# Patient Record
Sex: Female | Born: 1950 | Race: Black or African American | Hispanic: No | Smoking: Never smoker
Health system: Southern US, Community
[De-identification: ages and names within clinical notes are randomized; demographics above are authoritative.]

## PROBLEM LIST (undated history)

## (undated) DIAGNOSIS — I2510 Atherosclerotic heart disease of native coronary artery without angina pectoris: Secondary | ICD-10-CM

## (undated) DIAGNOSIS — M797 Fibromyalgia: Secondary | ICD-10-CM

## (undated) DIAGNOSIS — F329 Major depressive disorder, single episode, unspecified: Secondary | ICD-10-CM

## (undated) DIAGNOSIS — R42 Dizziness and giddiness: Secondary | ICD-10-CM

## (undated) DIAGNOSIS — N179 Acute kidney failure, unspecified: Secondary | ICD-10-CM

## (undated) DIAGNOSIS — E785 Hyperlipidemia, unspecified: Secondary | ICD-10-CM

## (undated) DIAGNOSIS — R569 Unspecified convulsions: Secondary | ICD-10-CM

## (undated) DIAGNOSIS — Z87442 Personal history of urinary calculi: Secondary | ICD-10-CM

## (undated) DIAGNOSIS — F32A Depression, unspecified: Secondary | ICD-10-CM

## (undated) DIAGNOSIS — J189 Pneumonia, unspecified organism: Secondary | ICD-10-CM

## (undated) DIAGNOSIS — M1990 Unspecified osteoarthritis, unspecified site: Secondary | ICD-10-CM

## (undated) DIAGNOSIS — F319 Bipolar disorder, unspecified: Secondary | ICD-10-CM

## (undated) DIAGNOSIS — I10 Essential (primary) hypertension: Secondary | ICD-10-CM

## (undated) DIAGNOSIS — G4730 Sleep apnea, unspecified: Secondary | ICD-10-CM

## (undated) DIAGNOSIS — Z86018 Personal history of other benign neoplasm: Secondary | ICD-10-CM

## (undated) DIAGNOSIS — K219 Gastro-esophageal reflux disease without esophagitis: Secondary | ICD-10-CM

## (undated) DIAGNOSIS — G43909 Migraine, unspecified, not intractable, without status migrainosus: Secondary | ICD-10-CM

## (undated) DIAGNOSIS — I739 Peripheral vascular disease, unspecified: Secondary | ICD-10-CM

## (undated) DIAGNOSIS — J45909 Unspecified asthma, uncomplicated: Secondary | ICD-10-CM

## (undated) DIAGNOSIS — C801 Malignant (primary) neoplasm, unspecified: Secondary | ICD-10-CM

## (undated) HISTORY — DX: Fibromyalgia: M79.7

## (undated) HISTORY — DX: Essential (primary) hypertension: I10

## (undated) HISTORY — DX: Depression, unspecified: F32.A

## (undated) HISTORY — DX: Hyperlipidemia, unspecified: E78.5

## (undated) HISTORY — PX: TOTAL KNEE ARTHROPLASTY: SHX125

## (undated) HISTORY — DX: Major depressive disorder, single episode, unspecified: F32.9

## (undated) HISTORY — DX: Atherosclerotic heart disease of native coronary artery without angina pectoris: I25.10

## (undated) HISTORY — PX: CORONARY ANGIOPLASTY: SHX604

## (undated) HISTORY — DX: Morbid (severe) obesity due to excess calories: E66.01

## (undated) HISTORY — DX: Unspecified asthma, uncomplicated: J45.909

## (undated) HISTORY — PX: TONSILLECTOMY: SUR1361

## (undated) HISTORY — PX: PARTIAL HYSTERECTOMY: SHX80

## (undated) HISTORY — PX: ANKLE FUSION: SHX881

## (undated) HISTORY — DX: Gastro-esophageal reflux disease without esophagitis: K21.9

## (undated) HISTORY — DX: Unspecified osteoarthritis, unspecified site: M19.90

---

## 1997-08-28 HISTORY — PX: OTHER SURGICAL HISTORY: SHX169

## 2010-05-10 DIAGNOSIS — B372 Candidiasis of skin and nail: Secondary | ICD-10-CM | POA: Insufficient documentation

## 2012-08-28 HISTORY — PX: EXCISION / CURETTAGE BONE CYST PHALANGES OF FOOT: SUR479

## 2013-06-10 LAB — HM COLONOSCOPY

## 2015-08-29 HISTORY — PX: OTHER SURGICAL HISTORY: SHX169

## 2015-09-29 LAB — HM HEPATITIS C SCREENING LAB: HM HEPATITIS C SCREENING: NEGATIVE

## 2016-07-07 DIAGNOSIS — I2510 Atherosclerotic heart disease of native coronary artery without angina pectoris: Secondary | ICD-10-CM

## 2016-07-07 HISTORY — DX: Atherosclerotic heart disease of native coronary artery without angina pectoris: I25.10

## 2016-09-25 ENCOUNTER — Encounter: Payer: Self-pay | Admitting: Family Medicine

## 2016-09-25 ENCOUNTER — Ambulatory Visit (INDEPENDENT_AMBULATORY_CARE_PROVIDER_SITE_OTHER): Payer: Medicare Other | Admitting: Family Medicine

## 2016-09-25 VITALS — BP 122/78 | HR 70 | Temp 98.2°F | Ht 66.0 in | Wt 297.8 lb

## 2016-09-25 DIAGNOSIS — G894 Chronic pain syndrome: Secondary | ICD-10-CM

## 2016-09-25 DIAGNOSIS — N3281 Overactive bladder: Secondary | ICD-10-CM

## 2016-09-25 DIAGNOSIS — D352 Benign neoplasm of pituitary gland: Secondary | ICD-10-CM | POA: Diagnosis not present

## 2016-09-25 DIAGNOSIS — I10 Essential (primary) hypertension: Secondary | ICD-10-CM | POA: Insufficient documentation

## 2016-09-25 DIAGNOSIS — R079 Chest pain, unspecified: Secondary | ICD-10-CM

## 2016-09-25 DIAGNOSIS — H547 Unspecified visual loss: Secondary | ICD-10-CM | POA: Diagnosis not present

## 2016-09-25 DIAGNOSIS — K219 Gastro-esophageal reflux disease without esophagitis: Secondary | ICD-10-CM | POA: Insufficient documentation

## 2016-09-25 DIAGNOSIS — Z86018 Personal history of other benign neoplasm: Secondary | ICD-10-CM | POA: Insufficient documentation

## 2016-09-25 DIAGNOSIS — D353 Benign neoplasm of craniopharyngeal duct: Secondary | ICD-10-CM

## 2016-09-25 DIAGNOSIS — L4050 Arthropathic psoriasis, unspecified: Secondary | ICD-10-CM

## 2016-09-25 DIAGNOSIS — Z8639 Personal history of other endocrine, nutritional and metabolic disease: Secondary | ICD-10-CM

## 2016-09-25 DIAGNOSIS — F341 Dysthymic disorder: Secondary | ICD-10-CM | POA: Insufficient documentation

## 2016-09-25 DIAGNOSIS — J4540 Moderate persistent asthma, uncomplicated: Secondary | ICD-10-CM

## 2016-09-25 DIAGNOSIS — J301 Allergic rhinitis due to pollen: Secondary | ICD-10-CM | POA: Diagnosis not present

## 2016-09-25 DIAGNOSIS — N3941 Urge incontinence: Secondary | ICD-10-CM | POA: Insufficient documentation

## 2016-09-25 MED ORDER — OXYCODONE-ACETAMINOPHEN 5-325 MG PO TABS: 1.0000 | ORAL_TABLET | Freq: Three times a day (TID) | ORAL | 0 refills | Status: DC | PRN

## 2016-09-25 MED ORDER — MORPHINE SULFATE ER 15 MG PO TBCR: 15.0000 mg | EXTENDED_RELEASE_TABLET | Freq: Three times a day (TID) | ORAL | 0 refills | Status: DC

## 2016-09-25 NOTE — Progress Notes (Signed)
Pre visit review using our clinic review tool, if applicable. No additional management support is needed unless otherwise documented below in the visit note. 

## 2016-09-25 NOTE — Assessment & Plan Note (Signed)
Previously followed by Neurology. Needs new referral. Due for MRI per patient. Hx of frequent headaches secondary to above.

## 2016-09-25 NOTE — Progress Notes (Signed)
Al[place] [name] is a 66 y.o. female is here to [facility].   History of Present Illness:   Patient recently moved from [name] to live with her youngest daughter, rather than go to ALF. She has basic records with her today, including a MAR, and DC Summary from a hospital stay in 12/18. She states that she needs all of her medications refilled, along with multiple referrals completed.  She has been doing well since living with her daughter and is optimistic that it was a good move.    1. Chest pain, atypical. Intermittent. Anterior, without radiation. Not exertional. Patient does endorse SOB with exertion and brings in request for Cardiac Rehab referral. States that she has been diagnosed with "mini heart attacks." No cardiologist.    2. Chronic pain syndrome    3. Morbid obesity ([place])    4. 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 Followed by Neurology. Due for repeat MRI per patient.    5. Essential hypertension    6. Arthritis with psoriasis ([name])    7. Vision impairment    8. Dysthymia    9. Chronic seasonal allergic rhinitis due to pollen    10. Moderate persistent asthma without complication    11. Gastroesophageal reflux disease without esophagitis    12. OAB (overactive bladder)       PMHx, SurgHx, SocialHx, Medications, and Allergies were reviewed in the Visit Navigator and updated as appropriate.    Past Medical History:  Diagnosis Date  . Allergy   . Arthritis   . Asthma   . Depression   . GERD (gastroesophageal reflux disease)   . Hyperlipidemia   . Hypertension     No past surgical history on file.  No family history on file.  Social History  Substance Use Topics  . Smoking status: Never Smoker  . Smokeless tobacco: Never Used  . Alcohol use Yes     Comment: Socially. [name].     Current Medications and Allergies:    Current Outpatient Prescriptions:  .  albuterol (PROVENTIL  HFA;VENTOLIN HFA) 108 (90 Base) MCG/ACT inhaler, Inhale 2 puffs into the lungs every 6 (six) hours as needed for wheezing or shortness of breath., Disp: , Rfl:  .  amLODipine (NORVASC) 10 MG tablet, Take 10 mg by mouth daily., Disp: , Rfl:  .  ammonium lactate (LAC-HYDRIN) 12 % lotion, Apply 1 application topically as needed for dry skin., Disp: , Rfl:  .  aspirin EC 81 MG tablet, Take 81 mg by mouth daily., Disp: , Rfl:  .  budesonide-formoterol (SYMBICORT) 160-4.5 MCG/ACT inhaler, Inhale 2 puffs into the lungs 2 (two) times daily., Disp: , Rfl:  .  Cholecalciferol 2000 units CAPS, Take by mouth., Disp: , Rfl:  .  Diclofenac Sodium 1.5 % SOLN, Place onto the skin., Disp: , Rfl:  .  furosemide (LASIX) 20 MG tablet, Take 20 mg by mouth daily., Disp: , Rfl:  .  gabapentin (NEURONTIN) 600 MG tablet, Take 600 mg by mouth 3 (three) times daily., Disp: , Rfl:  .  guaiFENesin (MUCINEX) 600 MG 12 hr tablet, Take 600 mg by mouth 2 (two) times daily., Disp: , Rfl:  .  leflunomide (ARAVA) 20 MG tablet, Take 20 mg by mouth daily., Disp: , Rfl:  .  lisinopril (PRINIVIL,ZESTRIL) 40 MG tablet, Take 40 mg by mouth daily., Disp: , Rfl:  .  loratadine (CLARITIN) 10 MG tablet, Take 10 mg by mouth daily., Disp: ,  Rfl:  .  meloxicam (MOBIC) 15 MG tablet, Take 15 mg by mouth daily., Disp: , Rfl:  .  metoprolol succinate (TOPROL-XL) 50 MG 24 hr tablet, Take 50 mg by mouth daily. Take with or immediately following a meal., Disp: , Rfl:  .  mirabegron ER (MYRBETRIQ) 25 MG TB24 tablet, Take 25 mg by mouth daily., Disp: , Rfl:  .  morphine (MS CONTIN) 15 MG 12 hr tablet, Take 1 tablet (15 mg total) by mouth 3 (three) times daily., Disp: 90 tablet, Rfl: 0 .  oxyCODONE-acetaminophen (ROXICET) 5-325 MG tablet, Take 1 tablet by mouth every 8 (eight) hours as needed for severe pain., Disp: 90 tablet, Rfl: 0 .  predniSONE (DELTASONE) 5 MG tablet, Take 5 mg by mouth daily with breakfast., Disp: , Rfl:  .  sertraline (ZOLOFT) 25 MG  tablet, Take 25 mg by mouth daily., Disp: , Rfl:  .  ticagrelor (BRILINTA) 90 MG TABS tablet, Take by mouth 2 (two) times daily., Disp: , Rfl:  .  triamcinolone ointment (KENALOG) 0.1 %, Apply 1 application topically 2 (two) times daily., Disp: , Rfl:    Allergies  Allergen Reactions  . Ampicillin Nausea Only  . Asa [Aspirin] Diarrhea  . Darvon [Propoxyphene] Nausea And Vomiting  . Salicylates Other (See Comments)    Unknown   . Tetracyclines & Related Other (See Comments)    Nerves feeling       Patient Information Form: Screening and ROS     Do you feel safe in relationships? yes PHQ-2: negative  Review of Systems  General:  Negative for nexplained weight loss, fever Skin: Negative for new or changing mole, sore that won't heal HEENT: Negative for trouble hearing, trouble seeing, ringing in ears, mouth sores, hoarseness, change in voice, dysphagia CV:  Negative for chest pain, dyspnea, edema, palpitations Resp: Negative for cough, dyspnea, hemoptysis GI: Negative for nausea, vomiting, diarrhea, constipation, abdominal pain, melena, hematochezia GU: Negative for dysuria, incontinence, urinary hesitance, hematuria, vaginal or penile discharge, polyuria, sexual difficulty, lumps in testicle or breasts MSK: Negative for muscle cramps, joint swelling Neuro: Negative for weakness, numbness, dizziness, passing out/fainting Psych: Negative for depression, anxiety, memory problems   Vitals:   Vitals:   09/25/16 0751  BP: 122/78  Pulse: 70  Temp: 98.2 F (36.8 C)  TempSrc: Oral  SpO2: 94%  Weight: 297 lb 12.8 oz (135.1 kg)  Height: 5\' 6"  (1.676 m)     Body mass index is 48.07 kg/m.   Physical Exam:    General: Alert, cooperative, appears younger than stated age and no distress.  HEENT:  Normocephalic, without obvious abnormality, atraumatic. Conjunctivae/corneas clear. PERRL, EOM's intact. Normal TM's and external ear canals both ears. Nares normal. Septum  midline. Mucosa normal. No drainage or sinus tenderness. Lips, mucosa, and tongue normal; teeth and gums normal.  Lungs: Clear to auscultation bilaterally.  Heart:: Regular rate and rhythm, S1, S2 normal, no murmur, click, rub or gallop.  Abdomen: Soft, non-tender; bowel sounds normal; no masses,  no organomegaly.  Extremities: Extremities normal, atraumatic, no cyanosis or edema.  Pulses: 2+ and symmetric.  Skin: Skin color, texture, turgor normal. No rashes or lesions.  Neurologic: Alert and oriented X 3, normal strength and tone. Normal symmetric. reflexes. Normal coordination and gait.  Psych: Alert,oriented, in NAD with a full range of affect, normal behavior and no psychotic features       Assessment and Plan:    Taea was seen today for establish care.  Diagnoses  and all orders for this visit:  Chest pain, unspecified type Comments: EKG reassuring. Patient taking appropriate medications. Will refer to Cardiac Rehab and Cardiology once records reviewed.  Orders: -     EKG 12-Lead  Chronic pain syndrome Comments: Okay refill today as patient presents with records.  Orders: -     oxyCODONE-acetaminophen (ROXICET) 5-325 MG tablet; Take 1 tablet by mouth every 8 (eight) hours as needed for severe pain. -     morphine (MS CONTIN) 15 MG 12 hr tablet; Take 1 tablet (15 mg total) by mouth 3 (three) times daily.  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 Orders: -     Ambulatory referral to Neurology  Essential hypertension  Arthritis with psoriasis [facility]) Comments: Referral to Rheumatology. Orders: -     Ambulatory referral to Rheumatology  Vision impairment  Dysthymia  Chronic seasonal allergic rhinitis due to pollen  Moderate persistent asthma without complication  Gastroesophageal reflux disease without esophagitis  OAB (overactive bladder)   . Reviewed expectations re:  course of current medical issues. . Discussed self-management of symptoms. . Outlined signs and symptoms indicating need for more acute intervention. . Patient verbalized understanding and all questions were answered. . See orders for this visit as documented in the electronic medical record. . Patient received an After Visit Summary.   Records requested if needed. I spent 60 minutes with this patient, greater than 50% was face-to-face time counseling regarding the above diagnoses.     Briscoe Deutscher, [place], [place] 09/25/2016   Follow-up: No Follow-up on file.  Meds ordered this encounter  Medications  . oxyCODONE-acetaminophen (ROXICET) 5-325 MG tablet    Sig: Take 1 tablet by mouth every 8 (eight) hours as needed for severe pain.    Dispense:  90 tablet    Refill:  0  . morphine (MS CONTIN) 15 MG 12 hr tablet    Sig: Take 1 tablet (15 mg total) by mouth 3 (three) times daily.    Dispense:  90 tablet    Refill:  0  . guaiFENesin (MUCINEX) 600 MG 12 hr tablet    Sig: Take 600 mg by mouth 2 (two) times daily.  . sertraline (ZOLOFT) 25 MG tablet    Sig: Take 25 mg by mouth daily.  Marland Kitchen loratadine (CLARITIN) 10 MG tablet    Sig: Take 10 mg by mouth daily.  Marland Kitchen triamcinolone ointment (KENALOG) 0.1 %    Sig: Apply 1 application topically 2 (two) times daily.  . meloxicam (MOBIC) 15 MG tablet    Sig: Take 15 mg by mouth daily.  Marland Kitchen leflunomide (ARAVA) 20 MG tablet    Sig: Take 20 mg by mouth daily.  Marland Kitchen gabapentin (NEURONTIN) 600 MG tablet    Sig: Take 600 mg by mouth 3 (three) times daily.  . furosemide (LASIX) 20 MG tablet    Sig: Take 20 mg by mouth daily.  . mirabegron ER (MYRBETRIQ) 25 MG TB24 tablet    Sig: Take 25 mg by mouth daily.  . budesonide-formoterol (SYMBICORT) 160-4.5 MCG/ACT inhaler    Sig: Inhale 2 puffs into the lungs 2 (two) times daily.  Marland Kitchen ammonium lactate (LAC-HYDRIN) 12 % lotion    Sig: Apply 1 application topically as needed for dry skin.  .  Cholecalciferol 2000 units CAPS    Sig: Take by mouth.  Marland Kitchen lisinopril (PRINIVIL,ZESTRIL) 40 MG tablet    Sig: Take 40 mg by  mouth daily.  . ticagrelor (BRILINTA) 90 MG TABS tablet    Sig: Take by mouth 2 (two) times daily.  Marland Kitchen amLODipine (NORVASC) 10 MG tablet    Sig: Take 10 mg by mouth daily.  . metoprolol succinate (TOPROL-XL) 50 MG 24 hr tablet    Sig: Take 50 mg by mouth daily. Take with or immediately following a meal.  . aspirin EC 81 MG tablet    Sig: Take 81 mg by mouth daily.  . Diclofenac Sodium 1.5 % SOLN    Sig: Place onto the skin.  . predniSONE (DELTASONE) 5 MG tablet    Sig: Take 5 mg by mouth daily with breakfast.  . albuterol (PROVENTIL HFA;VENTOLIN HFA) 108 (90 Base) MCG/ACT inhaler    Sig: Inhale 2 puffs into the lungs every 6 (six) hours as needed for wheezing or shortness of breath.   There are no discontinued medications. Orders Placed This Encounter  Procedures  . Ambulatory referral to Neurology  . Ambulatory referral to Rheumatology  . EKG 12-Lead

## 2016-09-28 ENCOUNTER — Telehealth: Payer: Self-pay | Admitting: Family Medicine

## 2016-09-28 NOTE — Telephone Encounter (Signed)
Spoke with patient and advised that referral has been placed.

## 2016-09-28 NOTE — Telephone Encounter (Signed)
Did you place referral for Cardiology?

## 2016-09-28 NOTE — Addendum Note (Signed)
Addended by: Briscoe Deutscher R on: 09/28/2016 11:55 AM   Modules accepted: Orders

## 2016-09-28 NOTE — Telephone Encounter (Signed)
Patient called to ask if the cardiac referral had been placed? I did not see one in the referrals tab. Please call patient to follow up on referral, she seemed anxious. She did not have a doctor in mind "she trusts Juleen China with choosing who and where."   Thank you!

## 2016-09-28 NOTE — Telephone Encounter (Signed)
I was hoping to have records before sending, but realize now that they may take too long. Referral in.

## 2016-09-28 NOTE — Addendum Note (Signed)
Addended by: Briscoe Deutscher R on: 09/28/2016 11:52 AM   Modules accepted: Orders

## 2016-10-02 ENCOUNTER — Encounter: Payer: Self-pay | Admitting: Surgical

## 2016-10-03 ENCOUNTER — Encounter: Payer: Self-pay | Admitting: Cardiology

## 2016-10-03 ENCOUNTER — Ambulatory Visit (INDEPENDENT_AMBULATORY_CARE_PROVIDER_SITE_OTHER): Payer: Medicare Other | Admitting: Cardiology

## 2016-10-03 VITALS — BP 122/65 | HR 67 | Ht 66.0 in | Wt 308.6 lb

## 2016-10-03 DIAGNOSIS — I209 Angina pectoris, unspecified: Secondary | ICD-10-CM | POA: Diagnosis not present

## 2016-10-03 DIAGNOSIS — I10 Essential (primary) hypertension: Secondary | ICD-10-CM

## 2016-10-03 DIAGNOSIS — I25119 Atherosclerotic heart disease of native coronary artery with unspecified angina pectoris: Secondary | ICD-10-CM

## 2016-10-03 MED ORDER — ISOSORBIDE MONONITRATE ER 30 MG PO TB24: 30.0000 mg | ORAL_TABLET | Freq: Every day | ORAL | 3 refills | Status: DC

## 2016-10-03 MED ORDER — NITROGLYCERIN 0.4 MG SL SUBL: 0.4000 mg | SUBLINGUAL_TABLET | SUBLINGUAL | 3 refills | Status: DC | PRN

## 2016-10-03 NOTE — Progress Notes (Signed)
Cardiology Office Note   Date:  10/04/2016   ID:  Alisha [name], DOB 1950-09-16, MRN [id number]  PCP:  Alisha Deutscher, DO  Cardiologist:   Alisha Breeding, MD  Referring:  Alisha Deutscher, DO  Chief Complaint  Patient presents with  . Chest Pain      History of Present Illness: Alisha [name] is a 66 y.o. female who presents for evaluation of coronary disease. She is just moved here from [place]. She reports that in late October she presented with chest pain and received 2 stents. Unfortunately I don't have any of these records. She says that over the last few weeks she has had some arm discomfort. This is on the right arm and right sided. This happens at rest. She doesn't think it's similar to her fibromyalgia but she has a hard time telling the difference sometimes. She thinks it might be somewhat similar to some of the discomfort and related to her heart. She says it happens at rest because she's relatively sedentary. She can't make it happen. He goes away after several minutes on its own. She doesn't take anything for it. She might have some associated shortness of breath. It is moderate in intensity. She says it happens sporadically and not daily and she thinks it is a stable pattern over the last few weeks. She denies any PND or orthopnea. She said the palpitations, presyncope or syncope. She is limited by back pain.  Past Medical History:  Diagnosis Date  . Arthritis   . Asthma   . CAD (coronary artery disease)   . Depression   . GERD (gastroesophageal reflux disease)   . Hyperlipidemia   . Hypertension     Past Surgical History:  Procedure Laterality Date  . carpel tunnel release Bilateral 2017  . EXCISION / CURETTAGE BONE CYST PHALANGES OF FOOT  2014   Removal of foot cyst   . PARTIAL HYSTERECTOMY  unknown   Patient still has ovaries  . pituitary tumor removal  1999  . TONSILLECTOMY    . TOTAL KNEE ARTHROPLASTY     TKR X 3     Current Outpatient Prescriptions    Medication Sig Dispense Refill  . albuterol (PROVENTIL HFA;VENTOLIN HFA) 108 (90 Base) MCG/ACT inhaler Inhale 2 puffs into the lungs every 6 (six) hours as needed for wheezing or shortness of breath.    Marland Kitchen amLODipine (NORVASC) 10 MG tablet Take 10 mg by mouth daily.    Marland Kitchen ammonium lactate (LAC-HYDRIN) 12 % lotion Apply 1 application topically as needed for dry skin.    Marland Kitchen aspirin EC 81 MG tablet Take 81 mg by mouth daily.    . budesonide-formoterol (SYMBICORT) 160-4.5 MCG/ACT inhaler Inhale 2 puffs into the lungs 2 (two) times daily.    . Cholecalciferol 2000 units CAPS Take by mouth.    . Diclofenac Sodium 1.5 % SOLN Place onto the skin.    . furosemide (LASIX) 20 MG tablet Take 20 mg by mouth daily.    Marland Kitchen gabapentin (NEURONTIN) 600 MG tablet Take 600 mg by mouth 3 (three) times daily.    Marland Kitchen guaiFENesin (MUCINEX) 600 MG 12 hr tablet Take 600 mg by mouth 2 (two) times daily.    Marland Kitchen leflunomide (ARAVA) 20 MG tablet Take 20 mg by mouth daily.    Marland Kitchen lisinopril (PRINIVIL,ZESTRIL) 40 MG tablet Take 40 mg by mouth daily.    Marland Kitchen loratadine (CLARITIN) 10 MG tablet Take 10 mg by mouth daily.    . meloxicam (MOBIC)  15 MG tablet Take 15 mg by mouth daily.    . metoprolol succinate (TOPROL-XL) 50 MG 24 hr tablet Take 50 mg by mouth daily. Take with or immediately following a meal.    . mirabegron ER (MYRBETRIQ) 25 MG TB24 tablet Take 25 mg by mouth daily.    Marland Kitchen morphine (MS CONTIN) 15 MG 12 hr tablet Take 1 tablet (15 mg total) by mouth 3 (three) times daily. 90 tablet 0  . oxyCODONE-acetaminophen (ROXICET) 5-325 MG tablet Take 1 tablet by mouth every 8 (eight) hours as needed for severe pain. 90 tablet 0  . sertraline (ZOLOFT) 25 MG tablet Take 25 mg by mouth daily.    . ticagrelor (BRILINTA) 90 MG TABS tablet Take by mouth 2 (two) times daily.    Marland Kitchen triamcinolone ointment (KENALOG) 0.1 % Apply 1 application topically 2 (two) times daily.    . isosorbide mononitrate (IMDUR) 30 MG 24 hr tablet Take 1 tablet (30 mg  total) by mouth daily. 90 tablet 3  . nitroGLYCERIN (NITROSTAT) 0.4 MG SL tablet Place 1 tablet (0.4 mg total) under the tongue every 5 (five) minutes as needed for chest pain. 25 tablet 3   No current facility-administered medications for this visit.     Allergies:   Ampicillin; Asa [aspirin]; Darvon [propoxyphene]; Salicylates; and Tetracyclines & related    Social History:  The patient  reports that she has never smoked. She has never used smokeless tobacco. She reports that she drinks alcohol. She reports that she does not use drugs.   Family History:  The patient's family history includes Cancer in her father; Heart disease in her sister; Heart disease (age of onset: 83) in her mother.    ROS:  Please see the history of present illness.   Otherwise, review of systems are positive for vertigo.   All other systems are reviewed and negative.    PHYSICAL EXAM: VS:  BP 122/65   Pulse 67   Ht 5\' 6"  (1.676 m)   Wt (!) 308 lb 9.6 oz (140 kg)   SpO2 98%   BMI 49.81 kg/m  , BMI Body mass index is 49.81 kg/m. GENERAL:  Well appearing HEENT:  Pupils equal round and reactive, fundi not visualized, oral mucosa unremarkable, few bottom teeth NECK:  No jugular venous distention, waveform within normal limits, carotid upstroke brisk and symmetric, no bruits, no thyromegaly LYMPHATICS:  No cervical, inguinal adenopathy LUNGS:  Clear to auscultation bilaterally BACK:  No CVA tenderness CHEST:  Unremarkable HEART:  PMI not displaced or sustained,S1 and S2 within normal limits, no S3, no S4, no clicks, no rubs, no murmurs ABD:  Flat, positive bowel sounds normal in frequency in pitch, no bruits, no rebound, no guarding, no midline pulsatile mass, no hepatomegaly, no splenomegaly EXT:  2 plus pulses throughout, mild edema edema, no cyanosis no clubbing SKIN:  No rashes no nodules NEURO:  Cranial nerves II through XII grossly intact, motor grossly intact throughout PSYCH:  Cognitively intact,  oriented to person place and time    EKG:  EKG is ordered today. The ekg ordered today demonstrates sinus rhythm, rate 67, axis within normal limits, poor anterior R-wave progression, no acute ST-T wave changes.   Recent Labs: No results found for requested labs within last 8760 hours.    Lipid Panel No results found for: CHOL, TRIG, HDL, CHOLHDL, VLDL, LDLCALC, LDLDIRECT    Wt Readings from Last 3 Encounters:  10/03/16 (!) 308 lb 9.6 oz (140 kg)  09/25/16 297 lb 12.8 oz (135.1 kg)      Other studies Reviewed: Additional studies/ records that were reviewed today include: None. Review of the above records demonstrates:  Please see elsewhere in the note.     ASSESSMENT AND PLAN:  CAD:   The patient has coronary disease but I need to get the records. She is on appropriate medications at this point. I'm going to start Imdur although I'm not convinced that her pain is coming from her heart. She was also given sublingual nitroglycerin and instruction on the use of this as well as potential need to present to the emergency room should she have any increasing symptoms. Further evaluation will be based on ongoing symptoms and review her records. Of note she wants to be referred to cardiac rehabilitation.    HTN:  The blood pressure is at target. No change in medications is indicated. We will continue with therapeutic lifestyle changes (TLC).  HYPERLIPIDEMIA:  I do not have recent lipids. She is not on a statin. Again I review the records and likely will start a statin.     Current medicines are reviewed at length with the patient today.  The patient does not have concerns regarding medicines.  The following changes have been made:  As above  Labs/ tests ordered today include:   Orders Placed This Encounter  Procedures  . AMB referral to cardiac rehabilitation  . EKG 12-Lead     Disposition:   FU with me or APP in one week.     Signed, Alisha Breeding, MD  10/04/2016 1:20 PM     [facility]

## 2016-10-03 NOTE — Patient Instructions (Addendum)
Medication Instructions:  START- Imdur 30 mg daily and take Nitroglycerin as needed for chest pain not exceeding three tablets daily  Labwork: None Ordered  Testing/Procedures: None Ordered  Follow-Up:  You have been referred to Cardiac Rehabilitation  Your physician recommends that you schedule a follow-up appointment in: 1 Week with APP or Dr Percival Spanish   Any Other Special Instructions Will Be Listed Below (If Applicable).   If you need a refill on your cardiac medications before your next appointment, please call your pharmacy.

## 2016-10-04 ENCOUNTER — Encounter: Payer: Self-pay | Admitting: Cardiology

## 2016-10-04 ENCOUNTER — Encounter: Payer: Self-pay | Admitting: Family Medicine

## 2016-10-04 DIAGNOSIS — K089 Disorder of teeth and supporting structures, unspecified: Secondary | ICD-10-CM

## 2016-10-04 DIAGNOSIS — Z87442 Personal history of urinary calculi: Secondary | ICD-10-CM

## 2016-10-04 DIAGNOSIS — Z86718 Personal history of other venous thrombosis and embolism: Secondary | ICD-10-CM

## 2016-10-04 DIAGNOSIS — G894 Chronic pain syndrome: Secondary | ICD-10-CM

## 2016-10-04 DIAGNOSIS — G4733 Obstructive sleep apnea (adult) (pediatric): Secondary | ICD-10-CM

## 2016-10-04 DIAGNOSIS — I878 Other specified disorders of veins: Secondary | ICD-10-CM | POA: Insufficient documentation

## 2016-10-04 DIAGNOSIS — Z872 Personal history of diseases of the skin and subcutaneous tissue: Secondary | ICD-10-CM

## 2016-10-04 DIAGNOSIS — F319 Bipolar disorder, unspecified: Secondary | ICD-10-CM

## 2016-10-06 ENCOUNTER — Telehealth: Payer: Self-pay | Admitting: Family Medicine

## 2016-10-06 ENCOUNTER — Telehealth: Payer: Self-pay | Admitting: Cardiology

## 2016-10-06 MED ORDER — FUROSEMIDE 20 MG PO TABS: 20.0000 mg | ORAL_TABLET | Freq: Every day | ORAL | 3 refills | Status: DC

## 2016-10-06 MED ORDER — GABAPENTIN 600 MG PO TABS: 600.0000 mg | ORAL_TABLET | Freq: Three times a day (TID) | ORAL | 3 refills | Status: DC

## 2016-10-06 MED ORDER — TICAGRELOR 90 MG PO TABS: 90.0000 mg | ORAL_TABLET | Freq: Two times a day (BID) | ORAL | 1 refills | Status: DC

## 2016-10-06 MED ORDER — ASPIRIN EC 81 MG PO TBEC: 81.0000 mg | DELAYED_RELEASE_TABLET | Freq: Every day | ORAL | 6 refills | Status: DC

## 2016-10-06 MED ORDER — SERTRALINE HCL 25 MG PO TABS: 25.0000 mg | ORAL_TABLET | Freq: Every day | ORAL | 2 refills | Status: DC

## 2016-10-06 MED ORDER — METOPROLOL SUCCINATE ER 50 MG PO TB24: 50.0000 mg | ORAL_TABLET | Freq: Every day | ORAL | 3 refills | Status: DC

## 2016-10-06 MED ORDER — MIRABEGRON ER 25 MG PO TB24: 25.0000 mg | ORAL_TABLET | Freq: Every day | ORAL | 2 refills | Status: DC

## 2016-10-06 MED ORDER — AMMONIUM LACTATE 12 % EX LOTN: 1.0000 "application " | TOPICAL_LOTION | CUTANEOUS | 1 refills | Status: DC | PRN

## 2016-10-06 MED ORDER — BUDESONIDE-FORMOTEROL FUMARATE 160-4.5 MCG/ACT IN AERO: 2.0000 | INHALATION_SPRAY | Freq: Two times a day (BID) | RESPIRATORY_TRACT | 6 refills | Status: DC

## 2016-10-06 MED ORDER — LISINOPRIL 40 MG PO TABS: 40.0000 mg | ORAL_TABLET | Freq: Every day | ORAL | 6 refills | Status: DC

## 2016-10-06 MED ORDER — LEFLUNOMIDE 20 MG PO TABS: 20.0000 mg | ORAL_TABLET | Freq: Every day | ORAL | 0 refills | Status: DC

## 2016-10-06 MED ORDER — AMLODIPINE BESYLATE 10 MG PO TABS: 10.0000 mg | ORAL_TABLET | Freq: Every day | ORAL | 6 refills | Status: DC

## 2016-10-06 MED ORDER — LORATADINE 10 MG PO TABS: 10.0000 mg | ORAL_TABLET | Freq: Every day | ORAL | 6 refills | Status: DC

## 2016-10-06 MED ORDER — ALBUTEROL SULFATE HFA 108 (90 BASE) MCG/ACT IN AERS: 2.0000 | INHALATION_SPRAY | Freq: Four times a day (QID) | RESPIRATORY_TRACT | 6 refills | Status: DC | PRN

## 2016-10-06 MED ORDER — MELOXICAM 15 MG PO TABS: 15.0000 mg | ORAL_TABLET | Freq: Every day | ORAL | 3 refills | Status: DC

## 2016-10-06 NOTE — Telephone Encounter (Signed)
New Message ° °Pt voiced wanting to speak with nurse. ° °Please f/u °

## 2016-10-06 NOTE — Telephone Encounter (Signed)
RX faxed to pharmacy.

## 2016-10-06 NOTE — Telephone Encounter (Signed)
Yes, we need to refill all medications except narcotics. Per protocol okay.

## 2016-10-06 NOTE — Telephone Encounter (Signed)
Returned call to patient-patient wondering if appt has been made for cardiac rehab yet.  Advised that Cardiac rehab should reach out to her soon and get her appointment set up.  Patient requesting # to call in case she doesn't hear from them.  # provided.  Pt aware and verbalized understanding.   Also verified appt for 2/14 at 9:30 with Ignacia Bayley NP

## 2016-10-06 NOTE — Telephone Encounter (Signed)
Patient called to let Dr. Juleen China know the pharmacy number for her medication.  [facility] on [address].  [phone]  Please call patient if you have any questions.   Thank you!

## 2016-10-06 NOTE — Telephone Encounter (Signed)
Ms Train called back with pharmacy. Do we need to send in any medications?

## 2016-10-06 NOTE — Addendum Note (Signed)
Addended by: Durwin Glaze on: 10/06/2016 03:37 PM   Modules accepted: Orders

## 2016-10-11 ENCOUNTER — Ambulatory Visit (INDEPENDENT_AMBULATORY_CARE_PROVIDER_SITE_OTHER): Payer: Medicare Other | Admitting: Nurse Practitioner

## 2016-10-11 ENCOUNTER — Encounter: Payer: Self-pay | Admitting: Nurse Practitioner

## 2016-10-11 ENCOUNTER — Encounter: Payer: Self-pay | Admitting: Internal Medicine

## 2016-10-11 VITALS — BP 145/78 | HR 65 | Ht 66.0 in | Wt 307.8 lb

## 2016-10-11 DIAGNOSIS — I10 Essential (primary) hypertension: Secondary | ICD-10-CM | POA: Diagnosis not present

## 2016-10-11 DIAGNOSIS — Z79899 Other long term (current) drug therapy: Secondary | ICD-10-CM | POA: Diagnosis not present

## 2016-10-11 DIAGNOSIS — I739 Peripheral vascular disease, unspecified: Secondary | ICD-10-CM

## 2016-10-11 DIAGNOSIS — I25119 Atherosclerotic heart disease of native coronary artery with unspecified angina pectoris: Secondary | ICD-10-CM

## 2016-10-11 DIAGNOSIS — R600 Localized edema: Secondary | ICD-10-CM | POA: Diagnosis not present

## 2016-10-11 DIAGNOSIS — I209 Angina pectoris, unspecified: Secondary | ICD-10-CM

## 2016-10-11 MED ORDER — FUROSEMIDE 20 MG PO TABS: 40.0000 mg | ORAL_TABLET | Freq: Every day | ORAL | 3 refills | Status: DC

## 2016-10-11 NOTE — Progress Notes (Signed)
Office Visit    Patient Name: Alisha [name] Date of Encounter: 10/11/2016  Primary Care Provider:  Briscoe Deutscher, DO Primary Cardiologist:  Lenna Sciara. Hochrein, MD   Chief Complaint    66 year old female with a history of CAD, hypertension, hyperlipidemia, obesity, and fibromyalgia, who presents for follow-up related to chest and arm pain.  Past Medical History    Past Medical History:  Diagnosis Date  . Arthritis   . Asthma   . CAD (coronary artery disease)    a. 05/2016 s/p stenting x 2 in [place] (records not available).  . Depression   . Fibromyalgia   . GERD (gastroesophageal reflux disease)   . Hyperlipidemia   . Hypertension   . Morbid obesity ([place])    Past Surgical History:  Procedure Laterality Date  . carpel tunnel release Bilateral 2017  . EXCISION / CURETTAGE BONE CYST PHALANGES OF FOOT  2014   Removal of foot cyst   . PARTIAL HYSTERECTOMY  unknown   Patient still has ovaries  . pituitary tumor removal  1999  . TONSILLECTOMY    . TOTAL KNEE ARTHROPLASTY     TKR X 3    Allergies  Allergies  Allergen Reactions  . Ampicillin Nausea Only  . Asa [Aspirin] Diarrhea  . Darvon [Propoxyphene] Nausea And Vomiting  . Salicylates Other (See Comments)    Unknown   . Tetracyclines & Related Other (See Comments)    Nerves feeling     History of Present Illness    66 year old female with the above complex past medical history. She reports a long history of chronic, somewhat diffuse pain in the setting of fibromyalgia and arthritis. In late October 2017, she had chest pain and was hospitalized in [place]. She reports stenting 2. Those records are not available to [place]. She has since moved to [facility] and saw Dr. Percival Spanish on February 6, in order to establish care. At the time, she reported intermittent right arm and left chest discomfort. She says today that that discomfort was occurring when she would use her upper body. It wasn't clear if symptoms were anginal or  not and she was placed on isosorbide mononitrate. She says that since that addition, she has noticed less frequent episodes of arm and chest pain. Of note, anginal equivalent was substernal chest heaviness associated with dyspnea. She has had nothing like that since her stents were placed in late October/early November. She has some degree of chronic dyspnea on exertion in the setting of obesity and sedentary lifestyle. She is interested in cardiac rehabilitation and referral was made at the time of her last visit. She also has chronic lower extremity swelling, which she feels is slightly worse over the past few weeks. She does spend some part of her day with her legs in a dependent position. Since moving with her daughter, her sodium intake has dropped significantly. She denies PND, orthopnea, dizziness, syncope, or early satiety.  She did mention today, that she has been having some cramping of bilateral calves with ambulation. She does not believe she has ever been tested for PAD before.  Home Medications    Prior to Admission medications   Medication Sig Start Date End Date Taking? Authorizing Provider  albuterol (PROVENTIL HFA;VENTOLIN HFA) 108 (90 Base) MCG/ACT inhaler Inhale 2 puffs into the lungs every 6 (six) hours as needed for wheezing or shortness of breath. 10/06/16  Yes Briscoe Deutscher, DO  amLODipine (NORVASC) 10 MG tablet Take 1 tablet (10 mg total) by  mouth daily. 10/06/16  Yes Briscoe Deutscher, DO  ammonium lactate (LAC-HYDRIN) 12 % lotion Apply 1 application topically as needed for dry skin. 10/06/16  Yes Briscoe Deutscher, DO  aspirin EC 81 MG tablet Take 1 tablet (81 mg total) by mouth daily. 10/06/16  Yes Briscoe Deutscher, DO  budesonide-formoterol (SYMBICORT) 160-4.5 MCG/ACT inhaler Inhale 2 puffs into the lungs 2 (two) times daily. 10/06/16  Yes Briscoe Deutscher, DO  Cholecalciferol 2000 units CAPS Take by mouth.   Yes Historical Provider, MD  Diclofenac Sodium 1.5 % SOLN Place onto the skin.   Yes  Historical Provider, MD  furosemide (LASIX) 20 MG tablet Take 2 tablets (40 mg total) by mouth daily. 10/11/16  Yes Rogelia Mire, NP  gabapentin (NEURONTIN) 600 MG tablet Take 1 tablet (600 mg total) by mouth 3 (three) times daily. 10/06/16  Yes Briscoe Deutscher, DO  guaiFENesin (MUCINEX) 600 MG 12 hr tablet Take 600 mg by mouth 2 (two) times daily.   Yes Historical Provider, MD  isosorbide mononitrate (IMDUR) 30 MG 24 hr tablet Take 1 tablet (30 mg total) by mouth daily. 10/03/16 01/01/17 Yes Minus Breeding, MD  leflunomide (ARAVA) 20 MG tablet Take 1 tablet (20 mg total) by mouth daily. 10/06/16  Yes Briscoe Deutscher, DO  lisinopril (PRINIVIL,ZESTRIL) 40 MG tablet Take 1 tablet (40 mg total) by mouth daily. 10/06/16  Yes Briscoe Deutscher, DO  loratadine (CLARITIN) 10 MG tablet Take 1 tablet (10 mg total) by mouth daily. 10/06/16  Yes Briscoe Deutscher, DO  meloxicam (MOBIC) 15 MG tablet Take 1 tablet (15 mg total) by mouth daily. 10/06/16  Yes Briscoe Deutscher, DO  metoprolol succinate (TOPROL-XL) 50 MG 24 hr tablet Take 1 tablet (50 mg total) by mouth daily. Take with or immediately following a meal. 10/06/16  Yes Briscoe Deutscher, DO  mirabegron ER (MYRBETRIQ) 25 MG TB24 tablet Take 1 tablet (25 mg total) by mouth daily. 10/06/16  Yes Briscoe Deutscher, DO  morphine (MS CONTIN) 15 MG 12 hr tablet Take 1 tablet (15 mg total) by mouth 3 (three) times daily. 09/25/16  Yes Briscoe Deutscher, DO  nitroGLYCERIN (NITROSTAT) 0.4 MG SL tablet Place 1 tablet (0.4 mg total) under the tongue every 5 (five) minutes as needed for chest pain. 10/03/16 01/01/17 Yes Minus Breeding, MD  oxyCODONE-acetaminophen (ROXICET) 5-325 MG tablet Take 1 tablet by mouth every 8 (eight) hours as needed for severe pain. 09/25/16  Yes Briscoe Deutscher, DO  sertraline (ZOLOFT) 25 MG tablet Take 1 tablet (25 mg total) by mouth daily. 10/06/16  Yes Briscoe Deutscher, DO  ticagrelor (BRILINTA) 90 MG TABS tablet Take 1 tablet (90 mg total) by mouth 2 (two) times daily. 10/06/16  Yes Briscoe Deutscher, DO  triamcinolone ointment (KENALOG) 0.1 % Apply 1 application topically 2 (two) times daily.   Yes Historical Provider, MD    Review of Systems    As above, some reduction in chest and arm pain symptoms since the addition of isosorbide mononitrate. She has some degree of chronic dyspnea on exertion which is unchanged. She also has some degree of chronic lower extremity swelling which is slightly worse. She denies PND, orthopnea, palpitations, dizziness, syncope, or early satiety.  All other systems reviewed and are otherwise negative except as noted above.  Physical Exam    VS:  BP (!) 145/78   Pulse 65   Ht 5\' 6"  (1.676 m)   Wt (!) 307 lb 12.8 oz (139.6 kg)   BMI 49.68 kg/m  , BMI  Body mass index is 49.68 kg/m. GEN: Well nourished, well developed, in no acute distress.  HEENT: normal.  Neck: Supple, no JVD, carotid bruits, or masses. Cardiac: RRR, no murmurs, rubs, or gallops. No clubbing, cyanosis, 2+ bilateral lower extremity edema to the midcalf.  Radials/DP/PT 2+ and equal bilaterally.  Respiratory:  Respirations regular and unlabored, clear to auscultation bilaterally. GI: Obese, Soft, nontender, nondistended, BS + x 4. MS: no deformity or atrophy. Skin: warm and dry, no rash. Neuro:  Strength and sensation are intact. Psych: Normal affect.  Accessory Clinical Findings    Basic metabolic panel pending  Assessment & Plan    1.  Coronary artery disease/atypical chest pain: Patient status post reported stenting in late October/early November 2017 in [place]. Those records are not currently available. Anginal equivalent at that time with substernal chest heaviness and pressure associated with dyspnea. She has not had any symptoms like that, but was experiencing intermittent, somewhat fleeting right arm, occasionally left arm, and occasionally left upper chest discomfort without associated symptoms. She was placed on isosorbide mononitrate last week and has noted some  improvement in the symptoms. She remains on aspirin, beta blocker, ACE inhibitor, Brilinta, calcium channel blocker. She is not currently on a statin and it is not clear as to why. We are awaiting records from [place]. In light of atypical symptoms, I would not pursue any additional ischemic evaluation at this time.  2. Essential hypertension: Blood pressure is mildly elevated today. In the setting of slightly worsening lower extremity swelling, I'm going to increase her Lasix to 40 mg daily. I will check a basic metabolic panel today as well.  3. Lower extremity edema: Increasing Lasix to 40 mg daily. Checking basic metabolic panel today and will plan to follow-up again in one week. We did discuss the importance of sodium restriction. The sounds that she is doing much better job with this. We also discussed reports keeping her legs elevated when sitting.  4. Lipid status: Currently unknown. She is not on a statin. With history of coronary disease, she should likely be on a statin. Await records from [place], though we will need to take into this further at her next visit.  5. Calf claudication: Patient has been having cramping with ambulation of bilateral calves. I'll arrange for ABIs.   6. Disposition: Follow-up labs as outlined above. Follow up in clinic in one month or sooner if necessary.  Murray Hodgkins, NP 10/11/2016, 1:41 PM

## 2016-10-11 NOTE — Patient Instructions (Signed)
Medication Instructions:  INCREASE Lasix to 40mg  (2 tablets) one time daily.  Labwork: Have lab work today Artist).   Return in 1 week for lab work as well (BMET)  Testing/Procedures: Your physician has requested that you have an ankle brachial index (ABI). During this test an ultrasound and blood pressure cuff are used to evaluate the arteries that supply the arms and legs with blood. Allow thirty minutes for this exam. There are no restrictions or special instructions.   Follow-Up: Your physician recommends that you schedule a follow-up appointment in: 1 month with Kerin Ransom PA or Rosaria Ferries PA   Any Other Special Instructions Will Be Listed Below (If Applicable).     If you need a refill on your cardiac medications before your next appointment, please call your pharmacy.

## 2016-10-12 LAB — BASIC METABOLIC PANEL
BUN: 8 mg/dL (ref 7–25)
CHLORIDE: 107 mmol/L (ref 98–110)
CO2: 22 mmol/L (ref 20–31)
Calcium: 9.4 mg/dL (ref 8.6–10.4)
Creat: 0.78 mg/dL (ref 0.50–0.99)
Glucose, Bld: 108 mg/dL — ABNORMAL HIGH (ref 65–99)
POTASSIUM: 4.5 mmol/L (ref 3.5–5.3)
Sodium: 142 mmol/L (ref 135–146)

## 2016-10-12 NOTE — Progress Notes (Signed)
Can we re-request records on this patient? I believe that we sent for scanning? I need to see if she has been on a statin.

## 2016-10-16 ENCOUNTER — Telehealth: Payer: Self-pay | Admitting: Cardiology

## 2016-10-16 ENCOUNTER — Other Ambulatory Visit: Payer: Self-pay | Admitting: *Deleted

## 2016-10-16 DIAGNOSIS — I10 Essential (primary) hypertension: Secondary | ICD-10-CM

## 2016-10-16 NOTE — Telephone Encounter (Signed)
New Message    Per pt she tried to do lab work last week, and they were unable to draw her blood. They sent her to another location, and she is unsure of where it is located. Requesting call back

## 2016-10-16 NOTE — Telephone Encounter (Signed)
Patient explains that she wanted an appt at [facility] office to draw labs. She assumed an appt had already been made, but I reviewed this and informed her I did't see an appt on schedule. Informed her I'd be glad to schedule for her. Lab draw appt made for Thursday AM, order linked, pt voiced thanks, no further needs/questions at this time.

## 2016-10-17 DIAGNOSIS — M272 Inflammatory conditions of jaws: Secondary | ICD-10-CM | POA: Diagnosis not present

## 2016-10-19 ENCOUNTER — Other Ambulatory Visit: Payer: Medicare Other

## 2016-10-19 DIAGNOSIS — I10 Essential (primary) hypertension: Secondary | ICD-10-CM | POA: Diagnosis not present

## 2016-10-19 LAB — BASIC METABOLIC PANEL
BUN: 16 mg/dL (ref 7–25)
CHLORIDE: 108 mmol/L (ref 98–110)
CO2: 23 mmol/L (ref 20–31)
Calcium: 9.3 mg/dL (ref 8.6–10.4)
Creat: 0.84 mg/dL (ref 0.50–0.99)
Glucose, Bld: 117 mg/dL — ABNORMAL HIGH (ref 65–99)
Potassium: 4.1 mmol/L (ref 3.5–5.3)
Sodium: 143 mmol/L (ref 135–146)

## 2016-10-20 DIAGNOSIS — M272 Inflammatory conditions of jaws: Secondary | ICD-10-CM | POA: Diagnosis not present

## 2016-10-23 ENCOUNTER — Telehealth: Payer: Self-pay | Admitting: Nurse Practitioner

## 2016-10-23 MED ORDER — TICAGRELOR 90 MG PO TABS: 90.0000 mg | ORAL_TABLET | Freq: Two times a day (BID) | ORAL | 1 refills | Status: DC

## 2016-10-23 MED ORDER — LISINOPRIL 40 MG PO TABS: 40.0000 mg | ORAL_TABLET | Freq: Every day | ORAL | 1 refills | Status: DC

## 2016-10-23 MED ORDER — METOPROLOL SUCCINATE ER 50 MG PO TB24: 50.0000 mg | ORAL_TABLET | Freq: Every day | ORAL | 1 refills | Status: DC

## 2016-10-23 MED ORDER — ASPIRIN EC 81 MG PO TBEC: 81.0000 mg | DELAYED_RELEASE_TABLET | Freq: Every day | ORAL | 1 refills | Status: DC

## 2016-10-23 MED ORDER — AMLODIPINE BESYLATE 10 MG PO TABS: 10.0000 mg | ORAL_TABLET | Freq: Every day | ORAL | 1 refills | Status: DC

## 2016-10-23 NOTE — Telephone Encounter (Signed)
New Message   *STAT* If patient is at the pharmacy, call can be transferred to refill team.   1. Which medications need to be refilled? (please list name of each medication and dose if known)  amlodipine 10 mg tablet once daily metoprolol succinate 50 mg 24 hr tablet once daily aspirin 81 mg once daily linsinopril 40 mg tablet once daily brilinta 90 mg twice daily  2. Which pharmacy/location (including street and city if local pharmacy) is medication to be sent to? [facility] [phone], [address], [place]  3. Do they need a 30 day or 90 day supply?  Pt voiced some are 30 and some are 90 days. She is a new patient.

## 2016-10-23 NOTE — Telephone Encounter (Signed)
Attempted to contact patient to determine which Rx(s) need to be filled for 30 versus 90 days.  Unable to reach patient - all requested meds refilled for 90 day supply

## 2016-10-25 ENCOUNTER — Encounter: Payer: Self-pay | Admitting: Neurology

## 2016-10-25 ENCOUNTER — Ambulatory Visit (INDEPENDENT_AMBULATORY_CARE_PROVIDER_SITE_OTHER): Payer: Medicare Other | Admitting: Neurology

## 2016-10-25 VITALS — BP 130/84 | HR 76 | Ht 66.0 in | Wt 291.1 lb

## 2016-10-25 DIAGNOSIS — D352 Benign neoplasm of pituitary gland: Secondary | ICD-10-CM | POA: Diagnosis not present

## 2016-10-25 DIAGNOSIS — I25119 Atherosclerotic heart disease of native coronary artery with unspecified angina pectoris: Secondary | ICD-10-CM | POA: Diagnosis not present

## 2016-10-25 DIAGNOSIS — G43009 Migraine without aura, not intractable, without status migrainosus: Secondary | ICD-10-CM | POA: Diagnosis not present

## 2016-10-25 DIAGNOSIS — H53451 Other localized visual field defect, right eye: Secondary | ICD-10-CM | POA: Diagnosis not present

## 2016-10-25 MED ORDER — AMITRIPTYLINE HCL 75 MG PO TABS: 75.0000 mg | ORAL_TABLET | Freq: Every day | ORAL | 0 refills | Status: DC

## 2016-10-25 MED ORDER — RIZATRIPTAN BENZOATE 10 MG PO TABS: ORAL_TABLET | ORAL | 3 refills | Status: DC

## 2016-10-25 NOTE — Progress Notes (Signed)
NEUROLOGY CONSULTATION NOTE  Alisha [name] MRN: [id number] DOB: 1950-09-16  Referring provider: Dr. Juleen China Primary care provider: Dr. Juleen China  Reason for consult:  Benign pituitary tumor  HISTORY OF PRESENT ILLNESS: Alisha [name] is a 66 year old right-handed female with asthma, CAD, fibromyalgia, hypertension, and depression who presents for benign pituitary tumor.    She recently moved to [place] from [place], [place] to live with her daughter.  In the mid-1990s, she began experiencing headaches.  She reported difficulty with seeing the eye chart on physical exams.  In 1999, she was found to have peripheral vision loss by an eye doctor.  She had an MRI of the brain which revealed a pituitary mass that was compressing the optic nerve.  Biopsy confirmed it to be benign.  She underwent 3 surgeries, including transphenoidal surgery.  She underwent radiation about 4 years ago.  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  She would periodically have repeat MRI of brain performed and regular visual field testing.  She has migraine headaches.  They are "15"/10 intensity, pounding bi-temporal and occipital headache, associated with nausea, photophobia and phonophobia.  They last 1 to 2 days and occur about 4 or 5 times a year.  Stress is a trigger.  Maxalt relieves it.  If Maxalt doesn't work, she goes to the ED.  She also has dull daily headaches as well.  She also has history of vertigo and falls.  She has fibromyalgia and depression, and takes multiple medications, including chronic opioid use.   Current NSAIDS:  no Current analgesics:  MS Contin, Percocet Current triptans:  Maxalt 10mg  Current anti-emetic:  no Current muscle relaxants:  no Current anti-anxiolytic:  no Current sleep aide:  no Current Antihypertensive medications:  Toprol, Lasix, amlodipine, lisinopril Current Antidepressant medications:  Amitriptyline 75mg , sertraline 25mg  Current  Anticonvulsant medications:  Gabapentin 600mg  three times daily Current Vitamins/Herbal/Supplements:  no Current Antihistamines/Decongestants:  Claritin  10/19/16:  BMP with Na 143, K 4.1, Cl 108, CO2 23, glucose 117, BUN 16, Cr 0.84.  PAST MEDICAL HISTORY: Past Medical History:  Diagnosis Date  . Arthritis   . Asthma   . CAD (coronary artery disease)    a. 05/2016 s/p stenting x 2 in [place] (records not available).  . Depression   . Fibromyalgia   . GERD (gastroesophageal reflux disease)   . Hyperlipidemia   . Hypertension   . Morbid obesity ([place])     PAST SURGICAL HISTORY: Past Surgical History:  Procedure Laterality Date  . carpel tunnel release Bilateral 2017  . EXCISION / CURETTAGE BONE CYST PHALANGES OF FOOT  2014   Removal of foot cyst   . PARTIAL HYSTERECTOMY  unknown   Patient still has ovaries  . pituitary tumor removal  1999  . TONSILLECTOMY    . TOTAL KNEE ARTHROPLASTY     TKR X 3    MEDICATIONS: Current Outpatient Prescriptions on File Prior to Visit  Medication Sig Dispense Refill  . albuterol (PROVENTIL HFA;VENTOLIN HFA) 108 (90 Base) MCG/ACT inhaler Inhale 2 puffs into the lungs every 6 (six) hours as needed for wheezing or shortness of breath. 1 Inhaler 6  . amLODipine (NORVASC) 10 MG tablet Take 1 tablet (10 mg total) by mouth daily. 90 tablet 1  . ammonium lactate (LAC-HYDRIN) 12 % lotion Apply 1 application topically as needed for dry skin. 400 g 1  . aspirin EC 81 MG tablet Take 1 tablet (81 mg total) by mouth daily. 90 tablet  1  . budesonide-formoterol (SYMBICORT) 160-4.5 MCG/ACT inhaler Inhale 2 puffs into the lungs 2 (two) times daily. 1 Inhaler 6  . Cholecalciferol 2000 units CAPS Take by mouth.    . furosemide (LASIX) 20 MG tablet Take 2 tablets (40 mg total) by mouth daily. 60 tablet 3  . gabapentin (NEURONTIN) 600 MG tablet Take 1 tablet (600 mg total) by mouth 3 (three) times daily. 90 tablet 3  . guaiFENesin (MUCINEX) 600 MG 12 hr tablet Take  600 mg by mouth 2 (two) times daily.    . isosorbide mononitrate (IMDUR) 30 MG 24 hr tablet Take 1 tablet (30 mg total) by mouth daily. 90 tablet 3  . leflunomide (ARAVA) 20 MG tablet Take 1 tablet (20 mg total) by mouth daily. 30 tablet 0  . lisinopril (PRINIVIL,ZESTRIL) 40 MG tablet Take 1 tablet (40 mg total) by mouth daily. 90 tablet 1  . loratadine (CLARITIN) 10 MG tablet Take 1 tablet (10 mg total) by mouth daily. 30 tablet 6  . metoprolol succinate (TOPROL-XL) 50 MG 24 hr tablet Take 1 tablet (50 mg total) by mouth daily. Take with or immediately following a meal. 90 tablet 1  . mirabegron ER (MYRBETRIQ) 25 MG TB24 tablet Take 1 tablet (25 mg total) by mouth daily. 30 tablet 2  . morphine (MS CONTIN) 15 MG 12 hr tablet Take 1 tablet (15 mg total) by mouth 3 (three) times daily. 90 tablet 0  . nitroGLYCERIN (NITROSTAT) 0.4 MG SL tablet Place 1 tablet (0.4 mg total) under the tongue every 5 (five) minutes as needed for chest pain. 25 tablet 3  . oxyCODONE-acetaminophen (ROXICET) 5-325 MG tablet Take 1 tablet by mouth every 8 (eight) hours as needed for severe pain. 90 tablet 0  . sertraline (ZOLOFT) 25 MG tablet Take 1 tablet (25 mg total) by mouth daily. 30 tablet 2  . ticagrelor (BRILINTA) 90 MG TABS tablet Take 1 tablet (90 mg total) by mouth 2 (two) times daily. 180 tablet 1  . triamcinolone ointment (KENALOG) 0.1 % Apply 1 application topically 2 (two) times daily.    . Diclofenac Sodium 1.5 % SOLN Place onto the skin.    . meloxicam (MOBIC) 15 MG tablet Take 1 tablet (15 mg total) by mouth daily. (Patient not taking: Reported on 10/25/2016) 30 tablet 3   No current facility-administered medications on file prior to visit.     ALLERGIES: Allergies  Allergen Reactions  . Ampicillin Nausea Only  . Asa [Aspirin] Diarrhea  . Darvon [Propoxyphene] Nausea And Vomiting  . Salicylates Other (See Comments)    Unknown   . Tetracyclines & Related Other (See Comments)    Nerves feeling      FAMILY HISTORY: Family History  Problem Relation Age of Onset  . Heart disease Mother 45    CABG, pacemaker, valve  . Cancer Father   . Heart disease Sister     Fluid around the heart    SOCIAL HISTORY: Social History   Social History  . Marital status: Divorced    Spouse name: N/A  . Number of children: N/A  . Years of education: N/A   Occupational History  . Not on file.   Social History Main Topics  . Smoking status: Never Smoker  . Smokeless tobacco: Never Used  . Alcohol use Yes     Comment: Socially. [name].  . Drug use: No  . Sexual activity: No   Other Topics Concern  . Not on file  Social History Narrative   Current Social History        Who lives at home: Lives with youngest daughter, "Olivia Mackie" 09/25/2016    Transportation: Public [id number]   Important Relationships & Pets: 2 daughters (52, 17), 1 son (44), 10 grandchildren, 4 great grandchildren 09/25/2016    Current Stressors: Recently moved from [place], [place] due to threat of NH placement 09/25/2016   Religious / Personal Beliefs: Christian 09/25/2016   Interests / Fun: Dancing 09/25/2016   Other: Participates in [employer] 09/25/2016       REVIEW OF SYSTEMS: Constitutional: No fevers, chills, or sweats, no generalized fatigue, change in appetite Eyes: No visual changes, double vision, eye pain Ear, nose and throat: No hearing loss, ear pain, nasal congestion, sore throat Cardiovascular: No chest pain, palpitations Respiratory:  No shortness of breath at rest or with exertion, wheezes GastrointestinaI: No nausea, vomiting, diarrhea, abdominal pain, fecal incontinence Genitourinary:  No dysuria, urinary retention or frequency Musculoskeletal:  Diffuse joint pain Integumentary: No rash, pruritus, skin lesions Neurological: as above Psychiatric: depression Endocrine: No palpitations, fatigue, diaphoresis, mood swings, change in appetite, change in weight, increased  thirst Hematologic/Lymphatic:  No purpura, petechiae. Allergic/Immunologic: no itchy/runny eyes, nasal congestion, recent allergic reactions, rashes  PHYSICAL EXAM: Vitals:   10/25/16 1254  BP: 130/84  Pulse: 76   General: No acute distress.  Morbidly obese Head:  Normocephalic/atraumatic Eyes:  fundi examined but not visualized Neck: supple, no paraspinal tenderness, full range of motion Back: No paraspinal tenderness Heart: regular rate and rhythm Lungs: Clear to auscultation bilaterally. Vascular: No carotid bruits. Neurological Exam: Mental status: alert and oriented to person, place, and time, recent and remote memory intact, fund of knowledge intact, attention and concentration intact, speech fluent and not dysarthric, language intact. Cranial nerves: CN I: not tested CN II: pupils equal, round and reactive to light, decreased temporal vision loss in right eye. CN III, IV, VI:  full range of motion, no nystagmus, no ptosis CN V: facial sensation intact CN VII: upper and lower face symmetric CN VIII: hearing intact CN IX, X: gag intact, uvula midline CN XI: sternocleidomastoid and trapezius muscles intact CN XII: tongue midline Bulk & Tone: normal, no fasciculations. Motor:  5/5 throughout  Sensation:  temperature and vibration sensation intact. Deep Tendon Reflexes:  absent throughout, toes downgoing. Finger to nose testing:  Without dysmetria.  Heel to shin:  Unable to assess Gait:  Usually ambulates with cane, which is not available.  Antalgic, cautious gait.  Romberg negative.  IMPRESSION: 1.  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  Migraines 3.  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  Morbid obesity 5.  Medication overuse headache  PLAN: 1.  Repeat MRI of brain w/wo contrast with attention to pituitary gland 2.  Refer to ophthalmology for eye exam/visual field testing. 3.  Maxalt 10mg  as needed for migraine 4.  Weight loss 5.  Obtain notes from  neurologist in [place] 6.  If she should have an intractable migraine not responsive to Maxalt, she may contact our office if during office hours and we can have her come in for headache cocktail (she must have a driver.  She does not drive). 7.  Follow up in 6 months or as needed.  Thank you for allowing me to take part in the care of this patient.  Metta Clines, DO  CC:  Briscoe Deutscher, DO

## 2016-10-25 NOTE — Patient Instructions (Addendum)
1.  I refilled your Maxalt (rizatriptan).  Take as directed. 2.  We will get an MRI of the brain with and without contrast with attention to the pituitary gland. We have sent a referral to [facility] for your MRI and they will call you directly to schedule your appt. They are located at [place]. If you need to contact them directly please call [phone]. 3.  We will refer you to ophthalmology for vision check. We have set up an appt with [facility]. This is schedule for 10/27/2016 at 2:00 pm with Dr. Valetta Close. If this is not a good date/time you can call them directly at [phone]. 4.  Follow up in 6 months.

## 2016-10-26 ENCOUNTER — Encounter: Payer: Self-pay | Admitting: Family Medicine

## 2016-10-26 ENCOUNTER — Telehealth: Payer: Self-pay

## 2016-10-26 ENCOUNTER — Ambulatory Visit (INDEPENDENT_AMBULATORY_CARE_PROVIDER_SITE_OTHER): Payer: Medicare Other | Admitting: Family Medicine

## 2016-10-26 VITALS — BP 124/78 | HR 78 | Temp 97.8°F | Ht 66.0 in | Wt 291.0 lb

## 2016-10-26 DIAGNOSIS — I25119 Atherosclerotic heart disease of native coronary artery with unspecified angina pectoris: Secondary | ICD-10-CM | POA: Diagnosis not present

## 2016-10-26 DIAGNOSIS — K4090 Unilateral inguinal hernia, without obstruction or gangrene, not specified as recurrent: Secondary | ICD-10-CM

## 2016-10-26 DIAGNOSIS — I10 Essential (primary) hypertension: Secondary | ICD-10-CM

## 2016-10-26 DIAGNOSIS — N3281 Overactive bladder: Secondary | ICD-10-CM

## 2016-10-26 DIAGNOSIS — L4050 Arthropathic psoriasis, unspecified: Secondary | ICD-10-CM | POA: Diagnosis not present

## 2016-10-26 NOTE — Progress Notes (Signed)
Alisha [name] is a 66 y.o. female is here to discuss:  History of Present Illness:    1. Hernia, inguinal, left. Hx of the same. Ongoing. Patient feels a bulge in her left inguinal area whenever she strains to have a BM or sits up. She sometimes feels the need to push the bulge "back in." No Hx of being unable to push it back. No melena or BRBPR. No N/V.    2. Essential hypertension. Followed by Cardiology now. Medications adjusted. Avoiding excessive salt intake. Trying to exercise on a regular basis. Denies chest pain. Taking medications as prescribed without side effects.   Wt Readings from Last 3 Encounters:  10/26/16 291 lb (132 kg)  10/25/16 291 lb 2 oz (132.1 kg)  10/11/16 (!) 307 lb 12.8 oz (139.6 kg)   BP Readings from Last 3 Encounters:  10/26/16 124/78  10/25/16 130/84  10/11/16 (!) 145/78   Lab Results  Component Value Date   CREATININE 0.84 10/19/2016   The patient is very excited to start Cardiac rehab soon.    3. OAB (overactive bladder). She had an episode of incontinence this am due to the bus being late. Genito-Urinary ROS: negative for - dysuria, hematuria, pelvic pain or vulvar/vaginal symptoms.    4. 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 Needs referral to Rheumatology to continue medications.    Health Maintenance Due  Topic Date Due  . Hepatitis C Screening  Apr 01, 1951  . MAMMOGRAM  05/23/1999  . COLONOSCOPY  05/23/1999  . DEXA SCAN  05/22/2014  . PNA vac Low Risk Adult (1 of 2 - PCV13) 05/22/2014    PMHx, SurgHx, SocialHx, FamHx, Medications, and Allergies were reviewed in the Visit Navigator and updated as appropriate.    Patient Active Problem List   Diagnosis Date Noted  . Venous stasis of both lower extremities 10/04/2016  . Poor dentition 10/04/2016  . OSA (obstructive sleep apnea) 10/04/2016  . History of nephrolithiasis 10/04/2016  . Hx of pilonidal cyst 10/04/2016  . Bipolar depression ([place]) 10/04/2016  . Personal history of DVT (deep  vein thrombosis) 10/04/2016  . Morbid obesity ([place]) 09/25/2016  . 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 Essential hypertension 09/25/2016  . 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 Vision impairment 09/25/2016  . Dysthymia 09/25/2016  . Chronic seasonal allergic rhinitis due to pollen 09/25/2016  . Moderate persistent asthma without complication [id number]  . Gastroesophageal reflux disease without esophagitis 09/25/2016  . OAB (overactive bladder) 09/25/2016  . Chronic pain syndrome 09/25/2016    Social History  Substance Use Topics  . Smoking status: Never Smoker  . Smokeless tobacco: Never Used  . Alcohol use Yes     Comment: Socially. [name].     Current Medications and Allergies:    Current Outpatient Prescriptions:  .  albuterol (PROVENTIL HFA;VENTOLIN HFA) 108 (90 Base) MCG/ACT inhaler, Inhale 2 puffs into the lungs every 6 (six) hours as needed for wheezing or shortness of breath., Disp: 1 Inhaler, Rfl: 6 .  amitriptyline (ELAVIL) 75 MG tablet, Take 1 tablet (75 mg total) by mouth at bedtime., Disp: 30 tablet, Rfl: 0 .  amLODipine (NORVASC) 10 MG tablet, Take 1 tablet (10 mg total) by mouth daily., Disp: 90 tablet, Rfl: 1 .  ammonium lactate (LAC-HYDRIN) 12 % lotion, Apply 1 application topically as needed for dry skin., Disp: 400 g, Rfl: 1 .  aspirin EC 81 MG tablet, Take 1 tablet (81 mg total) by mouth daily., Disp:  90 tablet, Rfl: 1 .  budesonide-formoterol (SYMBICORT) 160-4.5 MCG/ACT inhaler, Inhale 2 puffs into the lungs 2 (two) times daily., Disp: 1 Inhaler, Rfl: 6 .  Cholecalciferol 2000 units CAPS, Take by mouth., Disp: , Rfl:  .  Diclofenac Sodium 1.5 % SOLN, Place onto the skin., Disp: , Rfl:  .  furosemide (LASIX) 20 MG tablet, Take 2 tablets (40 mg total) by mouth daily., Disp: 60 tablet, Rfl: 3 .  gabapentin (NEURONTIN) 600 MG tablet, Take 1 tablet (600 mg total) by mouth 3 (three) times daily., Disp: 90  tablet, Rfl: 3 .  guaiFENesin (MUCINEX) 600 MG 12 hr tablet, Take 600 mg by mouth 2 (two) times daily., Disp: , Rfl:  .  isosorbide mononitrate (IMDUR) 30 MG 24 hr tablet, Take 1 tablet (30 mg total) by mouth daily., Disp: 90 tablet, Rfl: 3 .  leflunomide (ARAVA) 20 MG tablet, Take 1 tablet (20 mg total) by mouth daily., Disp: 30 tablet, Rfl: 0 .  lisinopril (PRINIVIL,ZESTRIL) 40 MG tablet, Take 1 tablet (40 mg total) by mouth daily., Disp: 90 tablet, Rfl: 1 .  loratadine (CLARITIN) 10 MG tablet, Take 1 tablet (10 mg total) by mouth daily., Disp: 30 tablet, Rfl: 6 .  meloxicam (MOBIC) 15 MG tablet, Take 1 tablet (15 mg total) by mouth daily., Disp: 30 tablet, Rfl: 3 .  metoprolol succinate (TOPROL-XL) 50 MG 24 hr tablet, Take 1 tablet (50 mg total) by mouth daily. Take with or immediately following a meal., Disp: 90 tablet, Rfl: 1 .  mirabegron ER (MYRBETRIQ) 25 MG TB24 tablet, Take 1 tablet (25 mg total) by mouth daily., Disp: 30 tablet, Rfl: 2 .  morphine (MS CONTIN) 15 MG 12 hr tablet, Take 1 tablet (15 mg total) by mouth 3 (three) times daily., Disp: 90 tablet, Rfl: 0 .  nitroGLYCERIN (NITROSTAT) 0.4 MG SL tablet, Place 1 tablet (0.4 mg total) under the tongue every 5 (five) minutes as needed for chest pain., Disp: 25 tablet, Rfl: 3 .  oxyCODONE-acetaminophen (ROXICET) 5-325 MG tablet, Take 1 tablet by mouth every 8 (eight) hours as needed for severe pain., Disp: 90 tablet, Rfl: 0 .  rizatriptan (MAXALT) 10 MG tablet, Take 1 tablet for headache.  May repeat once in 2 hours if needed.  Do not exceed 2 tablets in 24 hours., Disp: 10 tablet, Rfl: 3 .  sertraline (ZOLOFT) 25 MG tablet, Take 1 tablet (25 mg total) by mouth daily., Disp: 30 tablet, Rfl: 2 .  ticagrelor (BRILINTA) 90 MG TABS tablet, Take 1 tablet (90 mg total) by mouth 2 (two) times daily., Disp: 180 tablet, Rfl: 1 .  triamcinolone ointment (KENALOG) 0.1 %, Apply 1 application topically 2 (two) times daily., Disp: , Rfl:    Allergies    Allergen Reactions  . Ampicillin Nausea Only  . Asa [Aspirin] Diarrhea  . Darvon [Propoxyphene] Nausea And Vomiting  . Salicylates Other (See Comments)    Unknown   . Tetracyclines & Related Other (See Comments)    Nervous feeling     Review of Systems   Review of Systems: The patient denies chills, fever, weight gain, fatigue, lack of appetite, difficulty swallowing, sore throat, earache, post-nasal drip, chest pain, palpitations, changes in blood pressures, swelling of legs, cough, dyspnea, wheezing, change in bowel habits, nausea, vomiting, changes in urination, joint or muscle pain, skin changes, dizziness, headaches, numbness, changes in balance or coordination, anxiety, depression, memory changes, swollen glands, easy bruising.    Vitals:   Vitals:  10/26/16 0830  BP: 124/78  Pulse: 78  Temp: 97.8 F (36.6 C)  TempSrc: Oral  SpO2: 97%  Weight: 291 lb (132 kg)  Height: 5\' 6"  (1.676 m)     Body mass index is 46.97 kg/m.   Physical Exam:   Physical Exam  Constitutional: She is well-developed, well-nourished, and in no distress. No distress.  HENT:  Head: Normocephalic and atraumatic.  Eyes: Pupils are equal, round, and reactive to light.  Neck: Normal range of motion. No thyromegaly present.  Cardiovascular: Normal rate and regular rhythm.   Pulmonary/Chest: Effort normal.  Abdominal: Soft.  Musculoskeletal: She exhibits no edema.  Neurological: She is alert.  Skin: Skin is warm.  Psychiatric: Affect normal.   Assessment and Plan:    Briyanna was seen today for follow-up.  Diagnoses and all orders for this visit:  Hernia, inguinal, left Comments: Suspected. Difficult to palpate today. Patient with pannus. Reviewed red flagss for incarceration. Rec bowel regimen to keep stool soft.  Essential hypertension Comments: Well controlled.  No signs of complications, medication side effects, or red flags.  Continue current regimen.    OAB (overactive  bladder) Comments: Well controlled.  No signs of complications, medication side effects, or red flags.  Continue current regimen.    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 Reviewed expectations re: course of current medical issues. . Discussed self-management of symptoms. . Outlined signs and symptoms indicating need for more acute intervention. . Patient verbalized understanding and all questions were answered. . See orders for this visit as documented in the electronic medical record. . Patient received an After Visit Summary.  I spent 30 minutes with this patient, greater than 50% was face-to-face time counseling regarding the above diagnoses.   Briscoe Deutscher, [place], [place] 10/26/2016  Follow-up: 1-2 months.

## 2016-10-26 NOTE — Progress Notes (Signed)
Pre visit review using our clinic review tool, if applicable. No additional management support is needed unless otherwise documented below in the visit note. 

## 2016-10-26 NOTE — Telephone Encounter (Signed)
Patient name: Alisha [name] DOB:  1951-05-09 MRN #:  [id number]  Controlled substance:  Roxicet 5-325, MS Contin 15 mg  Pharmacy:  [facility]. [place], [place]  Status (No new findings/new findings):  No new findings.  New Findings (if applicable):    Additional Comments:     **New findings routed to provider**

## 2016-11-01 ENCOUNTER — Ambulatory Visit (HOSPITAL_COMMUNITY)
Admission: RE | Admit: 2016-11-01 | Discharge: 2016-11-01 | Disposition: A | Discharge status: Home / Self Care | Payer: Medicare Other | Source: Ambulatory Visit | Attending: Cardiovascular Disease | Admitting: Cardiovascular Disease

## 2016-11-01 DIAGNOSIS — I739 Peripheral vascular disease, unspecified: Secondary | ICD-10-CM

## 2016-11-01 DIAGNOSIS — E785 Hyperlipidemia, unspecified: Secondary | ICD-10-CM | POA: Insufficient documentation

## 2016-11-01 DIAGNOSIS — I2510 Atherosclerotic heart disease of native coronary artery without angina pectoris: Secondary | ICD-10-CM | POA: Diagnosis not present

## 2016-11-01 DIAGNOSIS — I10 Essential (primary) hypertension: Secondary | ICD-10-CM | POA: Insufficient documentation

## 2016-11-02 ENCOUNTER — Encounter (HOSPITAL_COMMUNITY)
Admission: RE | Admit: 2016-11-02 | Discharge: 2016-11-02 | Disposition: A | Discharge status: Home / Self Care | Payer: Medicare Other | Source: Ambulatory Visit | Attending: Cardiology | Admitting: Cardiology

## 2016-11-02 ENCOUNTER — Telehealth (HOSPITAL_COMMUNITY): Payer: Self-pay | Admitting: *Deleted

## 2016-11-02 ENCOUNTER — Encounter (HOSPITAL_COMMUNITY): Payer: Self-pay

## 2016-11-02 VITALS — BP 110/72 | HR 62 | Ht 65.0 in | Wt 289.5 lb

## 2016-11-02 DIAGNOSIS — I214 Non-ST elevation (NSTEMI) myocardial infarction: Secondary | ICD-10-CM

## 2016-11-02 DIAGNOSIS — Z955 Presence of coronary angioplasty implant and graft: Secondary | ICD-10-CM

## 2016-11-02 HISTORY — DX: Personal history of other benign neoplasm: Z86.018

## 2016-11-02 HISTORY — DX: Dizziness and giddiness: R42

## 2016-11-02 HISTORY — DX: Migraine, unspecified, not intractable, without status migrainosus: G43.909

## 2016-11-02 NOTE — Telephone Encounter (Signed)
Called and informed pt to hold off on full exercise per Dr. Tomi Likens recommendation until MRI is completed and reviewed.  Pt verbalized understanding and is in agreement of this.  Pt asked if she could attend the nutrition class on Tuesdays.  Advised pt that attending the nutrition class is permissible. Cherre Huger, BSN

## 2016-11-02 NOTE — Progress Notes (Signed)
Cardiac Individual Treatment Plan  Patient Details  Name: Alisha [name] MRN: [id number] Date of Birth: [birth year] Referring Provider:   Flowsheet Row CARDIAC REHAB PHASE II ORIENTATION from 11/02/2016 in [facility]  Referring Provider  Marijo File MD      Initial Encounter Date:  [facility] from 11/02/2016 in [place]  Date  11/02/16  Referring Provider  Marijo File MD      Visit Diagnosis: Status post coronary artery stent placement  NSTEMI (non-ST elevated myocardial infarction) ([place])  Patient's Home Medications on Admission:  Current Outpatient Prescriptions:  .  albuterol (PROVENTIL HFA;VENTOLIN HFA) 108 (90 Base) MCG/ACT inhaler, Inhale 2 puffs into the lungs every 6 (six) hours as needed for wheezing or shortness of breath., Disp: 1 Inhaler, Rfl: 6 .  amitriptyline (ELAVIL) 75 MG tablet, Take 1 tablet (75 mg total) by mouth at bedtime., Disp: 30 tablet, Rfl: 0 .  amLODipine (NORVASC) 10 MG tablet, Take 1 tablet (10 mg total) by mouth daily., Disp: 90 tablet, Rfl: 1 .  ammonium lactate (LAC-HYDRIN) 12 % lotion, Apply 1 application topically as needed for dry skin., Disp: 400 g, Rfl: 1 .  aspirin EC 81 MG tablet, Take 1 tablet (81 mg total) by mouth daily., Disp: 90 tablet, Rfl: 1 .  Cholecalciferol 2000 units CAPS, Take 1 capsule by mouth daily. , Disp: , Rfl:  .  Diclofenac Sodium 1.5 % SOLN, Place onto the skin 4 (four) times daily as needed. , Disp: , Rfl:  .  furosemide (LASIX) 20 MG tablet, Take 2 tablets (40 mg total) by mouth daily., Disp: 60 tablet, Rfl: 3 .  gabapentin (NEURONTIN) 600 MG tablet, Take 1 tablet (600 mg total) by mouth 3 (three) times daily., Disp: 90 tablet, Rfl: 3 .  guaiFENesin (MUCINEX) 600 MG 12 hr tablet, Take 600 mg by mouth daily. , Disp: , Rfl:  .  isosorbide mononitrate (IMDUR) 30 MG 24 hr tablet, Take 1 tablet (30 mg total) by mouth  daily., Disp: 90 tablet, Rfl: 3 .  leflunomide (ARAVA) 20 MG tablet, Take 1 tablet (20 mg total) by mouth daily., Disp: 30 tablet, Rfl: 0 .  lisinopril (PRINIVIL,ZESTRIL) 40 MG tablet, Take 1 tablet (40 mg total) by mouth daily., Disp: 90 tablet, Rfl: 1 .  loratadine (CLARITIN) 10 MG tablet, Take 1 tablet (10 mg total) by mouth daily., Disp: 30 tablet, Rfl: 6 .  metoprolol succinate (TOPROL-XL) 50 MG 24 hr tablet, Take 1 tablet (50 mg total) by mouth daily. Take with or immediately following a meal., Disp: 90 tablet, Rfl: 1 .  mirabegron ER (MYRBETRIQ) 25 MG TB24 tablet, Take 1 tablet (25 mg total) by mouth daily., Disp: 30 tablet, Rfl: 2 .  morphine (MS CONTIN) 15 MG 12 hr tablet, Take 1 tablet (15 mg total) by mouth 3 (three) times daily., Disp: 90 tablet, Rfl: 0 .  nitroGLYCERIN (NITROSTAT) 0.4 MG SL tablet, Place 1 tablet (0.4 mg total) under the tongue every 5 (five) minutes as needed for chest pain., Disp: 25 tablet, Rfl: 3 .  oxyCODONE-acetaminophen (ROXICET) 5-325 MG tablet, Take 1 tablet by mouth every 8 (eight) hours as needed for severe pain., Disp: 90 tablet, Rfl: 0 .  rizatriptan (MAXALT) 10 MG tablet, Take 1 tablet for headache.  May repeat once in 2 hours if needed.  Do not exceed 2 tablets in 24 hours., Disp: 10 tablet, Rfl: 3 .  sertraline (ZOLOFT)  25 MG tablet, Take 1 tablet (25 mg total) by mouth daily., Disp: 30 tablet, Rfl: 2 .  ticagrelor (BRILINTA) 90 MG TABS tablet, Take 1 tablet (90 mg total) by mouth 2 (two) times daily., Disp: 180 tablet, Rfl: 1 .  triamcinolone ointment (KENALOG) 0.1 %, Apply 1 application topically 2 (two) times daily., Disp: , Rfl:  .  budesonide-formoterol (SYMBICORT) 160-4.5 MCG/ACT inhaler, Inhale 2 puffs into the lungs 2 (two) times daily. (Patient not taking: Reported on 11/02/2016), Disp: 1 Inhaler, Rfl: 6  Past Medical History: Past Medical History:  Diagnosis Date  . Arthritis   . Asthma   . CAD (coronary artery disease)    a. 05/2016 s/p  stenting x 2 in [place] (records not available).  . Depression   . Fibromyalgia   . GERD (gastroesophageal reflux disease)   . History of benign pituitary tumor   . Hyperlipidemia   . Hypertension   . Migraine   . Morbid obesity ([place])   . Vertigo     Tobacco Use: History  Smoking Status  . Never Smoker  Smokeless Tobacco  . Never Used    Labs: Recent Review Flowsheet Data    There is no flowsheet data to display.      Capillary Blood Glucose: No results found for: GLUCAP   Exercise Target Goals: Date: 11/02/16  Exercise Program Goal: Individual exercise prescription set with THRR, safety & activity barriers. Participant demonstrates ability to understand and report RPE using BORG scale, to self-measure pulse accurately, and to acknowledge the importance of the exercise prescription.  Exercise Prescription Goal: Starting with aerobic activity 30 plus minutes a day, 3 days per week for initial exercise prescription. Provide home exercise prescription and guidelines that participant acknowledges understanding prior to discharge.  Activity Barriers & Risk Stratification:     Activity Barriers & Cardiac Risk Stratification - 11/02/16 1146      Activity Barriers & Cardiac Risk Stratification   Activity Barriers Left Knee Replacement;Right Knee Replacement;Neck/Spine Problems;Back Problems;Arthritis;Joint Problems;Deconditioning;Muscular Weakness;Shortness of Breath;Balance Concerns;History of Falls;Assistive Device;Other (comment)   Comments B ankle fusion, L wrist surgery, B carpal tunnel surgery, L wrist cyst removal   Cardiac Risk Stratification High      6 Minute Walk:     6 Minute Walk    Row Name 11/02/16 1135         6 Minute Walk   Phase Initial     Distance 0.45 feet  step test  .45km     Walk Time 6 minutes     # of Rest Breaks 0     MPH 0.27     METS 2     RPE 12     VO2 Peak 7     Symptoms No     Resting HR 62 bpm     Resting BP 110/72      Max Ex. HR 90 bpm     Max Ex. BP 126/70     2 Minute Post BP 110/74        Oxygen Initial Assessment:   Oxygen Re-Evaluation:   Oxygen Discharge (Final Oxygen Re-Evaluation):   Initial Exercise Prescription:     Initial Exercise Prescription - 11/02/16 1100      Date of Initial Exercise RX and Referring Provider   Date 11/02/16   Referring Provider Marijo File MD     NuStep   Level 2   SPM 90   Minutes 20   METs 2  Arm Ergometer   Level 1   Minutes 10   METs 1.3     Prescription Details   Frequency (times per week) 3   Duration Progress to 30 minutes of continuous aerobic without signs/symptoms of physical distress     Intensity   THRR 40-80% of Max Heartrate 61-122   Ratings of Perceived Exertion 11-13   Perceived Dyspnea 0-4     Progression   Progression Continue to progress workloads to maintain intensity without signs/symptoms of physical distress.     Resistance Training   Training Prescription Yes   Weight 1lb   Reps 10-15      Perform Capillary Blood Glucose checks as needed.  Exercise Prescription Changes:   Exercise Comments:   Exercise Goals and Review:      Exercise Goals    Row Name 11/02/16 1148             Exercise Goals   Increase Physical Activity Yes       Intervention Provide advice, education, support and counseling about physical activity/exercise needs.;Develop an individualized exercise prescription for aerobic and resistive training based on initial evaluation findings, risk stratification, comorbidities and participant's personal goals.       Expected Outcomes Achievement of increased cardiorespiratory fitness and enhanced flexibility, muscular endurance and strength shown through measurements of functional capacity and personal statement of participant.       Increase Strength and Stamina Yes       Intervention Develop an individualized exercise prescription for aerobic and resistive training based on initial  evaluation findings, risk stratification, comorbidities and participant's personal goals.;Provide advice, education, support and counseling about physical activity/exercise needs.       Expected Outcomes Achievement of increased cardiorespiratory fitness and enhanced flexibility, muscular endurance and strength shown through measurements of functional capacity and personal statement of participant.          Exercise Goals Re-Evaluation :    Discharge Exercise Prescription (Final Exercise Prescription Changes):   Nutrition:  Target Goals: Understanding of nutrition guidelines, daily intake of sodium 1500mg , cholesterol 200mg , calories 30% from fat and 7% or less from saturated fats, daily to have 5 or more servings of fruits and vegetables.  Biometrics:     Pre Biometrics - 11/02/16 1148      Pre Biometrics   Waist Circumference 52 inches   Hip Circumference 58 inches   Waist to Hip Ratio 0.9 %   Triceps Skinfold 42 mm   % Body Fat 58.1 %   Grip Strength 36 kg   Flexibility 0 in   Single Leg Stand 0 seconds       Nutrition Therapy Plan and Nutrition Goals:   Nutrition Discharge: Nutrition Scores:   Nutrition Goals Re-Evaluation:   Nutrition Goals Re-Evaluation:   Nutrition Goals Discharge (Final Nutrition Goals Re-Evaluation):   Psychosocial: Target Goals: Acknowledge presence or absence of significant depression and/or stress, maximize coping skills, provide positive support system. Participant is able to verbalize types and ability to use techniques and skills needed for reducing stress and depression.  Initial Review & Psychosocial Screening:     Initial Psych Review & Screening - 11/02/16 1727      Initial Review   Current issues with None Identified     Family Dynamics   Good Support System? Yes   Comments Pt recently moved from [place] to [place] and is residing with her youngest daughter and grandchild.     Barriers   Psychosocial barriers  to participate  in program There are no identifiable barriers or psychosocial needs.     Screening Interventions   Interventions Encouraged to exercise      Quality of Life Scores:     Quality of Life - 11/02/16 1152      Quality of Life Scores   Health/Function Pre 12.04 %   Socioeconomic Pre 18.42 %   Psych/Spiritual Pre 16.7 %   Family Pre 25.5 %   GLOBAL Pre 16.27 %      PHQ-9: Recent Review Flowsheet Data    Depression screen [facility] 2/9 09/25/2016   Decreased Interest 0   Down, Depressed, Hopeless 1   PHQ - 2 Score 1     Interpretation of Total Score  Total Score Depression Severity:  1-4 = Minimal depression, 5-9 = Mild depression, 10-14 = Moderate depression, 15-19 = Moderately severe depression, 20-27 = Severe depression   Psychosocial Evaluation and Intervention:   Psychosocial Re-Evaluation:   Psychosocial Discharge (Final Psychosocial Re-Evaluation):   Vocational Rehabilitation: Provide vocational rehab assistance to qualifying candidates.   Vocational Rehab Evaluation & Intervention:   Education: Education Goals: Education classes will be provided on a weekly basis, covering required topics. Participant will state understanding/return demonstration of topics presented.  Learning Barriers/Preferences:     Learning Barriers/Preferences - 11/02/16 1134      Learning Barriers/Preferences   Learning Barriers Sight   Learning Preferences None      Education Topics: Count Your Pulse:  -Group instruction provided by verbal instruction, demonstration, patient participation and written materials to support subject.  Instructors address importance of being able to find your pulse and how to count your pulse when at home without a heart monitor.  Patients get hands on experience counting their pulse with staff help and individually.   Heart Attack, Angina, and Risk Factor Modification:  -Group instruction provided by verbal instruction, video, and  written materials to support subject.  Instructors address signs and symptoms of angina and heart attacks.    Also discuss risk factors for heart disease and how to make changes to improve heart health risk factors.   Functional Fitness:  -Group instruction provided by verbal instruction, demonstration, patient participation, and written materials to support subject.  Instructors address safety measures for doing things around the house.  Discuss how to get up and down off the floor, how to pick things up properly, how to safely get out of a chair without assistance, and balance training.   Meditation and Mindfulness:  -Group instruction provided by verbal instruction, patient participation, and written materials to support subject.  Instructor addresses importance of mindfulness and meditation practice to help reduce stress and improve awareness.  Instructor also leads participants through a meditation exercise.    Stretching for Flexibility and Mobility:  -Group instruction provided by verbal instruction, patient participation, and written materials to support subject.  Instructors lead participants through series of stretches that are designed to increase flexibility thus improving mobility.  These stretches are additional exercise for major muscle groups that are typically performed during regular warm up and cool down.   Hands Only CPR Anytime:  -Group instruction provided by verbal instruction, video, patient participation and written materials to support subject.  Instructors co-teach with AHA video for hands only CPR.  Participants get hands on experience with mannequins.   Nutrition I class: Heart Healthy Eating:  -Group instruction provided by PowerPoint slides, verbal discussion, and written materials to support subject matter. The instructor gives an explanation and review of the  Therapeutic Lifestyle Changes diet recommendations, which includes a discussion on lipid goals, dietary  fat, sodium, fiber, plant stanol/sterol esters, sugar, and the components of a well-balanced, healthy diet.   Nutrition II class: Lifestyle Skills:  -Group instruction provided by PowerPoint slides, verbal discussion, and written materials to support subject matter. The instructor gives an explanation and review of label reading, grocery shopping for heart health, heart healthy recipe modifications, and ways to make healthier choices when eating out.   Diabetes Question & Answer:  -Group instruction provided by PowerPoint slides, verbal discussion, and written materials to support subject matter. The instructor gives an explanation and review of diabetes co-morbidities, pre- and post-prandial blood glucose goals, pre-exercise blood glucose goals, signs, symptoms, and treatment of hypoglycemia and hyperglycemia, and foot care basics.   Diabetes Blitz:  -Group instruction provided by PowerPoint slides, verbal discussion, and written materials to support subject matter. The instructor gives an explanation and review of the physiology behind type 1 and type 2 diabetes, diabetes medications and rational behind using different medications, pre- and post-prandial blood glucose recommendations and Hemoglobin A1c goals, diabetes diet, and exercise including blood glucose guidelines for exercising safely.    Portion Distortion:  -Group instruction provided by PowerPoint slides, verbal discussion, written materials, and food models to support subject matter. The instructor gives an explanation of serving size versus portion size, changes in portions sizes over the last 20 years, and what consists of a serving from each food group.   Stress Management:  -Group instruction provided by verbal instruction, video, and written materials to support subject matter.  Instructors review role of stress in heart disease and how to cope with stress positively.     Exercising on Your Own:  -Group instruction provided  by verbal instruction, power point, and written materials to support subject.  Instructors discuss benefits of exercise, components of exercise, frequency and intensity of exercise, and end points for exercise.  Also discuss use of nitroglycerin and activating EMS.  Review options of places to exercise outside of rehab.  Review guidelines for sex with heart disease.   Cardiac Drugs I:  -Group instruction provided by verbal instruction and written materials to support subject.  Instructor reviews cardiac drug classes: antiplatelets, anticoagulants, beta blockers, and statins.  Instructor discusses reasons, side effects, and lifestyle considerations for each drug class.   Cardiac Drugs II:  -Group instruction provided by verbal instruction and written materials to support subject.  Instructor reviews cardiac drug classes: angiotensin converting enzyme inhibitors (ACE-I), angiotensin II receptor blockers (ARBs), nitrates, and calcium channel blockers.  Instructor discusses reasons, side effects, and lifestyle considerations for each drug class.   Anatomy and Physiology of the Circulatory System:  -Group instruction provided by verbal instruction, video, and written materials to support subject.  Reviews functional anatomy of heart, how it relates to various diagnoses, and what role the heart plays in the overall system.   Knowledge Questionnaire Score:     Knowledge Questionnaire Score - 11/02/16 1152      Knowledge Questionnaire Score   Pre Score 13/28      Core Components/Risk Factors/Patient Goals at Admission:     Personal Goals and Risk Factors at Admission - 11/02/16 1149      Core Components/Risk Factors/Patient Goals on Admission    Weight Management Yes;Weight Maintenance;Weight Loss;Obesity   Intervention Weight Management: Develop a combined nutrition and exercise program designed to reach desired caloric intake, while maintaining appropriate intake of nutrient and fiber,  sodium and fats,  and appropriate energy expenditure required for the weight goal.;Weight Management: Provide education and appropriate resources to help participant work on and attain dietary goals.;Weight Management/Obesity: Establish reasonable short term and long term weight goals.;Obesity: Provide education and appropriate resources to help participant work on and attain dietary goals.   Expected Outcomes Short Term: Continue to assess and modify interventions until short term weight is achieved;Long Term: Adherence to nutrition and physical activity/exercise program aimed toward attainment of established weight goal;Weight Maintenance: Understanding of the daily nutrition guidelines, which includes 25-35% calories from fat, 7% or less cal from saturated fats, less than 200mg  cholesterol, less than 1.5gm of sodium, & 5 or more servings of fruits and vegetables daily;Weight Loss: Understanding of general recommendations for a balanced deficit meal plan, which promotes 1-2 lb weight loss per week and includes a negative energy balance of [phone] kcal/d;Understanding recommendations for meals to include 15-35% energy as protein, 25-35% energy from fat, 35-60% energy from carbohydrates, less than 200mg  of dietary cholesterol, 20-35 gm of total fiber daily;Understanding of distribution of calorie intake throughout the day with the consumption of 4-5 meals/snacks   Improve shortness of breath with ADL's Yes   Intervention Provide education, individualized exercise plan and daily activity instruction to help decrease symptoms of SOB with activities of daily living.   Expected Outcomes Short Term: Achieves a reduction of symptoms when performing activities of daily living.   Hypertension Yes   Intervention Provide education on lifestyle modifcations including regular physical activity/exercise, weight management, moderate sodium restriction and increased consumption of fresh fruit, vegetables, and low fat dairy,  alcohol moderation, and smoking cessation.;Monitor prescription use compliance.   Expected Outcomes Short Term: Continued assessment and intervention until BP is < 140/55mm HG in hypertensive participants. < 130/49mm HG in hypertensive participants with diabetes, heart failure or chronic kidney disease.;Long Term: Maintenance of blood pressure at goal levels.   Lipids Yes   Intervention Provide education and support for participant on nutrition & aerobic/resistive exercise along with prescribed medications to achieve LDL 70mg , HDL >40mg .   Expected Outcomes Short Term: Participant states understanding of desired cholesterol values and is compliant with medications prescribed. Participant is following exercise prescription and nutrition guidelines.;Long Term: Cholesterol controlled with medications as prescribed, with individualized exercise RX and with personalized nutrition plan. Value goals: LDL < 70mg , HDL > 40 mg.   Stress Yes   Intervention Offer individual and/or small group education and counseling on adjustment to heart disease, stress management and health-related lifestyle change. Teach and support self-help strategies.;Refer participants experiencing significant psychosocial distress to appropriate mental health specialists for further evaluation and treatment. When possible, include family members and significant others in education/counseling sessions.   Expected Outcomes Short Term: Participant demonstrates changes in health-related behavior, relaxation and other stress management skills, ability to obtain effective social support, and compliance with psychotropic medications if prescribed.;Long Term: Emotional wellbeing is indicated by absence of clinically significant psychosocial distress or social isolation.   Personal Goal Other Yes   Personal Goal get back [facility] water aerobics at [facility]   Intervention Provide exercise programming to assist with building exercise tolerance and aerobic  capacity.   Expected Outcomes Pt will build on aerobic capacity and get back to water aerobics.      Core Components/Risk Factors/Patient Goals Review:    Core Components/Risk Factors/Patient Goals at Discharge (Final Review):    ITP Comments:     ITP Comments    Row Name 11/02/16 1127  ITP Comments Dr. Fransico Him, Medical Director          Comments:  Pt arrived today via electronic scooter for phase II orientation from 0800- 1045.  As a part of the orientation appt.  Pt completed warm up stretches and 6 minute nustep test. Modification to the walk test was made by using the nustep due to pt inability to ambulate short distances without great difficulty.  Pt able to take a few steps with the use of a cane however this is awkward and poses a safety issues.  Pt tolerated the nustep test with no difficulties and was observed having fun singing along with the music. Monitor showed SR with no ectopy noted. Received notification from pt neurologist to hold on full exercise until MRI which is scheduled for 3/13 is completed. Pt has history of benign pituitary tumor, vertigo and migraines. Brief psychosocial assessment reveals some anxiety related to moving from [place] to [place].  Pt is establishing care with family, neurologist and cardiologist and learning how to navigate the medical community. Pt is looking forward to beginning full exercise once cleared by neurologist.

## 2016-11-02 NOTE — Telephone Encounter (Signed)
-----   Message from Pieter Partridge, DO sent at 11/02/2016 11:33 AM EST ----- Regarding: RE: Ok to participate in cardiac rehab Hello  Let me see what the MRI shows and I will get back to you.  ----- Message ----- From: Rowe Pavy, RN Sent: 11/01/2016   5:58 PM To: Pieter Partridge, DO Subject: Ok to participate in cardiac rehab             Dr. Tomi Likens  The above pt is eligible to participate in cardiac rehab s/p MI (in [place]).  Pt recently seen by you in consult to establish care.  Pt has benign pituitary tumor, migraine and vertigo.  Pt has upcoming MRI on 3/13.  Per your assessment, may patient participate in cardiac rehab?  If so, any restrictions of activities or exercise pt should avoid?   Thank you for your input.  Maurice Small RN

## 2016-11-02 NOTE — Progress Notes (Signed)
Cardiac Rehab Medication Review by a Pharmacist  Does the patient  feel that his/her medications are working for him/her?  yes  Has the patient been experiencing any side effects to the medications prescribed?  no  Does the patient measure his/her own blood pressure or blood glucose at home?  No - needs another BP cuff  Does the patient have any problems obtaining medications due to transportation or finances?   no  Understanding of regimen: good Understanding of indications: good Potential of compliance: fair    Pharmacist comments: Patient arrives in good spirits ambulating with assistance of walker. Per patient, not taking symbicort as this makes her feel jittery. Uses albuterol 2-3 times a week. Per patient, doesn't know s/sx heart attack - educated. Patient denies any s/sx hypotension, bradycardia, bleeding. Denies GI discomfort. Patient states that she uses voltaren PRN for arthritis. Educated on use QID regularly to combat inflammation.   Carlean Jews, Pharm.D. PGY1 Pharmacy Resident 3/8/20188:46 AM Pager [phone]

## 2016-11-06 ENCOUNTER — Encounter (HOSPITAL_COMMUNITY): Payer: Self-pay

## 2016-11-06 ENCOUNTER — Inpatient Hospital Stay (HOSPITAL_COMMUNITY): Admission: RE | Admit: 2016-11-06 | Payer: Medicare Other | Source: Ambulatory Visit

## 2016-11-07 ENCOUNTER — Other Ambulatory Visit: Payer: Medicare Other

## 2016-11-08 ENCOUNTER — Ambulatory Visit (HOSPITAL_COMMUNITY): Payer: Medicare Other

## 2016-11-09 ENCOUNTER — Encounter: Payer: Self-pay | Admitting: Physician Assistant

## 2016-11-09 ENCOUNTER — Telehealth: Payer: Self-pay | Admitting: Cardiology

## 2016-11-09 ENCOUNTER — Ambulatory Visit (INDEPENDENT_AMBULATORY_CARE_PROVIDER_SITE_OTHER): Payer: Medicare Other | Admitting: Physician Assistant

## 2016-11-09 VITALS — BP 144/78 | HR 70 | Ht 66.0 in | Wt 286.6 lb

## 2016-11-09 DIAGNOSIS — E785 Hyperlipidemia, unspecified: Secondary | ICD-10-CM | POA: Diagnosis not present

## 2016-11-09 DIAGNOSIS — I2510 Atherosclerotic heart disease of native coronary artery without angina pectoris: Secondary | ICD-10-CM

## 2016-11-09 DIAGNOSIS — I10 Essential (primary) hypertension: Secondary | ICD-10-CM

## 2016-11-09 DIAGNOSIS — I25119 Atherosclerotic heart disease of native coronary artery with unspecified angina pectoris: Secondary | ICD-10-CM

## 2016-11-09 NOTE — Telephone Encounter (Signed)
New Message   Pt had appt today, and per pt was told to call back with the following information when she went to the hospital:  MRN [id number] Dr. Lenon Curt [facility] cardiology [phone]

## 2016-11-09 NOTE — Telephone Encounter (Signed)
Forward to [employer]

## 2016-11-09 NOTE — Patient Instructions (Signed)
Medication Instructions:  Your physician recommends that you continue on your current medications as directed. Please refer to the Current Medication list given to you today.  If you need a refill on your cardiac medications before your next appointment, please call your pharmacy.  Labwork: CMET AND LIPID PANEL TODAY AT [facility] ON THE 1ST FLOOR  Follow-Up: Your physician wants you to follow-up in: 2 MONTHS WITH DR [facility] OR NEXT AVAILABLE.   Special Instructions: PLEASE FOLLOW 2,000 MG SODIUM DIET DAILY  DRINK 2 BOTTLES OF WATER DAILY  LOG DAILY WEIGHTS  Thank you for choosing [facility] at [facility]!!   RHONDA BARRETT, PA-C Sharyn Lull, [place]

## 2016-11-09 NOTE — Progress Notes (Signed)
Cardiology Office Note   Date:  11/09/2016   ID:  Doaa Kendzierski, DOB [birth year], MRN [id number]  PCP:  Briscoe Deutscher, DO  Cardiologist:  Dr. Percival Spanish 10/03/2016 Ignacia Bayley, NP, 10/11/2016  Rosaria Ferries, PA-C   Chief Complaint  Patient presents with  . Follow-up  . Shortness of Breath    History of Present Illness: Alisha [name] is a 66 y.o. female with a history of stents x 2 in [place] approximately 06/2016, HTN, HLD, GERD, OA, asthma, morbid obesity, fibromyalgia.   At the 10/03/2016 and 10/11/2016 office visits patient was describing chest pain, some improvement with Imdur. Symptoms were atypical and ischemic testing was not felt indicated  Alisha [name] presents for cardiology follow up.  She has not started her cardiac rehab yet, Neurologist wanted her to wait. She has a pituitary tumor, needs an MRI (rescheduled several times). She has had surgery and radiation for it, MRI is a routine follow up. She needs a vision test.   She has had some chest pain. Has not had to take SL NTG. She does not think it was due to her heart. She will get SOB easily, uses inhalers and nebs and sx (including chest pain) improve. She feels the respiratory meds make her a little jittery, this is unusual for her. She will discuss with her PCP.  She is having L shoulder problems, can only raise it to the level of her shoulder. She has appt with PCP tomorrow. The shoulder pain is making it harder for her to sleep.   She will need a refill on her morphine, that helps her chronic pain issues. She does not ask [place] for this.  She drinks V8 juice daily. She also drinks water. She does not weigh daily, but can start. She has cut down on salt, her daughter helps her be compliant with her diet.   She is compliant with her medications. She has had mild lower extremity edema, not severe. This is mostly during the day. She feels the Lasix takes care of most of this.  She has not eaten today.  She has not had a cholesterol profile since she moved to [place].    Past Medical History:  Diagnosis Date  . Arthritis   . Asthma   . CAD (coronary artery disease) 07/07/2016   a. s/p stenting x 2 in [place] to the LAD, RCA is chronically occluded, LVEDP was 34  . Depression   . Fibromyalgia   . GERD (gastroesophageal reflux disease)   . History of benign pituitary tumor   . Hyperlipidemia   . Hypertension   . Migraine   . Morbid obesity ([place])   . Vertigo     Past Surgical History:  Procedure Laterality Date  . carpel tunnel release Bilateral 2017  . EXCISION / CURETTAGE BONE CYST PHALANGES OF FOOT  2014   Removal of foot cyst   . PARTIAL HYSTERECTOMY  unknown   Patient still has ovaries  . pituitary tumor removal  1999  . TONSILLECTOMY    . TOTAL KNEE ARTHROPLASTY     TKR X 3    Medication Sig  . albuterol (PROVENTIL HFA;VENTOLIN HFA) 108 (90 Base) MCG/ACT inhaler Inhale 2 puffs into the lungs every 6 (six) hours as needed for wheezing or shortness of breath.  [place] Kitchen amitriptyline (ELAVIL) 75 MG tablet Take 1 tablet (75 mg total) by mouth at bedtime.  [place] Kitchen amLODipine (NORVASC) 10 MG tablet Take 1 tablet (10 mg total) by  mouth daily.  [place] Kitchen ammonium lactate (LAC-HYDRIN) 12 % lotion Apply 1 application topically as needed for dry skin.  [place] Kitchen aspirin EC 81 MG tablet Take 1 tablet (81 mg total) by mouth daily.  . budesonide-formoterol (SYMBICORT) 160-4.5 MCG/ACT inhaler Inhale 2 puffs into the lungs 2 (two) times daily.  . Cholecalciferol 2000 units CAPS Take 1 capsule by mouth daily.   . Diclofenac Sodium 1.5 % SOLN Place onto the skin 4 (four) times daily as needed.   . furosemide (LASIX) 20 MG tablet Take 2 tablets (40 mg total) by mouth daily.  [place] Kitchen gabapentin (NEURONTIN) 600 MG tablet Take 1 tablet (600 mg total) by mouth 3 (three) times daily.  [place] Kitchen guaiFENesin (MUCINEX) 600 MG 12 hr tablet Take 600 mg by mouth daily.   . isosorbide mononitrate (IMDUR) 30 MG 24 hr tablet Take 1  tablet (30 mg total) by mouth daily.  [place] Kitchen leflunomide (ARAVA) 20 MG tablet Take 1 tablet (20 mg total) by mouth daily.  [place] Kitchen lisinopril (PRINIVIL,ZESTRIL) 40 MG tablet Take 1 tablet (40 mg total) by mouth daily.  [place] Kitchen loratadine (CLARITIN) 10 MG tablet Take 1 tablet (10 mg total) by mouth daily.  . metoprolol succinate (TOPROL-XL) 50 MG 24 hr tablet Take 1 tablet (50 mg total) by mouth daily. Take with or immediately following a meal.  . mirabegron ER (MYRBETRIQ) 25 MG TB24 tablet Take 1 tablet (25 mg total) by mouth daily.  [place] Kitchen morphine (MS CONTIN) 15 MG 12 hr tablet Take 1 tablet (15 mg total) by mouth 3 (three) times daily.  . nitroGLYCERIN (NITROSTAT) 0.4 MG SL tablet Place 1 tablet (0.4 mg total) under the tongue every 5 (five) minutes as needed for chest pain.  [place] Kitchen oxyCODONE-acetaminophen (ROXICET) 5-325 MG tablet Take 1 tablet by mouth every 8 (eight) hours as needed for severe pain.  . rizatriptan (MAXALT) 10 MG tablet Take 1 tablet for headache.  May repeat once in 2 hours if needed.  Do not exceed 2 tablets in 24 hours.  . sertraline (ZOLOFT) 25 MG tablet Take 1 tablet (25 mg total) by mouth daily.  . ticagrelor (BRILINTA) 90 MG TABS tablet Take 1 tablet (90 mg total) by mouth 2 (two) times daily.  [place] Kitchen triamcinolone ointment (KENALOG) 0.1 % Apply 1 application topically 2 (two) times daily.   No current facility-administered medications for this visit.     Allergies:   Ampicillin; Asa [aspirin]; Darvon [propoxyphene]; Salicylates; and Tetracyclines & related    Social History:  The patient  reports that she has never smoked. She has never used smokeless tobacco. She reports that she drinks alcohol. She reports that she does not use drugs.   Family History:  The patient's family history includes Cancer in her father; Heart disease in her sister; Heart disease (age of onset: 97) in her mother.    ROS:  Please see the history of present illness. All other systems are reviewed and negative.     PHYSICAL EXAM: VS:  BP (!) 144/78   Pulse 70   Ht 5\' 6"  (1.676 m)   Wt 286 lb 9.6 oz (130 kg)   BMI 46.26 kg/m  , BMI Body mass index is 46.26 kg/m. GEN: Well nourished, well developed, female in no acute distress  HEENT: normal for age  Neck: Mild JVD, no carotid bruit, no masses Cardiac: RRR; soft murmur, no rubs, or gallops Respiratory:  Decreased breath sounds bases with few rales bilaterally, normal work of breathing GI: soft, nontender, nondistended, +  BS MS: no deformity or atrophy; trace edema; distal pulses are 2+ in all 4 extremities   Skin: warm and dry, no rash Neuro:  Strength and sensation are intact Psych: euthymic mood, full affect   EKG:  EKG is not ordered today.  Recent Labs: 10/19/2016: BUN 16; Creat 0.84; Potassium 4.1; Sodium 143    Lipid Panel No results found for: CHOL, TRIG, HDL, CHOLHDL, VLDL, LDLCALC, LDLDIRECT   Wt Readings from Last 3 Encounters:  11/09/16 286 lb 9.6 oz (130 kg)  11/02/16 289 lb 7.4 oz (131.3 kg)  10/26/16 291 lb (132 kg)     Other studies Reviewed: Additional studies/ records that were reviewed today include: Previous office notes and records faxed from [place].  ASSESSMENT AND PLAN:  1.  Hypertension: Her blood pressure is elevated today, but she states she has not eaten and so has not taken her medications. Therefore, I will not make any medications, continue current medical therapy at this time. She is encouraged to start daily weights and limit her sodium. According to the patient, she drinks an appropriate amount of water daily, she is encouraged not to increase this.  2. CAD: She is not having any ischemic symptoms. Her EF was normal at her catheterization, an echo was also performed which showed a normal EF. Diastolic dysfunction was not mentioned although her LVEDP was elevated at cath. Her chest pain is atypical. The patient agrees that her chest pain is most likely not cardiac. She is to continue current therapy for  this. She is to stay on the aspirin, Imdur, lisinopril, metoprolol.  3. Lipid status. Her lipid status is unknown. She has not eaten today. We will go ahead and check a lipid profile as well as a complete metabolic profile to clarify this. We'll review her records thoroughly to make sure there is no contraindication to statin and then we will start 1. Op note, she was on Mevacor when she first got to the hospital.   Current medicines are reviewed at length with the patient today.  The patient does not have concerns regarding medicines.  The following changes have been made:  no change  Labs/ tests ordered today include:   Orders Placed This Encounter  Procedures  . Comprehensive metabolic panel  . Lipid panel     Disposition:   FU with Dr. Percival Spanish  Signed, Rosaria Ferries, PA-C  11/09/2016 6:01 PM    [place] Phone: [phone]; Fax: [phone]  This note was written with the assistance of speech recognition software. Please excuse any transcriptional errors.

## 2016-11-10 ENCOUNTER — Inpatient Hospital Stay (HOSPITAL_COMMUNITY)
Admission: RE | Admit: 2016-11-10 | Discharge: 2016-11-10 | Disposition: A | Discharge status: Home / Self Care | Payer: Medicare Other | Source: Ambulatory Visit

## 2016-11-10 ENCOUNTER — Encounter: Payer: Self-pay | Admitting: Family Medicine

## 2016-11-10 ENCOUNTER — Ambulatory Visit (INDEPENDENT_AMBULATORY_CARE_PROVIDER_SITE_OTHER): Payer: Medicare Other | Admitting: Family Medicine

## 2016-11-10 VITALS — BP 120/74 | HR 79 | Temp 98.2°F

## 2016-11-10 DIAGNOSIS — J4540 Moderate persistent asthma, uncomplicated: Secondary | ICD-10-CM

## 2016-11-10 DIAGNOSIS — N3281 Overactive bladder: Secondary | ICD-10-CM

## 2016-11-10 DIAGNOSIS — M79672 Pain in left foot: Secondary | ICD-10-CM

## 2016-11-10 DIAGNOSIS — M62830 Muscle spasm of back: Secondary | ICD-10-CM

## 2016-11-10 DIAGNOSIS — G894 Chronic pain syndrome: Secondary | ICD-10-CM

## 2016-11-10 DIAGNOSIS — I25119 Atherosclerotic heart disease of native coronary artery with unspecified angina pectoris: Secondary | ICD-10-CM | POA: Diagnosis not present

## 2016-11-10 MED ORDER — LIDOCAINE 5 % EX PTCH: 1.0000 | MEDICATED_PATCH | CUTANEOUS | 0 refills | Status: DC

## 2016-11-10 MED ORDER — ALBUTEROL SULFATE (2.5 MG/3ML) 0.083% IN NEBU: 2.5000 mg | INHALATION_SOLUTION | Freq: Four times a day (QID) | RESPIRATORY_TRACT | 1 refills | Status: AC | PRN

## 2016-11-10 MED ORDER — MORPHINE SULFATE ER 15 MG PO TBCR: 15.0000 mg | EXTENDED_RELEASE_TABLET | Freq: Three times a day (TID) | ORAL | 0 refills | Status: DC

## 2016-11-10 NOTE — Progress Notes (Signed)
Alisha [name] is a 66 y.o. female here for:  History of Present Illness:   HPI:  1. Muscle spasm of back. NEW. Left side, x 1-2 weeks, no known injury or overuse. Pain radiates from left neck to left elbow. She has a difficult time raising her left arm. No Hx of the same. Her current pain medications do not help.   2. Chronic pain syndrome. Travelling to [place] with her daughter and will need refill while on the trip. Tolerating medication without side effects. Generally controls pain well.    3. Moderate persistent asthma without complication. Concern re: whether she was using Symbicort or Albuterol as maintenance inhaler. Patient is certain that she uses Symbicort but will bring in medication at next visit.    4. Left foot pain. Still waiting for referral to Orthopedics. Hx of surgery, with changed anatomy. "I feel like a pin is wanting to come out,"    5. OAB (overactive bladder). Needs assistance obtaining daily personal care items - Depends, Chucks.     PMHx, SurgHx, SocialHx, Medications, and Allergies were reviewed in the Visit Navigator and updated as appropriate.  Current Medications:   Current Outpatient Prescriptions:  .  albuterol (PROVENTIL HFA;VENTOLIN HFA) 108 (90 Base) MCG/ACT inhaler, Inhale 2 puffs into the lungs every 6 (six) hours as needed for wheezing or shortness of breath., Disp: 1 Inhaler, Rfl: 6 .  amitriptyline (ELAVIL) 75 MG tablet, Take 1 tablet (75 mg total) by mouth at bedtime., Disp: 30 tablet, Rfl: 0 .  amLODipine (NORVASC) 10 MG tablet, Take 1 tablet (10 mg total) by mouth daily., Disp: 90 tablet, Rfl: 1 .  ammonium lactate (LAC-HYDRIN) 12 % lotion, Apply 1 application topically as needed for dry skin., Disp: 400 g, Rfl: 1 .  aspirin EC 81 MG tablet, Take 1 tablet (81 mg total) by mouth daily., Disp: 90 tablet, Rfl: 1 .  budesonide-formoterol (SYMBICORT) 160-4.5 MCG/ACT inhaler, Inhale 2 puffs into the lungs 2 (two) times daily., Disp: 1 Inhaler, Rfl: 6 .   Cholecalciferol 2000 units CAPS, Take 1 capsule by mouth daily. , Disp: , Rfl:  .  Diclofenac Sodium 1.5 % SOLN, Place onto the skin 4 (four) times daily as needed. , Disp: , Rfl:  .  furosemide (LASIX) 20 MG tablet, Take 2 tablets (40 mg total) by mouth daily., Disp: 60 tablet, Rfl: 3 .  gabapentin (NEURONTIN) 600 MG tablet, Take 1 tablet (600 mg total) by mouth 3 (three) times daily., Disp: 90 tablet, Rfl: 3 .  guaiFENesin (MUCINEX) 600 MG 12 hr tablet, Take 600 mg by mouth daily. , Disp: , Rfl:  .  isosorbide mononitrate (IMDUR) 30 MG 24 hr tablet, Take 1 tablet (30 mg total) by mouth daily., Disp: 90 tablet, Rfl: 3 .  leflunomide (ARAVA) 20 MG tablet, Take 1 tablet (20 mg total) by mouth daily., Disp: 30 tablet, Rfl: 0 .  lisinopril (PRINIVIL,ZESTRIL) 40 MG tablet, Take 1 tablet (40 mg total) by mouth daily., Disp: 90 tablet, Rfl: 1 .  loratadine (CLARITIN) 10 MG tablet, Take 1 tablet (10 mg total) by mouth daily., Disp: 30 tablet, Rfl: 6 .  metoprolol succinate (TOPROL-XL) 50 MG 24 hr tablet, Take 1 tablet (50 mg total) by mouth daily. Take with or immediately following a meal., Disp: 90 tablet, Rfl: 1 .  mirabegron ER (MYRBETRIQ) 25 MG TB24 tablet, Take 1 tablet (25 mg total) by mouth daily., Disp: 30 tablet, Rfl: 2 .  morphine (MS CONTIN) 15 MG 12 hr  tablet, Take 1 tablet (15 mg total) by mouth 3 (three) times daily., Disp: 90 tablet, Rfl: 0 .  nitroGLYCERIN (NITROSTAT) 0.4 MG SL tablet, Place 1 tablet (0.4 mg total) under the tongue every 5 (five) minutes as needed for chest pain., Disp: 25 tablet, Rfl: 3 .  oxyCODONE-acetaminophen (ROXICET) 5-325 MG tablet, Take 1 tablet by mouth every 8 (eight) hours as needed for severe pain., Disp: 90 tablet, Rfl: 0 .  rizatriptan (MAXALT) 10 MG tablet, Take 1 tablet for headache.  May repeat once in 2 hours if needed.  Do not exceed 2 tablets in 24 hours., Disp: 10 tablet, Rfl: 3 .  sertraline (ZOLOFT) 25 MG tablet, Take 1 tablet (25 mg total) by mouth  daily., Disp: 30 tablet, Rfl: 2 .  ticagrelor (BRILINTA) 90 MG TABS tablet, Take 1 tablet (90 mg total) by mouth 2 (two) times daily., Disp: 180 tablet, Rfl: 1 .  triamcinolone ointment (KENALOG) 0.1 %, Apply 1 application topically 2 (two) times daily., Disp: , Rfl:     Review of Systems:   Review of Systems  Constitutional: Negative for chills and fever.  HENT: Positive for congestion. Negative for sinus pain and sore throat.        Some congestion for 1 week   Eyes: Negative for blurred vision and double vision.  Respiratory: Positive for cough.        Dry cough  Cardiovascular: Negative for chest pain, palpitations and leg swelling.  Gastrointestinal: Positive for nausea. Negative for abdominal pain, blood in stool, constipation, diarrhea, heartburn, melena and vomiting.       Nausea for 1 week from cough  Genitourinary: Negative for dysuria.  Musculoskeletal: Positive for joint pain and myalgias.       Left arm pain from shoulder to elbow for 2 weeks  Skin: Negative for rash.  Neurological: Negative for weakness and headaches.  Psychiatric/Behavioral: Negative for depression and memory loss.    Vitals:   Today's Vitals   11/10/16 1032  BP: 120/74  Pulse: 79  Temp: 98.2 F (36.8 C)  TempSrc: Oral    Physical Exam:   Physical Exam  Constitutional: She is oriented to person, place, and time. She appears well-developed and well-nourished.  HENT:  Head: Normocephalic.  Cardiovascular: Normal rate and regular rhythm.   Pulmonary/Chest: Effort normal.  Abdominal: Soft.  Musculoskeletal:  Neck: FROM. Left shoulder with limited abduction due to pain. TTP left upper trapezius, levator scapula. No AC ttp. No impingement sign.  Neurological: She is alert and oriented to person, place, and time.  Skin: Skin is warm.  Nursing note and vitals reviewed.   Assessment and Plan:   Quanasia was seen today for shoulder pain and asthma.  Diagnoses and all orders for this  visit:  Muscle spasm of back Comments: New.  After discussion, patient would like to start below medication. Expectations, risks, and potential side effects reviewed.  Orders: -     lidocaine (LIDODERM) 5 %; Place 1 patch onto the skin daily. Remove & Discard patch within 12 hours or as directed by MD  Chronic pain syndrome Comments: Refill today. Orders: -     morphine (MS CONTIN) 15 MG 12 hr tablet; Take 1 tablet (15 mg total) by mouth 3 (three) times daily.  Moderate persistent asthma without complication Comments: Reviewed inhaler use with patient. Encouraged daily use of Symicort. Use albuterol sparingly. Encouraged her to bring medications to next visit. Orders: -     albuterol (PROVENTIL) (2.5 MG/3ML)  0.083% nebulizer solution; Take 3 mLs (2.5 mg total) by nebulization every 6 (six) hours as needed for wheezing or shortness of breath.  Left foot pain Comments: Hx of surgery. Will need Orthopedics evaluation. Orders: -     AMB referral to orthopedics  OAB (overactive bladder) Comments: Patient in need of assistance obtaining supplies. Will work on this.    . Reviewed expectations re: course of current medical issues. . Discussed self-management of symptoms. . Outlined signs and symptoms indicating need for more acute intervention. . Patient verbalized understanding and all questions were answered. . See orders for this visit as documented in the electronic medical record. . Patient received an After-Visit Summary.   Shaune Pascal, CMA, acting as scribe for Dr. Juleen China.  CMA served as Education administrator during this visit. History, Physical, and Plan performed by medical provider. Documentation and orders reviewed and attested to. Briscoe Deutscher, D.O.   Briscoe Deutscher, D.O. [place], [facility]

## 2016-11-10 NOTE — Patient Instructions (Signed)
Patient Active Problem List   Diagnosis Date Noted  . Venous stasis of both lower extremities 10/04/2016  . Poor dentition 10/04/2016  . OSA (obstructive sleep apnea) 10/04/2016  . History of nephrolithiasis 10/04/2016  . Hx of pilonidal cyst 10/04/2016  . Bipolar depression ([place]) 10/04/2016  . Personal history of DVT (deep vein thrombosis) 10/04/2016  . Morbid obesity ([place]) 09/25/2016  . 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 Essential hypertension 09/25/2016  . 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 Vision impairment 09/25/2016  . Dysthymia 09/25/2016  . Chronic seasonal allergic rhinitis due to pollen 09/25/2016  . Moderate persistent asthma without complication 28/31/5176  . Gastroesophageal reflux disease without esophagitis 09/25/2016  . OAB (overactive bladder) 09/25/2016  . Chronic pain syndrome 09/25/2016

## 2016-11-10 NOTE — Progress Notes (Signed)
Pre visit review using our clinic review tool, if applicable. No additional management support is needed unless otherwise documented below in the visit note. 

## 2016-11-12 ENCOUNTER — Other Ambulatory Visit: Payer: Medicare Other

## 2016-11-13 ENCOUNTER — Telehealth: Payer: Self-pay | Admitting: Surgical

## 2016-11-13 ENCOUNTER — Inpatient Hospital Stay (HOSPITAL_COMMUNITY): Admission: RE | Admit: 2016-11-13 | Payer: Medicare Other | Source: Ambulatory Visit

## 2016-11-13 ENCOUNTER — Ambulatory Visit
Admission: RE | Admit: 2016-11-13 | Discharge: 2016-11-13 | Disposition: A | Discharge status: Home / Self Care | Payer: Medicare Other | Source: Ambulatory Visit | Attending: Neurology | Admitting: Neurology

## 2016-11-13 DIAGNOSIS — H53451 Other localized visual field defect, right eye: Secondary | ICD-10-CM

## 2016-11-13 DIAGNOSIS — D352 Benign neoplasm of pituitary gland: Secondary | ICD-10-CM

## 2016-11-13 DIAGNOSIS — G43009 Migraine without aura, not intractable, without status migrainosus: Secondary | ICD-10-CM

## 2016-11-13 MED ORDER — GADOBENATE DIMEGLUMINE 529 MG/ML IV SOLN
10.0000 mL | Freq: Once | INTRAVENOUS | Status: AC | PRN
  Administered 2016-11-13: 10 mL via INTRAVENOUS

## 2016-11-13 NOTE — Telephone Encounter (Signed)
Tried to call the patient to let her know that paperwork for her [employer] transportation will be placed up front. No answer and no voicemail set up.

## 2016-11-14 ENCOUNTER — Telehealth: Payer: Self-pay

## 2016-11-14 DIAGNOSIS — H539 Unspecified visual disturbance: Secondary | ICD-10-CM

## 2016-11-14 DIAGNOSIS — H53459 Other localized visual field defect, unspecified eye: Secondary | ICD-10-CM

## 2016-11-14 NOTE — Telephone Encounter (Signed)
-----   Message from Cameron Sprang, MD sent at 11/13/2016 12:35 PM EDT ----- Pls let her know the MRI brain did not show any evidence of new tumor, stroke, or bleed. It showed old changes from the prior surgery.

## 2016-11-14 NOTE — Telephone Encounter (Signed)
Referral was sent to [facility] on 02/28. Referral was recvd; pt had an appt on 03/02 and NS.  Showing Cardiac Rehab ordered by PCP.  Called patient to nofity. Will c/b after 2pm to give phone number for [place] [facility] #[phone] Pt was driving.

## 2016-11-14 NOTE — Telephone Encounter (Signed)
I don't see where dr. Tomi Likens ordered or stopped cardiac rehab?  Re: opth visit, it does look like he wanted her referred so please check on that.

## 2016-11-14 NOTE — Telephone Encounter (Signed)
Spoke to patient. Gave MRI results. Patient verbalized understanding. Would like referral sent or VO given to continue Cardiac Rehab. Says she was told to have vision acuity test but has not been contacted about ophthalmology referral.

## 2016-11-15 ENCOUNTER — Ambulatory Visit (INDEPENDENT_AMBULATORY_CARE_PROVIDER_SITE_OTHER): Payer: Medicare Other

## 2016-11-15 ENCOUNTER — Encounter (INDEPENDENT_AMBULATORY_CARE_PROVIDER_SITE_OTHER): Payer: Self-pay | Admitting: Orthopedic Surgery

## 2016-11-15 ENCOUNTER — Ambulatory Visit (HOSPITAL_COMMUNITY): Payer: Medicare Other

## 2016-11-15 ENCOUNTER — Ambulatory Visit (INDEPENDENT_AMBULATORY_CARE_PROVIDER_SITE_OTHER): Payer: Medicare Other | Admitting: Family

## 2016-11-15 ENCOUNTER — Other Ambulatory Visit: Payer: Medicare Other

## 2016-11-15 DIAGNOSIS — I25119 Atherosclerotic heart disease of native coronary artery with unspecified angina pectoris: Secondary | ICD-10-CM

## 2016-11-15 DIAGNOSIS — S92352A Displaced fracture of fifth metatarsal bone, left foot, initial encounter for closed fracture: Secondary | ICD-10-CM | POA: Diagnosis not present

## 2016-11-15 DIAGNOSIS — Z79899 Other long term (current) drug therapy: Secondary | ICD-10-CM | POA: Diagnosis not present

## 2016-11-15 DIAGNOSIS — M25572 Pain in left ankle and joints of left foot: Secondary | ICD-10-CM | POA: Diagnosis not present

## 2016-11-15 LAB — LIPID PANEL
Cholesterol: 172 mg/dL (ref <200)
HDL: 37 mg/dL — ABNORMAL LOW (ref >50)
LDL Cholesterol: 105 mg/dL — ABNORMAL HIGH (ref <100)
Total CHOL/HDL Ratio: 4.6 Ratio (ref <5.0)
Triglycerides: 149 mg/dL (ref <150)
VLDL: 30 mg/dL (ref <30)

## 2016-11-15 LAB — COMPREHENSIVE METABOLIC PANEL
ALK PHOS: 110 U/L (ref 33–130)
ALT: 15 U/L (ref 6–29)
AST: 16 U/L (ref 10–35)
Albumin: 3.7 g/dL (ref 3.6–5.1)
BILIRUBIN TOTAL: 0.6 mg/dL (ref 0.2–1.2)
BUN: 8 mg/dL (ref 7–25)
CO2: 26 mmol/L (ref 20–31)
CREATININE: 0.95 mg/dL (ref 0.50–0.99)
Calcium: 9 mg/dL (ref 8.6–10.4)
Chloride: 110 mmol/L (ref 98–110)
Glucose, Bld: 133 mg/dL — ABNORMAL HIGH (ref 65–99)
Potassium: 3.4 mmol/L — ABNORMAL LOW (ref 3.5–5.3)
SODIUM: 145 mmol/L (ref 135–146)
TOTAL PROTEIN: 6.4 g/dL (ref 6.1–8.1)

## 2016-11-16 NOTE — Telephone Encounter (Signed)
Called patient and provided [place] phone number.

## 2016-11-17 ENCOUNTER — Telehealth (HOSPITAL_COMMUNITY): Payer: Self-pay | Admitting: *Deleted

## 2016-11-17 ENCOUNTER — Telehealth: Payer: Self-pay | Admitting: Surgical

## 2016-11-17 ENCOUNTER — Telehealth: Payer: Self-pay

## 2016-11-17 ENCOUNTER — Inpatient Hospital Stay (HOSPITAL_COMMUNITY): Admission: RE | Admit: 2016-11-17 | Payer: Medicare Other | Source: Ambulatory Visit

## 2016-11-17 NOTE — Telephone Encounter (Signed)
Patient's daughter called with concerns about patient's medications.  Thinks patient may be taking too much of her medication or may not be taking the right medications.  States patient has been slurring her speech and acting "loopy".  Patient recently moved in with her daughter and daughter states she hasn't been aware of how patient has been doing until recently.  After a very lengthy conversation, advised that daughter accompany patient to an appointment with Dr. Juleen China to discuss these concerns.  Daughter agreed and patient is scheduled for an appointment on 11/20/2016 at 1:00 pm.  Advised daughter to take patient to ER or call 911 if necessary.  Daughter advised understanding.

## 2016-11-17 NOTE — Telephone Encounter (Signed)
Left message for Alisha [name] from [employer] to give me a call about applicants application. Forms will be on my desk.

## 2016-11-17 NOTE — Telephone Encounter (Signed)
-----   Message from [place], DO sent at 11/17/2016  8:45 AM EDT ----- She did reach out and I gave her the okay to proceed. ----- Message ----- From: Rowe Pavy, RN Sent: 11/17/2016   7:15 AM To: Eustace Quail Tat, DO  Please review telephone encounter with Dr. Tomi Likens.  Looks like [employer] account planner reached out to you seeking ok to proceed with Cardiac Rehab upon review of pt MRI.  If you would like I can follow up with Dr. Tomi Likens. Thanks Psychologist, clinical, BSN Cardiac and Training and development officer

## 2016-11-20 ENCOUNTER — Ambulatory Visit (INDEPENDENT_AMBULATORY_CARE_PROVIDER_SITE_OTHER): Payer: Medicare Other | Admitting: Family Medicine

## 2016-11-20 ENCOUNTER — Telehealth: Payer: Self-pay | Admitting: Family Medicine

## 2016-11-20 ENCOUNTER — Telehealth (HOSPITAL_COMMUNITY): Payer: Self-pay | Admitting: *Deleted

## 2016-11-20 ENCOUNTER — Ambulatory Visit (HOSPITAL_COMMUNITY): Payer: Medicare Other

## 2016-11-20 ENCOUNTER — Encounter: Payer: Self-pay | Admitting: Family Medicine

## 2016-11-20 DIAGNOSIS — Z79899 Other long term (current) drug therapy: Secondary | ICD-10-CM | POA: Diagnosis not present

## 2016-11-20 DIAGNOSIS — Z0000 Encounter for general adult medical examination without abnormal findings: Secondary | ICD-10-CM

## 2016-11-20 DIAGNOSIS — N3281 Overactive bladder: Secondary | ICD-10-CM | POA: Diagnosis not present

## 2016-11-20 DIAGNOSIS — F319 Bipolar disorder, unspecified: Secondary | ICD-10-CM

## 2016-11-20 DIAGNOSIS — G894 Chronic pain syndrome: Secondary | ICD-10-CM | POA: Diagnosis not present

## 2016-11-20 DIAGNOSIS — I25119 Atherosclerotic heart disease of native coronary artery with unspecified angina pectoris: Secondary | ICD-10-CM | POA: Diagnosis not present

## 2016-11-20 DIAGNOSIS — F3130 Bipolar disorder, current episode depressed, mild or moderate severity, unspecified: Secondary | ICD-10-CM | POA: Diagnosis not present

## 2016-11-20 DIAGNOSIS — Z780 Asymptomatic menopausal state: Secondary | ICD-10-CM

## 2016-11-20 NOTE — Telephone Encounter (Addendum)
Spoke with Alisha [name] and the forms that need to be filled out did not come through. She is going to refax them.

## 2016-11-20 NOTE — Telephone Encounter (Signed)
-----   Message from Pieter Partridge, DO sent at 11/19/2016  1:13 PM EDT ----- Regarding: RE: Ok to participate in cardiac rehab Hello Ms. Carlton,  I have no restrictions for Ms. Bjelland in regards to her cardiac rehab.  Metta Clines ----- Message ----- From: Rowe Pavy, RN Sent: 11/01/2016   5:58 PM To: Pieter Partridge, DO Subject: Ok to participate in cardiac rehab             Dr. Tomi Likens  The above pt is eligible to participate in cardiac rehab s/p MI (in [place]).  Pt recently seen by you in consult to establish care.  Pt has benign pituitary tumor, migraine and vertigo.  Pt has upcoming MRI on 3/13.  Per your assessment, may patient participate in cardiac rehab?  If so, any restrictions of activities or exercise pt should avoid?   Thank you for your input.  Maurice Small RN

## 2016-11-20 NOTE — Progress Notes (Signed)
Subjective:   Marishka Rentfrow is a 66 y.o. female who presents for an Initial Medicare Annual Wellness Visit.  THN Consult Placed.  Review of Systems    No ROS.  Medicare Wellness Visit.  Cardiac Risk Factors include: advanced age (>48men, >33 women);sedentary lifestyle;obesity (BMI >30kg/m2);hypertension;dyslipidemia   Sleep patterns:Sometimes does not get any sleep, sometimes lays in bed all day. Has to get up several times during night to use restroom. Home Safety/Smoke Alarms:  Feels safe in home. Smoke alarms in place.  Living environment; residence and Firearm Safety: one story home. Uses walker and cane. Seat Belt Safety/Bike Helmet: Wears seat belt.   Counseling:   Eye Exam- yearly  Female:   Pap-   Hysterectomy.    Mammo-  2017. Normal per pt.      Dexa scan- Order placed.  CCS- 2015 or 2016, Polyps removed per pt.      Objective:    There were no vitals filed for this visit. There is no height or weight on file to calculate BMI.   Current Medications (verified) Outpatient Encounter Prescriptions as of 11/20/2016  Medication Sig  . albuterol (PROVENTIL HFA;VENTOLIN HFA) 108 (90 Base) MCG/ACT inhaler Inhale 2 puffs into the lungs every 6 (six) hours as needed for wheezing or shortness of breath.  Marland Kitchen albuterol (PROVENTIL) (2.5 MG/3ML) 0.083% nebulizer solution Take 3 mLs (2.5 mg total) by nebulization every 6 (six) hours as needed for wheezing or shortness of breath.  Marland Kitchen amLODipine (NORVASC) 10 MG tablet Take 1 tablet (10 mg total) by mouth daily.  Marland Kitchen aspirin EC 81 MG tablet Take 1 tablet (81 mg total) by mouth daily.  . budesonide-formoterol (SYMBICORT) 160-4.5 MCG/ACT inhaler Inhale 2 puffs into the lungs 2 (two) times daily.  . furosemide (LASIX) 20 MG tablet Take 2 tablets (40 mg total) by mouth daily. (Patient taking differently: Take 20 mg by mouth 2 (two) times daily. )  . gabapentin (NEURONTIN) 600 MG tablet Take 1 tablet (600 mg total) by mouth 3 (three)  times daily.  . isosorbide mononitrate (IMDUR) 30 MG 24 hr tablet Take 1 tablet (30 mg total) by mouth daily.  Marland Kitchen leflunomide (ARAVA) 20 MG tablet Take 1 tablet (20 mg total) by mouth daily.  Marland Kitchen lisinopril (PRINIVIL,ZESTRIL) 40 MG tablet Take 1 tablet (40 mg total) by mouth daily.  Marland Kitchen loratadine (CLARITIN) 10 MG tablet Take 1 tablet (10 mg total) by mouth daily.  . metoprolol succinate (TOPROL-XL) 50 MG 24 hr tablet Take 1 tablet (50 mg total) by mouth daily. Take with or immediately following a meal.  . mirabegron ER (MYRBETRIQ) 25 MG TB24 tablet Take 1 tablet (25 mg total) by mouth daily.  Marland Kitchen morphine (MS CONTIN) 15 MG 12 hr tablet Take 1 tablet (15 mg total) by mouth 3 (three) times daily.  . nitroGLYCERIN (NITROSTAT) 0.4 MG SL tablet Place 1 tablet (0.4 mg total) under the tongue every 5 (five) minutes as needed for chest pain.  Marland Kitchen oxyCODONE-acetaminophen (ROXICET) 5-325 MG tablet Take 1 tablet by mouth every 8 (eight) hours as needed for severe pain.  . rizatriptan (MAXALT) 10 MG tablet Take 1 tablet for headache.  May repeat once in 2 hours if needed.  Do not exceed 2 tablets in 24 hours.  . sertraline (ZOLOFT) 25 MG tablet Take 1 tablet (25 mg total) by mouth daily.  . ticagrelor (BRILINTA) 90 MG TABS tablet Take 1 tablet (90 mg total) by mouth 2 (two) times daily.  . [DISCONTINUED] amitriptyline (  ELAVIL) 75 MG tablet Take 1 tablet (75 mg total) by mouth at bedtime.  . [DISCONTINUED] Diclofenac Sodium 1.5 % SOLN Place onto the skin 4 (four) times daily as needed.   . [DISCONTINUED] guaiFENesin (MUCINEX) 600 MG 12 hr tablet Take 600 mg by mouth daily.   Marland Kitchen lidocaine (LIDODERM) 5 % Place 1 patch onto the skin daily. Remove & Discard patch within 12 hours or as directed by MD (Patient not taking: Reported on 11/20/2016)  . [DISCONTINUED] ammonium lactate (LAC-HYDRIN) 12 % lotion Apply 1 application topically as needed for dry skin. (Patient not taking: Reported on 11/20/2016)  . [DISCONTINUED]  Cholecalciferol 2000 units CAPS Take 1 capsule by mouth daily.   . [DISCONTINUED] triamcinolone ointment (KENALOG) 0.1 % Apply 1 application topically 2 (two) times daily.   No facility-administered encounter medications on file as of 11/20/2016.     Allergies (verified) Ampicillin; Asa [aspirin]; Darvon [propoxyphene]; Salicylates; and Tetracyclines & related   History: Past Medical History:  Diagnosis Date  . Arthritis   . Asthma   . CAD (coronary artery disease) 07/07/2016   a. s/p stenting x 2 in [place] to the LAD, RCA is chronically occluded, LVEDP was 34  . Depression   . Fibromyalgia   . GERD (gastroesophageal reflux disease)   . History of benign pituitary tumor   . Hyperlipidemia   . Hypertension   . Migraine   . Morbid obesity ([place])   . Vertigo    Past Surgical History:  Procedure Laterality Date  . carpel tunnel release Bilateral 2017  . EXCISION / CURETTAGE BONE CYST PHALANGES OF FOOT  2014   Removal of foot cyst   . PARTIAL HYSTERECTOMY  unknown   Patient still has ovaries  . pituitary tumor removal  1999  . TONSILLECTOMY    . TOTAL KNEE ARTHROPLASTY     TKR X 3   Family History  Problem Relation Age of Onset  . Heart disease Mother 39    CABG, pacemaker, valve  . Cancer Father   . Heart disease Sister     Fluid around the heart   Social History   Occupational History  . Not on file.   Social History Main Topics  . Smoking status: Never Smoker  . Smokeless tobacco: Never Used  . Alcohol use Yes     Comment: Socially. [name].  . Drug use: No  . Sexual activity: No    Tobacco Counseling Counseling given: Not Answered   Activities of Daily Living In your present state of health, do you have any difficulty performing the following activities: 11/20/2016  Hearing? Y  Vision? N  Difficulty concentrating or making decisions? Y  Walking or climbing stairs? Y  Dressing or bathing? N  Doing errands, shopping? N  Preparing Food and eating ?  N  Using the Toilet? N  In the past six months, have you accidently leaked urine? Y  Do you have problems with loss of bowel control? N  Managing your Medications? Y  Managing your Finances? N  Housekeeping or managing your Housekeeping? N    Immunizations and Health Maintenance Immunization History  Administered Date(s) Administered  . Influenza-Unspecified 06/03/2010, 06/21/2011, 06/04/2012  . Pneumococcal Polysaccharide-23 06/22/2006, 07/25/2011  . Td 04/21/2001  . Tdap 05/16/2013  . Zoster 06/17/2012   Health Maintenance Due  Topic Date Due  . Hepatitis C Screening  1951-08-24  . MAMMOGRAM  05/23/1999  . COLONOSCOPY  05/23/1999  . DEXA SCAN  05/22/2014  .  PNA vac Low Risk Adult (1 of 2 - PCV13) 05/22/2014    Patient Care Team: Briscoe Deutscher, DO as PCP - General (Family Medicine)  Indicate any recent Medical Services you may have received from other than [facility] providers in the past year (date may be approximate).     Assessment:   This is a routine wellness examination for Hal[place]. Physical assessment deferred to PCP.  Hearing/Vision screen Hearing Screening Comments: Needs referral Vision Screening Comments: Pituitary tumors. Recent MRI. Referral has been placed for eye dr.   Dietary issues and exercise activities discussed: Current Exercise Habits: The patient does not participate in regular exercise at present, Exercise limited by: None identified Diet (meal preparation, eat out, water intake, caffeinated beverages, dairy products, fruits and vegetables): 2 meals/day. Bagged Cheerios, PB, crackers, fresh fruit for snack. Sometimes skips meals. Breakfast: skips. Cereal or cheese and crackers. Lunch: same as above   Goals    . Be more independent when ambulating      Depression Screen PHQ 2/9 Scores 11/20/2016 09/25/2016  PHQ - 2 Score 4 1  PHQ- 9 Score 14 -    Fall Risk Fall Risk  11/20/2016 10/25/2016 09/25/2016  Falls in the past year? Yes Yes Yes  Number  falls in past yr: 2 or more 2 or more 2 or more  Injury with Fall? No Yes Yes  Risk Factor Category  High Fall Risk High Fall Risk High Fall Risk  Risk for fall due to : - Impaired balance/gait;Impaired mobility -  Follow up - Falls evaluation completed;Education provided;Falls prevention discussed -    Cognitive Function: MMSE - Mini Mental State Exam 11/20/2016  Orientation to time 5  Orientation to Place 5  Registration 3  Attention/ Calculation 5  Recall 3  Language- name 2 objects 2  Language- repeat 1  Language- follow 3 step command 3  Language- read & follow direction 1  Write a sentence 1  Copy design 1  Total score 30        Screening Tests Health Maintenance  Topic Date Due  . Hepatitis C Screening  07/15/1951  . MAMMOGRAM  05/23/1999  . COLONOSCOPY  05/23/1999  . DEXA SCAN  05/22/2014  . PNA vac Low Risk Adult (1 of 2 - PCV13) 05/22/2014  . INFLUENZA VACCINE  10/19/2017 (Originally 03/28/2016)  . TETANUS/TDAP  05/17/2023      Plan:   Follow up with PCP as directed.  THN order placed.  During the course of the visit, Emina was educated and counseled about the following appropriate screening and preventive services:   Vaccines to include Pneumoccal, Influenza, Hepatitis B, Td, Zostavax, HCV  Electrocardiogram  Cardiovascular disease screening  Colorectal cancer screening  Bone density screening - Order placed  Diabetes screening  Glaucoma screening  Mammography/PAP  Nutrition counseling  Smoking cessation counseling  Patient Instructions (the written plan) were given to the patient.    Ree Edman, RN   11/20/2016    I have personally reviewed the Medicare Annual Wellness questionnaire and have noted 1. The patient's medical and social history 2. Their use of alcohol, tobacco or illicit drugs 3. Their current medications and supplements 4. The patient's functional ability including ADL's, fall risks, home safety risks and  hearing or visual impairment. 5. Diet and physical activities 6. Evidence for depression or mood disorders 7. Reviewed Updated provider list, see scanned forms and [facility] Snapshot.   The patients weight, height, BMI and visual acuity have been recorded in the  chart I have made referrals, counseling and provided education to the patient based review of the above and I have provided the pt with a written personalized care plan for preventive services.  Briscoe Deutscher, D.O. [place], [facility]

## 2016-11-20 NOTE — Progress Notes (Signed)
Pre visit review using our clinic review tool, if applicable. No additional management support is needed unless otherwise documented below in the visit note. 

## 2016-11-20 NOTE — Progress Notes (Signed)
Alisha [name] is a 66 y.o. female is here to discuss:  History of Present Illness:  Water quality scientist, CMA, acting as scribe for Dr. Juleen China.  Chief Complaint  Patient presents with  . Follow-up    Med check   HPI: The patient presents with her daughter today to review medications. This is the first time that I have been able to meet the patient's daughter and caregiver as the patient generally comes to visits on her own without difficulty. The daughter states that she is concerned that her mother has started to seem sedated, slurring words, "out of it." There is concern that she may have started a medication that she was not truly taking prior to moving to [facility].   Prior to our visit, the patient and her daughter met with out Health Coach for over one hour to review medications and her home situation.   Health Maintenance Due  Topic Date Due  . Hepatitis C Screening  April 16, 1951  . MAMMOGRAM  05/23/1999  . COLONOSCOPY  05/23/1999  . DEXA SCAN  05/22/2014  . PNA vac Low Risk Adult (1 of 2 - PCV13) 05/22/2014    PMHx, SurgHx, SocialHx, FamHx, Medications, and Allergies were reviewed in the Visit Navigator and updated as appropriate.   Patient Active Problem List   Diagnosis Date Noted  . Venous stasis of both lower extremities 10/04/2016  . Poor dentition 10/04/2016  . OSA (obstructive sleep apnea) 10/04/2016  . History of nephrolithiasis 10/04/2016  . Hx of pilonidal cyst 10/04/2016  . Bipolar depression ([place]) 10/04/2016  . Personal history of DVT (deep vein thrombosis) 10/04/2016  . Morbid obesity ([place]) 09/25/2016  . 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 Essential hypertension 09/25/2016  . 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 Vision impairment 09/25/2016  . Dysthymia 09/25/2016  . Chronic seasonal allergic rhinitis due to pollen 09/25/2016  . Moderate persistent asthma without complication 86/76/7209  . Gastroesophageal reflux  disease without esophagitis 09/25/2016  . OAB (overactive bladder) 09/25/2016  . Chronic pain syndrome 09/25/2016    Social History  Substance Use Topics  . Smoking status: Never Smoker  . Smokeless tobacco: Never Used  . Alcohol use Yes     Comment: Socially. [name].    Current Medications and Allergies:    Current Outpatient Prescriptions:  .  albuterol (PROVENTIL HFA;VENTOLIN HFA) 108 (90 Base) MCG/ACT inhaler, Inhale 2 puffs into the lungs every 6 (six) hours as needed for wheezing or shortness of breath., Disp: 1 Inhaler, Rfl: 6 .  albuterol (PROVENTIL) (2.5 MG/3ML) 0.083% nebulizer solution, Take 3 mLs (2.5 mg total) by nebulization every 6 (six) hours as needed for wheezing or shortness of breath., Disp: 150 mL, Rfl: 1 .  amitriptyline (ELAVIL) 75 MG tablet, Take 1 tablet (75 mg total) by mouth at bedtime., Disp: 30 tablet, Rfl: 0 .  amLODipine (NORVASC) 10 MG tablet, Take 1 tablet (10 mg total) by mouth daily., Disp: 90 tablet, Rfl: 1 .  ammonium lactate (LAC-HYDRIN) 12 % lotion, Apply 1 application topically as needed for dry skin., Disp: 400 g, Rfl: 1 .  aspirin EC 81 MG tablet, Take 1 tablet (81 mg total) by mouth daily., Disp: 90 tablet, Rfl: 1 .  budesonide-formoterol (SYMBICORT) 160-4.5 MCG/ACT inhaler, Inhale 2 puffs into the lungs 2 (two) times daily., Disp: 1 Inhaler, Rfl: 6 .  Cholecalciferol 2000 units CAPS, Take 1 capsule by mouth daily. , Disp: , Rfl:  .  Diclofenac  Sodium 1.5 % SOLN, Place onto the skin 4 (four) times daily as needed. , Disp: , Rfl:  .  furosemide (LASIX) 20 MG tablet, Take 2 tablets (40 mg total) by mouth daily., Disp: 60 tablet, Rfl: 3 .  gabapentin (NEURONTIN) 600 MG tablet, Take 1 tablet (600 mg total) by mouth 3 (three) times daily., Disp: 90 tablet, Rfl: 3 .  guaiFENesin (MUCINEX) 600 MG 12 hr tablet, Take 600 mg by mouth daily. , Disp: , Rfl:  .  isosorbide mononitrate (IMDUR) 30 MG 24 hr tablet, Take 1 tablet (30 mg total) by mouth  daily., Disp: 90 tablet, Rfl: 3 .  leflunomide (ARAVA) 20 MG tablet, Take 1 tablet (20 mg total) by mouth daily., Disp: 30 tablet, Rfl: 0 .  lisinopril (PRINIVIL,ZESTRIL) 40 MG tablet, Take 1 tablet (40 mg total) by mouth daily., Disp: 90 tablet, Rfl: 1 .  loratadine (CLARITIN) 10 MG tablet, Take 1 tablet (10 mg total) by mouth daily., Disp: 30 tablet, Rfl: 6 .  metoprolol succinate (TOPROL-XL) 50 MG 24 hr tablet, Take 1 tablet (50 mg total) by mouth daily. Take with or immediately following a meal., Disp: 90 tablet, Rfl: 1 .  mirabegron ER (MYRBETRIQ) 25 MG TB24 tablet, Take 1 tablet (25 mg total) by mouth daily., Disp: 30 tablet, Rfl: 2 .  morphine (MS CONTIN) 15 MG 12 hr tablet, Take 1 tablet (15 mg total) by mouth 3 (three) times daily., Disp: 90 tablet, Rfl: 0 .  nitroGLYCERIN (NITROSTAT) 0.4 MG SL tablet, Place 1 tablet (0.4 mg total) under the tongue every 5 (five) minutes as needed for chest pain., Disp: 25 tablet, Rfl: 3 .  oxyCODONE-acetaminophen (ROXICET) 5-325 MG tablet, Take 1 tablet by mouth every 8 (eight) hours as needed for severe pain., Disp: 90 tablet, Rfl: 0 .  rizatriptan (MAXALT) 10 MG tablet, Take 1 tablet for headache.  May repeat once in 2 hours if needed.  Do not exceed 2 tablets in 24 hours., Disp: 10 tablet, Rfl: 3 .  sertraline (ZOLOFT) 25 MG tablet, Take 1 tablet (25 mg total) by mouth daily., Disp: 30 tablet, Rfl: 2 .  ticagrelor (BRILINTA) 90 MG TABS tablet, Take 1 tablet (90 mg total) by mouth 2 (two) times daily., Disp: 180 tablet, Rfl: 1 .  triamcinolone ointment (KENALOG) 0.1 %, Apply 1 application topically 2 (two) times daily., Disp: , Rfl:    Allergies  Allergen Reactions  . Ampicillin Nausea Only  . Asa [Aspirin] Diarrhea  . Darvon [Propoxyphene] Nausea And Vomiting  . Salicylates Other (See Comments)    Unknown   . Tetracyclines & Related Other (See Comments)    Nerves feeling     Review of Systems   Review of Systems  Constitutional: Negative  for chills and fever.  Respiratory: Positive for wheezing. Negative for cough.   Cardiovascular: Negative for chest pain, palpitations and leg swelling.  Gastrointestinal: Negative for abdominal pain, constipation, diarrhea, nausea and vomiting.  Musculoskeletal: Positive for myalgias.  Neurological: Positive for dizziness. Negative for focal weakness, seizures, loss of consciousness and headaches.  Psychiatric/Behavioral: Positive for memory loss. Negative for hallucinations, substance abuse and suicidal ideas.    Vitals:  There were no vitals filed for this visit.   There is no height or weight on file to calculate BMI.   Physical Exam:    Physical Exam  Constitutional: She appears well-developed and well-nourished. No distress.  HENT:  Head: Normocephalic and atraumatic.  Eyes: EOM are normal. Pupils are equal,  round, and reactive to light.  Neck: Normal range of motion. Neck supple.  Cardiovascular: Normal rate, regular rhythm, normal heart sounds and intact distal pulses.   Pulmonary/Chest: Effort normal.  Abdominal: Soft.  Skin: Skin is warm.  Psychiatric: She has a normal mood and affect. Her speech is not slurred. She is not slowed.  Nursing note and vitals reviewed.    Assessment and Plan:    Alisha [name] was seen today for follow-up and medicare wellness.  Diagnoses and all orders for this visit:  Encounter for Medicare annual wellness exam  Asymptomatic menopausal state -     DG Bone Density; Future  Bipolar depression (HCC) -     AMB Referral to [facility]  Chronic pain syndrome Comments: THe patient requested a refill of Elavil last week - refilled. However, it turns out that she had NOT been taking that medication. Will stop Elavil as it is likely the cause of her sedation today and throughout the last week.  Polypharmacy Comments: Worked on deprescribing today. Plan to continue with opiods over the next several months. Patient aware.      . Reviewed expectations re: course of current medical issues. . Discussed self-management of symptoms. . Outlined signs and symptoms indicating need for more acute intervention. . Patient verbalized understanding and all questions were answered. . See orders for this visit as documented in the electronic medical record. . Patient received an After Visit Summary.  CMA served as Education administrator during this visit. History, Physical, and Plan performed by medical provider. Documentation and orders reviewed and attested to. Briscoe Deutscher, D.O.  Briscoe Deutscher, [place], [place] 11/25/2016  Follow-up: No Follow-up on file.

## 2016-11-20 NOTE — Telephone Encounter (Signed)
Anderson Malta called back and is going to refax the forms that need to be filled out.

## 2016-11-20 NOTE — Telephone Encounter (Signed)
Alisha [name] with [employer] is calling regarding this case. Please give her a call back @ the # provided.

## 2016-11-20 NOTE — Telephone Encounter (Signed)
Left message for Anderson Malta from [employer] to return my call.

## 2016-11-22 ENCOUNTER — Inpatient Hospital Stay (HOSPITAL_COMMUNITY): Admission: RE | Admit: 2016-11-22 | Payer: Medicare Other | Source: Ambulatory Visit

## 2016-11-22 ENCOUNTER — Telehealth: Payer: Self-pay | Admitting: Family Medicine

## 2016-11-22 ENCOUNTER — Telehealth: Payer: Self-pay | Admitting: *Deleted

## 2016-11-22 DIAGNOSIS — E349 Endocrine disorder, unspecified: Secondary | ICD-10-CM

## 2016-11-22 DIAGNOSIS — Z79899 Other long term (current) drug therapy: Secondary | ICD-10-CM

## 2016-11-22 DIAGNOSIS — Z1329 Encounter for screening for other suspected endocrine disorder: Secondary | ICD-10-CM

## 2016-11-22 MED ORDER — ATORVASTATIN CALCIUM 20 MG PO TABS: 20.0000 mg | ORAL_TABLET | Freq: Every day | ORAL | 1 refills | Status: DC

## 2016-11-22 NOTE — Telephone Encounter (Signed)
Patient called asking about her [employer] forms that she needed filled out. Wanted to speak with you about them and what needed done still.

## 2016-11-22 NOTE — Telephone Encounter (Signed)
-----   Message from Lonn Georgia, PA-C sent at 11/22/2016  4:31 PM EDT ----- Pt of Dr Percival Spanish Please let her know her labs were ok, liver functions were fine. Her bad cholesterol was too high and her good cholesterol was too low. She needs a statin, if she wants to resume Mevacor because she has the tablets ok, if not>start Lipitor 20 mg qd and increase PRN Her K+ was a little low, since she is on Lasix, need to watch that. She can eat K+ rich foods or take 10 meq Kdur daily. Whichever she prefers. Her blood sugar has been up every time it was checked. She needs a HgA1c, by [place] or her PCP, her choice. If he has checked it, fine. Thanks

## 2016-11-22 NOTE — Telephone Encounter (Signed)
Pt aware of HgaA1C recommendation. Order created and I am mailing this to her at her request. She's aware of instructions (she does not need to fast for test).

## 2016-11-22 NOTE — Telephone Encounter (Signed)
Spoke with patient and let her know that we got the forms faxed back that was needed. I am waiting for the doctor to fill out the form and I will fax it back. I told her that Dr. Juleen China should have it finished by the end of the day to fax back to [employer].

## 2016-11-22 NOTE — Progress Notes (Signed)
Cardiac Individual Treatment Plan  Patient Details  Name: Alisha [name] MRN: [id number] Date of Birth: 09/14/1950 Referring Provider:     CARDIAC REHAB PHASE II ORIENTATION from 11/02/2016 in [facility]  Referring Provider  Marijo File MD      Initial Encounter Date:    CARDIAC REHAB PHASE II ORIENTATION from 11/02/2016 in [facility]  Date  11/02/16  Referring Provider  Marijo File MD      Visit Diagnosis: Status post coronary artery stent placement  NSTEMI (non-ST elevated myocardial infarction) ([place])  Patient's Home Medications on Admission:  Current Outpatient Prescriptions:  .  albuterol (PROVENTIL HFA;VENTOLIN HFA) 108 (90 Base) MCG/ACT inhaler, Inhale 2 puffs into the lungs every 6 (six) hours as needed for wheezing or shortness of breath., Disp: 1 Inhaler, Rfl: 6 .  albuterol (PROVENTIL) (2.5 MG/3ML) 0.083% nebulizer solution, Take 3 mLs (2.5 mg total) by nebulization every 6 (six) hours as needed for wheezing or shortness of breath., Disp: 150 mL, Rfl: 1 .  amLODipine (NORVASC) 10 MG tablet, Take 1 tablet (10 mg total) by mouth daily., Disp: 90 tablet, Rfl: 1 .  aspirin EC 81 MG tablet, Take 1 tablet (81 mg total) by mouth daily., Disp: 90 tablet, Rfl: 1 .  budesonide-formoterol (SYMBICORT) 160-4.5 MCG/ACT inhaler, Inhale 2 puffs into the lungs 2 (two) times daily., Disp: 1 Inhaler, Rfl: 6 .  furosemide (LASIX) 20 MG tablet, Take 2 tablets (40 mg total) by mouth daily. (Patient taking differently: Take 20 mg by mouth 2 (two) times daily. ), Disp: 60 tablet, Rfl: 3 .  gabapentin (NEURONTIN) 600 MG tablet, Take 1 tablet (600 mg total) by mouth 3 (three) times daily., Disp: 90 tablet, Rfl: 3 .  isosorbide mononitrate (IMDUR) 30 MG 24 hr tablet, Take 1 tablet (30 mg total) by mouth daily., Disp: 90 tablet, Rfl: 3 .  leflunomide (ARAVA) 20 MG tablet, Take 1 tablet (20 mg total) by mouth daily., Disp: 30 tablet,  Rfl: 0 .  lidocaine (LIDODERM) 5 %, Place 1 patch onto the skin daily. Remove & Discard patch within 12 hours or as directed by MD (Patient not taking: Reported on 11/20/2016), Disp: 30 patch, Rfl: 0 .  lisinopril (PRINIVIL,ZESTRIL) 40 MG tablet, Take 1 tablet (40 mg total) by mouth daily., Disp: 90 tablet, Rfl: 1 .  loratadine (CLARITIN) 10 MG tablet, Take 1 tablet (10 mg total) by mouth daily., Disp: 30 tablet, Rfl: 6 .  metoprolol succinate (TOPROL-XL) 50 MG 24 hr tablet, Take 1 tablet (50 mg total) by mouth daily. Take with or immediately following a meal., Disp: 90 tablet, Rfl: 1 .  mirabegron ER (MYRBETRIQ) 25 MG TB24 tablet, Take 1 tablet (25 mg total) by mouth daily., Disp: 30 tablet, Rfl: 2 .  morphine (MS CONTIN) 15 MG 12 hr tablet, Take 1 tablet (15 mg total) by mouth 3 (three) times daily., Disp: 90 tablet, Rfl: 0 .  nitroGLYCERIN (NITROSTAT) 0.4 MG SL tablet, Place 1 tablet (0.4 mg total) under the tongue every 5 (five) minutes as needed for chest pain., Disp: 25 tablet, Rfl: 3 .  oxyCODONE-acetaminophen (ROXICET) 5-325 MG tablet, Take 1 tablet by mouth every 8 (eight) hours as needed for severe pain., Disp: 90 tablet, Rfl: 0 .  rizatriptan (MAXALT) 10 MG tablet, Take 1 tablet for headache.  May repeat once in 2 hours if needed.  Do not exceed 2 tablets in 24 hours., Disp: 10 tablet, Rfl: 3 .  sertraline (ZOLOFT) 25 MG tablet, Take 1 tablet (25 mg total) by mouth daily., Disp: 30 tablet, Rfl: 2 .  ticagrelor (BRILINTA) 90 MG TABS tablet, Take 1 tablet (90 mg total) by mouth 2 (two) times daily., Disp: 180 tablet, Rfl: 1  Past Medical History: Past Medical History:  Diagnosis Date  . Arthritis   . Asthma   . CAD (coronary artery disease) 07/07/2016   a. s/p stenting x 2 in [place] to the LAD, RCA is chronically occluded, LVEDP was 34  . Depression   . Fibromyalgia   . GERD (gastroesophageal reflux disease)   . History of benign pituitary tumor   . Hyperlipidemia   . Hypertension   .  Migraine   . Morbid obesity ([place])   . Vertigo     Tobacco Use: History  Smoking Status  . Never Smoker  Smokeless Tobacco  . Never Used    Labs: Recent Review Flowsheet Data    Labs for ITP Cardiac and Pulmonary Rehab Latest Ref Rng & Units 11/15/2016   Cholestrol <200 mg/dL 172   LDLCALC <100 mg/dL 105(H)   HDL >50 mg/dL 37(L)   Trlycerides <150 mg/dL 149      Capillary Blood Glucose: No results found for: GLUCAP   Exercise Target Goals:    Exercise Program Goal: Individual exercise prescription set with THRR, safety & activity barriers. Participant demonstrates ability to understand and report RPE using BORG scale, to self-measure pulse accurately, and to acknowledge the importance of the exercise prescription.  Exercise Prescription Goal: Starting with aerobic activity 30 plus minutes a day, 3 days per week for initial exercise prescription. Provide home exercise prescription and guidelines that participant acknowledges understanding prior to discharge.  Activity Barriers & Risk Stratification:     Activity Barriers & Cardiac Risk Stratification - 11/02/16 1146      Activity Barriers & Cardiac Risk Stratification   Activity Barriers Left Knee Replacement;Right Knee Replacement;Neck/Spine Problems;Back Problems;Arthritis;Joint Problems;Deconditioning;Muscular Weakness;Shortness of Breath;Balance Concerns;History of Falls;Assistive Device;Other (comment)   Comments B ankle fusion, L wrist surgery, B carpal tunnel surgery, L wrist cyst removal   Cardiac Risk Stratification High      6 Minute Walk:     6 Minute Walk    Row Name 11/02/16 1135         6 Minute Walk   Phase Initial     Distance 0.45 feet  step test  .45km     Walk Time 6 minutes     # of Rest Breaks 0     MPH 0.27     METS 2     RPE 12     VO2 Peak 7     Symptoms No     Resting HR 62 bpm     Resting BP 110/72     Max Ex. HR 90 bpm     Max Ex. BP 126/70     2 Minute Post BP 110/74         Oxygen Initial Assessment:   Oxygen Re-Evaluation:   Oxygen Discharge (Final Oxygen Re-Evaluation):   Initial Exercise Prescription:     Initial Exercise Prescription - 11/02/16 1100      Date of Initial Exercise RX and Referring Provider   Date 11/02/16   Referring Provider Marijo File MD     NuStep   Level 2   SPM 90   Minutes 20   METs 2     Arm Ergometer   Level 1   Minutes  10   METs 1.3     Prescription Details   Frequency (times per week) 3   Duration Progress to 30 minutes of continuous aerobic without signs/symptoms of physical distress     Intensity   THRR 40-80% of Max Heartrate 61-122   Ratings of Perceived Exertion 11-13   Perceived Dyspnea 0-4     Progression   Progression Continue to progress workloads to maintain intensity without signs/symptoms of physical distress.     Resistance Training   Training Prescription Yes   Weight 1lb   Reps 10-15      Perform Capillary Blood Glucose checks as needed.  Exercise Prescription Changes:   Exercise Comments:   Exercise Goals and Review:     Exercise Goals    Row Name 11/02/16 1148             Exercise Goals   Increase Physical Activity Yes       Intervention Provide advice, education, support and counseling about physical activity/exercise needs.;Develop an individualized exercise prescription for aerobic and resistive training based on initial evaluation findings, risk stratification, comorbidities and participant's personal goals.       Expected Outcomes Achievement of increased cardiorespiratory fitness and enhanced flexibility, muscular endurance and strength shown through measurements of functional capacity and personal statement of participant.       Increase Strength and Stamina Yes       Intervention Develop an individualized exercise prescription for aerobic and resistive training based on initial evaluation findings, risk stratification, comorbidities and participant's  personal goals.;Provide advice, education, support and counseling about physical activity/exercise needs.       Expected Outcomes Achievement of increased cardiorespiratory fitness and enhanced flexibility, muscular endurance and strength shown through measurements of functional capacity and personal statement of participant.          Exercise Goals Re-Evaluation :    Discharge Exercise Prescription (Final Exercise Prescription Changes):   Nutrition:  Target Goals: Understanding of nutrition guidelines, daily intake of sodium 1500mg , cholesterol 200mg , calories 30% from fat and 7% or less from saturated fats, daily to have 5 or more servings of fruits and vegetables.  Biometrics:     Pre Biometrics - 11/02/16 1148      Pre Biometrics   Waist Circumference 52 inches   Hip Circumference 58 inches   Waist to Hip Ratio 0.9 %   Triceps Skinfold 42 mm   % Body Fat 58.1 %   Grip Strength 36 kg   Flexibility 0 in   Single Leg Stand 0 seconds       Nutrition Therapy Plan and Nutrition Goals:     Nutrition Therapy & Goals - 11/07/16 1514      Nutrition Therapy   Diet Therapeutic Lifestyle Changes     Personal Nutrition Goals   Nutrition Goal 1-2 lb wt loss per week to a wt loss goal of 6-24 lb at graduation from [place], educate and counsel regarding individualized specific dietary modifications aiming towards targeted core components such as weight, hypertension, lipid management, diabetes, heart failure and other comorbidities.   Expected Outcomes Short Term Goal: Understand basic principles of dietary content, such as calories, fat, sodium, cholesterol and nutrients.;Long Term Goal: Adherence to prescribed nutrition plan.      Nutrition Discharge: Nutrition Scores:     Nutrition Assessments - 11/07/16 1513      MEDFICTS Scores   Pre Score 24  Nutrition Goals Re-Evaluation:   Nutrition Goals  Re-Evaluation:   Nutrition Goals Discharge (Final Nutrition Goals Re-Evaluation):   Psychosocial: Target Goals: Acknowledge presence or absence of significant depression and/or stress, maximize coping skills, provide positive support system. Participant is able to verbalize types and ability to use techniques and skills needed for reducing stress and depression.  Initial Review & Psychosocial Screening:     Initial Psych Review & Screening - 11/02/16 1735      Initial Review   Current issues with (P)  Current Anxiety/Panic      Quality of Life Scores:     Quality of Life - 11/02/16 1152      Quality of Life Scores   Health/Function Pre 12.04 %   Socioeconomic Pre 18.42 %   Psych/Spiritual Pre 16.7 %   Family Pre 25.5 %   GLOBAL Pre 16.27 %      PHQ-9: Recent Review Flowsheet Data    Depression screen [facility] 2/9 11/20/2016 09/25/2016   Decreased Interest 1 0   Down, Depressed, Hopeless 3 1   PHQ - 2 Score 4 1   Altered sleeping 3 -   Tired, decreased energy 3 -   Change in appetite 0 -   Feeling bad or failure about yourself  2 -   Trouble concentrating 2 -   Moving slowly or fidgety/restless 0 -   Suicidal thoughts 0 -   PHQ-9 Score 14 -   Difficult doing work/chores Very difficult -     Interpretation of Total Score  Total Score Depression Severity:  1-4 = Minimal depression, 5-9 = Mild depression, 10-14 = Moderate depression, 15-19 = Moderately severe depression, 20-27 = Severe depression   Psychosocial Evaluation and Intervention:   Psychosocial Re-Evaluation:   Psychosocial Discharge (Final Psychosocial Re-Evaluation):   Vocational Rehabilitation: Provide vocational rehab assistance to qualifying candidates.   Vocational Rehab Evaluation & Intervention:   Education: Education Goals: Education classes will be provided on a weekly basis, covering required topics. Participant will state understanding/return demonstration of topics  presented.  Learning Barriers/Preferences:     Learning Barriers/Preferences - 11/02/16 1134      Learning Barriers/Preferences   Learning Barriers Sight   Learning Preferences None      Education Topics: Count Your Pulse:  -Group instruction provided by verbal instruction, demonstration, patient participation and written materials to support subject.  Instructors address importance of being able to find your pulse and how to count your pulse when at home without a heart monitor.  Patients get hands on experience counting their pulse with staff help and individually.   Heart Attack, Angina, and Risk Factor Modification:  -Group instruction provided by verbal instruction, video, and written materials to support subject.  Instructors address signs and symptoms of angina and heart attacks.    Also discuss risk factors for heart disease and how to make changes to improve heart health risk factors.   Functional Fitness:  -Group instruction provided by verbal instruction, demonstration, patient participation, and written materials to support subject.  Instructors address safety measures for doing things around the house.  Discuss how to get up and down off the floor, how to pick things up properly, how to safely get out of a chair without assistance, and balance training.   CARDIAC REHAB PHASE II EXERCISE from 11/10/2016 in [facility]  Date  11/10/16  Educator  [place]  Instruction Review Code  2- meets goals/outcomes      Meditation  and Mindfulness:  -Group instruction provided by verbal instruction, patient participation, and written materials to support subject.  Instructor addresses importance of mindfulness and meditation practice to help reduce stress and improve awareness.  Instructor also leads participants through a meditation exercise.    Stretching for Flexibility and Mobility:  -Group instruction provided by verbal instruction,  patient participation, and written materials to support subject.  Instructors lead participants through series of stretches that are designed to increase flexibility thus improving mobility.  These stretches are additional exercise for major muscle groups that are typically performed during regular warm up and cool down.   Hands Only CPR Anytime:  -Group instruction provided by verbal instruction, video, patient participation and written materials to support subject.  Instructors co-teach with AHA video for hands only CPR.  Participants get hands on experience with mannequins.   Nutrition I class: Heart Healthy Eating:  -Group instruction provided by PowerPoint slides, verbal discussion, and written materials to support subject matter. The instructor gives an explanation and review of the Therapeutic Lifestyle Changes diet recommendations, which includes a discussion on lipid goals, dietary fat, sodium, fiber, plant stanol/sterol esters, sugar, and the components of a well-balanced, healthy diet.   Nutrition II class: Lifestyle Skills:  -Group instruction provided by PowerPoint slides, verbal discussion, and written materials to support subject matter. The instructor gives an explanation and review of label reading, grocery shopping for heart health, heart healthy recipe modifications, and ways to make healthier choices when eating out.   Diabetes Question & Answer:  -Group instruction provided by PowerPoint slides, verbal discussion, and written materials to support subject matter. The instructor gives an explanation and review of diabetes co-morbidities, pre- and post-prandial blood glucose goals, pre-exercise blood glucose goals, signs, symptoms, and treatment of hypoglycemia and hyperglycemia, and foot care basics.   Diabetes Blitz:  -Group instruction provided by PowerPoint slides, verbal discussion, and written materials to support subject matter. The instructor gives an explanation and  review of the physiology behind type 1 and type 2 diabetes, diabetes medications and rational behind using different medications, pre- and post-prandial blood glucose recommendations and Hemoglobin A1c goals, diabetes diet, and exercise including blood glucose guidelines for exercising safely.    Portion Distortion:  -Group instruction provided by PowerPoint slides, verbal discussion, written materials, and food models to support subject matter. The instructor gives an explanation of serving size versus portion size, changes in portions sizes over the last 20 years, and what consists of a serving from each food group.   Stress Management:  -Group instruction provided by verbal instruction, video, and written materials to support subject matter.  Instructors review role of stress in heart disease and how to cope with stress positively.     Exercising on Your Own:  -Group instruction provided by verbal instruction, power point, and written materials to support subject.  Instructors discuss benefits of exercise, components of exercise, frequency and intensity of exercise, and end points for exercise.  Also discuss use of nitroglycerin and activating EMS.  Review options of places to exercise outside of rehab.  Review guidelines for sex with heart disease.   Cardiac Drugs I:  -Group instruction provided by verbal instruction and written materials to support subject.  Instructor reviews cardiac drug classes: antiplatelets, anticoagulants, beta blockers, and statins.  Instructor discusses reasons, side effects, and lifestyle considerations for each drug class.   Cardiac Drugs II:  -Group instruction provided by verbal instruction and written materials to support subject.  Instructor reviews cardiac drug  classes: angiotensin converting enzyme inhibitors (ACE-I), angiotensin II receptor blockers (ARBs), nitrates, and calcium channel blockers.  Instructor discusses reasons, side effects, and lifestyle  considerations for each drug class.   Anatomy and Physiology of the Circulatory System:  -Group instruction provided by verbal instruction, video, and written materials to support subject.  Reviews functional anatomy of heart, how it relates to various diagnoses, and what role the heart plays in the overall system.   Knowledge Questionnaire Score:     Knowledge Questionnaire Score - 11/02/16 1152      Knowledge Questionnaire Score   Pre Score 13/28      Core Components/Risk Factors/Patient Goals at Admission:     Personal Goals and Risk Factors at Admission - 11/02/16 1149      Core Components/Risk Factors/Patient Goals on Admission    Weight Management Yes;Weight Loss;Obesity   Intervention Weight Management: Develop a combined nutrition and exercise program designed to reach desired caloric intake, while maintaining appropriate intake of nutrient and fiber, sodium and fats, and appropriate energy expenditure required for the weight goal.;Weight Management: Provide education and appropriate resources to help participant work on and attain dietary goals.;Weight Management/Obesity: Establish reasonable short term and long term weight goals.;Obesity: Provide education and appropriate resources to help participant work on and attain dietary goals.   Expected Outcomes Short Term: Continue to assess and modify interventions until short term weight is achieved;Long Term: Adherence to nutrition and physical activity/exercise program aimed toward attainment of established weight goal;Weight Maintenance: Understanding of the daily nutrition guidelines, which includes 25-35% calories from fat, 7% or less cal from saturated fats, less than 200mg  cholesterol, less than 1.5gm of sodium, & 5 or more servings of fruits and vegetables daily;Weight Loss: Understanding of general recommendations for a balanced deficit meal plan, which promotes 1-2 lb weight loss per week and includes a negative energy balance  of [id number] kcal/d;Understanding recommendations for meals to include 15-35% energy as protein, 25-35% energy from fat, 35-60% energy from carbohydrates, less than 200mg  of dietary cholesterol, 20-35 gm of total fiber daily;Understanding of distribution of calorie intake throughout the day with the consumption of 4-5 meals/snacks   Improve shortness of breath with ADL's Yes   Intervention Provide education, individualized exercise plan and daily activity instruction to help decrease symptoms of SOB with activities of daily living.   Expected Outcomes Short Term: Achieves a reduction of symptoms when performing activities of daily living.   Hypertension Yes   Intervention Provide education on lifestyle modifcations including regular physical activity/exercise, weight management, moderate sodium restriction and increased consumption of fresh fruit, vegetables, and low fat dairy, alcohol moderation, and smoking cessation.;Monitor prescription use compliance.   Expected Outcomes Short Term: Continued assessment and intervention until BP is < 140/51mm HG in hypertensive participants. < 130/72mm HG in hypertensive participants with diabetes, heart failure or chronic kidney disease.;Long Term: Maintenance of blood pressure at goal levels.   Lipids Yes   Intervention Provide education and support for participant on nutrition & aerobic/resistive exercise along with prescribed medications to achieve LDL 70mg , HDL >40mg .   Expected Outcomes Short Term: Participant states understanding of desired cholesterol values and is compliant with medications prescribed. Participant is following exercise prescription and nutrition guidelines.;Long Term: Cholesterol controlled with medications as prescribed, with individualized exercise RX and with personalized nutrition plan. Value goals: LDL < 70mg , HDL > 40 mg.   Stress Yes   Intervention Offer individual and/or small group education and counseling on adjustment to heart  disease, stress management  and health-related lifestyle change. Teach and support self-help strategies.;Refer participants experiencing significant psychosocial distress to appropriate mental health specialists for further evaluation and treatment. When possible, include family members and significant others in education/counseling sessions.   Expected Outcomes Short Term: Participant demonstrates changes in health-related behavior, relaxation and other stress management skills, ability to obtain effective social support, and compliance with psychotropic medications if prescribed.;Long Term: Emotional wellbeing is indicated by absence of clinically significant psychosocial distress or social isolation.   Personal Goal Other Yes   Personal Goal get back to water aerobics at [facility]   Intervention Provide exercise programming to assist with building exercise tolerance and aerobic capacity.   Expected Outcomes Pt will build on aerobic capacity and get back to water aerobics.      Core Components/Risk Factors/Patient Goals Review:    Core Components/Risk Factors/Patient Goals at Discharge (Final Review):    ITP Comments:     ITP Comments    Row Name 11/02/16 1127           ITP Comments Dr. Fransico Him, Medical Director          Comments: pt exercise has been on hold for back pain evaluation.   Pt plans to begin cardiac rehab group exercise sessions 11/24/2016.

## 2016-11-22 NOTE — Telephone Encounter (Signed)
Spoke to patient, advice relayed, pt agreeable to initiation of lipitor. Diet recommendations acknowledged. Aware to call if new concerns. Rx sent to pharmacy

## 2016-11-24 ENCOUNTER — Inpatient Hospital Stay (HOSPITAL_COMMUNITY)
Admission: RE | Admit: 2016-11-24 | Discharge: 2016-11-24 | Disposition: A | Discharge status: Home / Self Care | Payer: Medicare Other | Source: Ambulatory Visit

## 2016-11-24 DIAGNOSIS — I214 Non-ST elevation (NSTEMI) myocardial infarction: Secondary | ICD-10-CM

## 2016-11-24 DIAGNOSIS — Z955 Presence of coronary angioplasty implant and graft: Secondary | ICD-10-CM

## 2016-11-25 DIAGNOSIS — Z79899 Other long term (current) drug therapy: Secondary | ICD-10-CM | POA: Insufficient documentation

## 2016-11-25 NOTE — Progress Notes (Signed)
  UPDATED Current Outpatient Prescriptions:  .  albuterol (PROVENTIL HFA;VENTOLIN HFA) 108 (90 Base) MCG/ACT inhaler, Inhale 2 puffs into the lungs every 6 (six) hours as needed for wheezing or shortness of breath., Disp: 1 Inhaler, Rfl: 6 .  albuterol (PROVENTIL) (2.5 MG/3ML) 0.083% nebulizer solution, Take 3 mLs (2.5 mg total) by nebulization every 6 (six) hours as needed for wheezing or shortness of breath., Disp: 150 mL, Rfl: 1 .  amLODipine (NORVASC) 10 MG tablet, Take 1 tablet (10 mg total) by mouth daily., Disp: 90 tablet, Rfl: 1 .  aspirin EC 81 MG tablet, Take 1 tablet (81 mg total) by mouth daily., Disp: 90 tablet, Rfl: 1 .  budesonide-formoterol (SYMBICORT) 160-4.5 MCG/ACT inhaler, Inhale 2 puffs into the lungs 2 (two) times daily., Disp: 1 Inhaler, Rfl: 6 .  furosemide (LASIX) 20 MG tablet, Take 2 tablets (40 mg total) by mouth daily. (Patient taking differently: Take 20 mg by mouth 2 (two) times daily. ), Disp: 60 tablet, Rfl: 3 .  gabapentin (NEURONTIN) 600 MG tablet, Take 1 tablet (600 mg total) by mouth 3 (three) times daily., Disp: 90 tablet, Rfl: 3 .  isosorbide mononitrate (IMDUR) 30 MG 24 hr tablet, Take 1 tablet (30 mg total) by mouth daily., Disp: 90 tablet, Rfl: 3 .  leflunomide (ARAVA) 20 MG tablet, Take 1 tablet (20 mg total) by mouth daily., Disp: 30 tablet, Rfl: 0 .  lisinopril (PRINIVIL,ZESTRIL) 40 MG tablet, Take 1 tablet (40 mg total) by mouth daily., Disp: 90 tablet, Rfl: 1 .  loratadine (CLARITIN) 10 MG tablet, Take 1 tablet (10 mg total) by mouth daily., Disp: 30 tablet, Rfl: 6 .  metoprolol succinate (TOPROL-XL) 50 MG 24 hr tablet, Take 1 tablet (50 mg total) by mouth daily. Take with or immediately following a meal., Disp: 90 tablet, Rfl: 1 .  mirabegron ER (MYRBETRIQ) 25 MG TB24 tablet, Take 1 tablet (25 mg total) by mouth daily., Disp: 30 tablet, Rfl: 2 .  morphine (MS CONTIN) 15 MG 12 hr tablet, Take 1 tablet (15 mg total) by mouth 3 (three) times daily., Disp: 90  tablet, Rfl: 0 .  nitroGLYCERIN (NITROSTAT) 0.4 MG SL tablet, Place 1 tablet (0.4 mg total) under the tongue every 5 (five) minutes as needed for chest pain., Disp: 25 tablet, Rfl: 3 .  oxyCODONE-acetaminophen (ROXICET) 5-325 MG tablet, Take 1 tablet by mouth every 8 (eight) hours as needed for severe pain., Disp: 90 tablet, Rfl: 0 .  rizatriptan (MAXALT) 10 MG tablet, Take 1 tablet for headache.  May repeat once in 2 hours if needed.  Do not exceed 2 tablets in 24 hours., Disp: 10 tablet, Rfl: 3 .  sertraline (ZOLOFT) 25 MG tablet, Take 1 tablet (25 mg total) by mouth daily., Disp: 30 tablet, Rfl: 2 .  ticagrelor (BRILINTA) 90 MG TABS tablet, Take 1 tablet (90 mg total) by mouth 2 (two) times daily., Disp: 180 tablet, Rfl: 1 .  atorvastatin (LIPITOR) 20 MG tablet, Take 1 tablet (20 mg total) by mouth daily., Disp: 90 tablet, Rfl: 1

## 2016-11-27 ENCOUNTER — Ambulatory Visit (HOSPITAL_COMMUNITY): Payer: Medicare Other

## 2016-11-28 NOTE — Progress Notes (Signed)
Office Visit Note   Patient: Alisha [name]           Date of Birth: 09/30/1950           MRN: [id number] Visit Date: 11/15/2016              Requested by: Briscoe Deutscher, [address] [place], [place] [address] PCP: Briscoe Deutscher, DO  Chief Complaint  Patient presents with  . Left Foot - Pain      HPI: The patient is a 66 year old woman who presents today for evaluation of left foot pain. This is chronic. Complaining of pain pointing to the base of her fifth metatarsal on the left. States she moved to the area in December from [place] and has had pain with ambulation since. Today is using a motorized wheelchair for mobility. States that she previous to December was able to use the chair intermittently and now depends upon it. Does endorse a history of foot surgeries in the 1990s. Complaining of plantar pain as well as lateral foot pain.  No known injury.  Does endorse sharp shooting pain and bilateral feet.  Assessment & Plan: Visit Diagnoses:  1. Pain in left ankle and joints of left foot     Plan: We will place her in a fracture boot on the left. Follow-up in office in 3 weeks  Follow-Up Instructions: Return in about 3 weeks (around 12/06/2016).   Ortho Exam  Patient is alert, oriented, no adenopathy, well-dressed, normal affect, normal respiratory effort. Left foot: There is moderate swelling to the foot. Does have point tenderness along the base of the fifth metatarsal. There is no skin breakdown no ecchymosis. Does have a dorsalis pedis pulse. Ankle ligaments are nontender. Plantar fascia nontender. Imaging: No results found.  Labs: No results found for: HGBA1C, ESRSEDRATE, CRP, LABURIC, REPTSTATUS, GRAMSTAIN, CULT, LABORGA  Orders:  Orders Placed This Encounter  Procedures  . XR Foot Complete Left   No orders of the defined types were placed in this encounter.    Procedures: No procedures performed  Clinical Data: No additional  findings.  ROS:  Review of Systems  Constitutional: Negative for chills and fever.  Cardiovascular: Negative for leg swelling.  Musculoskeletal: Positive for arthralgias and gait problem.  Neurological: Negative for weakness and numbness.    Objective: Vital Signs: There were no vitals taken for this visit.  Specialty Comments:  No specialty comments available.  PMFS History: Patient Active Problem List   Diagnosis Date Noted  . Polypharmacy 11/25/2016  . Venous stasis of both lower extremities 10/04/2016  . Poor dentition 10/04/2016  . OSA (obstructive sleep apnea) 10/04/2016  . History of nephrolithiasis 10/04/2016  . Hx of pilonidal cyst 10/04/2016  . Bipolar depression ([place]) 10/04/2016  . Personal history of DVT (deep vein thrombosis) 10/04/2016  . Morbid obesity ([place]) 09/25/2016  . 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 Essential hypertension 09/25/2016  . Arthritis with psoriasis ([place]) 09/25/2016  . Vision impairment 09/25/2016  . Dysthymia 09/25/2016  . Chronic seasonal allergic rhinitis due to pollen 09/25/2016  . Moderate persistent asthma without complication 27/78/2423  . Gastroesophageal reflux disease without esophagitis 09/25/2016  . OAB (overactive bladder) 09/25/2016  . Chronic pain syndrome 09/25/2016   Past Medical History:  Diagnosis Date  . Arthritis   . Asthma   . CAD (coronary artery disease) 07/07/2016   a. s/p stenting x 2 in [place] to the LAD, RCA is chronically occluded, LVEDP  was 32  . Depression   . Fibromyalgia   . GERD (gastroesophageal reflux disease)   . History of benign pituitary tumor   . Hyperlipidemia   . Hypertension   . Migraine   . Morbid obesity ([place])   . Vertigo     Family History  Problem Relation Age of Onset  . Heart disease Mother 10    CABG, pacemaker, valve  . Cancer Father   . Heart disease Sister     Fluid around the heart    Past Surgical History:  Procedure  Laterality Date  . carpel tunnel release Bilateral 2017  . EXCISION / CURETTAGE BONE CYST PHALANGES OF FOOT  2014   Removal of foot cyst   . PARTIAL HYSTERECTOMY  unknown   Patient still has ovaries  . pituitary tumor removal  1999  . TONSILLECTOMY    . TOTAL KNEE ARTHROPLASTY     TKR X 3   Social History   Occupational History  . Not on file.   Social History Main Topics  . Smoking status: Never Smoker  . Smokeless tobacco: Never Used  . Alcohol use Yes     Comment: Socially. [name].  . Drug use: No  . Sexual activity: No

## 2016-11-29 ENCOUNTER — Telehealth (HOSPITAL_COMMUNITY): Payer: Self-pay | Admitting: Cardiac Rehabilitation

## 2016-11-29 ENCOUNTER — Telehealth: Payer: Self-pay | Admitting: *Deleted

## 2016-11-29 ENCOUNTER — Ambulatory Visit (INDEPENDENT_AMBULATORY_CARE_PROVIDER_SITE_OTHER): Payer: Medicare Other | Admitting: Orthopedic Surgery

## 2016-11-29 ENCOUNTER — Ambulatory Visit (HOSPITAL_COMMUNITY): Payer: Medicare Other

## 2016-11-29 NOTE — Telephone Encounter (Signed)
pc to pt to assess reason for continued absence from cardiac rehab. Pt states she is awaiting SCAT authorization and interview.  Pt is aware she is cleared to return to cardiac rehab.  Pt will call once SCAT arrangements are finalized.

## 2016-11-29 NOTE — Telephone Encounter (Signed)
Called Alisha [name] in an attempt to discuss her [facility] referral. She stated she was [place] bus stop but would call back when she could.

## 2016-11-30 ENCOUNTER — Inpatient Hospital Stay: Admission: RE | Admit: 2016-11-30 | Payer: Medicare Other | Source: Ambulatory Visit

## 2016-12-01 ENCOUNTER — Ambulatory Visit (HOSPITAL_COMMUNITY): Payer: Medicare Other

## 2016-12-04 ENCOUNTER — Ambulatory Visit (HOSPITAL_COMMUNITY): Payer: Medicare Other

## 2016-12-06 ENCOUNTER — Ambulatory Visit (HOSPITAL_COMMUNITY): Payer: Medicare Other

## 2016-12-08 ENCOUNTER — Inpatient Hospital Stay (HOSPITAL_COMMUNITY): Admission: RE | Admit: 2016-12-08 | Payer: Medicare Other | Source: Ambulatory Visit

## 2016-12-08 ENCOUNTER — Telehealth (HOSPITAL_COMMUNITY): Payer: Self-pay | Admitting: Cardiac Rehabilitation

## 2016-12-08 NOTE — Telephone Encounter (Signed)
pc to pt to assess transportation arrangements to be able to begin cardiac rehab. Pt reports she has SCAT interview appt 12/12/2016.  She will call to r/s orientation after transportation finalized.

## 2016-12-11 ENCOUNTER — Ambulatory Visit (HOSPITAL_COMMUNITY): Payer: Medicare Other

## 2016-12-13 ENCOUNTER — Ambulatory Visit (INDEPENDENT_AMBULATORY_CARE_PROVIDER_SITE_OTHER): Payer: Medicare Other | Admitting: Family Medicine

## 2016-12-13 ENCOUNTER — Ambulatory Visit (HOSPITAL_COMMUNITY): Payer: Medicare Other

## 2016-12-13 ENCOUNTER — Emergency Department (HOSPITAL_COMMUNITY): Payer: Medicare Other

## 2016-12-13 ENCOUNTER — Ambulatory Visit (INDEPENDENT_AMBULATORY_CARE_PROVIDER_SITE_OTHER): Payer: Medicare Other

## 2016-12-13 ENCOUNTER — Encounter: Payer: Self-pay | Admitting: Family Medicine

## 2016-12-13 ENCOUNTER — Emergency Department (HOSPITAL_COMMUNITY)
Admission: EM | Admit: 2016-12-13 | Discharge: 2016-12-13 | Disposition: A | Discharge status: Home / Self Care | Payer: Medicare Other | Attending: Emergency Medicine | Admitting: Emergency Medicine

## 2016-12-13 VITALS — BP 124/84 | HR 64 | Temp 98.1°F

## 2016-12-13 DIAGNOSIS — G8929 Other chronic pain: Secondary | ICD-10-CM | POA: Insufficient documentation

## 2016-12-13 DIAGNOSIS — R0781 Pleurodynia: Secondary | ICD-10-CM

## 2016-12-13 DIAGNOSIS — R1313 Dysphagia, pharyngeal phase: Secondary | ICD-10-CM | POA: Diagnosis not present

## 2016-12-13 DIAGNOSIS — Y939 Activity, unspecified: Secondary | ICD-10-CM | POA: Insufficient documentation

## 2016-12-13 DIAGNOSIS — Z79899 Other long term (current) drug therapy: Secondary | ICD-10-CM | POA: Insufficient documentation

## 2016-12-13 DIAGNOSIS — R0682 Tachypnea, not elsewhere classified: Secondary | ICD-10-CM | POA: Diagnosis not present

## 2016-12-13 DIAGNOSIS — M545 Low back pain: Secondary | ICD-10-CM | POA: Diagnosis not present

## 2016-12-13 DIAGNOSIS — J45909 Unspecified asthma, uncomplicated: Secondary | ICD-10-CM | POA: Insufficient documentation

## 2016-12-13 DIAGNOSIS — R531 Weakness: Secondary | ICD-10-CM | POA: Diagnosis not present

## 2016-12-13 DIAGNOSIS — Y929 Unspecified place or not applicable: Secondary | ICD-10-CM | POA: Diagnosis not present

## 2016-12-13 DIAGNOSIS — S3992XA Unspecified injury of lower back, initial encounter: Secondary | ICD-10-CM | POA: Diagnosis not present

## 2016-12-13 DIAGNOSIS — I25119 Atherosclerotic heart disease of native coronary artery with unspecified angina pectoris: Secondary | ICD-10-CM

## 2016-12-13 DIAGNOSIS — M542 Cervicalgia: Secondary | ICD-10-CM | POA: Diagnosis not present

## 2016-12-13 DIAGNOSIS — R0602 Shortness of breath: Secondary | ICD-10-CM

## 2016-12-13 DIAGNOSIS — Z96659 Presence of unspecified artificial knee joint: Secondary | ICD-10-CM | POA: Diagnosis not present

## 2016-12-13 DIAGNOSIS — R1310 Dysphagia, unspecified: Secondary | ICD-10-CM

## 2016-12-13 DIAGNOSIS — I10 Essential (primary) hypertension: Secondary | ICD-10-CM | POA: Diagnosis not present

## 2016-12-13 DIAGNOSIS — I2510 Atherosclerotic heart disease of native coronary artery without angina pectoris: Secondary | ICD-10-CM | POA: Insufficient documentation

## 2016-12-13 DIAGNOSIS — Z7982 Long term (current) use of aspirin: Secondary | ICD-10-CM | POA: Diagnosis not present

## 2016-12-13 DIAGNOSIS — Y999 Unspecified external cause status: Secondary | ICD-10-CM | POA: Insufficient documentation

## 2016-12-13 DIAGNOSIS — W1830XA Fall on same level, unspecified, initial encounter: Secondary | ICD-10-CM | POA: Insufficient documentation

## 2016-12-13 DIAGNOSIS — S299XXA Unspecified injury of thorax, initial encounter: Secondary | ICD-10-CM | POA: Diagnosis not present

## 2016-12-13 DIAGNOSIS — W19XXXA Unspecified fall, initial encounter: Secondary | ICD-10-CM

## 2016-12-13 LAB — CBC WITH DIFFERENTIAL/PLATELET
BASOS PCT: 0 %
Basophils Absolute: 0 10*3/uL (ref 0.0–0.1)
Eosinophils Absolute: 0.2 10*3/uL (ref 0.0–0.7)
Eosinophils Relative: 3 %
HCT: 36.9 % (ref 36.0–46.0)
HEMOGLOBIN: 11.9 g/dL — AB (ref 12.0–15.0)
Lymphocytes Relative: 40 %
Lymphs Abs: 2.7 10*3/uL (ref 0.7–4.0)
MCH: 27.7 pg (ref 26.0–34.0)
MCHC: 32.2 g/dL (ref 30.0–36.0)
MCV: 85.8 fL (ref 78.0–100.0)
MONOS PCT: 8 %
Monocytes Absolute: 0.5 10*3/uL (ref 0.1–1.0)
NEUTROS ABS: 3.3 10*3/uL (ref 1.7–7.7)
NEUTROS PCT: 49 %
Platelets: 191 10*3/uL (ref 150–400)
RBC: 4.3 MIL/uL (ref 3.87–5.11)
RDW: 14.6 % (ref 11.5–15.5)
WBC: 6.7 10*3/uL (ref 4.0–10.5)

## 2016-12-13 LAB — POCT URINALYSIS DIPSTICK
Bilirubin, UA: NEGATIVE
Blood, UA: NEGATIVE
Glucose, UA: NEGATIVE
Ketones, UA: NEGATIVE
Leukocytes, UA: NEGATIVE
Nitrite, UA: NEGATIVE
Protein, UA: 15
Spec Grav, UA: >=1.03 — AB (ref 1.010–1.025)
Urobilinogen, UA: 0.2 E.U./dL
pH, UA: 6 (ref 5.0–8.0)

## 2016-12-13 LAB — COMPREHENSIVE METABOLIC PANEL
ALK PHOS: 100 U/L (ref 38–126)
ALT: 18 U/L (ref 14–54)
ANION GAP: 10 (ref 5–15)
AST: 20 U/L (ref 15–41)
Albumin: 3.5 g/dL (ref 3.5–5.0)
BUN: 7 mg/dL (ref 6–20)
CALCIUM: 9.5 mg/dL (ref 8.9–10.3)
CHLORIDE: 106 mmol/L (ref 101–111)
CO2: 28 mmol/L (ref 22–32)
Creatinine, Ser: 0.78 mg/dL (ref 0.44–1.00)
GFR calc non Af Amer: >60 mL/min (ref >60)
Glucose, Bld: 89 mg/dL (ref 65–99)
Potassium: 3.7 mmol/L (ref 3.5–5.1)
SODIUM: 144 mmol/L (ref 135–145)
Total Bilirubin: 0.7 mg/dL (ref 0.3–1.2)
Total Protein: 6.5 g/dL (ref 6.5–8.1)

## 2016-12-13 LAB — URINALYSIS, ROUTINE W REFLEX MICROSCOPIC
Bilirubin Urine: NEGATIVE
GLUCOSE, UA: NEGATIVE mg/dL
Hgb urine dipstick: NEGATIVE
Ketones, ur: NEGATIVE mg/dL
LEUKOCYTES UA: NEGATIVE
Nitrite: NEGATIVE
PROTEIN: NEGATIVE mg/dL
Specific Gravity, Urine: 1.018 (ref 1.005–1.030)
pH: 6 (ref 5.0–8.0)

## 2016-12-13 LAB — LIPASE, BLOOD: Lipase: 15 U/L (ref 11–51)

## 2016-12-13 MED ORDER — MORPHINE SULFATE (PF) 4 MG/ML IV SOLN
4.0000 mg | Freq: Once | INTRAVENOUS | Status: AC
Start: 2016-12-13 — End: 2016-12-13
  Administered 2016-12-13: 4 mg via INTRAVENOUS
  Filled 2016-12-13: qty 1

## 2016-12-13 MED ORDER — ESOMEPRAZOLE MAGNESIUM 40 MG PO CPDR: 40.0000 mg | DELAYED_RELEASE_CAPSULE | Freq: Every day | ORAL | 3 refills | Status: DC

## 2016-12-13 MED ORDER — IOPAMIDOL (ISOVUE-300) INJECTION 61%
INTRAVENOUS | Status: AC
  Administered 2016-12-13: 75 mL
  Filled 2016-12-13: qty 75

## 2016-12-13 MED ORDER — FENTANYL CITRATE (PF) 100 MCG/2ML IJ SOLN
50.0000 ug | Freq: Once | INTRAMUSCULAR | Status: AC
  Administered 2016-12-13: 50 ug via INTRAVENOUS
  Filled 2016-12-13: qty 2

## 2016-12-13 MED ORDER — METHYLPREDNISOLONE 4 MG PO TBPK: ORAL_TABLET | ORAL | 0 refills | Status: DC

## 2016-12-13 MED ORDER — FUROSEMIDE 10 MG/ML IJ SOLN
40.0000 mg | INTRAMUSCULAR | Status: AC
  Administered 2016-12-13: 40 mg via INTRAVENOUS
  Filled 2016-12-13: qty 4

## 2016-12-13 NOTE — Discharge Instructions (Signed)
Your testing shows that you do have some swelling of the soft tissues in the back appears throat. This does appear chronic, there is no tumors that are seen, you should take the steroid as prescribed over the next 6 days, as will also help with her back pain. Please have your family doctor evaluate you for the back pain. They will likely need to refer you to a spinal doctor or an orthopedic doctor. Return to the emergency department for severe or worsening pain, difficulty breathing, persistent vomiting, numbness or weakness of the legs.

## 2016-12-13 NOTE — ED Notes (Signed)
Patient transported to MRI 

## 2016-12-13 NOTE — ED Notes (Signed)
Pt assisted to bedside commode. Spo2 decreased to 80% on ra with exertion. PT returned to bed and placed on 2lpm prn and spo2 now 100%  Pt reports feeling mildly sob

## 2016-12-13 NOTE — ED Notes (Signed)
Bladder scanner shows max of 61ml of urine

## 2016-12-13 NOTE — Progress Notes (Signed)
Pre visit review using our clinic review tool, if applicable. No additional management support is needed unless otherwise documented below in the visit note. 

## 2016-12-13 NOTE — Progress Notes (Signed)
Alisha [name] is a 66 y.o. female is here to discuss:  History of Present Illness:   Water quality scientist, CMA, acting as scribe for Dr. Juleen China.  HPI:  1. Low back pain. Chronic, but worse. Fall last week, tripped. Pain is bilateral low back with radiation to front. None down legs. No paresthesias or new/worsening incontinence. Usual pain management not helping. The patient admits that sitting in her power chair all day is likely making her pain worse. She is bored and lonely at home.    2. Rib pain on right side. After fall. Under right breast. No bruising, SOB, wheeze, or cough.   4. Dysphagia, unspecified type. Endorses choking on some foods lately. No aspiration symptoms.     Health Maintenance Due  Topic Date Due  . Hepatitis C Screening  09-20-1950  . MAMMOGRAM  05/23/1999  . COLONOSCOPY  05/23/1999  . DEXA SCAN  05/22/2014  . PNA vac Low Risk Adult (1 of 2 - PCV13) 05/22/2014   PMHx, SurgHx, SocialHx, FamHx, Medications, and Allergies were reviewed in the Visit Navigator and updated as appropriate.   Patient Active Problem List   Diagnosis Date Noted  . Polypharmacy 11/25/2016  . Venous stasis of both lower extremities 10/04/2016  . Poor dentition 10/04/2016  . OSA (obstructive sleep apnea) 10/04/2016  . History of nephrolithiasis 10/04/2016  . Hx of pilonidal cyst 10/04/2016  . Bipolar depression ([place]) 10/04/2016  . Personal history of DVT (deep vein thrombosis) 10/04/2016  . Morbid obesity ([place]) 09/25/2016  . 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 Essential hypertension 09/25/2016  . Arthritis with psoriasis ([place]) 09/25/2016  . Vision impairment 09/25/2016  . Dysthymia 09/25/2016  . Chronic seasonal allergic rhinitis due to pollen 09/25/2016  . Moderate persistent asthma without complication 24/04/7352  . Gastroesophageal reflux disease without esophagitis 09/25/2016  . OAB (overactive bladder) 09/25/2016  . Chronic pain  syndrome 09/25/2016   Social History  Substance Use Topics  . Smoking status: Never Smoker  . Smokeless tobacco: Never Used  . Alcohol use Yes     Comment: Socially. Alisha [name].   Current Medications and Allergies:   .  albuterol (PROVENTIL HFA;VENTOLIN HFA) 108 (90 Base) MCG/ACT inhaler, Inhale 2 puffs into the lungs every 6 (six) hours as needed for wheezing or shortness of breath., Disp: 1 Inhaler, Rfl: 6 .  albuterol (PROVENTIL) (2.5 MG/3ML) 0.083% nebulizer solution, Take 3 mLs (2.5 mg total) by nebulization every 6 (six) hours as needed for wheezing or shortness of breath., Disp: 150 mL, Rfl: 1 .  amLODipine (NORVASC) 10 MG tablet, Take 1 tablet (10 mg total) by mouth daily., Disp: 90 tablet, Rfl: 1 .  aspirin EC 81 MG tablet, Take 1 tablet (81 mg total) by mouth daily., Disp: 90 tablet, Rfl: 1 .  atorvastatin (LIPITOR) 20 MG tablet, Take 1 tablet (20 mg total) by mouth daily., Disp: 90 tablet, Rfl: 1 .  budesonide-formoterol (SYMBICORT) 160-4.5 MCG/ACT inhaler, Inhale 2 puffs into the lungs 2 (two) times daily., Disp: 1 Inhaler, Rfl: 6 .  furosemide (LASIX) 20 MG tablet, Take 2 tablets (40 mg total) by mouth daily. (Patient taking differently: Take 20 mg by mouth 2 (two) times daily. ), Disp: 60 tablet, Rfl: 3 .  gabapentin (NEURONTIN) 600 MG tablet, Take 1 tablet (600 mg total) by mouth 3 (three) times daily., Disp: 90 tablet, Rfl: 3 .  isosorbide mononitrate (IMDUR) 30 MG 24 hr tablet, Take 1 tablet (30 mg total)  by mouth daily., Disp: 90 tablet, Rfl: 3 .  leflunomide (ARAVA) 20 MG tablet, Take 1 tablet (20 mg total) by mouth daily., Disp: 30 tablet, Rfl: 0 .  lisinopril (PRINIVIL,ZESTRIL) 40 MG tablet, Take 1 tablet (40 mg total) by mouth daily., Disp: 90 tablet, Rfl: 1 .  loratadine (CLARITIN) 10 MG tablet, Take 1 tablet (10 mg total) by mouth daily., Disp: 30 tablet, Rfl: 6 .  metoprolol succinate (TOPROL-XL) 50 MG 24 hr tablet, Take 1 tablet (50 mg total) by mouth daily. Take  with or immediately following a meal., Disp: 90 tablet, Rfl: 1 .  mirabegron ER (MYRBETRIQ) 25 MG TB24 tablet, Take 1 tablet (25 mg total) by mouth daily., Disp: 30 tablet, Rfl: 2 .  morphine (MS CONTIN) 15 MG 12 hr tablet, Take 1 tablet (15 mg total) by mouth 3 (three) times daily., Disp: 90 tablet, Rfl: 0 .  nitroGLYCERIN (NITROSTAT) 0.4 MG SL tablet, Place 1 tablet (0.4 mg total) under the tongue every 5 (five) minutes as needed for chest pain., Disp: 25 tablet, Rfl: 3 .  oxyCODONE-acetaminophen (ROXICET) 5-325 MG tablet, Take 1 tablet by mouth every 8 (eight) hours as needed for severe pain., Disp: 90 tablet, Rfl: 0 .  rizatriptan (MAXALT) 10 MG tablet, Take 1 tablet for headache.  May repeat once in 2 hours if needed.  Do not exceed 2 tablets in 24 hours., Disp: 10 tablet, Rfl: 3 .  sertraline (ZOLOFT) 25 MG tablet, Take 1 tablet (25 mg total) by mouth daily., Disp: 30 tablet, Rfl: 2 .  ticagrelor (BRILINTA) 90 MG TABS tablet, Take 1 tablet (90 mg total) by mouth 2 (two) times daily., Disp: 180 tablet, Rfl: 1  Allergies  Allergen Reactions  . Ampicillin Nausea Only  . Asa [Aspirin] Diarrhea  . Darvon [Propoxyphene] Nausea And Vomiting  . Salicylates Other (See Comments)  . Tetracyclines & Related Other (See Comments)   Review of Systems   Review of Systems  Constitutional: Negative for malaise/fatigue.  Respiratory: Negative for cough and shortness of breath.   Cardiovascular: Positive for leg swelling.  Gastrointestinal: Positive for abdominal pain. Negative for constipation, diarrhea, nausea and vomiting.  Genitourinary: Negative for dysuria and urgency.  Musculoskeletal: Positive for back pain, falls and myalgias.  Neurological: Positive for headaches. Negative for dizziness, sensory change, speech change, focal weakness, loss of consciousness and weakness.  Psychiatric/Behavioral: Negative for hallucinations and suicidal ideas.   Vitals:   Vitals:   12/13/16 1302  BP: 124/84   Pulse: 64  Temp: 98.1 F (36.7 C)  SpO2: 98%     There is no height or weight on file to calculate BMI.   Physical Exam:    Physical Exam  Constitutional: She appears well-developed and well-nourished. No distress.  HENT:  Head: Normocephalic and atraumatic.  Eyes: EOM are normal. Pupils are equal, round, and reactive to light.  Neck: Normal range of motion. Neck supple. No JVD present.  Cardiovascular: Normal rate, regular rhythm and intact distal pulses.   Pulmonary/Chest: Effort normal. No respiratory distress. She has no wheezes. She has no rales.  Abdominal: Soft. Bowel sounds are normal.  Neurological: She is alert.  Skin: Skin is warm. She is not diaphoretic.  Psychiatric: She has a normal mood and affect. Her behavior is normal.  Nursing note and vitals reviewed.    Results for orders placed or performed in visit on 12/13/16  POCT urinalysis dipstick  Result Value Ref Range   Color, UA yellow  Clarity, UA clear    Glucose, UA negative    Bilirubin, UA negative    Ketones, UA negative    Spec Grav, UA >=1.030 (A) 1.010 - 1.025   Blood, UA negative    pH, UA 6.0 5.0 - 8.0   Protein, UA 15 mg/dl    Urobilinogen, UA 0.2 0.2 or 1.0 E.U./dL   Nitrite, UA negative    Leukocytes, UA Negative Negative   EXAM: LUMBAR SPINE - 2-3 VIEW  IMPRESSION: Diffuse multilevel degenerative change with grade 1 mild anterolisthesis L3-L4 and L4-L5. No acute abnormality identified.  EXAM: RIGHT RIBS AND CHEST - 3+ VIEW  IMPRESSION: No acute cardiopulmonary findings and no definite acute right-sided rib fractures.  Assessment and Plan:    Nadene was seen today for follow-up, headache and pain.  Diagnoses and all orders for this visit:  Low back pain, unspecified back pain laterality, unspecified chronicity, with sciatica presence unspecified Comments Acute on chronic issue. Worse after recent fall. Xray without new findings. Urine clear. She is already on a  significant amount of opioids for pain. Nortriptyline removed after last visit. The patient admits that she is not moving around much at home. This is likely worsening her symptoms. Orders: -     POCT urinalysis dipstick -     DG Lumbar Spine 2-3 Views  Rib pain on right side Comments: s/p fall. No findings on xray. Contusion. Reviewed timeline for healing to be several weeks.  Orders: -     DG Ribs Unilateral W/Chest Right  SOB (shortness of breath) Comments: At the end of the patient's visit, she had an episode of coughing. It started when she sat up after her last xray. Because she was so uncomfortable, she was transferred to the ER by ambulance at her request, with support by me.   Dysphagia, unspecified type Comments: Likely the etiology of the patient's coughing today. No aspiration on xray. Vitals WNL. Will need GI and ST evaluation. Orders: -     esomeprazole (NEXIUM) 40 MG capsule; Take 1 capsule (40 mg total) by mouth daily.   As previously discussed with the patient, I believe that she would benefit from [facility] referral. This would provide transportation and care daily.    CMA served as Education administrator during this visit. History, Physical, and Plan performed by medical provider. Documentation and orders reviewed and attested to. Briscoe Deutscher, D.O.  Briscoe Deutscher, [place], [place] 12/24/2016  Follow-up: No Follow-up on file.

## 2016-12-13 NOTE — ED Triage Notes (Signed)
Patient arrives from [place] after getting SOB walking to xray. Patients main complaint is back pain. Also adds having trouble swallowing x several weeks - unable to take her regular meds today. "Been feeling shitty the last week".

## 2016-12-13 NOTE — ED Provider Notes (Signed)
[facility] DEPT Provider Note   CSN: [id number] Arrival date & time: 12/13/16  1507     History   Chief Complaint Chief Complaint  Patient presents with  . Back Pain    HPI Alisha [name] is a 66 y.o. female.  HPI  The patient is a 66 year old very obese female with a history of acid reflux, hypertension, hyperlipidemia, asthma and arthritis with 2 prior coronary stents who presents with multiple complaints:  #1 - the patient states that she has had several falls over the last several weeks, she reports that she loses her balance, she walks with a cane, each fall his and with her landing on her buttock and lower back. There has been no numbness or weakness of the legs and she has been able to continue to walk but states that she has had increasing amounts of lower back pain. Additionally she has also noticed some small amounts of incontinence and has been wearing her depends undergarment. Most the time she can get to the bathroom okay but sometimes she does have some dribbling. She does report that she has had workup today at the doctor's office but before the workup was completed she was sent to the emergency department because of her shortness of breath and ongoing back pain. The patient denies to me that she short of breath.  #2  - the patient reports that she has had some difficulty swallowing. The pain is located on the right side of her neck. It is worse when she swallows. She has had some swollen glands on that side as well. She has some difficulty with her mucous and has been placed on prescription strength Mucinex from her family doctor, she has also been seen by the otolaryngologist in the past with scoping which did not reveal any specific pathology according to the patient. Over the last several days she has had decreased ability to swallow food and fluids and in fact has not been able to even take her pills stating that they come back up.     Past Medical History:    Diagnosis Date  . Arthritis   . Asthma   . CAD (coronary artery disease) 07/07/2016   a. s/p stenting x 2 in [place] to the LAD, RCA is chronically occluded, LVEDP was 34  . Depression   . Fibromyalgia   . GERD (gastroesophageal reflux disease)   . History of benign pituitary tumor   . Hyperlipidemia   . Hypertension   . Migraine   . Morbid obesity ([place])   . Vertigo     Patient Active Problem List   Diagnosis Date Noted  . Polypharmacy 11/25/2016  . Venous stasis of both lower extremities 10/04/2016  . Poor dentition 10/04/2016  . OSA (obstructive sleep apnea) 10/04/2016  . History of nephrolithiasis 10/04/2016  . Hx of pilonidal cyst 10/04/2016  . Bipolar depression ([place]) 10/04/2016  . Personal history of DVT (deep vein thrombosis) 10/04/2016  . Morbid obesity ([place]) 09/25/2016  . 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 Essential hypertension 09/25/2016  . 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 Vision impairment 09/25/2016  . Dysthymia 09/25/2016  . Chronic seasonal allergic rhinitis due to pollen 09/25/2016  . Moderate persistent asthma without complication 62/13/0865  . Gastroesophageal reflux disease without esophagitis 09/25/2016  . OAB (overactive bladder) 09/25/2016  . Chronic pain syndrome 09/25/2016    Past Surgical History:  Procedure Laterality Date  . carpel tunnel release Bilateral 2017  .  EXCISION / CURETTAGE BONE CYST PHALANGES OF FOOT  2014   Removal of foot cyst   . PARTIAL HYSTERECTOMY  unknown   Patient still has ovaries  . pituitary tumor removal  1999  . TONSILLECTOMY    . TOTAL KNEE ARTHROPLASTY     TKR X 3    OB History    No data available       Home Medications    Prior to Admission medications   Medication Sig Start Date End Date Taking? Authorizing Provider  albuterol (PROVENTIL HFA;VENTOLIN HFA) 108 (90 Base) MCG/ACT inhaler Inhale 2 puffs into the lungs every 6 (six) hours as needed  for wheezing or shortness of breath. 10/06/16   Briscoe Deutscher, DO  albuterol (PROVENTIL) (2.5 MG/3ML) 0.083% nebulizer solution Take 3 mLs (2.5 mg total) by nebulization every 6 (six) hours as needed for wheezing or shortness of breath. 11/10/16   Briscoe Deutscher, DO  amLODipine (NORVASC) 10 MG tablet Take 1 tablet (10 mg total) by mouth daily. 10/23/16   Minus Breeding, MD  aspirin EC 81 MG tablet Take 1 tablet (81 mg total) by mouth daily. 10/23/16   Minus Breeding, MD  atorvastatin (LIPITOR) 20 MG tablet Take 1 tablet (20 mg total) by mouth daily. 11/22/16 02/20/17  Evelene Croon Barrett, PA-C  budesonide-formoterol (SYMBICORT) 160-4.5 MCG/ACT inhaler Inhale 2 puffs into the lungs 2 (two) times daily. 10/06/16   Briscoe Deutscher, DO  esomeprazole (NEXIUM) 40 MG capsule Take 1 capsule (40 mg total) by mouth daily. 12/13/16   Briscoe Deutscher, DO  furosemide (LASIX) 20 MG tablet Take 2 tablets (40 mg total) by mouth daily. Patient taking differently: Take 20 mg by mouth 2 (two) times daily.  10/11/16   Rogelia Mire, NP  gabapentin (NEURONTIN) 600 MG tablet Take 1 tablet (600 mg total) by mouth 3 (three) times daily. 10/06/16   Briscoe Deutscher, DO  isosorbide mononitrate (IMDUR) 30 MG 24 hr tablet Take 1 tablet (30 mg total) by mouth daily. 10/03/16 01/01/17  Minus Breeding, MD  leflunomide (ARAVA) 20 MG tablet Take 1 tablet (20 mg total) by mouth daily. 10/06/16   Briscoe Deutscher, DO  lisinopril (PRINIVIL,ZESTRIL) 40 MG tablet Take 1 tablet (40 mg total) by mouth daily. 10/23/16   Minus Breeding, MD  loratadine (CLARITIN) 10 MG tablet Take 1 tablet (10 mg total) by mouth daily. 10/06/16   Briscoe Deutscher, DO  methylPREDNISolone (MEDROL DOSEPAK) 4 MG TBPK tablet Disp 21 tabs - taper over 6 days 12/13/16   Noemi Chapel, MD  metoprolol succinate (TOPROL-XL) 50 MG 24 hr tablet Take 1 tablet (50 mg total) by mouth daily. Take with or immediately following a meal. 10/23/16   Minus Breeding, MD  mirabegron ER (MYRBETRIQ) 25 MG TB24 tablet  Take 1 tablet (25 mg total) by mouth daily. 10/06/16   Briscoe Deutscher, DO  morphine (MS CONTIN) 15 MG 12 hr tablet Take 1 tablet (15 mg total) by mouth 3 (three) times daily. 11/10/16   Briscoe Deutscher, DO  nitroGLYCERIN (NITROSTAT) 0.4 MG SL tablet Place 1 tablet (0.4 mg total) under the tongue every 5 (five) minutes as needed for chest pain. 10/03/16 01/01/17  Minus Breeding, MD  oxyCODONE-acetaminophen (ROXICET) 5-325 MG tablet Take 1 tablet by mouth every 8 (eight) hours as needed for severe pain. 09/25/16   Briscoe Deutscher, DO  rizatriptan (MAXALT) 10 MG tablet Take 1 tablet for headache.  May repeat once in 2 hours if needed.  Do not exceed 2 tablets in  24 hours. 10/25/16   Pieter Partridge, DO  sertraline (ZOLOFT) 25 MG tablet Take 1 tablet (25 mg total) by mouth daily. 10/06/16   Briscoe Deutscher, DO  ticagrelor (BRILINTA) 90 MG TABS tablet Take 1 tablet (90 mg total) by mouth 2 (two) times daily. 10/23/16   Minus Breeding, MD    Family History Family History  Problem Relation Age of Onset  . Heart disease Mother 36    CABG, pacemaker, valve  . Cancer Father   . Heart disease Sister     Fluid around the heart    Social History Social History  Substance Use Topics  . Smoking status: Never Smoker  . Smokeless tobacco: Never Used  . Alcohol use Yes     Comment: Socially. [name].     Allergies   Ampicillin; Asa [aspirin]; Darvon [propoxyphene]; Salicylates; and Tetracyclines & related   Review of Systems Review of Systems  All other systems reviewed and are negative.    Physical Exam Updated Vital Signs BP (!) 170/88   Pulse (!) 59   Temp 98.2 F (36.8 C) (Oral)   Resp 10   SpO2 98%   Physical Exam  Constitutional: She appears well-developed and well-nourished. No distress.  HENT:  Head: Normocephalic and atraumatic.  Mouth/Throat: Oropharynx is clear and moist. No oropharyngeal exudate.  Eyes: Conjunctivae and EOM are normal. Pupils are equal, round, and reactive to light.  Right eye exhibits no discharge. Left eye exhibits no discharge. No scleral icterus.  Neck: Normal range of motion. Neck supple. No JVD present. No thyromegaly present.  Cardiovascular: Normal rate, regular rhythm, normal heart sounds and intact distal pulses.  Exam reveals no gallop and no friction rub.   No murmur heard. Normal pulses, no murmurs, no JVD  Pulmonary/Chest: Effort normal and breath sounds normal. No respiratory distress. She has no wheezes. She has no rales. She exhibits no tenderness.  Abdominal: Soft. Bowel sounds are normal. She exhibits no distension and no mass. There is tenderness ( Minimal tenderness in the suprapubic region, no other abdominal tenderness).  Musculoskeletal: Normal range of motion. She exhibits edema ( 2+ pitting edema, symmetrical). She exhibits no tenderness.  Lymphadenopathy:    She has cervical adenopathy ( Right sided submandibular lymphadenopathy mildly tender, no visible asymmetry or masses, no trismus or torticollis).  Neurological: She is alert. Coordination normal.  Moves all 4 extremities, she is able to lift both arms with normal grips, no pronator drift, normal crit in addition, she is able to lift both legs off the bed with 5 out of 5 strength at the hip flexors. She has normal strength at the ankles with both dorsiflexion and plantarflexion, there is slight decreased sensation of the right foot to light touch. Cranial nerves III through XII appear normal  Skin: Skin is warm and dry. No rash noted. No erythema.  Psychiatric: She has a normal mood and affect. Her behavior is normal.  Nursing note and vitals reviewed.    ED Treatments / Results  Labs (all labs ordered are listed, but only abnormal results are displayed) Labs Reviewed  CBC WITH DIFFERENTIAL/PLATELET - Abnormal; Notable for the following:       Result Value   Hemoglobin 11.9 (*)    All other components within normal limits  COMPREHENSIVE METABOLIC PANEL  LIPASE, BLOOD   URINALYSIS, ROUTINE W REFLEX MICROSCOPIC    EKG  EKG Interpretation None       Radiology Dg Ribs Unilateral W/chest Right  Result  Date: 12/13/2016 CLINICAL DATA:  Bilateral lower back pain and rib pain. Fell last week. EXAM: RIGHT RIBS AND CHEST - 3+ VIEW COMPARISON:  None. FINDINGS: The cardiac silhouette, mediastinal and hilar contours are within normal limits. There is mild tortuosity of the thoracic aorta. The lungs are clear. No pleural effusion or pneumothorax. The bony thorax is intact. Dedicated views of the right ribs do not demonstrate any definite acute right-sided rib fractures. No pleural thickening. IMPRESSION: No acute cardiopulmonary findings and no definite acute right-sided rib fractures. Electronically Signed   By: Marijo Sanes M.D.   On: 12/13/2016 16:49   Dg Lumbar Spine 2-3 Views  Result Date: 12/13/2016 CLINICAL DATA:  Low back pain.  Fall. EXAM: LUMBAR SPINE - 2-3 VIEW COMPARISON:  No recent prior . FINDINGS: Lumbar vertebra number with the lowest ribbed vertebra as T12. Diffuse degenerative change. No acute bony abnormality identified. 2 mm anterolisthesis L3-L4 and L4-L5 . IMPRESSION: Diffuse multilevel degenerative change with grade 1 mild anterolisthesis L3-L4 and L4-L5. No acute abnormality identified. Electronically Signed   By: Marcello Moores  Register   On: 12/13/2016 16:49   Ct Soft Tissue Neck W Contrast  Result Date: 12/13/2016 CLINICAL DATA:  66 y/o  F; right-sided neck pain and dysphasia. EXAM: CT NECK WITH CONTRAST TECHNIQUE: Multidetector CT imaging of the neck was performed using the standard protocol following the bolus administration of intravenous contrast. CONTRAST:  79mL ISOVUE-300 IOPAMIDOL (ISOVUE-300) INJECTION 61% COMPARISON:  None. FINDINGS: Pharynx and larynx: No tonsillar enlargement or exophytic mucosal mass or is identified. There is effacement of the airway at the level of soft palate, hypopharynx, and larynx. Salivary glands: No inflammation,  mass, or stone. Thyroid: Subcentimeter thyroid nodules bilaterally. Otherwise normal. Lymph nodes: Calcified nonenlarged upper mediastinal lymph nodes compatible sequelae prior granulomatous disease. Vascular: Negative. Limited intracranial: Postsurgical changes related to a right temporal craniotomy with encephalomalacia partially visualized in the right anterior temporal and inferolateral frontal lobes. Visualized orbits: Negative. Mastoids and visualized paranasal sinuses: Mucosal thickening of the nasal passages. Large right maxillary sinus mucous retention cyst. Normally aerated mastoid air cells. Skeleton: Advanced cervical spondylosis with large anterior marginal osteophytes including a prominent osteophyte of the level of the larynx arising from the C3 vertebral body. Reversal of cervical curvature at C2-3. Multiple levels of canal stenosis greatest at the C5-6 level where it is at least moderate. Bony foraminal encroachment due to uncovertebral and facet hypertrophy bilaterally. Upper chest: Negative. Other: None. IMPRESSION: 1. Effacement of airway at level of soft palate, hypopharynx and larynx without discrete mass identified may represent mucosal thickening and or inflammation. 2. Prominent anterior marginal osteophytes arising from the C3 vertebral body at level of larynx may contribute to dysphagia. 3. Advanced cervical spondylosis greatest at C5-6 where there is at least moderate canal stenosis. Electronically Signed   By: Kristine Garbe M.D.   On: 12/13/2016 20:30   Mr Lumbar Spine Wo Contrast  Result Date: 12/13/2016 CLINICAL DATA:  Low back pain for 2 weeks. Multiple falls. Right leg numbness when she sits. The patient has had cardiac stents.  SAR limits were observed. EXAM: MRI LUMBAR SPINE WITHOUT CONTRAST TECHNIQUE: Multiplanar, multisequence MR imaging of the lumbar spine was performed. No intravenous contrast was administered. COMPARISON:  Lumbar spine radiographs from the same  day. FINDINGS: Segmentation: 5 non rib-bearing lumbar type vertebral bodies are present. Alignment: Slight anterolisthesis is present at L4-5. Alignment is otherwise anatomic. Vertebrae: Marrow signal is diffusely depressed. Endplate marrow changes are present at L4-5. Conus  medullaris: Extends to the T12-L1 level and appears normal. Paraspinal and other soft tissues: A 6.7 cm cyst is noted at the lower pole of the right kidney. No other focal lesions are present. There is no significant adenopathy. Paraspinous muscular atrophy is noted. Disc levels: Congenitally short pedicles contribute to lumbar spinal stenosis. L1-2:  Negative. L2-3: A broad-based disc protrusion is present. Advanced facet hypertrophy is noted bilaterally. This results in moderate central canal narrowing and mild right foraminal stenosis. L3-4: A right paramedian disc protrusion is present. Advanced facet hypertrophy is noted bilaterally. Severe right and moderate left subarticular narrowing is present. Mild right foraminal narrowing is noted. L4-5: A broad-based disc protrusion is present. Advanced facet hypertrophy is worse on the left. This results in moderate subarticular narrowing bilaterally. Moderate foraminal narrowing is present bilaterally. L5-S1: Advanced facet hypertrophy is worse on the left. There is no significant stenosis. IMPRESSION: 1. Congenital and acquired multilevel spinal stenosis. 2. Multilevel advanced facet hypertrophy as described. 3. Moderate central canal stenosis bilaterally and mild right foraminal narrowing at L2-3. 4. Severe right and moderate left subarticular stenosis at L3-4. 5. Moderate central and bilateral foraminal stenosis at L4-5. Electronically Signed   By: San Morelle M.D.   On: 12/13/2016 19:55    Procedures Procedures (including critical care time)  Medications Ordered in ED Medications  morphine 4 MG/ML injection 4 mg (4 mg Intravenous Given 12/13/16 1808)  furosemide (LASIX)  injection 40 mg (40 mg Intravenous Given 12/13/16 1808)  iopamidol (ISOVUE-300) 61 % injection (75 mLs  Contrast Given 12/13/16 1957)  fentaNYL (SUBLIMAZE) injection 50 mcg (50 mcg Intravenous Given 12/13/16 2036)     Initial Impression / Assessment and Plan / ED Course  I have reviewed the triage vital signs and the nursing notes.  Pertinent labs & imaging results that were available during my care of the patient were reviewed by me and considered in my medical decision making (see chart for details).    Overall the patient has had several symptoms which are of concern including this worsening back pain. Her physical exam was rather unremarkable except for some slight decreased sensation of the right foot compared to the left. I do not notice any focal weakness, she does not have any urinary retention according to a bladder scan which showed less than 100 mL of urine. She has normal lung sounds, normal heart sounds, no tenderness over the ribs. She does have the tenderness over the back which raises concern for possible fractures of the spine. We'll obtain MRI imaging of the lower back as well as a CT scan with contrast of the neck to rule out masses, foreign bodies, growths or tumors. That being said the patient is tolerating her secretions and has no difficulty with respirations whatsoever. Pain control offered, Lasix IV given due to the patient's edema. She has not been able to take her Lasix because of vomiting   The patient has done very well, she went to the MRI machine and has no significant findings of acute fracture, herniation abscess or hematomas, there is lots of spinal stenosis however. She has a normal neurologic exam of the legs and can be discharged home. I discussed her throat findings on the CT scan of the neck with the ear nose and throat surgeon, Dr. Benjamine Mola who has agreed to see the patient in clinic, he has looked at the CT scan findings and agrees that there is nothing acute, agrees  with steroid pack, the patient is stable for  discharge. She was informed of the results and is in agreement.  Final Clinical Impressions(s) / ED Diagnoses   Final diagnoses:  Weakness  Fall  Chronic bilateral low back pain without sciatica  Pharyngeal dysphagia    New Prescriptions New Prescriptions   METHYLPREDNISOLONE (MEDROL DOSEPAK) 4 MG TBPK TABLET    Disp 21 tabs - taper over 6 days     Noemi Chapel, MD 12/13/16 2116

## 2016-12-14 ENCOUNTER — Telehealth: Payer: Self-pay | Admitting: *Deleted

## 2016-12-14 NOTE — Telephone Encounter (Signed)
[facility] referral faxed.

## 2016-12-15 ENCOUNTER — Ambulatory Visit (HOSPITAL_COMMUNITY): Payer: Medicare Other

## 2016-12-18 ENCOUNTER — Ambulatory Visit (HOSPITAL_COMMUNITY): Payer: Medicare Other

## 2016-12-20 ENCOUNTER — Ambulatory Visit (HOSPITAL_COMMUNITY): Payer: Medicare Other

## 2016-12-21 ENCOUNTER — Ambulatory Visit: Payer: Medicare Other | Admitting: Family Medicine

## 2016-12-21 ENCOUNTER — Telehealth: Payer: Self-pay | Admitting: Radiology

## 2016-12-21 NOTE — Telephone Encounter (Signed)
Welfare call- Pt was a no show for today's appointment. [employer] employee arrived to pick up Pt. I called Pt at home to confirm that she was doing ok. Pt stated that [employer] had cancelled her initial pick up to come to her appointment today. Pt states she will call at a later time to reschedule.

## 2016-12-22 ENCOUNTER — Ambulatory Visit (HOSPITAL_COMMUNITY): Payer: Medicare Other

## 2016-12-25 ENCOUNTER — Ambulatory Visit (HOSPITAL_COMMUNITY): Payer: Medicare Other

## 2016-12-27 ENCOUNTER — Ambulatory Visit (HOSPITAL_COMMUNITY): Payer: Medicare Other

## 2016-12-29 ENCOUNTER — Ambulatory Visit (HOSPITAL_COMMUNITY): Payer: Medicare Other

## 2017-01-01 ENCOUNTER — Ambulatory Visit (HOSPITAL_COMMUNITY): Payer: Medicare Other

## 2017-01-02 ENCOUNTER — Telehealth (HOSPITAL_COMMUNITY): Payer: Self-pay | Admitting: Family Medicine

## 2017-01-02 NOTE — Telephone Encounter (Signed)
Verified Medicare A/B insurance benefits through [employer] [phone].... KJ

## 2017-01-03 ENCOUNTER — Ambulatory Visit (HOSPITAL_COMMUNITY): Payer: Medicare Other

## 2017-01-04 ENCOUNTER — Ambulatory Visit (HOSPITAL_COMMUNITY): Payer: Medicare Other

## 2017-01-05 ENCOUNTER — Encounter (HOSPITAL_COMMUNITY): Payer: Medicare Other

## 2017-01-05 ENCOUNTER — Ambulatory Visit (HOSPITAL_COMMUNITY): Payer: Medicare Other

## 2017-01-08 ENCOUNTER — Encounter (HOSPITAL_COMMUNITY): Payer: Medicare Other

## 2017-01-08 ENCOUNTER — Ambulatory Visit (HOSPITAL_COMMUNITY): Payer: Medicare Other

## 2017-01-10 ENCOUNTER — Ambulatory Visit (HOSPITAL_COMMUNITY): Payer: Medicare Other

## 2017-01-10 ENCOUNTER — Encounter (HOSPITAL_COMMUNITY): Payer: Medicare Other

## 2017-01-10 NOTE — Progress Notes (Signed)
Cardiology Office Note   Date:  01/11/2017   ID:  Alisha [name], DOB [birth year], MRN [id number]  PCP:  Briscoe Deutscher, DO  Cardiologist:   Minus Breeding, MD  Referring:  Briscoe Deutscher, DO  Chief Complaint  Patient presents with  . Coronary Artery Disease      History of Present Illness: Alisha [name] is a 66 y.o. female who presents for evaluation of coronary disease. She is just moved here from [place]. She reports that in late October she presented with chest pain and received 2 stents to the LAD with an occluded RCA.  (I was able to review outside records for this appt.)  She has been seen a couple of times in our clinic since I last saw her.  She had an elevated LDL and was started on statin.  She was having leg pain but had normal ABIs.  She gets around in a scooter or with a walker or cane.  She has been doing relatively well.  The patient denies any new symptoms such as chest discomfort, neck or arm discomfort. There has been no new shortness of breath, PND or orthopnea. There have been no reported palpitations, presyncope or syncope.  She does have some leg cramps.  She was getting some leg swelling but she had accidentally missed her Lasix.    Past Medical History:  Diagnosis Date  . Arthritis   . Asthma   . CAD (coronary artery disease) 07/07/2016   a. s/p stenting x 2 in [place] to the LAD, RCA is chronically occluded, LVEDP was 34  . Depression   . Fibromyalgia   . GERD (gastroesophageal reflux disease)   . History of benign pituitary tumor   . Hyperlipidemia   . Hypertension   . Migraine   . Morbid obesity ([place])   . Vertigo     Past Surgical History:  Procedure Laterality Date  . carpel tunnel release Bilateral 2017  . EXCISION / CURETTAGE BONE CYST PHALANGES OF FOOT  2014   Removal of foot cyst   . PARTIAL HYSTERECTOMY  unknown   Patient still has ovaries  . pituitary tumor removal  1999  . TONSILLECTOMY    . TOTAL KNEE ARTHROPLASTY     TKR X 3      Current Outpatient Prescriptions  Medication Sig Dispense Refill  . albuterol (PROVENTIL HFA;VENTOLIN HFA) 108 (90 Base) MCG/ACT inhaler Inhale 2 puffs into the lungs every 6 (six) hours as needed for wheezing or shortness of breath. 1 Inhaler 6  . albuterol (PROVENTIL) (2.5 MG/3ML) 0.083% nebulizer solution Take 3 mLs (2.5 mg total) by nebulization every 6 (six) hours as needed for wheezing or shortness of breath. 150 mL 1  . amLODipine (NORVASC) 10 MG tablet Take 1 tablet (10 mg total) by mouth daily. 90 tablet 1  . aspirin EC 81 MG tablet Take 1 tablet (81 mg total) by mouth daily. 90 tablet 1  . atorvastatin (LIPITOR) 20 MG tablet Take 1 tablet (20 mg total) by mouth daily. 90 tablet 1  . budesonide-formoterol (SYMBICORT) 160-4.5 MCG/ACT inhaler Inhale 2 puffs into the lungs 2 (two) times daily. 1 Inhaler 6  . esomeprazole (NEXIUM) 40 MG capsule Take 1 capsule (40 mg total) by mouth daily. 30 capsule 3  . furosemide (LASIX) 20 MG tablet Take 2 tablets (40 mg total) by mouth daily. (Patient taking differently: Take 20 mg by mouth 2 (two) times daily. ) 60 tablet 3  . gabapentin (NEURONTIN) 600 MG tablet  Take 1 tablet (600 mg total) by mouth 3 (three) times daily. 90 tablet 3  . isosorbide dinitrate (ISORDIL) 30 MG tablet Take 30 mg by mouth daily.    Marland Kitchen leflunomide (ARAVA) 20 MG tablet Take 1 tablet (20 mg total) by mouth daily. 30 tablet 0  . lisinopril (PRINIVIL,ZESTRIL) 40 MG tablet Take 1 tablet (40 mg total) by mouth daily. 90 tablet 1  . loratadine (CLARITIN) 10 MG tablet Take 1 tablet (10 mg total) by mouth daily. 30 tablet 6  . methylPREDNISolone (MEDROL DOSEPAK) 4 MG TBPK tablet Disp 21 tabs - taper over 6 days 21 tablet 0  . metoprolol succinate (TOPROL-XL) 50 MG 24 hr tablet Take 1 tablet (50 mg total) by mouth daily. Take with or immediately following a meal. 90 tablet 1  . mirabegron ER (MYRBETRIQ) 25 MG TB24 tablet Take 1 tablet (25 mg total) by mouth daily. 30 tablet 2  .  morphine (MS CONTIN) 15 MG 12 hr tablet Take 1 tablet (15 mg total) by mouth 3 (three) times daily. 90 tablet 0  . oxyCODONE-acetaminophen (ROXICET) 5-325 MG tablet Take 1 tablet by mouth every 8 (eight) hours as needed for severe pain. 90 tablet 0  . rizatriptan (MAXALT) 10 MG tablet Take 1 tablet for headache.  May repeat once in 2 hours if needed.  Do not exceed 2 tablets in 24 hours. 10 tablet 3  . sertraline (ZOLOFT) 25 MG tablet Take 1 tablet (25 mg total) by mouth daily. 30 tablet 2  . ticagrelor (BRILINTA) 90 MG TABS tablet Take 1 tablet (90 mg total) by mouth 2 (two) times daily. 180 tablet 1  . nitroGLYCERIN (NITROSTAT) 0.4 MG SL tablet Place 1 tablet (0.4 mg total) under the tongue every 5 (five) minutes as needed for chest pain. 25 tablet 3   No current facility-administered medications for this visit.     Allergies:   Ampicillin; Asa [aspirin]; Darvon [propoxyphene]; Salicylates; and Tetracyclines & related     ROS:  Please see the history of present illness.   Otherwise, review of systems are positive for none.   All other systems are reviewed and negative.    PHYSICAL EXAM: VS:  BP 130/72   Pulse 66   Ht 5' 6.5" (1.689 m)   Wt 291 lb 12.8 oz (132.4 kg)   SpO2 95%   BMI 46.39 kg/m  , BMI Body mass index is 46.39 kg/m.  GEN:  No distress NECK:  No jugular venous distention at 90 degrees, waveform within normal limits, carotid upstroke brisk and symmetric, no bruits, no thyromegaly LYMPHATICS:  No cervical adenopathy LUNGS:  Clear to auscultation bilaterally BACK:  No CVA tenderness CHEST:  Unremarkable HEART:  S1 and S2 within normal limits, no S3, no S4, no clicks, no rubs, no murmurs ABD:  Positive bowel sounds normal in frequency in pitch, no bruits, no rebound, no guarding, unable to assess midline mass or bruit with the patient seated. EXT:  2 plus pulses throughout, moderate edema, no cyanosis no clubbing SKIN:  No rashes no nodules NEURO:  Cranial nerves II  through XII grossly intact, motor grossly intact throughout PSYCH:  Cognitively intact, oriented to person place and time   EKG:  EKG is not ordered today.   Recent Labs: 12/13/2016: ALT 18; BUN 7; Creatinine, Ser 0.78; Hemoglobin 11.9; Platelets 191; Potassium 3.7; Sodium 144    Lipid Panel    Component Value Date/Time   CHOL 172 11/15/2016 1013   TRIG 149  11/15/2016 1013   HDL 37 (L) 11/15/2016 1013   CHOLHDL 4.6 11/15/2016 1013   VLDL 30 11/15/2016 1013   LDLCALC 105 (H) 11/15/2016 1013      Wt Readings from Last 3 Encounters:  01/11/17 291 lb 12.8 oz (132.4 kg)  11/09/16 286 lb 9.6 oz (130 kg)  11/02/16 289 lb 7.4 oz (131.3 kg)      Other studies Reviewed: Additional studies/ records that were reviewed today include: None. Review of the above records demonstrates:   ASSESSMENT AND PLAN:  CAD:   The patient has no new sypmtoms.  No further cardiovascular testing is indicated.  We will continue with aggressive risk reduction and meds as listed.  LEG PAIN:  She had normal ABIs.  This is leg cramping and we talked about PRN dosing.   HTN:  The blood pressure is at target. No change in medications is indicated. We will continue with therapeutic lifestyle changes (TLC).  HYPERLIPIDEMIA:   Her labs were as above and she was started back on statin.  She will get a lipid profile in July     Current medicines are reviewed at length with the patient today.  The patient does not have concerns regarding medicines.  The following changes have been made:  None  Labs/ tests ordered today include:  None  Orders Placed This Encounter  Procedures  . Lipid panel     Disposition:   FU with Rhonda Barrett in six months.    Signed, Minus Breeding, MD  01/11/2017 12:54 PM    [facility]

## 2017-01-11 ENCOUNTER — Ambulatory Visit (INDEPENDENT_AMBULATORY_CARE_PROVIDER_SITE_OTHER): Payer: Medicare Other | Admitting: Cardiology

## 2017-01-11 ENCOUNTER — Encounter: Payer: Self-pay | Admitting: Cardiology

## 2017-01-11 VITALS — BP 130/72 | HR 66 | Ht 66.5 in | Wt 291.8 lb

## 2017-01-11 DIAGNOSIS — I10 Essential (primary) hypertension: Secondary | ICD-10-CM | POA: Diagnosis not present

## 2017-01-11 DIAGNOSIS — I25119 Atherosclerotic heart disease of native coronary artery with unspecified angina pectoris: Secondary | ICD-10-CM | POA: Diagnosis not present

## 2017-01-11 DIAGNOSIS — I2510 Atherosclerotic heart disease of native coronary artery without angina pectoris: Secondary | ICD-10-CM | POA: Diagnosis not present

## 2017-01-11 DIAGNOSIS — E785 Hyperlipidemia, unspecified: Secondary | ICD-10-CM | POA: Diagnosis not present

## 2017-01-11 NOTE — Patient Instructions (Signed)
Medication Instructions:  Continue current medications  Labwork: Fasting Lipids  Testing/Procedures: None Ordered  Follow-Up: Your physician wants you to follow-up in: 6 Months. You will receive a reminder letter in the mail two months in advance. If you don't receive a letter, please call our office to schedule the follow-up appointment.   Any Other Special Instructions Will Be Listed Below (If Applicable).   If you need a refill on your cardiac medications before your next appointment, please call your pharmacy.

## 2017-01-12 ENCOUNTER — Ambulatory Visit (HOSPITAL_COMMUNITY): Payer: Medicare Other

## 2017-01-12 ENCOUNTER — Telehealth (HOSPITAL_COMMUNITY): Payer: Self-pay

## 2017-01-12 ENCOUNTER — Encounter (HOSPITAL_COMMUNITY): Payer: Medicare Other

## 2017-01-12 ENCOUNTER — Telehealth: Payer: Self-pay | Admitting: Family Medicine

## 2017-01-12 NOTE — Telephone Encounter (Signed)
Called and spoke with pt to remind her of her upcoming orientation apt .... Cherylann Banas

## 2017-01-12 NOTE — Telephone Encounter (Signed)
Noted  

## 2017-01-12 NOTE — Telephone Encounter (Signed)
[facility] called to inform they are closing the referral for this patient due to not being able to get in touch with the family.

## 2017-01-15 ENCOUNTER — Ambulatory Visit (HOSPITAL_COMMUNITY): Payer: Medicare Other

## 2017-01-15 ENCOUNTER — Encounter (HOSPITAL_COMMUNITY): Payer: Medicare Other

## 2017-01-16 ENCOUNTER — Encounter (HOSPITAL_COMMUNITY)
Admission: RE | Admit: 2017-01-16 | Discharge: 2017-01-16 | Disposition: A | Discharge status: Home / Self Care | Payer: Medicare Other | Source: Ambulatory Visit | Attending: Cardiology | Admitting: Cardiology

## 2017-01-16 ENCOUNTER — Encounter (HOSPITAL_COMMUNITY): Payer: Self-pay

## 2017-01-16 VITALS — BP 120/70 | HR 57 | Ht 65.25 in | Wt 293.0 lb

## 2017-01-16 DIAGNOSIS — I214 Non-ST elevation (NSTEMI) myocardial infarction: Secondary | ICD-10-CM

## 2017-01-16 DIAGNOSIS — Z955 Presence of coronary angioplasty implant and graft: Secondary | ICD-10-CM

## 2017-01-16 NOTE — Progress Notes (Signed)
Cardiac Rehab Medication Review by a Pharmacist  Does the patient  feel that his/her medications are working for him/her? Yes   Has the patient been experiencing any side effects to the medications prescribed? Yes - symbicort, see below   Does the patient measure his/her own blood pressure or blood glucose at home?  No, but will ask PCP for Rx for BP monitor at next appt    Does the patient have any problems obtaining medications due to transportation or finances?   No   Understanding of regimen: Fair  Understanding of indications: Fair  Potential of compliance: Good    Pharmacist comments:   Patient presents on motorized scooter and in good spirits today. Medication indications, dosing, frequency, and notable side effects reviewed with patient. Patient verbalized concern regarding her symbicort, stating it makes her "nervous". She does not take her symbicort anymore, but will follow up with her provider regarding alternatives for this medication.   She also reports taking an additional fluid pill (plus furosemide), but is unsure of the name. She reports she will bring her medications in with her next time she comes to cardiac rehab.   Lastly, she mentions her provider instructed her to discontinue one of her medications when the current prescription runs out. She believes this medication is Zoloft, but will double check with her PCP.   She made a list of the above questions and is going to go home and review her medications. I encouraged her to reach out if she has further questions.   Time offered for discussion with questions. No further questions or concerns at conclusion of our visit.   Argie Ramming, PharmD Pharmacy Resident  Pager [phone] 10/10/16 8:02 AM

## 2017-01-16 NOTE — Progress Notes (Signed)
Cardiac Individual Treatment Plan  Patient Details  Name: Alisha [name] MRN: [id number] Date of Birth: 21-Jan-1951 Referring Provider:     CARDIAC REHAB PHASE II ORIENTATION from 01/16/2017 in [place]  Referring Provider  Marijo File MD      Initial Encounter Date:    CARDIAC REHAB PHASE II ORIENTATION from 01/16/2017 in [place]  Date  01/16/17  Referring Provider  Marijo File MD      Visit Diagnosis: 05/2016 Status post coronary artery stent placement  05/2016 NSTEMI (non-ST elevated myocardial infarction) [facility])  Patient's Home Medications on Admission:  Current Outpatient Prescriptions:  .  albuterol (PROVENTIL HFA;VENTOLIN HFA) 108 (90 Base) MCG/ACT inhaler, Inhale 2 puffs into the lungs every 6 (six) hours as needed for wheezing or shortness of breath., Disp: 1 Inhaler, Rfl: 6 .  albuterol (PROVENTIL) (2.5 MG/3ML) 0.083% nebulizer solution, Take 3 mLs (2.5 mg total) by nebulization every 6 (six) hours as needed for wheezing or shortness of breath., Disp: 150 mL, Rfl: 1 .  amLODipine (NORVASC) 10 MG tablet, Take 1 tablet (10 mg total) by mouth daily., Disp: 90 tablet, Rfl: 1 .  aspirin EC 81 MG tablet, Take 1 tablet (81 mg total) by mouth daily., Disp: 90 tablet, Rfl: 1 .  atorvastatin (LIPITOR) 20 MG tablet, Take 1 tablet (20 mg total) by mouth daily., Disp: 90 tablet, Rfl: 1 .  esomeprazole (NEXIUM) 40 MG capsule, Take 1 capsule (40 mg total) by mouth daily., Disp: 30 capsule, Rfl: 3 .  furosemide (LASIX) 20 MG tablet, Take 2 tablets (40 mg total) by mouth daily. (Patient taking differently: Take 20 mg by mouth 2 (two) times daily. ), Disp: 60 tablet, Rfl: 3 .  gabapentin (NEURONTIN) 600 MG tablet, Take 1 tablet (600 mg total) by mouth 3 (three) times daily., Disp: 90 tablet, Rfl: 3 .  isosorbide dinitrate (ISORDIL) 30 MG tablet, Take 30 mg by mouth daily., Disp: , Rfl:  .  leflunomide (ARAVA) 20 MG  tablet, Take 1 tablet (20 mg total) by mouth daily., Disp: 30 tablet, Rfl: 0 .  lisinopril (PRINIVIL,ZESTRIL) 40 MG tablet, Take 1 tablet (40 mg total) by mouth daily., Disp: 90 tablet, Rfl: 1 .  loratadine (CLARITIN) 10 MG tablet, Take 1 tablet (10 mg total) by mouth daily., Disp: 30 tablet, Rfl: 6 .  metoprolol succinate (TOPROL-XL) 50 MG 24 hr tablet, Take 1 tablet (50 mg total) by mouth daily. Take with or immediately following a meal., Disp: 90 tablet, Rfl: 1 .  mirabegron ER (MYRBETRIQ) 25 MG TB24 tablet, Take 1 tablet (25 mg total) by mouth daily., Disp: 30 tablet, Rfl: 2 .  morphine (MS CONTIN) 15 MG 12 hr tablet, Take 1 tablet (15 mg total) by mouth 3 (three) times daily., Disp: 90 tablet, Rfl: 0 .  oxyCODONE-acetaminophen (ROXICET) 5-325 MG tablet, Take 1 tablet by mouth every 8 (eight) hours as needed for severe pain., Disp: 90 tablet, Rfl: 0 .  rizatriptan (MAXALT) 10 MG tablet, Take 1 tablet for headache.  May repeat once in 2 hours if needed.  Do not exceed 2 tablets in 24 hours., Disp: 10 tablet, Rfl: 3 .  sertraline (ZOLOFT) 25 MG tablet, Take 1 tablet (25 mg total) by mouth daily., Disp: 30 tablet, Rfl: 2 .  ticagrelor (BRILINTA) 90 MG TABS tablet, Take 1 tablet (90 mg total) by mouth 2 (two) times daily., Disp: 180 tablet, Rfl: 1 .  budesonide-formoterol (SYMBICORT) 160-4.5 MCG/ACT inhaler,  Inhale 2 puffs into the lungs 2 (two) times daily. (Patient not taking: Reported on 01/16/2017), Disp: 1 Inhaler, Rfl: 6 .  nitroGLYCERIN (NITROSTAT) 0.4 MG SL tablet, Place 1 tablet (0.4 mg total) under the tongue every 5 (five) minutes as needed for chest pain., Disp: 25 tablet, Rfl: 3  Past Medical History: Past Medical History:  Diagnosis Date  . Arthritis   . Asthma   . CAD (coronary artery disease) 07/07/2016   a. s/p stenting x 2 in [place] to the LAD, RCA is chronically occluded, LVEDP was 34  . Depression   . Fibromyalgia   . GERD (gastroesophageal reflux disease)   . History of  benign pituitary tumor   . Hyperlipidemia   . Hypertension   . Migraine   . Morbid obesity ([place])   . Vertigo     Tobacco Use: History  Smoking Status  . Never Smoker  Smokeless Tobacco  . Never Used    Labs: Recent Review Flowsheet Data    Labs for ITP Cardiac and Pulmonary Rehab Latest Ref Rng & Units 11/15/2016   Cholestrol <200 mg/dL 172   LDLCALC <100 mg/dL 105(H)   HDL >50 mg/dL 37(L)   Trlycerides <150 mg/dL 149      Capillary Blood Glucose: No results found for: GLUCAP   Exercise Target Goals: Date: 01/16/17  Exercise Program Goal: Individual exercise prescription set with THRR, safety & activity barriers. Participant demonstrates ability to understand and report RPE using BORG scale, to self-measure pulse accurately, and to acknowledge the importance of the exercise prescription.  Exercise Prescription Goal: Starting with aerobic activity 30 plus minutes a day, 3 days per week for initial exercise prescription. Provide home exercise prescription and guidelines that participant acknowledges understanding prior to discharge.  Activity Barriers & Risk Stratification:     Activity Barriers & Cardiac Risk Stratification - 11/02/16 1146      Activity Barriers & Cardiac Risk Stratification   Activity Barriers Left Knee Replacement;Right Knee Replacement;Neck/Spine Problems;Back Problems;Arthritis;Joint Problems;Deconditioning;Muscular Weakness;Shortness of Breath;Balance Concerns;History of Falls;Assistive Device;Other (comment)   Comments B ankle fusion, L wrist surgery, B carpal tunnel surgery, L wrist cyst removal   Cardiac Risk Stratification High      6 Minute Walk:     6 Minute Walk    Row Name 11/02/16 1135 01/16/17 1010 01/16/17 1017     6 Minute Walk   Phase Initial  -  -   Distance 0.45 feet  step test  .45km 0.48 feet  km not feet... pt did a step test  -   Walk Time 6 minutes 6 minutes  -   # of Rest Breaks 0 0  -   MPH 0.27  -  -   METS 2  2  -   RPE 12 13  -   VO2 Peak 7  -  -   Symptoms No No Yes (comment)   Comments  -  - R knee discomfort   Resting HR 62 bpm 57 bpm  -   Resting BP 110/72 120/70  -   Max Ex. HR 90 bpm 74 bpm  -   Max Ex. BP 126/70 142/60  -   2 Minute Post BP 110/74  -  -      Oxygen Initial Assessment:   Oxygen Re-Evaluation:   Oxygen Discharge (Final Oxygen Re-Evaluation):   Initial Exercise Prescription:     Initial Exercise Prescription - 01/16/17 1000      Date of Initial Exercise  RX and Referring Provider   Date 01/16/17   Referring Provider Marijo File MD     NuStep   Level 3   SPM 80   Minutes 20   METs 1.8     Arm Ergometer   Level 1   Minutes 10   METs 1.3     Prescription Details   Frequency (times per week) 3   Duration Progress to 30 minutes of continuous aerobic without signs/symptoms of physical distress     Intensity   THRR 40-80% of Max Heartrate 61-122   Ratings of Perceived Exertion 11-13   Perceived Dyspnea 0-4     Progression   Progression Continue to progress workloads to maintain intensity without signs/symptoms of physical distress.     Resistance Training   Training Prescription Yes   Weight 2lbs   Reps 10-15      Perform Capillary Blood Glucose checks as needed.  Exercise Prescription Changes:   Exercise Comments:   Exercise Goals and Review:     Exercise Goals    Row Name 11/02/16 1148 01/16/17 0911 01/16/17 0912         Exercise Goals   Increase Physical Activity Yes  -  -     Intervention Provide advice, education, support and counseling about physical activity/exercise needs.;Develop an individualized exercise prescription for aerobic and resistive training based on initial evaluation findings, risk stratification, comorbidities and participant's personal goals. Provide advice, education, support and counseling about physical activity/exercise needs.;Develop an individualized exercise prescription for aerobic and  resistive training based on initial evaluation findings, risk stratification, comorbidities and participant's personal goals.  -     Expected Outcomes Achievement of increased cardiorespiratory fitness and enhanced flexibility, muscular endurance and strength shown through measurements of functional capacity and personal statement of participant. Achievement of increased cardiorespiratory fitness and enhanced flexibility, muscular endurance and strength shown through measurements of functional capacity and personal statement of participant.  -     Increase Strength and Stamina Yes Yes -  get back to water aerobics at [facility], improve balance, strengthen back muscles and posture     Intervention Develop an individualized exercise prescription for aerobic and resistive training based on initial evaluation findings, risk stratification, comorbidities and participant's personal goals.;Provide advice, education, support and counseling about physical activity/exercise needs. Develop an individualized exercise prescription for aerobic and resistive training based on initial evaluation findings, risk stratification, comorbidities and participant's personal goals.;Provide advice, education, support and counseling about physical activity/exercise needs.  -     Expected Outcomes Achievement of increased cardiorespiratory fitness and enhanced flexibility, muscular endurance and strength shown through measurements of functional capacity and personal statement of participant. Achievement of increased cardiorespiratory fitness and enhanced flexibility, muscular endurance and strength shown through measurements of functional capacity and personal statement of participant. -  Be able to improve walking tolerance without having to use scooter        Exercise Goals Re-Evaluation :    Discharge Exercise Prescription (Final Exercise Prescription Changes):   Nutrition:  Target Goals: Understanding of nutrition guidelines,  daily intake of sodium 1500mg , cholesterol 200mg , calories 30% from fat and 7% or less from saturated fats, daily to have 5 or more servings of fruits and vegetables.  Biometrics:     Pre Biometrics - 01/16/17 1021      Pre Biometrics   % Body Fat 57.1 %       Nutrition Therapy Plan and Nutrition Goals:     Nutrition Therapy & Goals -  11/07/16 1514      Nutrition Therapy   Diet Therapeutic Lifestyle Changes     Personal Nutrition Goals   Nutrition Goal 1-2 lb wt loss per week to a wt loss goal of 6-24 lb at graduation from [place], educate and counsel regarding individualized specific dietary modifications aiming towards targeted core components such as weight, hypertension, lipid management, diabetes, heart failure and other comorbidities.   Expected Outcomes Short Term Goal: Understand basic principles of dietary content, such as calories, fat, sodium, cholesterol and nutrients.;Long Term Goal: Adherence to prescribed nutrition plan.      Nutrition Discharge: Nutrition Scores:     Nutrition Assessments - 11/07/16 1513      MEDFICTS Scores   Pre Score 24      Nutrition Goals Re-Evaluation:   Nutrition Goals Re-Evaluation:   Nutrition Goals Discharge (Final Nutrition Goals Re-Evaluation):   Psychosocial: Target Goals: Acknowledge presence or absence of significant depression and/or stress, maximize coping skills, provide positive support system. Participant is able to verbalize types and ability to use techniques and skills needed for reducing stress and depression.  Initial Review & Psychosocial Screening:     Initial Psych Review & Screening - 01/16/17 1131      Initial Review   Current issues with Current Stress Concerns;Current Anxiety/Panic   Source of Stress Concerns Chronic Illness;Unable to participate in former interests or hobbies;Unable to perform yard/household  activities;Financial;Transportation;Occupation   Comments pt with worsening deconditioning due to chronic illness notes health related anxiety is increasing.  pt eager to participate in CR activities to improve physical condition.      Family Dynamics   Good Support System? Yes  pt lives with her daughter   Comments Pt recently moved from [place] to [place] and is residing with her youngest daughter and grandchild.     Barriers   Psychosocial barriers to participate in program Psychosocial barriers identified (see note)      Quality of Life Scores:     Quality of Life - 01/16/17 1025      Quality of Life Scores   Health/Function Pre 10.75 %   Socioeconomic Pre 14.5 %   Psych/Spiritual Pre 15.33 %   Family Pre 12.63 %   GLOBAL Pre 12.96 %      PHQ-9: Recent Review Flowsheet Data    Depression screen [facility] 2/9 11/20/2016 09/25/2016   Decreased Interest 1 0   Down, Depressed, Hopeless 3 1   PHQ - 2 Score 4 1   Altered sleeping 3 -   Tired, decreased energy 3 -   Change in appetite 0 -   Feeling bad or failure about yourself  2 -   Trouble concentrating 2 -   Moving slowly or fidgety/restless 0 -   Suicidal thoughts 0 -   PHQ-9 Score 14 -   Difficult doing work/chores Very difficult -     Interpretation of Total Score  Total Score Depression Severity:  1-4 = Minimal depression, 5-9 = Mild depression, 10-14 = Moderate depression, 15-19 = Moderately severe depression, 20-27 = Severe depression   Psychosocial Evaluation and Intervention:   Psychosocial Re-Evaluation:   Psychosocial Discharge (Final Psychosocial Re-Evaluation):   Vocational Rehabilitation: Provide vocational rehab assistance to qualifying candidates.   Vocational Rehab Evaluation & Intervention:     Vocational Rehab - 01/16/17 1122      Initial Vocational Rehab Evaluation & Intervention   Assessment shows need for Vocational  Rehabilitation Yes      Education: Education Goals: Education  classes will be provided on a weekly basis, covering required topics. Participant will state understanding/return demonstration of topics presented.  Learning Barriers/Preferences:     Learning Barriers/Preferences - 01/16/17 0830      Learning Barriers/Preferences   Learning Barriers Sight   Learning Preferences Audio;Written Material;Verbal Instruction;Video      Education Topics: Count Your Pulse:  -Group instruction provided by verbal instruction, demonstration, patient participation and written materials to support subject.  Instructors address importance of being able to find your pulse and how to count your pulse when at home without a heart monitor.  Patients get hands on experience counting their pulse with staff help and individually.   Heart Attack, Angina, and Risk Factor Modification:  -Group instruction provided by verbal instruction, video, and written materials to support subject.  Instructors address signs and symptoms of angina and heart attacks.    Also discuss risk factors for heart disease and how to make changes to improve heart health risk factors.   Functional Fitness:  -Group instruction provided by verbal instruction, demonstration, patient participation, and written materials to support subject.  Instructors address safety measures for doing things around the house.  Discuss how to get up and down off the floor, how to pick things up properly, how to safely get out of a chair without assistance, and balance training.   CARDIAC REHAB PHASE II EXERCISE from 11/10/2016 in [place]  Date  11/10/16  Educator  Hector  Instruction Review Code  2- meets goals/outcomes      Meditation and Mindfulness:  -Group instruction provided by verbal instruction, patient participation, and written materials to support subject.  Instructor addresses importance of mindfulness and meditation practice to help reduce stress and  improve awareness.  Instructor also leads participants through a meditation exercise.    Stretching for Flexibility and Mobility:  -Group instruction provided by verbal instruction, patient participation, and written materials to support subject.  Instructors lead participants through series of stretches that are designed to increase flexibility thus improving mobility.  These stretches are additional exercise for major muscle groups that are typically performed during regular warm up and cool down.   Hands Only CPR:  -Group verbal, video, and participation provides a basic overview of AHA guidelines for community CPR. Role-play of emergencies allow participants the opportunity to practice calling for help and chest compression technique with discussion of AED use.   Hypertension: -Group verbal and written instruction that provides a basic overview of hypertension including the most recent diagnostic guidelines, risk factor reduction with self-care instructions and medication management.    Nutrition I class: Heart Healthy Eating:  -Group instruction provided by PowerPoint slides, verbal discussion, and written materials to support subject matter. The instructor gives an explanation and review of the Therapeutic Lifestyle Changes diet recommendations, which includes a discussion on lipid goals, dietary fat, sodium, fiber, plant stanol/sterol esters, sugar, and the components of a well-balanced, healthy diet.   Nutrition II class: Lifestyle Skills:  -Group instruction provided by PowerPoint slides, verbal discussion, and written materials to support subject matter. The instructor gives an explanation and review of label reading, grocery shopping for heart health, heart healthy recipe modifications, and ways to make healthier choices when eating out.   Diabetes Question & Answer:  -Group instruction provided by PowerPoint slides, verbal discussion, and written materials to support subject  matter. The instructor gives an explanation and  review of diabetes co-morbidities, pre- and post-prandial blood glucose goals, pre-exercise blood glucose goals, signs, symptoms, and treatment of hypoglycemia and hyperglycemia, and foot care basics.   Diabetes Blitz:  -Group instruction provided by PowerPoint slides, verbal discussion, and written materials to support subject matter. The instructor gives an explanation and review of the physiology behind type 1 and type 2 diabetes, diabetes medications and rational behind using different medications, pre- and post-prandial blood glucose recommendations and Hemoglobin A1c goals, diabetes diet, and exercise including blood glucose guidelines for exercising safely.    Portion Distortion:  -Group instruction provided by PowerPoint slides, verbal discussion, written materials, and food models to support subject matter. The instructor gives an explanation of serving size versus portion size, changes in portions sizes over the last 20 years, and what consists of a serving from each food group.   Stress Management:  -Group instruction provided by verbal instruction, video, and written materials to support subject matter.  Instructors review role of stress in heart disease and how to cope with stress positively.     Exercising on Your Own:  -Group instruction provided by verbal instruction, power point, and written materials to support subject.  Instructors discuss benefits of exercise, components of exercise, frequency and intensity of exercise, and end points for exercise.  Also discuss use of nitroglycerin and activating EMS.  Review options of places to exercise outside of rehab.  Review guidelines for sex with heart disease.   Cardiac Drugs I:  -Group instruction provided by verbal instruction and written materials to support subject.  Instructor reviews cardiac drug classes: antiplatelets, anticoagulants, beta blockers, and statins.  Instructor  discusses reasons, side effects, and lifestyle considerations for each drug class.   Cardiac Drugs II:  -Group instruction provided by verbal instruction and written materials to support subject.  Instructor reviews cardiac drug classes: angiotensin converting enzyme inhibitors (ACE-I), angiotensin II receptor blockers (ARBs), nitrates, and calcium channel blockers.  Instructor discusses reasons, side effects, and lifestyle considerations for each drug class.   Anatomy and Physiology of the Circulatory System:  Group verbal and written instruction and models provide basic cardiac anatomy and physiology, with the coronary electrical and arterial systems. Review of: AMI, Angina, Valve disease, Heart Failure, Peripheral Artery Disease, Cardiac Arrhythmia, Pacemakers, and the ICD.   Other Education:  -Group or individual verbal, written, or video instructions that support the educational goals of the cardiac rehab program.   Knowledge Questionnaire Score:     Knowledge Questionnaire Score - 01/16/17 1030      Knowledge Questionnaire Score   Pre Score 12/24      Core Components/Risk Factors/Patient Goals at Admission:     Personal Goals and Risk Factors at Admission - 01/16/17 1127      Core Components/Risk Factors/Patient Goals on Admission    Weight Management Obesity   Intervention Obesity: Provide education and appropriate resources to help participant work on and attain dietary goals.;Weight Management/Obesity: Establish reasonable short term and long term weight goals.;Weight Management: Develop a combined nutrition and exercise program designed to reach desired caloric intake, while maintaining appropriate intake of nutrient and fiber, sodium and fats, and appropriate energy expenditure required for the weight goal.;Weight Management: Provide education and appropriate resources to help participant work on and attain dietary goals.   Expected Outcomes Short Term: Continue to assess  and modify interventions until short term weight is achieved;Long Term: Adherence to nutrition and physical activity/exercise program aimed toward attainment of established weight goal;Weight Loss: Understanding of general  recommendations for a balanced deficit meal plan, which promotes 1-2 lb weight loss per week and includes a negative energy balance of [phone] kcal/d;Understanding recommendations for meals to include 15-35% energy as protein, 25-35% energy from fat, 35-60% energy from carbohydrates, less than 200mg  of dietary cholesterol, 20-35 gm of total fiber daily;Understanding of distribution of calorie intake throughout the day with the consumption of 4-5 meals/snacks;Weight Maintenance: Understanding of the daily nutrition guidelines, which includes 25-35% calories from fat, 7% or less cal from saturated fats, less than 200mg  cholesterol, less than 1.5gm of sodium, & 5 or more servings of fruits and vegetables daily   Improve shortness of breath with ADL's Yes   Intervention Provide education, individualized exercise plan and daily activity instruction to help decrease symptoms of SOB with activities of daily living.   Expected Outcomes Short Term: Achieves a reduction of symptoms when performing activities of daily living.   Hypertension Yes   Intervention Provide education on lifestyle modifcations including regular physical activity/exercise, weight management, moderate sodium restriction and increased consumption of fresh fruit, vegetables, and low fat dairy, alcohol moderation, and smoking cessation.;Monitor prescription use compliance.   Expected Outcomes Short Term: Continued assessment and intervention until BP is < 140/51mm HG in hypertensive participants. < 130/31mm HG in hypertensive participants with diabetes, heart failure or chronic kidney disease.;Long Term: Maintenance of blood pressure at goal levels.   Lipids Yes   Intervention Provide education and support for participant on  nutrition & aerobic/resistive exercise along with prescribed medications to achieve LDL 70mg , HDL >40mg .   Expected Outcomes Short Term: Participant states understanding of desired cholesterol values and is compliant with medications prescribed. Participant is following exercise prescription and nutrition guidelines.;Long Term: Cholesterol controlled with medications as prescribed, with individualized exercise RX and with personalized nutrition plan. Value goals: LDL < 70mg , HDL > 40 mg.   Stress Yes   Intervention Offer individual and/or small group education and counseling on adjustment to heart disease, stress management and health-related lifestyle change. Teach and support self-help strategies.;Refer participants experiencing significant psychosocial distress to appropriate mental health specialists for further evaluation and treatment. When possible, include family members and significant others in education/counseling sessions.   Personal Goal Other Yes   Personal Goal get back to water aerobics at [facility], improve balance, strengthen back muscles and posture   Intervention Provide exercise programming to assist with building exercise tolerance and aerobic capacity.   Expected Outcomes Pt will build on aerobic capacity and get back to water aerobics.      Core Components/Risk Factors/Patient Goals Review:    Core Components/Risk Factors/Patient Goals at Discharge (Final Review):    ITP Comments:  Comments: Patient attended orientation from [phone] to review rules and guidelines for program. Completed 6 minute exercise test,  Intitial ITP, and exercise prescription.  VSS. Telemetry-sinus rhythm,   Asymptomatic.

## 2017-01-17 ENCOUNTER — Ambulatory Visit (HOSPITAL_COMMUNITY): Payer: Medicare Other

## 2017-01-17 ENCOUNTER — Encounter (HOSPITAL_COMMUNITY): Payer: Medicare Other

## 2017-01-17 NOTE — Progress Notes (Signed)
Cardiac rehab insurance verification information reviewed with pt at cardiac rehab orientation. Pt reports she is awaiting [place] medicaid card however has received letter of authorization.  Cardiac rehab eligibility criteria for medicaid recipients reviewed with pt.  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   Pt states, "I need this".  Pt reports she is eager to participate.   Pt verbalized understanding of patient financial responsibility for non covered services.  Pt given verbal information to discuss financial concerns with staff to make CR schedule accommodations accordingly.

## 2017-01-18 NOTE — Telephone Encounter (Signed)
This patient is non-compliant with treatment plans. She has also not been forthcoming with her prior medications and dosages. I do not feel that I can provide adequate care for her based on these reasons. I request to terminate this physician-patient relationship.

## 2017-01-19 ENCOUNTER — Ambulatory Visit (HOSPITAL_COMMUNITY): Payer: Medicare Other

## 2017-01-19 ENCOUNTER — Encounter (HOSPITAL_COMMUNITY): Payer: Medicare Other

## 2017-01-24 ENCOUNTER — Encounter (HOSPITAL_COMMUNITY): Payer: Self-pay

## 2017-01-24 ENCOUNTER — Encounter (HOSPITAL_COMMUNITY)
Admission: RE | Admit: 2017-01-24 | Discharge: 2017-01-24 | Disposition: A | Discharge status: Home / Self Care | Payer: Medicare Other | Source: Ambulatory Visit | Attending: Cardiology | Admitting: Cardiology

## 2017-01-24 ENCOUNTER — Ambulatory Visit (HOSPITAL_COMMUNITY): Payer: Medicare Other

## 2017-01-24 DIAGNOSIS — Z955 Presence of coronary angioplasty implant and graft: Secondary | ICD-10-CM

## 2017-01-24 DIAGNOSIS — I214 Non-ST elevation (NSTEMI) myocardial infarction: Secondary | ICD-10-CM

## 2017-01-24 NOTE — Progress Notes (Signed)
Daily Session Note  Patient Details  Name: Alisha [name] MRN: [id number] Date of Birth: 1951-05-03 Referring Provider:     CARDIAC REHAB PHASE II ORIENTATION from 01/16/2017 in [place]  Referring Provider  Marijo File MD      Encounter Date: 01/24/2017  Check In:     Session Check In - 01/24/17 0569      Check-In   Location [facility]   Staff Present Cleda Mccreedy, MS, Exercise Physiologist;Olinty Celesta Aver, MS, ACSM CEP, Exercise Physiologist;Maria Whitaker, RN, BSN;Amber Fair, MS, ACSM RCEP, Exercise Physiologist;Alleah Dearman, RN, BSN   Supervising physician immediately available to respond to emergencies [facility] immediately available   Physician(s) Dr. Broadus John    Medication changes reported     No   Fall or balance concerns reported    No   Tobacco Cessation No Change   Warm-up and Cool-down Performed as group-led instruction   Resistance Training Performed No   VAD Patient? No     Pain Assessment   Currently in Pain? No/denies      Capillary Blood Glucose: No results found for this or any previous visit (from the past 24 hour(s)).    History  Smoking Status  . Never Smoker  Smokeless Tobacco  . Never Used    Goals Met:  Exercise tolerated well  Goals Unmet:  Not Applicable Comments: Pt started cardiac rehab today.  Pt tolerated light exercise without difficulty. VSS, telemetry-sinus rhythm, asymptomatic.  Medication list was not  Reconciled. Pt verbally unsure of her medications and is uncertain about accuracy of her current regimen.  Pt will bring her medication bottles with her to the next scheduled appt.   Pt denies barriers to medicaiton compliance.  PSYCHOSOCIAL ASSESSMENT:  PHQ-1. Pt exhibits health related anxiety from physical deconditioning.  Pt has  supportive family.  She has recently moved from [place] to live with her children.  Pt uses motorized scooter and is hopeful to be able to  walk more so she is not always dependant on it.    Pt enjoys dancing, singing, cooking and sewing.  Pt goals for cardiac rehab are to be able to walk using rolling walker and use her motorized scooter less.  Pt would like to also increase strength, improve breathing and increase knee flexibility to be able to ride her bike.  Pt encouraged to actively participate in CR goup exercise and home exercise to increase ability to achieve these goals.   Pt oriented to exercise equipment and routine.   Pt reports she has sleep apnea with CPAP that needs maintenance work and settings.  Pt new to [facility] area and has not established care with home care provider.   referral will be made to Dr. Fransico Him for sleep apnea management.    Understanding verbalized.   Dr. Fransico Him is Medical Director for Cardiac Rehab at [facility].

## 2017-01-26 ENCOUNTER — Telehealth: Payer: Self-pay | Admitting: Family Medicine

## 2017-01-26 ENCOUNTER — Ambulatory Visit (HOSPITAL_COMMUNITY): Payer: Medicare Other

## 2017-01-26 ENCOUNTER — Encounter (HOSPITAL_COMMUNITY)
Admission: RE | Admit: 2017-01-26 | Discharge: 2017-01-26 | Disposition: A | Discharge status: Home / Self Care | Payer: Medicare Other | Source: Ambulatory Visit | Attending: Cardiology | Admitting: Cardiology

## 2017-01-26 DIAGNOSIS — I214 Non-ST elevation (NSTEMI) myocardial infarction: Secondary | ICD-10-CM

## 2017-01-26 DIAGNOSIS — Z955 Presence of coronary angioplasty implant and graft: Secondary | ICD-10-CM | POA: Insufficient documentation

## 2017-01-26 DIAGNOSIS — R1310 Dysphagia, unspecified: Secondary | ICD-10-CM

## 2017-01-26 DIAGNOSIS — G894 Chronic pain syndrome: Secondary | ICD-10-CM

## 2017-01-26 NOTE — Telephone Encounter (Signed)
**  Remind patient they can make refill requests via MyChart**  Medication refill request (Name & Dosage): sertraline (ZOLOFT) 25 MG tablet [[id number]]   mirabegron ER (MYRBETRIQ) 25 MG TB24 tablet [[id number]]  leflunomide (ARAVA) 20 MG tablet [[id number]]  esomeprazole (NEXIUM) 40 MG capsule [[id number]]   morphine (MS CONTIN) 15 MG 12 hr tablet [[id number]]   Preferred pharmacy (Name & Address): [id number] [address], [place]. [phone] (Phone) [phone] (Fax)       Other comments (if applicable):   Patient stated she knew morphine could not be called in, would have to pick script up.

## 2017-01-26 NOTE — Telephone Encounter (Signed)
Please advise 

## 2017-01-26 NOTE — Progress Notes (Signed)
Pt brought pill bottles for medication reconciliation.  Pt reports she had previously been out of amlodipine, metoprolol and lisinopril  due to her daughter accidentially forgetting to give her refills and granddaughter withholding lasix to decrease bathroom frequency.  Pt states she is out of morphine and needs written RX. Pt also out of nexium, arava, myrbetruqm sertraline and montelukast.  Pt contacted her PCP office via telephone to request these meds be refilled.  Pt assisted with cleaning out empty and duplicate pill bottles.  Bottles taken to pharmacy for disposal.  Pt given up to date medication list. Pt states she was previously missing metoprolol due forgetting to take in the mornings. Pt will change to suppertime dosing.  Pt states she currently uses a daily pill reminder that she fills every 3 weeks.  Pt instructed to take all medications as prescribed.  Understanding verbalized.

## 2017-01-29 ENCOUNTER — Ambulatory Visit (HOSPITAL_COMMUNITY): Payer: Medicare Other

## 2017-01-29 ENCOUNTER — Encounter (HOSPITAL_COMMUNITY)
Admission: RE | Admit: 2017-01-29 | Discharge: 2017-01-29 | Disposition: A | Discharge status: Home / Self Care | Payer: Medicare Other | Source: Ambulatory Visit | Attending: Cardiology | Admitting: Cardiology

## 2017-01-29 DIAGNOSIS — Z955 Presence of coronary angioplasty implant and graft: Secondary | ICD-10-CM

## 2017-01-29 DIAGNOSIS — I214 Non-ST elevation (NSTEMI) myocardial infarction: Secondary | ICD-10-CM

## 2017-01-29 MED ORDER — SERTRALINE HCL 25 MG PO TABS: 25.0000 mg | ORAL_TABLET | Freq: Every day | ORAL | 0 refills | Status: DC

## 2017-01-29 MED ORDER — MORPHINE SULFATE ER 15 MG PO TBCR: 15.0000 mg | EXTENDED_RELEASE_TABLET | Freq: Two times a day (BID) | ORAL | 0 refills | Status: DC

## 2017-01-29 MED ORDER — MIRABEGRON ER 25 MG PO TB24
25.0000 mg | ORAL_TABLET | Freq: Every day | ORAL | 0 refills | Status: DC
Start: 2017-01-29 — End: 2017-01-29

## 2017-01-29 MED ORDER — ESOMEPRAZOLE MAGNESIUM 40 MG PO CPDR: 40.0000 mg | DELAYED_RELEASE_CAPSULE | Freq: Every day | ORAL | 3 refills | Status: DC

## 2017-01-29 MED ORDER — MIRABEGRON ER 25 MG PO TB24
25.0000 mg | ORAL_TABLET | Freq: Every day | ORAL | 0 refills | Status: DC
Start: 2017-01-29 — End: 2017-02-25

## 2017-01-29 NOTE — Telephone Encounter (Signed)
Patient called to check on morphine rx for pick up today. I was advised that [facility]ef will have it ready by 3:30pm today for patient. Patient verbally expressed understanding.

## 2017-01-29 NOTE — Telephone Encounter (Signed)
RX refilled and MS Contin put up front for the patient to pick up.

## 2017-01-29 NOTE — Telephone Encounter (Signed)
Okay to refill Zoloft, Nexium, Myrbetriq. Patient already referred to [facility], so should be having [place] filled by them. Okay MS Contin 15 mg BUT take down to BID and give 30 day supply only.

## 2017-01-29 NOTE — Progress Notes (Signed)
After chart review, it appears pt PCP has determined necessity to discharge pt from her practice. Pt does not admit that she has been informed, although pt does report her request for medication refills on Friday has not been performed.  Pt would like to establish care with new PCP because she feels she is not able to adequately communicate her needs with current PCP.   Referral will be made on pt behalf to [facility].  LM with Susette Racer, RN for referral.

## 2017-01-31 ENCOUNTER — Ambulatory Visit (HOSPITAL_COMMUNITY): Payer: Medicare Other

## 2017-01-31 ENCOUNTER — Encounter (HOSPITAL_COMMUNITY)
Admission: RE | Admit: 2017-01-31 | Discharge: 2017-01-31 | Disposition: A | Discharge status: Home / Self Care | Payer: Medicare Other | Source: Ambulatory Visit | Attending: Cardiology | Admitting: Cardiology

## 2017-01-31 DIAGNOSIS — I214 Non-ST elevation (NSTEMI) myocardial infarction: Secondary | ICD-10-CM

## 2017-01-31 DIAGNOSIS — Z955 Presence of coronary angioplasty implant and graft: Secondary | ICD-10-CM

## 2017-02-02 ENCOUNTER — Ambulatory Visit (HOSPITAL_COMMUNITY): Payer: Medicare Other

## 2017-02-02 ENCOUNTER — Encounter (HOSPITAL_COMMUNITY): Admission: RE | Admit: 2017-02-02 | Payer: Medicare Other | Source: Ambulatory Visit

## 2017-02-05 ENCOUNTER — Ambulatory Visit (HOSPITAL_COMMUNITY): Payer: Medicare Other

## 2017-02-05 ENCOUNTER — Encounter (HOSPITAL_COMMUNITY): Payer: Medicare Other

## 2017-02-07 ENCOUNTER — Encounter (HOSPITAL_COMMUNITY)
Admission: RE | Admit: 2017-02-07 | Discharge: 2017-02-07 | Disposition: A | Discharge status: Home / Self Care | Payer: Medicare Other | Source: Ambulatory Visit | Attending: Cardiology | Admitting: Cardiology

## 2017-02-07 ENCOUNTER — Ambulatory Visit (HOSPITAL_COMMUNITY): Payer: Medicare Other

## 2017-02-07 DIAGNOSIS — Z955 Presence of coronary angioplasty implant and graft: Secondary | ICD-10-CM

## 2017-02-07 DIAGNOSIS — I214 Non-ST elevation (NSTEMI) myocardial infarction: Secondary | ICD-10-CM

## 2017-02-09 ENCOUNTER — Encounter (HOSPITAL_COMMUNITY): Payer: Medicare Other

## 2017-02-09 ENCOUNTER — Ambulatory Visit (HOSPITAL_COMMUNITY): Payer: Medicare Other

## 2017-02-12 ENCOUNTER — Encounter (HOSPITAL_COMMUNITY): Payer: Medicare Other

## 2017-02-12 ENCOUNTER — Ambulatory Visit (HOSPITAL_COMMUNITY): Payer: Medicare Other

## 2017-02-14 ENCOUNTER — Encounter (HOSPITAL_COMMUNITY)
Admission: RE | Admit: 2017-02-14 | Discharge: 2017-02-14 | Disposition: A | Discharge status: Home / Self Care | Payer: Medicare Other | Source: Ambulatory Visit | Attending: Cardiology | Admitting: Cardiology

## 2017-02-14 ENCOUNTER — Ambulatory Visit (HOSPITAL_COMMUNITY): Payer: Medicare Other

## 2017-02-14 DIAGNOSIS — Z955 Presence of coronary angioplasty implant and graft: Secondary | ICD-10-CM

## 2017-02-14 DIAGNOSIS — I214 Non-ST elevation (NSTEMI) myocardial infarction: Secondary | ICD-10-CM

## 2017-02-14 NOTE — Progress Notes (Signed)
Cardiac Individual Treatment Plan  Patient Details  Name: Alisha [name] MRN: [id number] Date of Birth: [birth year] Referring Provider:     CARDIAC REHAB PHASE II ORIENTATION from 01/16/2017 in [place]  Referring Provider  Marijo File MD      Initial Encounter Date:    CARDIAC REHAB PHASE II ORIENTATION from 01/16/2017 in [facility]  Date  01/16/17  Referring Provider  Marijo File MD      Visit Diagnosis: 05/2016 Status post coronary artery stent placement  05/2016 NSTEMI (non-ST elevated myocardial infarction) [facility])  Patient's Home Medications on Admission:  Current Outpatient Prescriptions:  .  albuterol (PROVENTIL HFA;VENTOLIN HFA) 108 (90 Base) MCG/ACT inhaler, Inhale 2 puffs into the lungs every 6 (six) hours as needed for wheezing or shortness of breath., Disp: 1 Inhaler, Rfl: 6 .  albuterol (PROVENTIL) (2.5 MG/3ML) 0.083% nebulizer solution, Take 3 mLs (2.5 mg total) by nebulization every 6 (six) hours as needed for wheezing or shortness of breath., Disp: 150 mL, Rfl: 1 .  amLODipine (NORVASC) 10 MG tablet, Take 1 tablet (10 mg total) by mouth daily., Disp: 90 tablet, Rfl: 1 .  aspirin EC 81 MG tablet, Take 1 tablet (81 mg total) by mouth daily., Disp: 90 tablet, Rfl: 1 .  atorvastatin (LIPITOR) 20 MG tablet, Take 1 tablet (20 mg total) by mouth daily., Disp: 90 tablet, Rfl: 1 .  budesonide-formoterol (SYMBICORT) 160-4.5 MCG/ACT inhaler, Inhale 2 puffs into the lungs 2 (two) times daily. (Patient not taking: Reported on 01/16/2017), Disp: 1 Inhaler, Rfl: 6 .  esomeprazole (NEXIUM) 40 MG capsule, Take 1 capsule (40 mg total) by mouth daily., Disp: 30 capsule, Rfl: 3 .  furosemide (LASIX) 20 MG tablet, Take 2 tablets (40 mg total) by mouth daily. (Patient taking differently: Take 20 mg by mouth 2 (two) times daily. ), Disp: 60 tablet, Rfl: 3 .  gabapentin (NEURONTIN) 600 MG tablet, Take 1 tablet (600 mg  total) by mouth 3 (three) times daily., Disp: 90 tablet, Rfl: 3 .  isosorbide dinitrate (ISORDIL) 30 MG tablet, Take 30 mg by mouth daily., Disp: , Rfl:  .  leflunomide (ARAVA) 20 MG tablet, Take 1 tablet (20 mg total) by mouth daily., Disp: 30 tablet, Rfl: 0 .  lisinopril (PRINIVIL,ZESTRIL) 40 MG tablet, Take 1 tablet (40 mg total) by mouth daily. (Patient not taking: Reported on 01/26/2017), Disp: 90 tablet, Rfl: 1 .  loratadine (CLARITIN) 10 MG tablet, Take 1 tablet (10 mg total) by mouth daily., Disp: 30 tablet, Rfl: 6 .  metoprolol succinate (TOPROL-XL) 50 MG 24 hr tablet, Take 1 tablet (50 mg total) by mouth daily. Take with or immediately following a meal. (Patient not taking: Reported on 01/26/2017), Disp: 90 tablet, Rfl: 1 .  mirabegron ER (MYRBETRIQ) 25 MG TB24 tablet, Take 1 tablet (25 mg total) by mouth daily., Disp: 30 tablet, Rfl: 0 .  morphine (MS CONTIN) 15 MG 12 hr tablet, Take 1 tablet (15 mg total) by mouth every 12 (twelve) hours., Disp: 60 tablet, Rfl: 0 .  nitroGLYCERIN (NITROSTAT) 0.4 MG SL tablet, Place 1 tablet (0.4 mg total) under the tongue every 5 (five) minutes as needed for chest pain., Disp: 25 tablet, Rfl: 3 .  oxyCODONE-acetaminophen (ROXICET) 5-325 MG tablet, Take 1 tablet by mouth every 8 (eight) hours as needed for severe pain., Disp: 90 tablet, Rfl: 0 .  rizatriptan (MAXALT) 10 MG tablet, Take 1 tablet for headache.  May repeat once in  2 hours if needed.  Do not exceed 2 tablets in 24 hours., Disp: 10 tablet, Rfl: 3 .  sertraline (ZOLOFT) 25 MG tablet, Take 1 tablet (25 mg total) by mouth daily., Disp: 30 tablet, Rfl: 0 .  ticagrelor (BRILINTA) 90 MG TABS tablet, Take 1 tablet (90 mg total) by mouth 2 (two) times daily., Disp: 180 tablet, Rfl: 1  Past Medical History: Past Medical History:  Diagnosis Date  . Arthritis   . Asthma   . CAD (coronary artery disease) 07/07/2016   a. s/p stenting x 2 in [place] to the LAD, RCA is chronically occluded, LVEDP was 34  .  Depression   . Fibromyalgia   . GERD (gastroesophageal reflux disease)   . History of benign pituitary tumor   . Hyperlipidemia   . Hypertension   . Migraine   . Morbid obesity ([place])   . Vertigo     Tobacco Use: History  Smoking Status  . Never Smoker  Smokeless Tobacco  . Never Used    Labs: Recent Review Flowsheet Data    Labs for ITP Cardiac and Pulmonary Rehab Latest Ref Rng & Units 11/15/2016   Cholestrol <200 mg/dL 172   LDLCALC <100 mg/dL 105(H)   HDL >50 mg/dL 37(L)   Trlycerides <150 mg/dL 149      Capillary Blood Glucose: No results found for: GLUCAP   Exercise Target Goals:    Exercise Program Goal: Individual exercise prescription set with THRR, safety & activity barriers. Participant demonstrates ability to understand and report RPE using BORG scale, to self-measure pulse accurately, and to acknowledge the importance of the exercise prescription.  Exercise Prescription Goal: Starting with aerobic activity 30 plus minutes a day, 3 days per week for initial exercise prescription. Provide home exercise prescription and guidelines that participant acknowledges understanding prior to discharge.  Activity Barriers & Risk Stratification:     Activity Barriers & Cardiac Risk Stratification - 11/02/16 1146      Activity Barriers & Cardiac Risk Stratification   Activity Barriers Left Knee Replacement;Right Knee Replacement;Neck/Spine Problems;Back Problems;Arthritis;Joint Problems;Deconditioning;Muscular Weakness;Shortness of Breath;Balance Concerns;History of Falls;Assistive Device;Other (comment)   Comments B ankle fusion, L wrist surgery, B carpal tunnel surgery, L wrist cyst removal   Cardiac Risk Stratification High      6 Minute Walk:     6 Minute Walk    Row Name 11/02/16 1135 01/16/17 1010 01/16/17 1017     6 Minute Walk   Phase Initial  -  -   Distance 0.45 feet  step test  .45km 0.48 feet  km not feet... pt did a step test  -   Walk Time 6  minutes 6 minutes  -   # of Rest Breaks 0 0  -   MPH 0.27  -  -   METS 2 2  -   RPE 12 13  -   VO2 Peak 7  -  -   Symptoms No No Yes (comment)   Comments  -  - R knee discomfort   Resting HR 62 bpm 57 bpm  -   Resting BP 110/72 120/70  -   Max Ex. HR 90 bpm 74 bpm  -   Max Ex. BP 126/70 142/60  -   2 Minute Post BP 110/74  -  -      Oxygen Initial Assessment:   Oxygen Re-Evaluation:   Oxygen Discharge (Final Oxygen Re-Evaluation):   Initial Exercise Prescription:     Initial Exercise Prescription -  01/16/17 1000      Date of Initial Exercise RX and Referring Provider   Date 01/16/17   Referring Provider Marijo File MD     NuStep   Level 3   SPM 80   Minutes 20   METs 1.8     Arm Ergometer   Level 1   Minutes 10   METs 1.3     Prescription Details   Frequency (times per week) 3   Duration Progress to 30 minutes of continuous aerobic without signs/symptoms of physical distress     Intensity   THRR 40-80% of Max Heartrate 61-122   Ratings of Perceived Exertion 11-13   Perceived Dyspnea 0-4     Progression   Progression Continue to progress workloads to maintain intensity without signs/symptoms of physical distress.     Resistance Training   Training Prescription Yes   Weight 2lbs   Reps 10-15      Perform Capillary Blood Glucose checks as needed.  Exercise Prescription Changes:     Exercise Prescription Changes    Row Name 01/30/17 1100             Response to Exercise   Blood Pressure (Admit) 156/70       Blood Pressure (Exercise) 182/70       Blood Pressure (Exit) 150/70       Heart Rate (Admit) 67 bpm       Heart Rate (Exercise) 95 bpm       Heart Rate (Exit) 64 bpm       Rating of Perceived Exertion (Exercise) 12       Symptoms none       Comments Pt is tolerating exercise very well       Duration Continue with 30 min of aerobic exercise without signs/symptoms of physical distress.       Intensity THRR unchanged          Progression   Progression Continue to progress workloads to maintain intensity without signs/symptoms of physical distress.       Average METs 1.9         Resistance Training   Training Prescription Yes       Reps 10-15       Time 10 Minutes         NuStep   Level 3       SPM 90       Minutes 20       METs 2.2         Arm Ergometer   Level 1       Minutes 10       METs 1.6          Exercise Comments:     Exercise Comments    Row Name 02/13/17 1716           Exercise Comments Reviewed METs and goals. Pt is tolerating exercise fairly well; will continue to monitor activty levels and exercise progression.          Exercise Goals and Review:     Exercise Goals    Row Name 11/02/16 1148 01/16/17 0911 01/16/17 0912         Exercise Goals   Increase Physical Activity Yes  -  -     Intervention Provide advice, education, support and counseling about physical activity/exercise needs.;Develop an individualized exercise prescription for aerobic and resistive training based on initial evaluation findings, risk stratification, comorbidities and participant's personal goals. Provide advice, education, support  and counseling about physical activity/exercise needs.;Develop an individualized exercise prescription for aerobic and resistive training based on initial evaluation findings, risk stratification, comorbidities and participant's personal goals.  -     Expected Outcomes Achievement of increased cardiorespiratory fitness and enhanced flexibility, muscular endurance and strength shown through measurements of functional capacity and personal statement of participant. Achievement of increased cardiorespiratory fitness and enhanced flexibility, muscular endurance and strength shown through measurements of functional capacity and personal statement of participant.  -     Increase Strength and Stamina Yes Yes -  get back to water aerobics at [facility], improve balance, strengthen back  muscles and posture     Intervention Develop an individualized exercise prescription for aerobic and resistive training based on initial evaluation findings, risk stratification, comorbidities and participant's personal goals.;Provide advice, education, support and counseling about physical activity/exercise needs. Develop an individualized exercise prescription for aerobic and resistive training based on initial evaluation findings, risk stratification, comorbidities and participant's personal goals.;Provide advice, education, support and counseling about physical activity/exercise needs.  -     Expected Outcomes Achievement of increased cardiorespiratory fitness and enhanced flexibility, muscular endurance and strength shown through measurements of functional capacity and personal statement of participant. Achievement of increased cardiorespiratory fitness and enhanced flexibility, muscular endurance and strength shown through measurements of functional capacity and personal statement of participant. -  Be able to improve walking tolerance without having to use scooter        Exercise Goals Re-Evaluation :     Exercise Goals Re-Evaluation    Row Name 02/13/17 1715             Exercise Goal Re-Evaluation   Exercise Goals Review Increase Physical Activity;Increase Strenth and Stamina       Comments Pt is doing well with ambulating and be more active in cardiac rehab. Pt overall MET avg has increased from 2.1 to 2.4       Expected Outcomes Pt will continue to improve in cardiorespiratory fitness           Discharge Exercise Prescription (Final Exercise Prescription Changes):     Exercise Prescription Changes - 01/30/17 1100      Response to Exercise   Blood Pressure (Admit) 156/70   Blood Pressure (Exercise) 182/70   Blood Pressure (Exit) 150/70   Heart Rate (Admit) 67 bpm   Heart Rate (Exercise) 95 bpm   Heart Rate (Exit) 64 bpm   Rating of Perceived Exertion (Exercise) 12    Symptoms none   Comments Pt is tolerating exercise very well   Duration Continue with 30 min of aerobic exercise without signs/symptoms of physical distress.   Intensity THRR unchanged     Progression   Progression Continue to progress workloads to maintain intensity without signs/symptoms of physical distress.   Average METs 1.9     Resistance Training   Training Prescription Yes   Reps 10-15   Time 10 Minutes     NuStep   Level 3   SPM 90   Minutes 20   METs 2.2     Arm Ergometer   Level 1   Minutes 10   METs 1.6      Nutrition:  Target Goals: Understanding of nutrition guidelines, daily intake of sodium <1573m, cholesterol <207m calories 30% from fat and 7% or less from saturated fats, daily to have 5 or more servings of fruits and vegetables.  Biometrics:     Pre Biometrics - 01/16/17 1021      Pre  Biometrics   % Body Fat 57.1 %       Nutrition Therapy Plan and Nutrition Goals:     Nutrition Therapy & Goals - 11/07/16 1514      Nutrition Therapy   Diet Therapeutic Lifestyle Changes     Personal Nutrition Goals   Nutrition Goal 1-2 lb wt loss per week to a wt loss goal of 6-24 lb at graduation from [place], educate and counsel regarding individualized specific dietary modifications aiming towards targeted core components such as weight, hypertension, lipid management, diabetes, heart failure and other comorbidities.   Expected Outcomes Short Term Goal: Understand basic principles of dietary content, such as calories, fat, sodium, cholesterol and nutrients.;Long Term Goal: Adherence to prescribed nutrition plan.      Nutrition Discharge: Nutrition Scores:     Nutrition Assessments - 11/07/16 1513      MEDFICTS Scores   Pre Score 24      Nutrition Goals Re-Evaluation:   Nutrition Goals Re-Evaluation:   Nutrition Goals Discharge (Final Nutrition Goals  Re-Evaluation):   Psychosocial: Target Goals: Acknowledge presence or absence of significant depression and/or stress, maximize coping skills, provide positive support system. Participant is able to verbalize types and ability to use techniques and skills needed for reducing stress and depression.  Initial Review & Psychosocial Screening:     Initial Psych Review & Screening - 01/16/17 1131      Initial Review   Current issues with Current Stress Concerns;Current Anxiety/Panic   Source of Stress Concerns Chronic Illness;Unable to participate in former interests or hobbies;Unable to perform yard/household activities;Financial;Transportation;Occupation   Comments pt with worsening deconditioning due to chronic illness notes health related anxiety is increasing.  pt eager to participate in CR activities to improve physical condition.      Family Dynamics   Good Support System? Yes  pt lives with her daughter   Comments Pt recently moved from [place] to [place] and is residing with her youngest daughter and grandchild.     Barriers   Psychosocial barriers to participate in program Psychosocial barriers identified (see note)      Quality of Life Scores:     Quality of Life - 01/16/17 1025      Quality of Life Scores   Health/Function Pre 10.75 %   Socioeconomic Pre 14.5 %   Psych/Spiritual Pre 15.33 %   Family Pre 12.63 %   GLOBAL Pre 12.96 %      PHQ-9: Recent Review Flowsheet Data    Depression screen [facility] 2/9 01/24/2017 11/20/2016 09/25/2016   Decreased Interest 0 1 0   Down, Depressed, Hopeless _0 PHQ - 2 Score _1 Altered sleeping - 3 -   Tired, decreased energy - 3 -   Change in appetite - 0 -   Feeling bad or failure about yourself  - 2 -   Trouble concentrating - 2 -   Moving slowly or fidgety/restless - 0 -   Suicidal thoughts - 0 -   PHQ-9 Score - 14 -   Difficult doing work/chores - Very difficult -     Interpretation of Total Score  Total  Score Depression Severity:  1-4 = Minimal depression, 5-9 = Mild depression, 10-14 = Moderate depression, 15-19 = Moderately severe depression, 20-27 = Severe depression   Psychosocial Evaluation and Intervention:     Psychosocial Evaluation - 02/14/17 1740  Psychosocial Evaluation & Interventions   Comments pt with health related anxiety/depression from physical deconditioning.  pt has recently moved from [place] to live with her daughter.  pt exhibits some degree of decreased anxiety as she regains more independence in her care.     Expected Outcomes pt will exhibit positive outlook with good coping skills.    Continue Psychosocial Services  Follow up required by staff      Psychosocial Re-Evaluation:   Psychosocial Discharge (Final Psychosocial Re-Evaluation):   Vocational Rehabilitation: Provide vocational rehab assistance to qualifying candidates.   Vocational Rehab Evaluation & Intervention:     Vocational Rehab - 01/16/17 1122      Initial Vocational Rehab Evaluation & Intervention   Assessment shows need for Vocational Rehabilitation Yes      Education: Education Goals: Education classes will be provided on a weekly basis, covering required topics. Participant will state understanding/return demonstration of topics presented.  Learning Barriers/Preferences:     Learning Barriers/Preferences - 01/16/17 0830      Learning Barriers/Preferences   Learning Barriers Sight   Learning Preferences Audio;Written Material;Verbal Instruction;Video      Education Topics: Count Your Pulse:  -Group instruction provided by verbal instruction, demonstration, patient participation and written materials to support subject.  Instructors address importance of being able to find your pulse and how to count your pulse when at home without a heart monitor.  Patients get hands on experience counting their pulse with staff help and individually.   CARDIAC REHAB PHASE II EXERCISE  from 02/14/2017 in [place]  Date  01/26/17  Instruction Review Code  2- meets goals/outcomes      Heart Attack, Angina, and Risk Factor Modification:  -Group instruction provided by verbal instruction, video, and written materials to support subject.  Instructors address signs and symptoms of angina and heart attacks.    Also discuss risk factors for heart disease and how to make changes to improve heart health risk factors.   Functional Fitness:  -Group instruction provided by verbal instruction, demonstration, patient participation, and written materials to support subject.  Instructors address safety measures for doing things around the house.  Discuss how to get up and down off the floor, how to pick things up properly, how to safely get out of a chair without assistance, and balance training.   CARDIAC REHAB PHASE II EXERCISE from 02/14/2017 in [place]  Date  11/10/16  Educator  [place]  Instruction Review Code  2- meets goals/outcomes      Meditation and Mindfulness:  -Group instruction provided by verbal instruction, patient participation, and written materials to support subject.  Instructor addresses importance of mindfulness and meditation practice to help reduce stress and improve awareness.  Instructor also leads participants through a meditation exercise.    CARDIAC REHAB PHASE II EXERCISE from 02/14/2017 in [place]  Date  01/31/17  Instruction Review Code  2- meets goals/outcomes      Stretching for Flexibility and Mobility:  -Group instruction provided by verbal instruction, patient participation, and written materials to support subject.  Instructors lead participants through series of stretches that are designed to increase flexibility thus improving mobility.  These stretches are additional exercise for major muscle groups that are typically performed  during regular warm up and cool down.   Hands Only CPR:  -Group verbal, video, and participation provides a basic overview of AHA guidelines for community CPR. Role-play of  emergencies allow participants the opportunity to practice calling for help and chest compression technique with discussion of AED use.   Hypertension: -Group verbal and written instruction that provides a basic overview of hypertension including the most recent diagnostic guidelines, risk factor reduction with self-care instructions and medication management.    Nutrition I class: Heart Healthy Eating:  -Group instruction provided by PowerPoint slides, verbal discussion, and written materials to support subject matter. The instructor gives an explanation and review of the Therapeutic Lifestyle Changes diet recommendations, which includes a discussion on lipid goals, dietary fat, sodium, fiber, plant stanol/sterol esters, sugar, and the components of a well-balanced, healthy diet.   Nutrition II class: Lifestyle Skills:  -Group instruction provided by PowerPoint slides, verbal discussion, and written materials to support subject matter. The instructor gives an explanation and review of label reading, grocery shopping for heart health, heart healthy recipe modifications, and ways to make healthier choices when eating out.   Diabetes Question & Answer:  -Group instruction provided by PowerPoint slides, verbal discussion, and written materials to support subject matter. The instructor gives an explanation and review of diabetes co-morbidities, pre- and post-prandial blood glucose goals, pre-exercise blood glucose goals, signs, symptoms, and treatment of hypoglycemia and hyperglycemia, and foot care basics.   Diabetes Blitz:  -Group instruction provided by PowerPoint slides, verbal discussion, and written materials to support subject matter. The instructor gives an explanation and review of the physiology behind type 1 and  type 2 diabetes, diabetes medications and rational behind using different medications, pre- and post-prandial blood glucose recommendations and Hemoglobin A1c goals, diabetes diet, and exercise including blood glucose guidelines for exercising safely.    Portion Distortion:  -Group instruction provided by PowerPoint slides, verbal discussion, written materials, and food models to support subject matter. The instructor gives an explanation of serving size versus portion size, changes in portions sizes over the last 20 years, and what consists of a serving from each food group.   CARDIAC REHAB PHASE II EXERCISE from 02/14/2017 in [place]  Date  02/14/17  Educator  [place]  Instruction Review Code  2- meets goals/outcomes      Stress Management:  -Group instruction provided by verbal instruction, video, and written materials to support subject matter.  Instructors review role of stress in heart disease and how to cope with stress positively.     Exercising on Your Own:  -Group instruction provided by verbal instruction, power point, and written materials to support subject.  Instructors discuss benefits of exercise, components of exercise, frequency and intensity of exercise, and end points for exercise.  Also discuss use of nitroglycerin and activating EMS.  Review options of places to exercise outside of rehab.  Review guidelines for sex with heart disease.   Cardiac Drugs I:  -Group instruction provided by verbal instruction and written materials to support subject.  Instructor reviews cardiac drug classes: antiplatelets, anticoagulants, beta blockers, and statins.  Instructor discusses reasons, side effects, and lifestyle considerations for each drug class.   Cardiac Drugs II:  -Group instruction provided by verbal instruction and written materials to support subject.  Instructor reviews cardiac drug classes: angiotensin converting enzyme inhibitors (ACE-I),  angiotensin II receptor blockers (ARBs), nitrates, and calcium channel blockers.  Instructor discusses reasons, side effects, and lifestyle considerations for each drug class.   CARDIAC REHAB PHASE II EXERCISE from 02/14/2017 in [place]  Date  02/07/17  Educator  Kennyth Lose  Instruction Review Code  2- meets goals/outcomes      Anatomy and Physiology of the Circulatory System:  Group verbal and written instruction and models provide basic cardiac anatomy and physiology, with the coronary electrical and arterial systems. Review of: AMI, Angina, Valve disease, Heart Failure, Peripheral Artery Disease, Cardiac Arrhythmia, Pacemakers, and the ICD.   CARDIAC REHAB PHASE II EXERCISE from 02/14/2017 in [place]  Date  01/24/17  Instruction Review Code  2- meets goals/outcomes      Other Education:  -Group or individual verbal, written, or video instructions that support the educational goals of the cardiac rehab program.   Knowledge Questionnaire Score:     Knowledge Questionnaire Score - 01/16/17 1030      Knowledge Questionnaire Score   Pre Score 12/24      Core Components/Risk Factors/Patient Goals at Admission:     Personal Goals and Risk Factors at Admission - 01/16/17 1127      Core Components/Risk Factors/Patient Goals on Admission    Weight Management Obesity   Intervention Obesity: Provide education and appropriate resources to help participant work on and attain dietary goals.;Weight Management/Obesity: Establish reasonable short term and long term weight goals.;Weight Management: Develop a combined nutrition and exercise program designed to reach desired caloric intake, while maintaining appropriate intake of nutrient and fiber, sodium and fats, and appropriate energy expenditure required for the weight goal.;Weight Management: Provide education and appropriate resources to help participant work on and attain  dietary goals.   Expected Outcomes Short Term: Continue to assess and modify interventions until short term weight is achieved;Long Term: Adherence to nutrition and physical activity/exercise program aimed toward attainment of established weight goal;Weight Loss: Understanding of general recommendations for a balanced deficit meal plan, which promotes 1-2 lb weight loss per week and includes a negative energy balance of [phone] kcal/d;Understanding recommendations for meals to include 15-35% energy as protein, 25-35% energy from fat, 35-60% energy from carbohydrates, less than '200mg'$  of dietary cholesterol, 20-35 gm of total fiber daily;Understanding of distribution of calorie intake throughout the day with the consumption of 4-5 meals/snacks;Weight Maintenance: Understanding of the daily nutrition guidelines, which includes 25-35% calories from fat, 7% or less cal from saturated fats, less than '200mg'$  cholesterol, less than 1.5gm of sodium, & 5 or more servings of fruits and vegetables daily   Improve shortness of breath with ADL's Yes   Intervention Provide education, individualized exercise plan and daily activity instruction to help decrease symptoms of SOB with activities of daily living.   Expected Outcomes Short Term: Achieves a reduction of symptoms when performing activities of daily living.   Hypertension Yes   Intervention Provide education on lifestyle modifcations including regular physical activity/exercise, weight management, moderate sodium restriction and increased consumption of fresh fruit, vegetables, and low fat dairy, alcohol moderation, and smoking cessation.;Monitor prescription use compliance.   Expected Outcomes Short Term: Continued assessment and intervention until BP is < 140/45m HG in hypertensive participants. < 130/860mHG in hypertensive participants with diabetes, heart failure or chronic kidney disease.;Long Term: Maintenance of blood pressure at goal levels.   Lipids Yes    Intervention Provide education and support for participant on nutrition & aerobic/resistive exercise along with prescribed medications to achieve LDL '70mg'$ , HDL >'40mg'$ .   Expected Outcomes Short Term: Participant states understanding of desired cholesterol values and is compliant with medications prescribed. Participant is following exercise prescription and nutrition guidelines.;Long Term: Cholesterol controlled with medications as prescribed, with individualized exercise RX and with personalized nutrition plan.  Value goals: LDL < 55m, HDL > 40 mg.   Stress Yes   Intervention Offer individual and/or small group education and counseling on adjustment to heart disease, stress management and health-related lifestyle change. Teach and support self-help strategies.;Refer participants experiencing significant psychosocial distress to appropriate mental health specialists for further evaluation and treatment. When possible, include family members and significant others in education/counseling sessions.   Personal Goal Other Yes   Personal Goal get back to water aerobics at [facility] improve balance, strengthen back muscles and posture   Intervention Provide exercise programming to assist with building exercise tolerance and aerobic capacity.   Expected Outcomes Pt will build on aerobic capacity and get back to water aerobics.      Core Components/Risk Factors/Patient Goals Review:      Goals and Risk Factor Review    Row Name 02/14/17 1737             Core Components/Risk Factors/Patient Goals Review   Personal Goals Review Weight Management/Obesity;Improve shortness of breath with ADL's;Hypertension;Lipids;Stress;Other       Review pt with multiple CAD risk factors displays eagerness to participate in CR program, however pt does have frequent absences.  pt is also eager to establish care with new PCP, sleep apnea specialist and pulmonlogist.  referrrals have been placed.        Expected Outcomes pt  will continue participation in CR exercise, nutrition and lifestyle education offerings to decrease overall risk factors.           Core Components/Risk Factors/Patient Goals at Discharge (Final Review):      Goals and Risk Factor Review - 02/14/17 1737      Core Components/Risk Factors/Patient Goals Review   Personal Goals Review Weight Management/Obesity;Improve shortness of breath with ADL's;Hypertension;Lipids;Stress;Other   Review pt with multiple CAD risk factors displays eagerness to participate in CR program, however pt does have frequent absences.  pt is also eager to establish care with new PCP, sleep apnea specialist and pulmonlogist.  referrrals have been placed.    Expected Outcomes pt will continue participation in CR exercise, nutrition and lifestyle education offerings to decrease overall risk factors.       ITP Comments:     ITP Comments    Row Name 11/02/16 1127 01/16/17 [id number]       ITP Comments Dr. TFransico Him Medical Director Dr. TFransico Him Medical Director  Dr. TFransico Him Medical Director         Comments: Pt is making expected progress toward personal goals after completing 7 sessions. Recommend continued exercise and life style modification education including  stress management and relaxation techniques to decrease cardiac risk profile.

## 2017-02-16 ENCOUNTER — Ambulatory Visit (HOSPITAL_COMMUNITY): Payer: Medicare Other

## 2017-02-16 ENCOUNTER — Encounter (HOSPITAL_COMMUNITY)
Admission: RE | Admit: 2017-02-16 | Discharge: 2017-02-16 | Disposition: A | Discharge status: Home / Self Care | Payer: Medicare Other | Source: Ambulatory Visit | Attending: Cardiology | Admitting: Cardiology

## 2017-02-16 DIAGNOSIS — Z955 Presence of coronary angioplasty implant and graft: Secondary | ICD-10-CM | POA: Diagnosis not present

## 2017-02-16 DIAGNOSIS — I214 Non-ST elevation (NSTEMI) myocardial infarction: Secondary | ICD-10-CM

## 2017-02-16 NOTE — Progress Notes (Signed)
Reviewed home exercise with pt today.  Pt plans to walk for 10'( 1-2 bouts per day) and do leg exercises for balance and stability.  Reviewed THR, pulse, RPE, sign and symptoms, NTG use, and when to call 911 or MD.  Also discussed weather considerations and indoor options.  Pt voiced understanding.   Alisha [name] [employer]

## 2017-02-18 ENCOUNTER — Other Ambulatory Visit: Payer: Self-pay | Admitting: Nurse Practitioner

## 2017-02-19 ENCOUNTER — Emergency Department (HOSPITAL_COMMUNITY)
Admission: EM | Admit: 2017-02-19 | Discharge: 2017-02-19 | Disposition: A | Discharge status: Home / Self Care | Payer: Medicare Other | Attending: Emergency Medicine | Admitting: Emergency Medicine

## 2017-02-19 ENCOUNTER — Encounter (HOSPITAL_COMMUNITY): Payer: Self-pay | Admitting: Emergency Medicine

## 2017-02-19 ENCOUNTER — Telehealth (HOSPITAL_COMMUNITY): Payer: Self-pay | Admitting: Cardiac Rehabilitation

## 2017-02-19 ENCOUNTER — Ambulatory Visit (HOSPITAL_COMMUNITY): Payer: Medicare Other

## 2017-02-19 ENCOUNTER — Encounter (HOSPITAL_COMMUNITY)
Admission: RE | Admit: 2017-02-19 | Discharge: 2017-02-19 | Disposition: A | Discharge status: Home / Self Care | Payer: Medicare Other | Source: Ambulatory Visit | Attending: Cardiology | Admitting: Cardiology

## 2017-02-19 DIAGNOSIS — Z955 Presence of coronary angioplasty implant and graft: Secondary | ICD-10-CM

## 2017-02-19 DIAGNOSIS — I2510 Atherosclerotic heart disease of native coronary artery without angina pectoris: Secondary | ICD-10-CM | POA: Insufficient documentation

## 2017-02-19 DIAGNOSIS — J385 Laryngeal spasm: Secondary | ICD-10-CM | POA: Insufficient documentation

## 2017-02-19 DIAGNOSIS — R0982 Postnasal drip: Secondary | ICD-10-CM | POA: Insufficient documentation

## 2017-02-19 DIAGNOSIS — I10 Essential (primary) hypertension: Secondary | ICD-10-CM | POA: Diagnosis not present

## 2017-02-19 DIAGNOSIS — Z7982 Long term (current) use of aspirin: Secondary | ICD-10-CM | POA: Insufficient documentation

## 2017-02-19 DIAGNOSIS — R0602 Shortness of breath: Secondary | ICD-10-CM | POA: Diagnosis present

## 2017-02-19 DIAGNOSIS — Z79899 Other long term (current) drug therapy: Secondary | ICD-10-CM | POA: Insufficient documentation

## 2017-02-19 DIAGNOSIS — I214 Non-ST elevation (NSTEMI) myocardial infarction: Secondary | ICD-10-CM

## 2017-02-19 DIAGNOSIS — J45909 Unspecified asthma, uncomplicated: Secondary | ICD-10-CM | POA: Insufficient documentation

## 2017-02-19 MED ORDER — LIDOCAINE HCL (PF) 4 % IJ SOLN
4.0000 mL | Freq: Once | INTRAMUSCULAR | Status: AC
  Administered 2017-02-19: 2.5 mL
  Filled 2017-02-19: qty 5

## 2017-02-19 MED ORDER — GUAIFENESIN ER 600 MG PO TB12
600.0000 mg | ORAL_TABLET | Freq: Two times a day (BID) | ORAL | 1 refills | Status: DC
Start: 2017-02-19 — End: 2017-09-06

## 2017-02-19 NOTE — ED Notes (Signed)
Pt states I feel "100% better" able to clear secretions.

## 2017-02-19 NOTE — ED Notes (Signed)
Respiratory called to evaluate.

## 2017-02-19 NOTE — Progress Notes (Signed)
Incomplete Session Note  Patient Details  Name: Alisha [name] MRN: [id number] Date of Birth: Jan 25, 1951 Referring Provider:     CARDIAC REHAB PHASE II ORIENTATION from 01/16/2017 in [place]  Referring Provider  Marijo File MD      [name] did not complete her rehab session.  Pt c/o dyspnea with wheezing after walking track. Pt states symptoms began on Saturday and progressively worsening each day. Sinus rhythm, HR-83.  BP:  136/82.  O2 sat-99% ra.    O2-4L via [place] applied.  Pt transported to ED via stretcher in stable condition. Pt took her purse and contents with her. Pt motorized scooter left in department.  Will be transported by facilities.  Pt daughter notified of transfer.

## 2017-02-19 NOTE — ED Notes (Signed)
Call [facility] dispatch when patient is to be discharged.

## 2017-02-19 NOTE — ED Notes (Addendum)
RN attempted to start IVs x 2; no success.

## 2017-02-19 NOTE — ED Notes (Addendum)
Pt states no longer feels as if her throat is closing.

## 2017-02-19 NOTE — ED Provider Notes (Signed)
[facility] DEPT Provider Note   CSN: [id number] Arrival date & time: 02/19/17  4854     History   Chief Complaint Chief Complaint  Patient presents with  . Shortness of Breath    HPI Alisha [name] is a 66 y.o. female.  HPI Patient was at cardiac rehabilitation when she started having difficulty breathing and swallowing. She is referred to the emergency department. The patient reports she's had this happen multiple times over the course of the last several months. She states her mouth gets to feeling very dry and it feels like her secretions are thick. She reports it starts to feel like her throat is closing off. Sometimes it makes her go to coughing episodes. She's also been noting that lately there is certain foods at times it will cause her to feel like she can't swallow. Symptoms may come on acutely, last for a while then resolve spontaneously. Patient reports she was supposed to follow-up with ENT but her PCP has not made a referral for her at this point. She reports symptoms were improved when she was on Mucinex. She reports oftentimes it feels like there is thickened secretions in the back of her throat that is hard for her to get out. No chest pain, no cough, no fever, no dyspnea. No lower extremity swelling or calf pain. Past Medical History:  Diagnosis Date  . Arthritis   . Asthma   . CAD (coronary artery disease) 07/07/2016   a. s/p stenting x 2 in [place] to the LAD, RCA is chronically occluded, LVEDP was 34  . Depression   . Fibromyalgia   . GERD (gastroesophageal reflux disease)   . History of benign pituitary tumor   . Hyperlipidemia   . Hypertension   . Migraine   . Morbid obesity ([place])   . Vertigo     Patient Active Problem List   Diagnosis Date Noted  . Coronary artery disease involving native coronary artery of native heart without angina pectoris 01/11/2017  . Hyperlipidemia 01/11/2017  . Polypharmacy 11/25/2016  . Venous stasis of both lower  extremities 10/04/2016  . Poor dentition 10/04/2016  . OSA (obstructive sleep apnea) 10/04/2016  . History of nephrolithiasis 10/04/2016  . Hx of pilonidal cyst 10/04/2016  . Bipolar depression ([place]) 10/04/2016  . Personal history of DVT (deep vein thrombosis) 10/04/2016  . Morbid obesity ([place]) 09/25/2016  . 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 Essential hypertension 09/25/2016  . 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 Vision impairment 09/25/2016  . Dysthymia 09/25/2016  . Chronic seasonal allergic rhinitis due to pollen 09/25/2016  . Moderate persistent asthma without complication 62/70/3500  . Gastroesophageal reflux disease without esophagitis 09/25/2016  . OAB (overactive bladder) 09/25/2016  . Chronic pain syndrome 09/25/2016    Past Surgical History:  Procedure Laterality Date  . carpel tunnel release Bilateral 2017  . EXCISION / CURETTAGE BONE CYST PHALANGES OF FOOT  2014   Removal of foot cyst   . PARTIAL HYSTERECTOMY  unknown   Patient still has ovaries  . pituitary tumor removal  1999  . TONSILLECTOMY    . TOTAL KNEE ARTHROPLASTY     TKR X 3    OB History    No data available       Home Medications    Prior to Admission medications   Medication Sig Start Date End Date Taking? Authorizing Provider  albuterol (PROVENTIL HFA;VENTOLIN HFA) 108 (90 Base) MCG/ACT inhaler Inhale 2 puffs into  the lungs every 6 (six) hours as needed for wheezing or shortness of breath. 10/06/16  Yes Briscoe Deutscher, DO  albuterol (PROVENTIL) (2.5 MG/3ML) 0.083% nebulizer solution Take 3 mLs (2.5 mg total) by nebulization every 6 (six) hours as needed for wheezing or shortness of breath. 11/10/16  Yes Briscoe Deutscher, DO  amLODipine (NORVASC) 10 MG tablet Take 1 tablet (10 mg total) by mouth daily. 10/23/16  Yes Minus Breeding, MD  aspirin EC 81 MG tablet Take 1 tablet (81 mg total) by mouth daily. 10/23/16  Yes Minus Breeding, MD  atorvastatin  (LIPITOR) 20 MG tablet Take 1 tablet (20 mg total) by mouth daily. 11/22/16 02/20/17 Yes Barrett, Evelene Croon, PA-C  budesonide-formoterol (SYMBICORT) 160-4.5 MCG/ACT inhaler Inhale 2 puffs into the lungs 2 (two) times daily. 10/06/16  Yes Briscoe Deutscher, DO  esomeprazole (NEXIUM) 40 MG capsule Take 1 capsule (40 mg total) by mouth daily. 01/29/17  Yes Briscoe Deutscher, DO  gabapentin (NEURONTIN) 600 MG tablet Take 1 tablet (600 mg total) by mouth 3 (three) times daily. 10/06/16  Yes Briscoe Deutscher, DO  isosorbide dinitrate (ISORDIL) 30 MG tablet Take 30 mg by mouth daily.   Yes [provider]  leflunomide (ARAVA) 20 MG tablet Take 1 tablet (20 mg total) by mouth daily. 10/06/16  Yes Briscoe Deutscher, DO  lisinopril (PRINIVIL,ZESTRIL) 40 MG tablet Take 1 tablet (40 mg total) by mouth daily. 10/23/16  Yes Minus Breeding, MD  loratadine (CLARITIN) 10 MG tablet Take 1 tablet (10 mg total) by mouth daily. 10/06/16  Yes Briscoe Deutscher, DO  metoprolol succinate (TOPROL-XL) 50 MG 24 hr tablet Take 1 tablet (50 mg total) by mouth daily. Take with or immediately following a meal. 10/23/16  Yes Minus Breeding, MD  mirabegron ER (MYRBETRIQ) 25 MG TB24 tablet Take 1 tablet (25 mg total) by mouth daily. 01/29/17  Yes Briscoe Deutscher, DO  morphine (MS CONTIN) 15 MG 12 hr tablet Take 1 tablet (15 mg total) by mouth every 12 (twelve) hours. 01/29/17  Yes Briscoe Deutscher, DO  PRESCRIPTION MEDICATION Place 1 application into the left eye at bedtime as needed (rash). Opthalmic ointment   Yes [provider]  sertraline (ZOLOFT) 25 MG tablet Take 1 tablet (25 mg total) by mouth daily. 01/29/17  Yes Briscoe Deutscher, DO  ticagrelor (BRILINTA) 90 MG TABS tablet Take 1 tablet (90 mg total) by mouth 2 (two) times daily. 10/23/16  Yes Minus Breeding, MD  furosemide (LASIX) 20 MG tablet TAKE 2 TABLETS (40 MG TOTAL) BY MOUTH DAILY. 02/19/17   Minus Breeding, MD  guaiFENesin (MUCINEX) 600 MG 12 hr tablet Take 1 tablet (600 mg total) by  mouth 2 (two) times daily. 02/19/17   Charlesetta Shanks, MD  nitroGLYCERIN (NITROSTAT) 0.4 MG SL tablet Place 1 tablet (0.4 mg total) under the tongue every 5 (five) minutes as needed for chest pain. 10/03/16 01/01/17  Minus Breeding, MD  oxyCODONE-acetaminophen (ROXICET) 5-325 MG tablet Take 1 tablet by mouth every 8 (eight) hours as needed for severe pain. 09/25/16   Briscoe Deutscher, DO  rizatriptan (MAXALT) 10 MG tablet Take 1 tablet for headache.  May repeat once in 2 hours if needed.  Do not exceed 2 tablets in 24 hours. 10/25/16   Pieter Partridge, DO    Family History Family History  Problem Relation Age of Onset  . Heart disease Mother 44       CABG, pacemaker, valve  . Cancer Father   . Heart disease Sister  Fluid around the heart    Social History Social History  Substance Use Topics  . Smoking status: Never Smoker  . Smokeless tobacco: Never Used  . Alcohol use Yes     Comment: Socially. [name].     Allergies   Ampicillin; Asa [aspirin]; Darvon [propoxyphene]; Eggs or egg-derived products; Milk-related compounds; Salicylates; and Tetracyclines & related   Review of Systems Review of Systems 10 Systems reviewed and are negative for acute change except as noted in the HPI.   Physical Exam Updated Vital Signs BP (!) 125/59   Pulse (!) 58   Temp 98.2 F (36.8 C) (Oral)   Resp 14   SpO2 100%   Physical Exam  Constitutional: She is oriented to person, place, and time.  Patient is alert and nontoxic. Central obesity. No respiratory distress however patient is hyperventilating slightly and creating very mild inspiratory stridor by taking rapid deep breaths. She can talk through this and then symptoms started improving.  HENT:  Head: Normocephalic and atraumatic.  Posterior airway normal. Widely patent. No pooling secretions. Nares clear and patent.  Eyes: EOM are normal. Pupils are equal, round, and reactive to light.  Neck: Neck supple. No thyromegaly present.    Patient creates some stridor with deep rapid breath however auscultation over the throat does not reveal any stridor.  Cardiovascular: Normal rate, regular rhythm, normal heart sounds and intact distal pulses.   Pulmonary/Chest: Effort normal and breath sounds normal. No stridor.  Abdominal: Soft. She exhibits no distension. There is no tenderness. There is no guarding.  Musculoskeletal: Normal range of motion. She exhibits no edema or tenderness.  Lymphadenopathy:    She has no cervical adenopathy.  Neurological: She is alert and oriented to person, place, and time. No cranial nerve deficit. She exhibits normal muscle tone. Coordination normal.  Skin: Skin is warm and dry.  Psychiatric:  Patient is slightly anxious. She is hyperventilating mildly. She is appropriately interactive and speech is slightly pressured but normal.     ED Treatments / Results  Labs (all labs ordered are listed, but only abnormal results are displayed) Labs Reviewed - No data to display  EKG  EKG Interpretation None       Radiology No results found.  Procedures Procedures (including critical care time)  Medications Ordered in ED Medications  lidocaine (XYLOCAINE) 4 % (PF) injection 4 mL (2.5 mLs Other Given 02/19/17 1154)     Initial Impression / Assessment and Plan / ED Course  I have reviewed the triage vital signs and the nursing notes.  Pertinent labs & imaging results that were available during my care of the patient were reviewed by me and considered in my medical decision making (see chart for details).      Final Clinical Impressions(s) / ED Diagnoses   Final diagnoses:  Laryngospasms  Post-nasal drip   Upon patient's presentation she had appearance of being anxious with some hyperventilating and mild laryngospasm. He describes these episodes as being episodic for a number of months. She also describes some problems with dysphagia. At this time however there is no evidence of  airway closure. She also identifies thickened secretions is often precipitating symptoms. Patient was given nebulized lidocaine with good effect. I feel she is stable for continued outpatient evaluation. Is no evidence of frank airway closure although some degree of laryngospasm which is episodic. Also she describes dysphasia. She is not having any difficulty handling secretions. She is counseled on continued outpatient evaluation for these symptoms.  New Prescriptions New Prescriptions   GUAIFENESIN (MUCINEX) 600 MG 12 HR TABLET    Take 1 tablet (600 mg total) by mouth 2 (two) times daily.     Charlesetta Shanks, MD 02/19/17 [phone]

## 2017-02-19 NOTE — ED Triage Notes (Signed)
Pt from cardiac rehab; felt like having panic attack (went to bathroom and has hx of such). Became SOB; has rescue inhaler for asthma that she uses but did not bring with her; reports she feels tight; when cough deeply feels pain in back.

## 2017-02-19 NOTE — Telephone Encounter (Signed)
-----   Message from Atha Starks sent at 02/18/2017  7:07 PM EDT ----- Regarding: RE: new patient referral Hello,  July 6 at 9:45 AM when she leaves her appointment with you. I apologize for the delay.  Doris ----- Message ----- From: Lowell Guitar, RN Sent: 02/02/2017   8:18 AM To: Atha Starks Subject: new patient referral                           Dear Tamela Oddi,   Pt is looking to establish care with new PCP.  If possible, she will also need pharmacy support for medication management.  She has multiple chronic conditions.   Please contact to schedule appointment.    Thank you, Andi Hence, RN, BSN Cardiac Pulmonary Rehab

## 2017-02-21 ENCOUNTER — Telehealth (HOSPITAL_COMMUNITY): Payer: Self-pay | Admitting: Cardiac Rehabilitation

## 2017-02-21 ENCOUNTER — Ambulatory Visit (HOSPITAL_COMMUNITY): Payer: Medicare Other

## 2017-02-21 ENCOUNTER — Encounter (HOSPITAL_COMMUNITY): Payer: Medicare Other

## 2017-02-21 NOTE — Telephone Encounter (Signed)
(  late entry for 02/20/2017 17:00)  pc to pt to advise of need to hold cardiac rehab until cleared by Dr. Percival Spanish.  Pt also given new patient appt information to establish care with [facility] Medicine [facility] on 03/02/2017 @ 945 am.  Pt states she will be unable to keep that appt due to family travel.  Pt would like to call clinic herself to reschedule.  Phone number to internal medicine clinic given to pt.  Pt verbalized understanding.

## 2017-02-21 NOTE — Telephone Encounter (Signed)
-----   Message from Minus Breeding, MD sent at 02/20/2017  4:36 PM EDT ----- Regarding: RE: CARDIAC REHAB  Yes ----- Message ----- From: Lowell Guitar, RN Sent: 02/20/2017   4:17 PM To: Minus Breeding, MD Subject: CARDIAC REHAB                                  Dear Dr. Percival Spanish,  Pt was treated and released in ED for dyspnea.  Is it ok for her to return to cardiac rehab?  Thank you, Andi Hence, RN, BSN Cardiac Pulmonary Rehab

## 2017-02-22 ENCOUNTER — Encounter: Payer: Self-pay | Admitting: Family Medicine

## 2017-02-23 ENCOUNTER — Ambulatory Visit (HOSPITAL_COMMUNITY): Payer: Medicare Other

## 2017-02-23 ENCOUNTER — Encounter (HOSPITAL_COMMUNITY): Payer: Medicare Other

## 2017-02-25 ENCOUNTER — Other Ambulatory Visit: Payer: Self-pay | Admitting: Family Medicine

## 2017-02-26 ENCOUNTER — Telehealth: Payer: Self-pay | Admitting: Family Medicine

## 2017-02-26 ENCOUNTER — Ambulatory Visit (HOSPITAL_COMMUNITY): Payer: Medicare Other

## 2017-02-26 ENCOUNTER — Telehealth (HOSPITAL_COMMUNITY): Payer: Self-pay | Admitting: Cardiac Rehabilitation

## 2017-02-26 ENCOUNTER — Encounter (HOSPITAL_COMMUNITY): Payer: Medicare Other

## 2017-02-26 NOTE — Telephone Encounter (Signed)
-----   Message from Minus Breeding, MD sent at 02/24/2017 12:07 PM EDT ----- Regarding: RE: cardiac rehab  We can get her follow up to discuss.   Please arrange a follow up with APP.    ----- Message ----- From: Lowell Guitar, RN Sent: 02/20/2017   7:03 AM To: Minus Breeding, MD Subject: cardiac rehab                                  Dear Dr. Percival Spanish,  Pt was sent to ED yesterday from cardiac rehab  for significant dyspnea and wheezing with difficulty swallowing and feeling of throat constriction.  Pt reported to ED physician she has experienced these symptoms progressively worsening for several months.  Could it be her brilinta causing this?  If so, would it be possible for her to switch to plavix?  Thank you, Andi Hence, RN, BSN Cardiac Pulmonary Rehab

## 2017-02-26 NOTE — Telephone Encounter (Signed)
Please advise on refill.

## 2017-02-26 NOTE — Telephone Encounter (Signed)
Patient dismissed from [facility] by Briscoe Deutscher DO , effective February 22, 2017. Dismissal letter sent out by certified / registered mail.  daj

## 2017-02-28 ENCOUNTER — Ambulatory Visit (HOSPITAL_COMMUNITY): Payer: Medicare Other

## 2017-03-02 ENCOUNTER — Ambulatory Visit (HOSPITAL_COMMUNITY): Payer: Medicare Other

## 2017-03-02 ENCOUNTER — Ambulatory Visit: Payer: Medicare Other

## 2017-03-02 ENCOUNTER — Encounter (HOSPITAL_COMMUNITY): Payer: Medicare Other

## 2017-03-05 ENCOUNTER — Encounter (HOSPITAL_COMMUNITY): Payer: Medicare Other

## 2017-03-05 ENCOUNTER — Ambulatory Visit (HOSPITAL_COMMUNITY): Payer: Medicare Other

## 2017-03-07 ENCOUNTER — Encounter (HOSPITAL_COMMUNITY): Admission: RE | Admit: 2017-03-07 | Payer: Medicare Other | Source: Ambulatory Visit

## 2017-03-08 ENCOUNTER — Telehealth: Payer: Self-pay | Admitting: *Deleted

## 2017-03-08 NOTE — Telephone Encounter (Signed)
appt made July 20th @ 10:30 with [name]

## 2017-03-08 NOTE — Telephone Encounter (Signed)
-----   Message from Minus Breeding, MD sent at 02/24/2017 12:07 PM EDT ----- Regarding: RE: cardiac rehab  We can get her follow up to discuss.   Please arrange a follow up with APP.    ----- Message ----- From: Lowell Guitar, RN Sent: 02/20/2017   7:03 AM To: Minus Breeding, MD Subject: cardiac rehab                                  Dear Dr. Percival Spanish,  Pt was sent to ED yesterday from cardiac rehab  for significant dyspnea and wheezing with difficulty swallowing and feeling of throat constriction.  Pt reported to ED physician she has experienced these symptoms progressively worsening for several months.  Could it be her brilinta causing this?  If so, would it be possible for her to switch to plavix?  Thank you, Andi Hence, RN, BSN Cardiac Pulmonary Rehab

## 2017-03-09 ENCOUNTER — Encounter (HOSPITAL_COMMUNITY)
Admission: RE | Admit: 2017-03-09 | Discharge: 2017-03-09 | Disposition: A | Discharge status: Home / Self Care | Payer: Medicare Other | Source: Ambulatory Visit | Attending: Cardiology | Admitting: Cardiology

## 2017-03-09 DIAGNOSIS — Z955 Presence of coronary angioplasty implant and graft: Secondary | ICD-10-CM | POA: Diagnosis not present

## 2017-03-09 DIAGNOSIS — I214 Non-ST elevation (NSTEMI) myocardial infarction: Secondary | ICD-10-CM

## 2017-03-09 NOTE — Progress Notes (Signed)
Pt returned to cardiac rehab today after extended absence for family travel. Pt reports she rescheduled new patient appt with internal medicine clinic and upcoming cardiology appt.   Pt is requesting appointments with multiple providers, ie sleep disorders, asthma, orthopaedic and podiatry.  Pt also requests appointment with behavioral health to establish care.  Pt advised to discuss specialty referrals with internal medicine clinic. Pt does exhibit depression symptoms.  PHQ=12.   Pt tearful as she reports decreased pleasure and desire to participate in daily activities.  Pt offered emotioanl support and reassurance.   Attempt made to schedule behavioral health consult for depression.  Answering machine message states office is closed, 03/09/2017.    Will continue to work towards getting consultation scheduled.

## 2017-03-12 ENCOUNTER — Encounter (HOSPITAL_COMMUNITY)
Admission: RE | Admit: 2017-03-12 | Discharge: 2017-03-12 | Disposition: A | Discharge status: Home / Self Care | Payer: Medicare Other | Source: Ambulatory Visit | Attending: Cardiology | Admitting: Cardiology

## 2017-03-12 DIAGNOSIS — Z955 Presence of coronary angioplasty implant and graft: Secondary | ICD-10-CM

## 2017-03-12 DIAGNOSIS — I214 Non-ST elevation (NSTEMI) myocardial infarction: Secondary | ICD-10-CM

## 2017-03-12 NOTE — Progress Notes (Signed)
Alisha [name] 66 y.o. female Nutrition Note Spoke with pt. Nutrition Survey reviewed with pt. Pt is following Step 2 of the Therapeutic Lifestyle Changes diet. Pt wants to lose wt. Per pt, she weighed 335 lb before her stent. Pt wt today 289.7 lb, which is down 45 lb (13.5% decrease in body wt). Pt has lost wt by "not buying food I know I'll eat too much of." Pt expressed understanding of the information reviewed. Pt aware of nutrition education classes offered and plans on attending nutrition classes. No results found for: HGBA1C Wt Readings from Last 3 Encounters:  01/16/17 292 lb 15.9 oz (132.9 kg)  01/11/17 291 lb 12.8 oz (132.4 kg)  11/09/16 286 lb 9.6 oz (130 kg)   Nutrition Diagnosis ? Food-and nutrition-related knowledge deficit related to lack of exposure to information as related to diagnosis of: ? CVD ? Pre-DM ? Obesity related to excessive energy intake as evidenced by a BMI of 48.5 Nutrition Intervention ? Benefits of adopting Therapeutic Lifestyle Changes discussed when Medficts reviewed. ? Pt to attend the Portion Distortion class ? Pt to attend the  ? Nutrition I class                      ? Nutrition II class ? Pt given handouts for: ? Nutrition I class ? Nutrition II class  ? Continue client-centered nutrition education by RD, as part of interdisciplinary care.  Goal(s) ? Pt to identify food quantities necessary to achieve weight loss of 6-24 lb (2.7-10.9 kg) at graduation from cardiac rehab.   Monitor and Evaluate progress toward nutrition goal with team.  Derek Mound, M.Ed, RD, LDN, CDE 03/12/2017 9:16 AM

## 2017-03-13 NOTE — Progress Notes (Signed)
Cardiac Individual Treatment Plan  Patient Details  Name: Alisha [name] MRN: [id number] Date of Birth: 07-26-1951 Referring Provider:     CARDIAC REHAB PHASE II ORIENTATION from 01/16/2017 in [facility]  Referring Provider  Marijo File MD      Initial Encounter Date:    CARDIAC REHAB PHASE II ORIENTATION from 01/16/2017 in [facility]  Date  01/16/17  Referring Provider  Marijo File MD      Visit Diagnosis: 05/2016 NSTEMI (non-ST elevated myocardial infarction) ([place])  05/2016 Status post coronary artery stent placement  Patient's Home Medications on Admission:  Current Outpatient Prescriptions:  .  albuterol (PROVENTIL HFA;VENTOLIN HFA) 108 (90 Base) MCG/ACT inhaler, Inhale 2 puffs into the lungs every 6 (six) hours as needed for wheezing or shortness of breath., Disp: 1 Inhaler, Rfl: 6 .  albuterol (PROVENTIL) (2.5 MG/3ML) 0.083% nebulizer solution, Take 3 mLs (2.5 mg total) by nebulization every 6 (six) hours as needed for wheezing or shortness of breath., Disp: 150 mL, Rfl: 1 .  amLODipine (NORVASC) 10 MG tablet, Take 1 tablet (10 mg total) by mouth daily., Disp: 90 tablet, Rfl: 1 .  aspirin EC 81 MG tablet, Take 1 tablet (81 mg total) by mouth daily., Disp: 90 tablet, Rfl: 1 .  atorvastatin (LIPITOR) 20 MG tablet, Take 1 tablet (20 mg total) by mouth daily., Disp: 90 tablet, Rfl: 1 .  budesonide-formoterol (SYMBICORT) 160-4.5 MCG/ACT inhaler, Inhale 2 puffs into the lungs 2 (two) times daily., Disp: 1 Inhaler, Rfl: 6 .  esomeprazole (NEXIUM) 40 MG capsule, Take 1 capsule (40 mg total) by mouth daily., Disp: 30 capsule, Rfl: 3 .  furosemide (LASIX) 20 MG tablet, TAKE 2 TABLETS (40 MG TOTAL) BY MOUTH DAILY., Disp: 60 tablet, Rfl: 6 .  gabapentin (NEURONTIN) 600 MG tablet, Take 1 tablet (600 mg total) by mouth 3 (three) times daily., Disp: 90 tablet, Rfl: 3 .  guaiFENesin (MUCINEX) 600 MG 12 hr tablet, Take 1  tablet (600 mg total) by mouth 2 (two) times daily., Disp: 60 tablet, Rfl: 1 .  isosorbide dinitrate (ISORDIL) 30 MG tablet, Take 30 mg by mouth daily., Disp: , Rfl:  .  leflunomide (ARAVA) 20 MG tablet, Take 1 tablet (20 mg total) by mouth daily., Disp: 30 tablet, Rfl: 0 .  lisinopril (PRINIVIL,ZESTRIL) 40 MG tablet, Take 1 tablet (40 mg total) by mouth daily., Disp: 90 tablet, Rfl: 1 .  loratadine (CLARITIN) 10 MG tablet, Take 1 tablet (10 mg total) by mouth daily., Disp: 30 tablet, Rfl: 6 .  metoprolol succinate (TOPROL-XL) 50 MG 24 hr tablet, Take 1 tablet (50 mg total) by mouth daily. Take with or immediately following a meal., Disp: 90 tablet, Rfl: 1 .  morphine (MS CONTIN) 15 MG 12 hr tablet, Take 1 tablet (15 mg total) by mouth every 12 (twelve) hours., Disp: 60 tablet, Rfl: 0 .  MYRBETRIQ 25 MG TB24 tablet, TAKE 1 TABLET BY MOUTH EVERY DAY, Disp: 30 tablet, Rfl: 0 .  nitroGLYCERIN (NITROSTAT) 0.4 MG SL tablet, Place 1 tablet (0.4 mg total) under the tongue every 5 (five) minutes as needed for chest pain., Disp: 25 tablet, Rfl: 3 .  oxyCODONE-acetaminophen (ROXICET) 5-325 MG tablet, Take 1 tablet by mouth every 8 (eight) hours as needed for severe pain., Disp: 90 tablet, Rfl: 0 .  PRESCRIPTION MEDICATION, Place 1 application into the left eye at bedtime as needed (rash). Opthalmic ointment, Disp: , Rfl:  .  rizatriptan (MAXALT)  10 MG tablet, Take 1 tablet for headache.  May repeat once in 2 hours if needed.  Do not exceed 2 tablets in 24 hours., Disp: 10 tablet, Rfl: 3 .  sertraline (ZOLOFT) 25 MG tablet, TAKE 1 TABLET BY MOUTH EVERY DAY, Disp: 30 tablet, Rfl: 0 .  ticagrelor (BRILINTA) 90 MG TABS tablet, Take 1 tablet (90 mg total) by mouth 2 (two) times daily., Disp: 180 tablet, Rfl: 1  Past Medical History: Past Medical History:  Diagnosis Date  . Arthritis   . Asthma   . CAD (coronary artery disease) 07/07/2016   a. s/p stenting x 2 in [place] to the LAD, RCA is chronically occluded,  LVEDP was 34  . Depression   . Fibromyalgia   . GERD (gastroesophageal reflux disease)   . History of benign pituitary tumor   . Hyperlipidemia   . Hypertension   . Migraine   . Morbid obesity ([place])   . Vertigo     Tobacco Use: History  Smoking Status  . Never Smoker  Smokeless Tobacco  . Never Used    Labs: Recent Review Flowsheet Data    Labs for ITP Cardiac and Pulmonary Rehab Latest Ref Rng & Units 11/15/2016   Cholestrol <200 mg/dL 172   LDLCALC <100 mg/dL 105(H)   HDL >50 mg/dL 37(L)   Trlycerides <150 mg/dL 149      Capillary Blood Glucose: No results found for: GLUCAP   Exercise Target Goals:    Exercise Program Goal: Individual exercise prescription set with THRR, safety & activity barriers. Participant demonstrates ability to understand and report RPE using BORG scale, to self-measure pulse accurately, and to acknowledge the importance of the exercise prescription.  Exercise Prescription Goal: Starting with aerobic activity 30 plus minutes a day, 3 days per week for initial exercise prescription. Provide home exercise prescription and guidelines that participant acknowledges understanding prior to discharge.  Activity Barriers & Risk Stratification:     Activity Barriers & Cardiac Risk Stratification - 11/02/16 1146      Activity Barriers & Cardiac Risk Stratification   Activity Barriers Left Knee Replacement;Right Knee Replacement;Neck/Spine Problems;Back Problems;Arthritis;Joint Problems;Deconditioning;Muscular Weakness;Shortness of Breath;Balance Concerns;History of Falls;Assistive Device;Other (comment)   Comments B ankle fusion, L wrist surgery, B carpal tunnel surgery, L wrist cyst removal   Cardiac Risk Stratification High      6 Minute Walk:     6 Minute Walk    Row Name 11/02/16 1135 01/16/17 1010 01/16/17 1017     6 Minute Walk   Phase Initial  -  -   Distance 0.45 feet  step test  .45km 0.48 feet  km not feet... pt did a step test   -   Walk Time 6 minutes 6 minutes  -   # of Rest Breaks 0 0  -   MPH 0.27  -  -   METS 2 2  -   RPE 12 13  -   VO2 Peak 7  -  -   Symptoms No No Yes (comment)   Comments  -  - R knee discomfort   Resting HR 62 bpm 57 bpm  -   Resting BP 110/72 120/70  -   Max Ex. HR 90 bpm 74 bpm  -   Max Ex. BP 126/70 142/60  -   2 Minute Post BP 110/74  -  -      Oxygen Initial Assessment:   Oxygen Re-Evaluation:   Oxygen Discharge (Final Oxygen Re-Evaluation):  Initial Exercise Prescription:     Initial Exercise Prescription - 01/16/17 1000      Date of Initial Exercise RX and Referring Provider   Date 01/16/17   Referring Provider Marijo File MD     NuStep   Level 3   SPM 80   Minutes 20   METs 1.8     Arm Ergometer   Level 1   Minutes 10   METs 1.3     Prescription Details   Frequency (times per week) 3   Duration Progress to 30 minutes of continuous aerobic without signs/symptoms of physical distress     Intensity   THRR 40-80% of Max Heartrate 61-122   Ratings of Perceived Exertion 11-13   Perceived Dyspnea 0-4     Progression   Progression Continue to progress workloads to maintain intensity without signs/symptoms of physical distress.     Resistance Training   Training Prescription Yes   Weight 2lbs   Reps 10-15      Perform Capillary Blood Glucose checks as needed.  Exercise Prescription Changes:     Exercise Prescription Changes    Row Name 01/30/17 1100 02/16/17 1602 03/09/17 1600         Response to Exercise   Blood Pressure (Admit) 156/70 124/62 128/82     Blood Pressure (Exercise) 182/70 120/80 160/76     Blood Pressure (Exit) 150/70 142/80 118/70     Heart Rate (Admit) 67 bpm 68 bpm 84 bpm     Heart Rate (Exercise) 95 bpm 117 bpm 134 bpm     Heart Rate (Exit) 64 bpm 92 bpm 70 bpm     Rating of Perceived Exertion (Exercise) _0 Symptoms none pt felt fatigue and throbbing sensation in head, lack of sleep none     Comments  Pt is tolerating exercise very well  -  -     Duration Continue with 30 min of aerobic exercise without signs/symptoms of physical distress. Continue with 30 min of aerobic exercise without signs/symptoms of physical distress. Continue with 30 min of aerobic exercise without signs/symptoms of physical distress.     Intensity THRR unchanged THRR unchanged THRR unchanged       Progression   Progression Continue to progress workloads to maintain intensity without signs/symptoms of physical distress. Continue to progress workloads to maintain intensity without signs/symptoms of physical distress. Continue to progress workloads to maintain intensity without signs/symptoms of physical distress.     Average METs 1.9 2.2 2.2       Resistance Training   Training Prescription Yes Yes Yes     Weight  - 2lbs 3lbs     Reps 10-15 10-15 10-15     Time 10 Minutes 10 Minutes 10 Minutes       NuStep   Level _1 SPM 90 90 90     Minutes _2 METs 2.2 2.3 2.3       Arm Ergometer   Level _3 Minutes _4 METs 1.6 1.9 2       Track   Laps  - 8 12     Minutes  - 10 15     METs  - 2.39 2.59       Home Exercise Plan   Plans to continue exercise at  - Home (comment)  stationary bike and leg  exercises for balance Home (comment)  stationary bike and leg exercises for balance     Frequency  - Add 2 additional days to program exercise sessions. Add 2 additional days to program exercise sessions.     Initial Home Exercises Provided  - 02/16/17 02/16/17        Exercise Comments:     Exercise Comments    Row Name 02/13/17 1716           Exercise Comments Reviewed METs and goals. Pt is tolerating exercise fairly well; will continue to monitor activty levels and exercise progression.          Exercise Goals and Review:     Exercise Goals    Row Name 11/02/16 1148 01/16/17 0911 01/16/17 0912         Exercise Goals   Increase Physical Activity Yes  -  -      Intervention Provide advice, education, support and counseling about physical activity/exercise needs.;Develop an individualized exercise prescription for aerobic and resistive training based on initial evaluation findings, risk stratification, comorbidities and participant's personal goals. Provide advice, education, support and counseling about physical activity/exercise needs.;Develop an individualized exercise prescription for aerobic and resistive training based on initial evaluation findings, risk stratification, comorbidities and participant's personal goals.  -     Expected Outcomes Achievement of increased cardiorespiratory fitness and enhanced flexibility, muscular endurance and strength shown through measurements of functional capacity and personal statement of participant. Achievement of increased cardiorespiratory fitness and enhanced flexibility, muscular endurance and strength shown through measurements of functional capacity and personal statement of participant.  -     Increase Strength and Stamina Yes Yes -  get back to water aerobics at [facility], improve balance, strengthen back muscles and posture     Intervention Develop an individualized exercise prescription for aerobic and resistive training based on initial evaluation findings, risk stratification, comorbidities and participant's personal goals.;Provide advice, education, support and counseling about physical activity/exercise needs. Develop an individualized exercise prescription for aerobic and resistive training based on initial evaluation findings, risk stratification, comorbidities and participant's personal goals.;Provide advice, education, support and counseling about physical activity/exercise needs.  -     Expected Outcomes Achievement of increased cardiorespiratory fitness and enhanced flexibility, muscular endurance and strength shown through measurements of functional capacity and personal statement of participant. Achievement  of increased cardiorespiratory fitness and enhanced flexibility, muscular endurance and strength shown through measurements of functional capacity and personal statement of participant. -  Be able to improve walking tolerance without having to use scooter        Exercise Goals Re-Evaluation :     Exercise Goals Re-Evaluation    Row Name 02/13/17 1715 02/16/17 1538 03/13/17 1652         Exercise Goal Re-Evaluation   Exercise Goals Review Increase Physical Activity;Increase Strenth and Stamina Increase Physical Activity;Increase Strenth and Stamina Increase Physical Activity;Increase Strenth and Stamina     Comments Pt is doing well with ambulating and be more active in cardiac rehab. Pt overall MET avg has increased from 2.1 to 2.4 Reviewed home exercise with pt today.  Pt plans to walk for 10'( 1-2 bouts per day) and do leg exercises for balance and stability.  Reviewed THR, pulse, RPE, sign and symptoms, NTG use, and when to call 911 or MD.  Also discussed weather considerations and indoor options.  Pt voiced understanding. Pt plans to walk for 10'( 1-2 bouts per day) and do leg exercises for balance and stability.  pt has increased strength/stamina with increased walking distance.  pt is using motorized scooter less.      Expected Outcomes Pt will continue to improve in cardiorespiratory fitness Pt will continue to improve in cardiorespiratory fitness Pt will continue to improve in cardiorespiratory fitness         Discharge Exercise Prescription (Final Exercise Prescription Changes):     Exercise Prescription Changes - 03/09/17 1600      Response to Exercise   Blood Pressure (Admit) 128/82   Blood Pressure (Exercise) 160/76   Blood Pressure (Exit) 118/70   Heart Rate (Admit) 84 bpm   Heart Rate (Exercise) 134 bpm   Heart Rate (Exit) 70 bpm   Rating of Perceived Exertion (Exercise) 13   Symptoms none   Duration Continue with 30 min of aerobic exercise without signs/symptoms of  physical distress.   Intensity THRR unchanged     Progression   Progression Continue to progress workloads to maintain intensity without signs/symptoms of physical distress.   Average METs 2.2     Resistance Training   Training Prescription Yes   Weight 3lbs   Reps 10-15   Time 10 Minutes     NuStep   Level 3   SPM 90   Minutes 20   METs 2.3     Arm Ergometer   Level 1   Minutes 10   METs 2     Track   Laps 12   Minutes 15   METs 2.59     Home Exercise Plan   Plans to continue exercise at Home (comment)  stationary bike and leg exercises for balance   Frequency Add 2 additional days to program exercise sessions.   Initial Home Exercises Provided 02/16/17      Nutrition:  Target Goals: Understanding of nutrition guidelines, daily intake of sodium <1569m, cholesterol <2055m calories 30% from fat and 7% or less from saturated fats, daily to have 5 or more servings of fruits and vegetables.  Biometrics:     Pre Biometrics - 01/16/17 1021      Pre Biometrics   % Body Fat 57.1 %       Nutrition Therapy Plan and Nutrition Goals:     Nutrition Therapy & Goals - 11/07/16 1514      Nutrition Therapy   Diet Therapeutic Lifestyle Changes     Personal Nutrition Goals   Nutrition Goal 1-2 lb wt loss per week to a wt loss goal of 6-24 lb [place] regarding individualized specific dietary modifications aiming towards targeted core components such as weight, hypertension, lipid management, diabetes, heart failure and other comorbidities.   Expected Outcomes Short Term Goal: Understand basic principles of dietary content, such as calories, fat, sodium, cholesterol and nutrients.;Long Term Goal: Adherence to prescribed nutrition plan.      Nutrition Discharge: Nutrition Scores:     Nutrition Assessments - 03/12/17 0912      MEDFICTS Scores   Pre Score 36       Nutrition Goals Re-Evaluation:   Nutrition Goals Re-Evaluation:   Nutrition Goals Discharge (Final Nutrition Goals Re-Evaluation):   Psychosocial: Target Goals: Acknowledge presence or absence of significant depression and/or stress, maximize coping skills, provide positive support system. Participant is able to verbalize types and ability to use techniques and skills needed for reducing stress and depression.  Initial Review & Psychosocial Screening:     Initial Psych Review &  Screening - 01/16/17 1131      Initial Review   Current issues with Current Stress Concerns;Current Anxiety/Panic   Source of Stress Concerns Chronic Illness;Unable to participate in former interests or hobbies;Unable to perform yard/household activities;Financial;Transportation;Occupation   Comments pt with worsening deconditioning due to chronic illness notes health related anxiety is increasing.  pt eager to participate in CR activities to improve physical condition.      Family Dynamics   Good Support System? Yes  pt lives with her daughter   Comments Pt recently moved from [place] to [place] and is residing with her youngest daughter and grandchild.     Barriers   Psychosocial barriers to participate in program Psychosocial barriers identified (see note)      Quality of Life Scores:     Quality of Life - 01/16/17 1025      Quality of Life Scores   Health/Function Pre 10.75 %   Socioeconomic Pre 14.5 %   Psych/Spiritual Pre 15.33 %   Family Pre 12.63 %   GLOBAL Pre 12.96 %      PHQ-9: Recent Review Flowsheet Data    Depression screen [facility] 2/9 03/09/2017 01/24/2017 11/20/2016 09/25/2016   Decreased Interest 2 0 1 0   Down, Depressed, Hopeless _0 PHQ - 2 Score _1 Altered sleeping 2 - 3 -   Tired, decreased energy 2 - 3 -   Change in appetite - - 0 -   Feeling bad or failure about yourself  2 - 2 -   Trouble concentrating 2 - 2 -   Moving slowly or fidgety/restless  0 - 0 -   Suicidal thoughts 0 - 0 -   PHQ-9 Score 12 - 14 -   Difficult doing work/chores Very difficult - Very difficult -     Interpretation of Total Score  Total Score Depression Severity:  1-4 = Minimal depression, 5-9 = Mild depression, 10-14 = Moderate depression, 15-19 = Moderately severe depression, 20-27 = Severe depression   Psychosocial Evaluation and Intervention:     Psychosocial Evaluation - 02/14/17 1740      Psychosocial Evaluation & Interventions   Comments pt with health related anxiety/depression from physical deconditioning.  pt has recently moved from [place] to live with her daughter.  pt exhibits some degree of decreased anxiety as she regains more independence in her care.     Expected Outcomes pt will exhibit positive outlook with good coping skills.    Continue Psychosocial Services  Follow up required by staff      Psychosocial Re-Evaluation:     Psychosocial Re-Evaluation    [place] Name 03/12/17 1206             Psychosocial Re-Evaluation   Current issues with Current Depression;History of Depression;Current Anxiety/Panic;Current Stress Concerns;Current Sleep Concerns       Comments pt reports increased depression symptoms.  pt would like to establish care with local counselor.         Expected Outcomes pt will exhibit positive outlook with improved coping skills.        Interventions Therapist referral;Stress management education;Relaxation education;Encouraged to attend Cardiac Rehabilitation for the exercise       Continue Psychosocial Services  Follow up required by staff       Comments pt with worsening deconditioning due to chronic illness notes health related anxiety is increasing.  pt eager to participate in CR activities to improve physical condition.  Initial Review   Source of Stress Concerns Poor Coping Skills;Chronic Illness;Unable to participate in former interests or hobbies;Transportation;Occupation          Psychosocial  Discharge (Final Psychosocial Re-Evaluation):     Psychosocial Re-Evaluation - 03/12/17 1206      Psychosocial Re-Evaluation   Current issues with Current Depression;History of Depression;Current Anxiety/Panic;Current Stress Concerns;Current Sleep Concerns   Comments pt reports increased depression symptoms.  pt would like to establish care with local counselor.     Expected Outcomes pt will exhibit positive outlook with improved coping skills.    Interventions Therapist referral;Stress management education;Relaxation education;Encouraged to attend Cardiac Rehabilitation for the exercise   Continue Psychosocial Services  Follow up required by staff   Comments pt with worsening deconditioning due to chronic illness notes health related anxiety is increasing.  pt eager to participate in CR activities to improve physical condition.      Initial Review   Source of Stress Concerns Poor Coping Skills;Chronic Illness;Unable to participate in former interests or hobbies;Transportation;Occupation      Vocational Rehabilitation: Provide vocational rehab assistance to qualifying candidates.   Vocational Rehab Evaluation & Intervention:     Vocational Rehab - 01/16/17 1122      Initial Vocational Rehab Evaluation & Intervention   Assessment shows need for Vocational Rehabilitation Yes      Education: Education Goals: Education classes will be provided on a weekly basis, covering required topics. Participant will state understanding/return demonstration of topics presented.  Learning Barriers/Preferences:     Learning Barriers/Preferences - 01/16/17 0830      Learning Barriers/Preferences   Learning Barriers Sight   Learning Preferences Audio;Written Material;Verbal Instruction;Video      Education Topics: Count Your Pulse:  -Group instruction provided by verbal instruction, demonstration, patient participation and written materials to support subject.  Instructors address  importance of being able to find your pulse and how to count your pulse when at home without a heart monitor.  Patients get hands on experience counting their pulse with staff help and individually.   CARDIAC REHAB PHASE II EXERCISE from 03/09/2017 in [facility]  Date  01/26/17  Instruction Review Code  2- meets goals/outcomes      Heart Attack, Angina, and Risk Factor Modification:  -Group instruction provided by verbal instruction, video, and written materials to support subject.  Instructors address signs and symptoms of angina and heart attacks.    Also discuss risk factors for heart disease and how to make changes to improve heart health risk factors.   Functional Fitness:  -Group instruction provided by verbal instruction, demonstration, patient participation, and written materials to support subject.  Instructors address safety measures for doing things around the house.  Discuss how to get up and down off the floor, how to pick things up properly, how to safely get out of a chair without assistance, and balance training.   CARDIAC REHAB PHASE II EXERCISE from 03/09/2017 in [facility]  Date  11/10/16  Educator  [place]  Instruction Review Code  2- meets goals/outcomes      Meditation and Mindfulness:  -Group instruction provided by verbal instruction, patient participation, and written materials to support subject.  Instructor addresses importance of mindfulness and meditation practice to help reduce stress and improve awareness.  Instructor also leads participants through a meditation exercise.    CARDIAC REHAB PHASE II EXERCISE from 03/09/2017 in [facility]  Date  01/31/17  Instruction Review Code  2- meets goals/outcomes      Stretching for Flexibility and Mobility:  -Group instruction provided by verbal instruction, patient participation, and written materials to  support subject.  Instructors lead participants through series of stretches that are designed to increase flexibility thus improving mobility.  These stretches are additional exercise for major muscle groups that are typically performed during regular warm up and cool down.   CARDIAC REHAB PHASE II EXERCISE from 03/09/2017 in [facility]  Date  02/16/17  Instruction Review Code  2- meets goals/outcomes      Hands Only CPR:  -Group verbal, video, and participation provides a basic overview of AHA guidelines for community CPR. Role-play of emergencies allow participants the opportunity to practice calling for help and chest compression technique with discussion of AED use.   Hypertension: -Group verbal and written instruction that provides a basic overview of hypertension including the most recent diagnostic guidelines, risk factor reduction with self-care instructions and medication management.    Nutrition I class: Heart Healthy Eating:  -Group instruction provided by PowerPoint slides, verbal discussion, and written materials to support subject matter. The instructor gives an explanation and review of the Therapeutic Lifestyle Changes diet recommendations, which includes a discussion on lipid goals, dietary fat, sodium, fiber, plant stanol/sterol esters, sugar, and the components of a well-balanced, healthy diet.   CARDIAC REHAB PHASE II EXERCISE from 03/09/2017 in [place]  Date  03/12/17  Educator  RD  Instruction Review Code  Not applicable      Nutrition II class: Lifestyle Skills:  -Group instruction provided by PowerPoint slides, verbal discussion, and written materials to support subject matter. The instructor gives an explanation and review of label reading, grocery shopping for heart health, heart healthy recipe modifications, and ways to make healthier choices when eating out.   CARDIAC REHAB PHASE II EXERCISE from  03/09/2017 in [place]  Date  03/12/17  Educator  RD  Instruction Review Code  Not applicable      Diabetes Question & Answer:  -Group instruction provided by PowerPoint slides, verbal discussion, and written materials to support subject matter. The instructor gives an explanation and review of diabetes co-morbidities, pre- and post-prandial blood glucose goals, pre-exercise blood glucose goals, signs, symptoms, and treatment of hypoglycemia and hyperglycemia, and foot care basics.   CARDIAC REHAB PHASE II EXERCISE from 03/09/2017 in [place]  Date  03/09/17  Educator  RD  Instruction Review Code  2- meets goals/outcomes      Diabetes Blitz:  -Group instruction provided by PowerPoint slides, verbal discussion, and written materials to support subject matter. The instructor gives an explanation and review of the physiology behind type 1 and type 2 diabetes, diabetes medications and rational behind using different medications, pre- and post-prandial blood glucose recommendations and Hemoglobin A1c goals, diabetes diet, and exercise including blood glucose guidelines for exercising safely.    Portion Distortion:  -Group instruction provided by PowerPoint slides, verbal discussion, written materials, and food models to support subject matter. The instructor gives an explanation of serving size versus portion size, changes in portions sizes over the last 20 years, and what consists of a serving from each food group.   CARDIAC REHAB PHASE II EXERCISE from 03/09/2017 in [place]  Date  02/14/17  Educator  RD  Instruction Review Code  2- meets goals/outcomes  Stress Management:  -Group instruction provided by verbal instruction, video, and written materials to support subject matter.  Instructors review role of stress in heart disease and how to cope with stress positively.     Exercising  on Your Own:  -Group instruction provided by verbal instruction, power point, and written materials to support subject.  Instructors discuss benefits of exercise, components of exercise, frequency and intensity of exercise, and end points for exercise.  Also discuss use of nitroglycerin and activating EMS.  Review options of places to exercise outside of rehab.  Review guidelines for sex with heart disease.   Cardiac Drugs I:  -Group instruction provided by verbal instruction and written materials to support subject.  Instructor reviews cardiac drug classes: antiplatelets, anticoagulants, beta blockers, and statins.  Instructor discusses reasons, side effects, and lifestyle considerations for each drug class.   Cardiac Drugs II:  -Group instruction provided by verbal instruction and written materials to support subject.  Instructor reviews cardiac drug classes: angiotensin converting enzyme inhibitors (ACE-I), angiotensin II receptor blockers (ARBs), nitrates, and calcium channel blockers.  Instructor discusses reasons, side effects, and lifestyle considerations for each drug class.   CARDIAC REHAB PHASE II EXERCISE from 03/09/2017 in [place]  Date  02/07/17  Educator  Kennyth Lose  Instruction Review Code  2- meets goals/outcomes      Anatomy and Physiology of the Circulatory System:  Group verbal and written instruction and models provide basic cardiac anatomy and physiology, with the coronary electrical and arterial systems. Review of: AMI, Angina, Valve disease, Heart Failure, Peripheral Artery Disease, Cardiac Arrhythmia, Pacemakers, and the ICD.   CARDIAC REHAB PHASE II EXERCISE from 03/09/2017 in [place]  Date  01/24/17  Instruction Review Code  2- meets goals/outcomes      Other Education:  -Group or individual verbal, written, or video instructions that support the educational goals of the cardiac rehab  program.   Knowledge Questionnaire Score:     Knowledge Questionnaire Score - 01/16/17 1030      Knowledge Questionnaire Score   Pre Score 12/24      Core Components/Risk Factors/Patient Goals at Admission:     Personal Goals and Risk Factors at Admission - 01/16/17 1127      Core Components/Risk Factors/Patient Goals on Admission    Weight Management Obesity   Intervention Obesity: Provide education and appropriate resources to help participant work on and attain dietary goals.;Weight Management/Obesity: Establish reasonable short term and long term weight goals.;Weight Management: Develop a combined nutrition and exercise program designed to reach desired caloric intake, while maintaining appropriate intake of nutrient and fiber, sodium and fats, and appropriate energy expenditure required for the weight goal.;Weight Management: Provide education and appropriate resources to help participant work on and attain dietary goals.   Expected Outcomes Short Term: Continue to assess and modify interventions until short term weight is achieved;Long Term: Adherence to nutrition and physical activity/exercise program aimed toward attainment of established weight goal;Weight Loss: Understanding of general recommendations for a balanced deficit meal plan, which promotes 1-2 lb weight loss per week and includes a negative energy balance of [phone] kcal/d;Understanding recommendations for meals to include 15-35% energy as protein, 25-35% energy from fat, 35-60% energy from carbohydrates, less than 235m of dietary cholesterol, 20-35 gm of total fiber daily;Understanding of distribution of calorie intake throughout the day with the consumption of 4-5 meals/snacks;Weight Maintenance: Understanding of the daily nutrition guidelines, which includes 25-35% calories from fat, 7%  or less cal from saturated fats, less than 238m cholesterol, less than 1.5gm of sodium, & 5 or more servings of fruits and vegetables  daily   Improve shortness of breath with ADL's Yes   Intervention Provide education, individualized exercise plan and daily activity instruction to help decrease symptoms of SOB with activities of daily living.   Expected Outcomes Short Term: Achieves a reduction of symptoms when performing activities of daily living.   Hypertension Yes   Intervention Provide education on lifestyle modifcations including regular physical activity/exercise, weight management, moderate sodium restriction and increased consumption of fresh fruit, vegetables, and low fat dairy, alcohol moderation, and smoking cessation.;Monitor prescription use compliance.   Expected Outcomes Short Term: Continued assessment and intervention until BP is < 140/931mHG in hypertensive participants. < 130/8021mG in hypertensive participants with diabetes, heart failure or chronic kidney disease.;Long Term: Maintenance of blood pressure at goal levels.   Lipids Yes   Intervention Provide education and support for participant on nutrition & aerobic/resistive exercise along with prescribed medications to achieve LDL <14m16mDL >40mg11mExpected Outcomes Short Term: Participant states understanding of desired cholesterol values and is compliant with medications prescribed. Participant is following exercise prescription and nutrition guidelines.;Long Term: Cholesterol controlled with medications as prescribed, with individualized exercise RX and with personalized nutrition plan. Value goals: LDL < 14mg,29m > 40 mg.   Stress Yes   Intervention Offer individual and/or small group education and counseling on adjustment to heart disease, stress management and health-related lifestyle change. Teach and support self-help strategies.;Refer participants experiencing significant psychosocial distress to appropriate mental health specialists for further evaluation and treatment. When possible, include family members and significant others in  education/counseling sessions.   Personal Goal Other Yes   Personal [facility] muscles and posture   Intervention Provide exercise programming to assist with building exercise tolerance and aerobic capacity.   Expected Outcomes Pt will build on aerobic capacity and get back to water aerobics.      Core Components/Risk Factors/Patient Goals Review:      Goals and Risk Factor Review    Row Name 02/14/17 1737 03/12/17 1204           Core Components/Risk Factors/Patient Goals Review   Personal Goals Review Weight Management/Obesity;Improve shortness of breath with ADL's;Hypertension;Lipids;Stress;Other Weight Management/Obesity;Improve shortness of breath with ADL's;Hypertension;Lipids;Stress;Other      Review pt with multiple CAD risk factors displays eagerness to participate in CR program, however pt does have frequent absences.  pt is also eager to establish care with new PCP, sleep apnea specialist and pulmonlogist.  referrrals have been placed.  pt with multiple CAD risk factors displays eagerness to participate in CR program, however pt does have frequent absences.  pt has upcoming appt with internal medicine clinic to establish care.       Expected Outcomes pt will continue participation in CR exercise, nutrition and lifestyle education offerings to decrease overall risk factors.  pt will continue participation in CR exercise, nutrition and lifestyle education offerings to decrease overall risk factors.          Core Components/Risk Factors/Patient Goals at Discharge (Final Review):      Goals and Risk Factor Review - 03/12/17 1204      Core Components/Risk Factors/Patient Goals Review   Personal Goals Review Weight Management/Obesity;Improve shortness of breath with ADL's;Hypertension;Lipids;Stress;Other   Review pt with multiple CAD risk factors displays eagerness to participate in CR  program, however pt does have  frequent absences.  pt has upcoming appt with internal medicine clinic to establish care.    Expected Outcomes pt will continue participation in CR exercise, nutrition and lifestyle education offerings to decrease overall risk factors.       ITP Comments:     ITP Comments    Row Name 11/02/16 1127 01/16/17 0828 02/13/17 1531 03/12/17 1203 03/13/17 1611   ITP Comments Dr. Fransico Him, Medical Director Dr. Fransico Him, Medical Director  Dr. Fransico Him, Medical Director  Dr. Fransico Him, Medical Director  Dr. Fransico Him, Medical Director       Comments:  Pt is making expected progress toward personal goals after completing 10 sessions. Recommend continued exercise and life style modification education including  stress management and relaxation techniques to decrease cardiac risk profile.

## 2017-03-14 ENCOUNTER — Encounter: Payer: Self-pay | Admitting: Physician Assistant

## 2017-03-14 ENCOUNTER — Encounter (HOSPITAL_COMMUNITY)
Admission: RE | Admit: 2017-03-14 | Discharge: 2017-03-14 | Disposition: A | Discharge status: Home / Self Care | Payer: Medicare Other | Source: Ambulatory Visit | Attending: Cardiology | Admitting: Cardiology

## 2017-03-14 ENCOUNTER — Ambulatory Visit (INDEPENDENT_AMBULATORY_CARE_PROVIDER_SITE_OTHER): Payer: Medicare Other | Admitting: Physician Assistant

## 2017-03-14 VITALS — BP 134/65 | HR 66 | Ht 66.0 in | Wt 293.2 lb

## 2017-03-14 DIAGNOSIS — E785 Hyperlipidemia, unspecified: Secondary | ICD-10-CM

## 2017-03-14 DIAGNOSIS — I10 Essential (primary) hypertension: Secondary | ICD-10-CM

## 2017-03-14 DIAGNOSIS — R0602 Shortness of breath: Secondary | ICD-10-CM

## 2017-03-14 DIAGNOSIS — I2510 Atherosclerotic heart disease of native coronary artery without angina pectoris: Secondary | ICD-10-CM | POA: Diagnosis not present

## 2017-03-14 DIAGNOSIS — Z955 Presence of coronary angioplasty implant and graft: Secondary | ICD-10-CM

## 2017-03-14 DIAGNOSIS — I214 Non-ST elevation (NSTEMI) myocardial infarction: Secondary | ICD-10-CM

## 2017-03-14 MED ORDER — NEBIVOLOL HCL 5 MG PO TABS: 5.0000 mg | ORAL_TABLET | Freq: Every day | ORAL | 5 refills | Status: DC

## 2017-03-14 MED ORDER — NITROGLYCERIN 0.4 MG SL SUBL: 0.4000 mg | SUBLINGUAL_TABLET | SUBLINGUAL | 3 refills | Status: DC | PRN

## 2017-03-14 NOTE — Progress Notes (Signed)
Cardiology Office Note    Date:  03/14/2017   ID:  Alisha [name], DOB [birth year], MRN [id number]  PCP:  Patient, No Pcp Per  Cardiologist:  Dr. Percival Spanish  Chief Complaint  Patient presents with  . Follow-up    seen for Dr. Percival Spanish.     History of Present Illness:  Alisha [name] is a 66 y.o. female with past medical history of fibromyalgia, depression, asthma, hypertension, hyperlipidemia, morbid obesity, and CAD with stenting 2 to LAD 05/2016 in [place]. She has known chronically occluded RCA.  She presents today for evaluation of possible laryngospasm spell. She recently was seen in the emergency room for possible laryngospasm. She was sent there by cardiac rehabilitation. She says this is not the first time she experienced the symptom. She had a milder episode even prior to her cardiac catheterization in October 2017. The first time she daily had trouble swallowing was when she was eating a piece of bread in December 2017. She felt there was something stuck in her throat. Since then, she has been having intermittent episodes of dysphagia with both liquid and solid. Earlier this year she had another episode at rest. While in the cardiac rehabilitation, she says suddenly she could not taking a breath. She feels her throat tightening. It later spontaneously resolved. Her symptom does not since cardiac in nature. It sounds more GI related. Although I am unable to rule out possible bronchospasm. I will switch her Toprol-XL to Bystolic.    Past Medical History:  Diagnosis Date  . Arthritis   . Asthma   . CAD (coronary artery disease) 07/07/2016   a. s/p stenting x 2 in [place] to the LAD, RCA is chronically occluded, LVEDP was 34  . Depression   . Fibromyalgia   . GERD (gastroesophageal reflux disease)   . History of benign pituitary tumor   . Hyperlipidemia   . Hypertension   . Migraine   . Morbid obesity ([place])   . Vertigo     Past Surgical History:  Procedure Laterality Date   . carpel tunnel release Bilateral 2017  . EXCISION / CURETTAGE BONE CYST PHALANGES OF FOOT  2014   Removal of foot cyst   . PARTIAL HYSTERECTOMY  unknown   Patient still has ovaries  . pituitary tumor removal  1999  . TONSILLECTOMY    . TOTAL KNEE ARTHROPLASTY     TKR X 3    Current Medications: Outpatient Medications Prior to Visit  Medication Sig Dispense Refill  . albuterol (PROVENTIL HFA;VENTOLIN HFA) 108 (90 Base) MCG/ACT inhaler Inhale 2 puffs into the lungs every 6 (six) hours as needed for wheezing or shortness of breath. 1 Inhaler 6  . albuterol (PROVENTIL) (2.5 MG/3ML) 0.083% nebulizer solution Take 3 mLs (2.5 mg total) by nebulization every 6 (six) hours as needed for wheezing or shortness of breath. 150 mL 1  . amLODipine (NORVASC) 10 MG tablet Take 1 tablet (10 mg total) by mouth daily. 90 tablet 1  . aspirin EC 81 MG tablet Take 1 tablet (81 mg total) by mouth daily. 90 tablet 1  . budesonide-formoterol (SYMBICORT) 160-4.5 MCG/ACT inhaler Inhale 2 puffs into the lungs 2 (two) times daily. 1 Inhaler 6  . esomeprazole (NEXIUM) 40 MG capsule Take 1 capsule (40 mg total) by mouth daily. 30 capsule 3  . furosemide (LASIX) 20 MG tablet TAKE 2 TABLETS (40 MG TOTAL) BY MOUTH DAILY. 60 tablet 6  . gabapentin (NEURONTIN) 600 MG tablet Take 1 tablet (600 mg  total) by mouth 3 (three) times daily. 90 tablet 3  . guaiFENesin (MUCINEX) 600 MG 12 hr tablet Take 1 tablet (600 mg total) by mouth 2 (two) times daily. 60 tablet 1  . isosorbide dinitrate (ISORDIL) 30 MG tablet Take 30 mg by mouth daily.    Marland Kitchen leflunomide (ARAVA) 20 MG tablet Take 1 tablet (20 mg total) by mouth daily. 30 tablet 0  . lisinopril (PRINIVIL,ZESTRIL) 40 MG tablet Take 1 tablet (40 mg total) by mouth daily. 90 tablet 1  . loratadine (CLARITIN) 10 MG tablet Take 1 tablet (10 mg total) by mouth daily. 30 tablet 6  . morphine (MS CONTIN) 15 MG 12 hr tablet Take 1 tablet (15 mg total) by mouth every 12 (twelve) hours. 60  tablet 0  . MYRBETRIQ 25 MG TB24 tablet TAKE 1 TABLET BY MOUTH EVERY DAY 30 tablet 0  . oxyCODONE-acetaminophen (ROXICET) 5-325 MG tablet Take 1 tablet by mouth every 8 (eight) hours as needed for severe pain. 90 tablet 0  . PRESCRIPTION MEDICATION Place 1 application into the left eye at bedtime as needed (rash). Opthalmic ointment    . rizatriptan (MAXALT) 10 MG tablet Take 1 tablet for headache.  May repeat once in 2 hours if needed.  Do not exceed 2 tablets in 24 hours. 10 tablet 3  . sertraline (ZOLOFT) 25 MG tablet TAKE 1 TABLET BY MOUTH EVERY DAY 30 tablet 0  . ticagrelor (BRILINTA) 90 MG TABS tablet Take 1 tablet (90 mg total) by mouth 2 (two) times daily. 180 tablet 1  . metoprolol succinate (TOPROL-XL) 50 MG 24 hr tablet Take 1 tablet (50 mg total) by mouth daily. Take with or immediately following a meal. 90 tablet 1  . atorvastatin (LIPITOR) 20 MG tablet Take 1 tablet (20 mg total) by mouth daily. 90 tablet 1  . nitroGLYCERIN (NITROSTAT) 0.4 MG SL tablet Place 1 tablet (0.4 mg total) under the tongue every 5 (five) minutes as needed for chest pain. 25 tablet 3   No facility-administered medications prior to visit.      Allergies:   Ampicillin; Asa [aspirin]; Darvon [propoxyphene]; Eggs or egg-derived products; Milk-related compounds; Salicylates; and Tetracyclines & related   Social History   Social History  . Marital status: Divorced    Spouse name: N/A  . Number of children: N/A  . Years of education: N/A   Social History Main Topics  . Smoking status: Never Smoker  . Smokeless tobacco: Never Used  . Alcohol use Yes     Comment: Socially. [name].  . Drug use: No  . Sexual activity: No   Other Topics Concern  . None   Social History Narrative   Current Social History        Who lives at home: Lives with youngest daughter, "Olivia Mackie" 09/25/2016    Transportation: Public 09/15/1476   Important Relationships & Pets: 2 daughters (52, 47), 1 son (71), 10  grandchildren, 4 great grandchildren 09/25/2016    Current Stressors: Recently moved from [place], [place] due to threat of NH placement 09/25/2016   Religious / Personal Beliefs: Christian 09/25/2016   Interests / Fun: Dancing 09/25/2016   Other: Participates in [employer] 09/25/2016        Family History:  The patient's family history includes Cancer in her father; Heart disease in her sister; Heart disease (age of onset: 30) in her mother.   ROS:   Please see the history of present illness.    ROS All other systems  reviewed and are negative.   PHYSICAL EXAM:   VS:  BP 134/65 (BP Location: Right Arm, Cuff Size: Large)   Pulse 66   Ht 5\' 6"  (1.676 m)   Wt 293 lb 3.2 oz (133 kg)   BMI 47.32 kg/m    GEN: Well nourished, well developed, in no acute distress  HEENT: normal  Neck: no JVD, carotid bruits, or masses Cardiac: RRR; no murmurs, rubs, or gallops,no edema  Respiratory:  clear to auscultation bilaterally, normal work of breathing GI: soft, nontender, nondistended, + BS MS: no deformity or atrophy  Skin: warm and dry, no rash Neuro:  Alert and Oriented x 3, Strength and sensation are intact Psych: euthymic mood, full affect  Wt Readings from Last 3 Encounters:  03/14/17 293 lb 3.2 oz (133 kg)  01/16/17 292 lb 15.9 oz (132.9 kg)  01/11/17 291 lb 12.8 oz (132.4 kg)      Studies/Labs Reviewed:   EKG:  EKG is not ordered today.    Recent Labs: 12/13/2016: ALT 18; BUN 7; Creatinine, Ser 0.78; Hemoglobin 11.9; Platelets 191; Potassium 3.7; Sodium 144   Lipid Panel    Component Value Date/Time   CHOL 172 11/15/2016 1013   TRIG 149 11/15/2016 1013   HDL 37 (L) 11/15/2016 1013   CHOLHDL 4.6 11/15/2016 1013   VLDL 30 11/15/2016 1013   LDLCALC 105 (H) 11/15/2016 1013    Additional studies/ records that were reviewed today include:   Echo 06/29/2016    Cath 06/29/2016     ASSESSMENT:    1. SOB (shortness of breath)   2. Coronary artery disease involving native  coronary artery of native heart without angina pectoris   3. Essential hypertension   4. Hyperlipidemia, unspecified hyperlipidemia type   5. Morbid obesity ([place])      PLAN:  In order of problems listed above:  1. SOB: Symptom she described is very atypical for cardiac issue. I doubt this is a side effect from Brilinta. I cannot rule out bronchospasm, therefore will switch her Toprol-XL to Bystolic. Her symptoms sound more GI in nature, she had have trouble swallowing solid and liquid and a sometimes feel food gets stuck in her throat. I have advised her to discuss this with her primary care physician with possible referral to GI service.  2. CAD: Denies any obvious chest pain. She just had a drug-eluting stent in late last year, therefore she will need to continue on aspirin and Brilinta.   3. Hypertension: Blood pressure stable.  4. Hyperlipidemia: Continue 20 mg daily of Lipitor.     Medication Adjustments/Labs and Tests Ordered: Current medicines are reviewed at length with the patient today.  Concerns regarding medicines are outlined above.  Medication changes, Labs and Tests ordered today are listed in the Patient Instructions below. Patient Instructions  Medication Instructions:  STOP METOPROLOL SUCCINATE  START BYSTOLIC 5MG  DAILY If you need a refill on your cardiac medications before your next appointment, please call your pharmacy.  Follow-Up: Your physician wants you to follow-up in: [place] will receive a reminder letter in the mail two months in advance. If you don't receive a letter, please call our office November 2018 to schedule the January 2019 follow-up appointment.  REFER TO DR TURNER AT [facility]RE[address] OFFICE FOR CPAP-OBSTRUCTIVE SLEEP APNEA EVALUATION  Special Instructions: PLEASE CALL us IF YOU HAVE ANY ISSUES WITH BYSTOLIC   Thank you for choosing [facility] at [facility]!!  Hilbert Corrigan, [place]  03/14/2017 7:38 PM     [facility] [address], [place], [place]  [address] Phone: [phone]; Fax: [phone]

## 2017-03-14 NOTE — Patient Instructions (Addendum)
Medication Instructions:  STOP METOPROLOL SUCCINATE  START BYSTOLIC 5MG  DAILY If you need a refill on your cardiac medications before your next appointment, please call your pharmacy.  Follow-Up: Your physician wants you to follow-up in: Alisha [name] will receive a reminder letter in the mail two months in advance. If you don't receive a letter, please call our office November 2018 to schedule the January 2019 follow-up appointment.  REFER TO DR TURNER AT [facility] [address] OFFICE FOR CPAP-OBSTRUCTIVE SLEEP APNEA EVALUATION  Special Instructions: PLEASE CALL us IF YOU HAVE ANY ISSUES WITH BYSTOLIC   Thank you for choosing [facility] at [facility]!!

## 2017-03-16 ENCOUNTER — Encounter (HOSPITAL_COMMUNITY): Payer: Medicare Other

## 2017-03-16 ENCOUNTER — Telehealth: Payer: Self-pay | Admitting: General Practice

## 2017-03-16 ENCOUNTER — Ambulatory Visit: Payer: Medicare Other | Admitting: Physician Assistant

## 2017-03-16 NOTE — Telephone Encounter (Signed)
To clarify - I am okay with continuing refilling all medications that I have been continuing to fill for the 30 days period. This includes her MS Contin. We can have her pick this up on Monday. She must understand that it will be the last refill. I want to make sure that she will not run out of medications.

## 2017-03-16 NOTE — Telephone Encounter (Signed)
Plans to address patient's concerns. Left voicemail for patient to call office back. Pending call back.

## 2017-03-16 NOTE — Telephone Encounter (Signed)
Patient called requesting a refill on morphine (MS CONTIN) 15 MG 12 hr tablet and oxyCODONE-acetaminophen (ROXICET) 5-325 MG tablet. I was advised by clinical staff that patient has been dismissed from the practice (see epic letter) 02-22-17 and that controlled substance medications would not be able to be refilled.   I advised patient of the dismissal which she states she was unaware of and wanted to speak to someone clinical to explain why the dismissal was processed. I advised her that the call would be by the end of the day, not immediate due to patient load for the day.  Patient awaiting phone call.

## 2017-03-16 NOTE — Telephone Encounter (Signed)
Sharyn Lull can you please help with this.

## 2017-03-19 ENCOUNTER — Encounter (HOSPITAL_COMMUNITY): Payer: Medicare Other

## 2017-03-20 NOTE — Telephone Encounter (Signed)
Received signed domestic return receipt verifying delivery of certified letter on March 16, 2017. Article number [phone] [phone] ORV

## 2017-03-21 ENCOUNTER — Encounter (HOSPITAL_COMMUNITY): Payer: Medicare Other

## 2017-03-23 ENCOUNTER — Encounter: Payer: Self-pay | Admitting: Internal Medicine

## 2017-03-23 ENCOUNTER — Encounter (HOSPITAL_COMMUNITY)
Admission: RE | Admit: 2017-03-23 | Discharge: 2017-03-23 | Disposition: A | Discharge status: Home / Self Care | Payer: Medicare Other | Source: Ambulatory Visit | Attending: Cardiology | Admitting: Cardiology

## 2017-03-23 ENCOUNTER — Ambulatory Visit (INDEPENDENT_AMBULATORY_CARE_PROVIDER_SITE_OTHER): Payer: Medicare Other | Admitting: Internal Medicine

## 2017-03-23 DIAGNOSIS — Z79891 Long term (current) use of opiate analgesic: Secondary | ICD-10-CM | POA: Diagnosis not present

## 2017-03-23 DIAGNOSIS — G894 Chronic pain syndrome: Secondary | ICD-10-CM

## 2017-03-23 DIAGNOSIS — R1310 Dysphagia, unspecified: Secondary | ICD-10-CM

## 2017-03-23 DIAGNOSIS — I25119 Atherosclerotic heart disease of native coronary artery with unspecified angina pectoris: Secondary | ICD-10-CM

## 2017-03-23 DIAGNOSIS — Z79899 Other long term (current) drug therapy: Secondary | ICD-10-CM

## 2017-03-23 DIAGNOSIS — Z955 Presence of coronary angioplasty implant and graft: Secondary | ICD-10-CM

## 2017-03-23 DIAGNOSIS — I214 Non-ST elevation (NSTEMI) myocardial infarction: Secondary | ICD-10-CM

## 2017-03-23 DIAGNOSIS — Z0000 Encounter for general adult medical examination without abnormal findings: Secondary | ICD-10-CM | POA: Insufficient documentation

## 2017-03-23 MED ORDER — MORPHINE SULFATE ER 15 MG PO TBCR: 15.0000 mg | EXTENDED_RELEASE_TABLET | Freq: Every day | ORAL | 0 refills | Status: DC

## 2017-03-23 NOTE — Assessment & Plan Note (Addendum)
She is currently on morphine 15 mg twice a day with Roxicet available for breakthrough pain that she does not use this frequently per report. She states she has enough pain medicine to last about 4 more days and reports suffering from diarrhea when stopping morphine abruptly in the past. Today we discussed at length that opioid pain medication is not ideal for long-term management of pain and its associated adverse effects. She seems willing to taper her opioid usage which was started by her previous PCP, most recently decreasing morphine from 3 times a day to twice a day. We will provide a short supply of morphine with instructions to begin taking once a day to continue the weaning process. No refill for breakthrough Roxicet. Recommend continuing these discussions and introducing long-acting anti-inflammatory medicines at next visit. Instructed patient to follow-up with PCP who has availability within 2 weeks which would also provide more time to discuss healthcare maintenance and her chronic conditions.  -Decrease morphine from BID to once daily  -Continue weaning off opiods

## 2017-03-23 NOTE — Patient Instructions (Addendum)
It was nice to meet you today.  To better investigate your trouble swallowing and the episodes of feeling like her throat is closing, we have made referrals to speech-language pathologist and an ear nose and throat doctor. They should contact you to make these appointments. We will give you a one-month supply of morphine, however take this once per day instead of twice per day. Like we talked about, it is a good idea to decrease the amount of these very strong medicines and switch to medicines that will work better for your inflammation and pain. We'll get you back in with a primary care doctor who has availability within the next 2 weeks.

## 2017-03-23 NOTE — Progress Notes (Signed)
CC: Trouble swallowing  HPI:  [name] is a 66 y.o. F with past medical history as described below who presents to the clinic for evaluation of possible dysphagia and bronchospasm.  She reports 2 episodes of feeling like her throat is closing with difficulty breathing. She has been evaluated in the ED for each of these. There is no identifiable trigger for the episodes, no rash, no facial swelling. She feels it is a different process than an asthma attack which she describes as tightness in the chest.   She also has a several month history of difficulty swallowing. She notes food feels like it gets stuck in her lower throat or upper chest. She feels this is different than typical heartburn pain she experiences. To decrease the symptoms she has attempted to eat soft foods, smaller portions, drink fluids frequently and meals. She states it will also happen with liquids and cause coughing but she drinks through a straw to alleviate this.  She also notes she is almost out of her pain medications which she takes for chronic back pain, arthritis in multiple joints. She takes morphine bid (recently decreased from tid) and oxycodone-acetaminophe 5-325 mg for breakthrough pain which she does not use frequently.    Past Medical History:  Diagnosis Date  . Arthritis   . Asthma   . CAD (coronary artery disease) 07/07/2016   a. s/p stenting x 2 in [place] to the LAD, RCA is chronically occluded, LVEDP was 34  . Depression   . Fibromyalgia   . GERD (gastroesophageal reflux disease)   . History of benign pituitary tumor   . Hyperlipidemia   . Hypertension   . Migraine   . Morbid obesity ([place])   . Vertigo    Family History:  Family History  Problem Relation Age of Onset  . Heart disease Mother 13       CABG, pacemaker, valve  . Cancer Father   . Heart disease Sister        Fluid around the heart   Social History:  Social History  Substance Use Topics  . Smoking status: Never  Smoker  . Smokeless tobacco: Never Used  . Alcohol use Yes     Comment: Socially. [name].   Review of Systems:  Review of Systems  Constitutional: Negative for fever and weight loss.  HENT: Negative for sore throat.   Respiratory: Negative for hemoptysis.   Cardiovascular: Positive for leg swelling. Negative for chest pain.  Gastrointestinal: Negative for constipation.  Genitourinary: Negative for dysuria.  Musculoskeletal: Positive for back pain.  Skin: Negative for rash.  Neurological: Negative for loss of consciousness.     Physical Exam:  Vitals:   03/23/17 1025  BP: 140/62  Pulse: 67  Temp: 98.1 F (36.7 C)  TempSrc: Oral  SpO2: 100%  Weight: 295 lb 11.2 oz (134.1 kg)  Height: 5\' 6"  (1.676 m)   Physical Exam  Constitutional: She is oriented to person, place, and time. She appears well-developed and well-nourished. No distress.  HENT:  Head: Normocephalic and atraumatic.  Mouth/Throat: Oropharynx is clear and moist.  Eyes: Pupils are equal, round, and reactive to light. Conjunctivae and EOM are normal.  Neck: Neck supple.  No palpable masses or lymphadenopathy   Cardiovascular: Normal rate, regular rhythm, normal heart sounds and intact distal pulses.   Pulmonary/Chest: Effort normal and breath sounds normal. No stridor. No respiratory distress. She has no wheezes.  Abdominal: Soft. There is no tenderness. There is no guarding.  Musculoskeletal:  Pedal edema  Neurological: She is alert and oriented to person, place, and time. No cranial nerve deficit or sensory deficit. She exhibits normal muscle tone.  Skin: Skin is warm. Capillary refill takes less than 2 seconds.  Incisional surgical scar visible over dorsal L wrist  Psychiatric: Her behavior is normal. Thought content normal.    Assessment & Plan:   See Encounters Tab for problem based charting.  Patient seen with Dr. Daryll Drown

## 2017-03-23 NOTE — Assessment & Plan Note (Signed)
She reports several month history of dysphagia with feeling of food and liquid being stuck in her lower throat and upper chest. She was previously referred to ENT after an emergency department visit in which she described these symptoms, however an appointment was never made. She had a neck CT in April which showed effacement of her airway which could represent irritation/inflammation with no discrete masses.   Her reported episodes of potential laryngospasm would be further evaluated in a similar manner. Her cardiologist recently switched her beta blocker from metoprolol to nebivolol in case the beta blocker was contributing to these episodes.  -ENT referral -SLP referral

## 2017-03-26 ENCOUNTER — Encounter (HOSPITAL_COMMUNITY)
Admission: RE | Admit: 2017-03-26 | Discharge: 2017-03-26 | Disposition: A | Discharge status: Home / Self Care | Payer: Medicare Other | Source: Ambulatory Visit | Attending: Cardiology | Admitting: Cardiology

## 2017-03-26 DIAGNOSIS — Z955 Presence of coronary angioplasty implant and graft: Secondary | ICD-10-CM

## 2017-03-26 DIAGNOSIS — I214 Non-ST elevation (NSTEMI) myocardial infarction: Secondary | ICD-10-CM

## 2017-03-26 NOTE — Addendum Note (Signed)
Addended by: Tawny Asal A on: 03/26/2017 03:16 PM   Modules accepted: Orders

## 2017-03-26 NOTE — Progress Notes (Signed)
[facility] Rehab Services stated they will call pt to schedule an appt.

## 2017-03-27 NOTE — Progress Notes (Signed)
Internal Medicine Clinic Attending  I saw and evaluated the patient.  I personally confirmed the key portions of the history and exam documented by Dr. Harden and I reviewed pertinent patient test results.  The assessment, diagnosis, and plan were formulated together and I agree with the documentation in the resident's note.  

## 2017-03-28 ENCOUNTER — Encounter (HOSPITAL_COMMUNITY)
Admission: RE | Admit: 2017-03-28 | Discharge: 2017-03-28 | Disposition: A | Discharge status: Home / Self Care | Payer: Medicare Other | Source: Ambulatory Visit | Attending: Cardiology | Admitting: Cardiology

## 2017-03-28 DIAGNOSIS — Z955 Presence of coronary angioplasty implant and graft: Secondary | ICD-10-CM | POA: Diagnosis not present

## 2017-03-28 DIAGNOSIS — I214 Non-ST elevation (NSTEMI) myocardial infarction: Secondary | ICD-10-CM

## 2017-03-29 ENCOUNTER — Other Ambulatory Visit (HOSPITAL_COMMUNITY): Payer: Self-pay | Admitting: Internal Medicine

## 2017-03-29 DIAGNOSIS — R1310 Dysphagia, unspecified: Secondary | ICD-10-CM

## 2017-03-30 ENCOUNTER — Encounter (HOSPITAL_COMMUNITY): Payer: Medicare Other

## 2017-04-02 ENCOUNTER — Ambulatory Visit: Payer: Medicare Other | Admitting: Neurology

## 2017-04-02 ENCOUNTER — Encounter (HOSPITAL_COMMUNITY): Payer: Medicare Other

## 2017-04-04 ENCOUNTER — Encounter (HOSPITAL_COMMUNITY)
Admission: RE | Admit: 2017-04-04 | Discharge: 2017-04-04 | Disposition: A | Discharge status: Home / Self Care | Payer: Medicare Other | Source: Ambulatory Visit | Attending: Cardiology | Admitting: Cardiology

## 2017-04-04 ENCOUNTER — Ambulatory Visit (INDEPENDENT_AMBULATORY_CARE_PROVIDER_SITE_OTHER): Payer: Medicare Other | Admitting: Internal Medicine

## 2017-04-04 ENCOUNTER — Encounter: Payer: Self-pay | Admitting: Internal Medicine

## 2017-04-04 VITALS — BP 135/65 | HR 71 | Temp 97.9°F | Ht 66.0 in | Wt 295.4 lb

## 2017-04-04 DIAGNOSIS — M069 Rheumatoid arthritis, unspecified: Secondary | ICD-10-CM | POA: Diagnosis not present

## 2017-04-04 DIAGNOSIS — F3130 Bipolar disorder, current episode depressed, mild or moderate severity, unspecified: Secondary | ICD-10-CM | POA: Diagnosis not present

## 2017-04-04 DIAGNOSIS — F319 Bipolar disorder, unspecified: Secondary | ICD-10-CM

## 2017-04-04 DIAGNOSIS — Z79899 Other long term (current) drug therapy: Secondary | ICD-10-CM | POA: Diagnosis not present

## 2017-04-04 DIAGNOSIS — N3281 Overactive bladder: Secondary | ICD-10-CM | POA: Diagnosis not present

## 2017-04-04 DIAGNOSIS — L4050 Arthropathic psoriasis, unspecified: Secondary | ICD-10-CM | POA: Diagnosis not present

## 2017-04-04 DIAGNOSIS — Z955 Presence of coronary angioplasty implant and graft: Secondary | ICD-10-CM

## 2017-04-04 DIAGNOSIS — I214 Non-ST elevation (NSTEMI) myocardial infarction: Secondary | ICD-10-CM

## 2017-04-04 MED ORDER — MIRABEGRON ER 25 MG PO TB24: 25.0000 mg | ORAL_TABLET | Freq: Every day | ORAL | 0 refills | Status: DC

## 2017-04-04 NOTE — Patient Instructions (Signed)
Ms. Maudlin it was nice meeting you today.  -I have referred you to psychiatry and rheumatology  -Return for a follow-up in 4-6 weeks with your medications

## 2017-04-04 NOTE — Assessment & Plan Note (Signed)
Assessment Per chart, patient has a history of bipolar depression. She confirms having bipolar depression and states she was previously treated by psychiatry. PHQ9 score 14 at this visit consistent with moderate depression. Patient denies having any suicidal ideation. She is currently on Zoloft 25 mg daily. No antipsychotic or lithium seen on her medication list.  Plan -Continue Zoloft -Referral to psychiatry

## 2017-04-04 NOTE — Assessment & Plan Note (Signed)
Assessment Stable. Patient is due for a refill on Mrybetriq. States this medication helps with her overactive bladder.  Plan -Medication has been refill

## 2017-04-04 NOTE — Assessment & Plan Note (Signed)
Assessment Patient reports having rheumatoid arthritis which was previously treated by a rheumatologist in [place]. States she is currently not on any medications and has not seen a specialist here in [place]. Chart review shows she was prescribed leflunomide by her PCP and in February 2018 for a 1 month supply.  Plan -Referral to rheumatology

## 2017-04-04 NOTE — Progress Notes (Signed)
   CC: Patient does not have a complaint. She is here to talk about her medications. Bipolar depression, overactive bladder, and rheumatoid arthritis were discussed during this visit.  HPI:  [name] is a 66 y.o. female with a past medical history of conditions listed below presenting to the clinic to talk about her medications. She did not bring her medications with her. Bipolar depression, overactive bladder, and rheumatoid arthritis were discussed during this visit. Please see problem based charting for the status of the patient's current and chronic medical conditions.   Past Medical History:  Diagnosis Date  . Arthritis   . Asthma   . CAD (coronary artery disease) 07/07/2016   a. s/p stenting x 2 in [place] to the LAD, RCA is chronically occluded, LVEDP was 34  . Depression   . Fibromyalgia   . GERD (gastroesophageal reflux disease)   . History of benign pituitary tumor   . Hyperlipidemia   . Hypertension   . Migraine   . Morbid obesity ([place])   . Vertigo    Review of Systems: Pertinent positives mentioned in HPI. Remainder of all ROS negative.   Physical Exam:  Vitals:   04/04/17 0958  BP: 135/65  Pulse: 71  Temp: 97.9 F (36.6 C)  TempSrc: Oral  SpO2: 100%  Weight: 295 lb 6.4 oz (134 kg)  Height: 5\' 6"  (1.676 m)   Physical Exam  Constitutional: She is oriented to person, place, and time. She appears well-developed and well-nourished. No distress.  HENT:  Head: Normocephalic and atraumatic.  Eyes: Right eye exhibits no discharge. Left eye exhibits no discharge.  Cardiovascular: Normal rate, regular rhythm and intact distal pulses.   Pulmonary/Chest: Effort normal and breath sounds normal. No respiratory distress. She has no wheezes. She has no rales.  Abdominal: Soft. Bowel sounds are normal. She exhibits no distension. There is no tenderness.  Musculoskeletal:  Trace bilateral lower extremity edema (chronic per patient)  Neurological: She is alert and  oriented to person, place, and time.  Skin: Skin is warm and dry.    Assessment & Plan:   See Encounters Tab for problem based charting.  Patient discussed with Dr. Evette Doffing

## 2017-04-05 NOTE — Progress Notes (Signed)
Internal Medicine Clinic Attending  Case discussed with Dr. Rathoreat the time of the visit. We reviewed the resident's history and exam and pertinent patient test results. I agree with the assessment, diagnosis, and plan of care documented in the resident's note.  

## 2017-04-06 ENCOUNTER — Ambulatory Visit (HOSPITAL_COMMUNITY)
Admission: RE | Admit: 2017-04-06 | Discharge: 2017-04-06 | Disposition: A | Discharge status: Home / Self Care | Payer: Medicare Other | Source: Ambulatory Visit | Attending: Internal Medicine | Admitting: Internal Medicine

## 2017-04-06 ENCOUNTER — Telehealth: Payer: Self-pay | Admitting: *Deleted

## 2017-04-06 ENCOUNTER — Encounter (HOSPITAL_COMMUNITY)
Admission: RE | Admit: 2017-04-06 | Discharge: 2017-04-06 | Disposition: A | Discharge status: Home / Self Care | Payer: Medicare Other | Source: Ambulatory Visit | Attending: Cardiology | Admitting: Cardiology

## 2017-04-06 ENCOUNTER — Ambulatory Visit: Payer: Medicare Other

## 2017-04-06 ENCOUNTER — Telehealth (HOSPITAL_COMMUNITY): Payer: Self-pay

## 2017-04-06 DIAGNOSIS — R1310 Dysphagia, unspecified: Secondary | ICD-10-CM | POA: Diagnosis not present

## 2017-04-06 DIAGNOSIS — K219 Gastro-esophageal reflux disease without esophagitis: Secondary | ICD-10-CM | POA: Insufficient documentation

## 2017-04-06 DIAGNOSIS — Z955 Presence of coronary angioplasty implant and graft: Secondary | ICD-10-CM | POA: Diagnosis not present

## 2017-04-06 DIAGNOSIS — I214 Non-ST elevation (NSTEMI) myocardial infarction: Secondary | ICD-10-CM

## 2017-04-06 NOTE — Telephone Encounter (Signed)
UNABLE TO CONTACT PATIENT BY PHONE/  VOICE MAIL IS FULL. THIS CALL WAS IN REGARDS TO  REFERRAL FOR RHEUMATOLOGY. PATIENT HAS CANCELLED / NS FOR OTHER RHEUM. APPT.

## 2017-04-09 ENCOUNTER — Encounter (HOSPITAL_COMMUNITY)
Admission: RE | Admit: 2017-04-09 | Discharge: 2017-04-09 | Disposition: A | Discharge status: Home / Self Care | Payer: Medicare Other | Source: Ambulatory Visit | Attending: Cardiology | Admitting: Cardiology

## 2017-04-09 DIAGNOSIS — Z7689 Persons encountering health services in other specified circumstances: Secondary | ICD-10-CM | POA: Diagnosis not present

## 2017-04-09 DIAGNOSIS — Z955 Presence of coronary angioplasty implant and graft: Secondary | ICD-10-CM

## 2017-04-09 DIAGNOSIS — I214 Non-ST elevation (NSTEMI) myocardial infarction: Secondary | ICD-10-CM

## 2017-04-09 NOTE — Progress Notes (Signed)
Incomplete Session Note  Patient Details  Name: Alisha [name] Date MRN: [id number] Date of Birth: 06-22-1951 Referring Provider:     CARDIAC REHAB PHASE II ORIENTATION from 01/16/2017 in [place]  Referring Provider  Marijo File MD      Alisha [name] did not complete her rehab session.  Ms Magel reported that she had an asthma flare up this morning and had a nebulizer treatment. Upon assessment lung fields clear upon ascultation. Oxygen saturation 99% on room air. Blood pressure 122/76. Heart rate 67. Ms Casali did not bring her rescue inhaler with her today. Rhythm Sinus. I advised Ms Bancroft not exercise today. The patient was instructed to bring her rescue inhaler with her to each exercise session.Ms Broers states understanding and had no complaints upon exit from cardiac rehab.Barnet Pall, RN,BSN 04/09/2017 8:58 AM

## 2017-04-11 ENCOUNTER — Encounter (HOSPITAL_COMMUNITY)
Admission: RE | Admit: 2017-04-11 | Discharge: 2017-04-11 | Disposition: A | Discharge status: Home / Self Care | Payer: Medicare Other | Source: Ambulatory Visit | Attending: Cardiology | Admitting: Cardiology

## 2017-04-11 DIAGNOSIS — Z955 Presence of coronary angioplasty implant and graft: Secondary | ICD-10-CM

## 2017-04-11 DIAGNOSIS — I214 Non-ST elevation (NSTEMI) myocardial infarction: Secondary | ICD-10-CM

## 2017-04-11 DIAGNOSIS — Z7689 Persons encountering health services in other specified circumstances: Secondary | ICD-10-CM | POA: Diagnosis not present

## 2017-04-12 NOTE — Progress Notes (Signed)
Cardiac Individual Treatment Plan  Patient Details  Name: Alisha [name] MRN: [id number] Date of Birth: [birth year] Referring Provider:     CARDIAC REHAB PHASE II ORIENTATION from 01/16/2017 in [place]  Referring Provider  Marijo File MD      Initial Encounter Date:    CARDIAC REHAB PHASE II ORIENTATION from 01/16/2017 in [place] [facility]  Date  01/16/17  Referring Provider  Marijo File MD      Visit Diagnosis: 05/2016 NSTEMI (non-ST elevated myocardial infarction) ([place])  05/2016 Status post coronary artery stent placement  Patient's Home Medications on Admission:  Current Outpatient Prescriptions:  .  albuterol (PROVENTIL HFA;VENTOLIN HFA) 108 (90 Base) MCG/ACT inhaler, Inhale 2 puffs into the lungs every 6 (six) hours as needed for wheezing or shortness of breath., Disp: 1 Inhaler, Rfl: 6 .  albuterol (PROVENTIL) (2.5 MG/3ML) 0.083% nebulizer solution, Take 3 mLs (2.5 mg total) by nebulization every 6 (six) hours as needed for wheezing or shortness of breath., Disp: 150 mL, Rfl: 1 .  amLODipine (NORVASC) 10 MG tablet, Take 1 tablet (10 mg total) by mouth daily., Disp: 90 tablet, Rfl: 1 .  aspirin EC 81 MG tablet, Take 1 tablet (81 mg total) by mouth daily., Disp: 90 tablet, Rfl: 1 .  atorvastatin (LIPITOR) 20 MG tablet, Take 1 tablet (20 mg total) by mouth daily., Disp: 90 tablet, Rfl: 1 .  budesonide-formoterol (SYMBICORT) 160-4.5 MCG/ACT inhaler, Inhale 2 puffs into the lungs 2 (two) times daily., Disp: 1 Inhaler, Rfl: 6 .  esomeprazole (NEXIUM) 40 MG capsule, Take 1 capsule (40 mg total) by mouth daily., Disp: 30 capsule, Rfl: 3 .  furosemide (LASIX) 20 MG tablet, TAKE 2 TABLETS (40 MG TOTAL) BY MOUTH DAILY., Disp: 60 tablet, Rfl: 6 .  gabapentin (NEURONTIN) 600 MG tablet, Take 1 tablet (600 mg total) by mouth 3 (three) times daily., Disp: 90 tablet, Rfl: 3 .  guaiFENesin (MUCINEX) 600 MG 12 hr tablet, Take 1  tablet (600 mg total) by mouth 2 (two) times daily., Disp: 60 tablet, Rfl: 1 .  isosorbide dinitrate (ISORDIL) 30 MG tablet, Take 30 mg by mouth daily., Disp: , Rfl:  .  lisinopril (PRINIVIL,ZESTRIL) 40 MG tablet, Take 1 tablet (40 mg total) by mouth daily., Disp: 90 tablet, Rfl: 1 .  loratadine (CLARITIN) 10 MG tablet, Take 1 tablet (10 mg total) by mouth daily., Disp: 30 tablet, Rfl: 6 .  mirabegron ER (MYRBETRIQ) 25 MG TB24 tablet, Take 1 tablet (25 mg total) by mouth daily., Disp: 90 tablet, Rfl: 0 .  morphine (MS CONTIN) 15 MG 12 hr tablet, Take 1 tablet (15 mg total) by mouth daily., Disp: 30 tablet, Rfl: 0 .  nebivolol (BYSTOLIC) 5 MG tablet, Take 1 tablet (5 mg total) by mouth daily., Disp: 30 tablet, Rfl: 5 .  nitroGLYCERIN (NITROSTAT) 0.4 MG SL tablet, Place 1 tablet (0.4 mg total) under the tongue every 5 (five) minutes as needed for chest pain., Disp: 25 tablet, Rfl: 3 .  oxyCODONE-acetaminophen (ROXICET) 5-325 MG tablet, Take 1 tablet by mouth every 8 (eight) hours as needed for severe pain., Disp: 90 tablet, Rfl: 0 .  PRESCRIPTION MEDICATION, Place 1 application into the left eye at bedtime as needed (rash). Opthalmic ointment, Disp: , Rfl:  .  rizatriptan (MAXALT) 10 MG tablet, Take 1 tablet for headache.  May repeat once in 2 hours if needed.  Do not exceed 2 tablets in 24 hours., Disp: 10 tablet, Rfl:  3 .  sertraline (ZOLOFT) 25 MG tablet, TAKE 1 TABLET BY MOUTH EVERY DAY, Disp: 30 tablet, Rfl: 0 .  ticagrelor (BRILINTA) 90 MG TABS tablet, Take 1 tablet (90 mg total) by mouth 2 (two) times daily., Disp: 180 tablet, Rfl: 1  Past Medical History: Past Medical History:  Diagnosis Date  . Arthritis   . Asthma   . CAD (coronary artery disease) 07/07/2016   a. s/p stenting x 2 in [place] to the LAD, RCA is chronically occluded, LVEDP was 34  . Depression   . Fibromyalgia   . GERD (gastroesophageal reflux disease)   . History of benign pituitary tumor   . Hyperlipidemia   .  Hypertension   . Migraine   . Morbid obesity ([place])   . Vertigo     Tobacco Use: History  Smoking Status  . Never Smoker  Smokeless Tobacco  . Never Used    Labs: Recent Review Flowsheet Data    Labs for ITP Cardiac and Pulmonary Rehab Latest Ref Rng & Units 11/15/2016   Cholestrol <200 mg/dL 172   LDLCALC <100 mg/dL 105(H)   HDL >50 mg/dL 37(L)   Trlycerides <150 mg/dL 149      Capillary Blood Glucose: No results found for: GLUCAP   Exercise Target Goals:    Exercise Program Goal: Individual exercise prescription set with THRR, safety & activity barriers. Participant demonstrates ability to understand and report RPE using BORG scale, to self-measure pulse accurately, and to acknowledge the importance of the exercise prescription.  Exercise Prescription Goal: Starting with aerobic activity 30 plus minutes a day, 3 days per week for initial exercise prescription. Provide home exercise prescription and guidelines that participant acknowledges understanding prior to discharge.  Activity Barriers & Risk Stratification:     Activity Barriers & Cardiac Risk Stratification - 11/02/16 1146      Activity Barriers & Cardiac Risk Stratification   Activity Barriers Left Knee Replacement;Right Knee Replacement;Neck/Spine Problems;Back Problems;Arthritis;Joint Problems;Deconditioning;Muscular Weakness;Shortness of Breath;Balance Concerns;History of Falls;Assistive Device;Other (comment)   Comments B ankle fusion, L wrist surgery, B carpal tunnel surgery, L wrist cyst removal   Cardiac Risk Stratification High      6 Minute Walk:     6 Minute Walk    Row Name 11/02/16 1135 01/16/17 1010 01/16/17 1017     6 Minute Walk   Phase Initial  -  -   Distance 0.45 feet  step test  .45km 0.48 feet  km not feet... pt did a step test  -   Walk Time 6 minutes 6 minutes  -   # of Rest Breaks 0 0  -   MPH 0.27  -  -   METS 2 2  -   RPE 12 13  -   VO2 Peak 7  -  -   Symptoms No No  Yes (comment)   Comments  -  - R knee discomfort   Resting HR 62 bpm 57 bpm  -   Resting BP 110/72 120/70  -   Max Ex. HR 90 bpm 74 bpm  -   Max Ex. BP 126/70 142/60  -   2 Minute Post BP 110/74  -  -      Oxygen Initial Assessment:   Oxygen Re-Evaluation:   Oxygen Discharge (Final Oxygen Re-Evaluation):   Initial Exercise Prescription:     Initial Exercise Prescription - 01/16/17 1000      Date of Initial Exercise RX and Referring Provider   Date  01/16/17   Referring Provider Marijo File MD     NuStep   Level 3   SPM 80   Minutes 20   METs 1.8     Arm Ergometer   Level 1   Minutes 10   METs 1.3     Prescription Details   Frequency (times per week) 3   Duration Progress to 30 minutes of continuous aerobic without signs/symptoms of physical distress     Intensity   THRR 40-80% of Max Heartrate 61-122   Ratings of Perceived Exertion 11-13   Perceived Dyspnea 0-4     Progression   Progression Continue to progress workloads to maintain intensity without signs/symptoms of physical distress.     Resistance Training   Training Prescription Yes   Weight 2lbs   Reps 10-15      Perform Capillary Blood Glucose checks as needed.  Exercise Prescription Changes:     Exercise Prescription Changes    Row Name 01/30/17 1100 02/16/17 1602 03/09/17 1600 03/26/17 1600 04/10/17 1200     Response to Exercise   Blood Pressure (Admit) 156/70 124/62 128/82 122/60 140/84   Blood Pressure (Exercise) 182/70 120/80 160/76 114/60 160/84   Blood Pressure (Exit) 150/70 142/80 118/70 104/60 140/70   Heart Rate (Admit) 67 bpm 68 bpm 84 bpm 73 bpm 84 bpm   Heart Rate (Exercise) 95 bpm 117 bpm 134 bpm 126 bpm 96 bpm   Heart Rate (Exit) 64 bpm 92 bpm 70 bpm 71 bpm 84 bpm   Rating of Perceived Exertion (Exercise) _0 Symptoms none pt felt fatigue and throbbing sensation in head, lack of sleep none none none   Comments Pt is tolerating exercise very well  -  -  -   -   Duration Continue with 30 min of aerobic exercise without signs/symptoms of physical distress. Continue with 30 min of aerobic exercise without signs/symptoms of physical distress. Continue with 30 min of aerobic exercise without signs/symptoms of physical distress. Continue with 30 min of aerobic exercise without signs/symptoms of physical distress. Continue with 30 min of aerobic exercise without signs/symptoms of physical distress.   Intensity _1      Progression   Progression Continue to progress workloads to maintain intensity without signs/symptoms of physical distress. Continue to progress workloads to maintain intensity without signs/symptoms of physical distress. Continue to progress workloads to maintain intensity without signs/symptoms of physical distress. Continue to progress workloads to maintain intensity without signs/symptoms of physical distress. Continue to progress workloads to maintain intensity without signs/symptoms of physical distress.   Average METs 1.9 2.2 2.2 2.5 2.5     Resistance Training   Training Prescription _2    Weight  - 2lbs 3lbs 3lbs 3lbs   Reps 10-15 10-15 10-15 10-15 10-15   Time 10 Minutes 10 Minutes 10 Minutes 10 Minutes 10 Minutes     NuStep   Level _3 SPM 90 90 90 90 100   Minutes _4 METs 2.2 2.3 2.3 2.3 2.9     Arm Ergometer   Level _5 Minutes _6 METs 1.6 1._7 Track   Laps  - _8 -   Minutes  - _9 -   METs  -  2.39 2.59 2.74  -     Home Exercise Plan   Plans to continue exercise at  - Home (comment)  stationary bike and leg exercises for balance Home (comment)  stationary bike and leg exercises for balance Home (comment)  stationary bike and leg exercises for balance Home (comment)  stationary bike and leg exercises for balance   Frequency  - Add 2 additional days to program  exercise sessions. Add 2 additional days to program exercise sessions. Add 2 additional days to program exercise sessions. Add 2 additional days to program exercise sessions.   Initial Home Exercises Provided  - 02/16/17 02/16/17 02/16/17 02/16/17      Exercise Comments:     Exercise Comments    Row Name 02/13/17 1716 03/13/17 1701 03/26/17 1024 04/10/17 1238     Exercise Comments Reviewed METs and goals. Pt is tolerating exercise fairly well; will continue to monitor activty levels and exercise progression. Reviewed METs and goals. Pt is tolerating exercise fairly well; will continue to monitor activty levels and exercise progression. Reviewed METs and goals. Pt is tolerating exercise fairly well; will continue to monitor activty levels and exercise progression. Reviewed METs and goals. Pt is tolerating exercise fairly well; will continue to monitor activty levels and exercise progression.       Exercise Goals and Review:     Exercise Goals    Row Name 11/02/16 1148 01/16/17 0911 01/16/17 0912         Exercise Goals   Increase Physical Activity Yes  -  -     Intervention Provide advice, education, support and counseling about physical activity/exercise needs.;Develop an individualized exercise prescription for aerobic and resistive training based on initial evaluation findings, risk stratification, comorbidities and participant's personal goals. Provide advice, education, support and counseling about physical activity/exercise needs.;Develop an individualized exercise prescription for aerobic and resistive training based on initial evaluation findings, risk stratification, comorbidities and participant's personal goals.  -     Expected Outcomes Achievement of increased cardiorespiratory fitness and enhanced flexibility, muscular endurance and strength shown through measurements of functional capacity and personal statement of participant. Achievement of increased cardiorespiratory fitness  and enhanced flexibility, muscular endurance and strength shown through measurements of functional capacity and personal statement of participant.  -     Increase Strength and Stamina Yes Yes -  get back to water aerobics at [facility], improve balance, strengthen back muscles and posture     Intervention Develop an individualized exercise prescription for aerobic and resistive training based on initial evaluation findings, risk stratification, comorbidities and participant's personal goals.;Provide advice, education, support and counseling about physical activity/exercise needs. Develop an individualized exercise prescription for aerobic and resistive training based on initial evaluation findings, risk stratification, comorbidities and participant's personal goals.;Provide advice, education, support and counseling about physical activity/exercise needs.  -     Expected Outcomes Achievement of increased cardiorespiratory fitness and enhanced flexibility, muscular endurance and strength shown through measurements of functional capacity and personal statement of participant. Achievement of increased cardiorespiratory fitness and enhanced flexibility, muscular endurance and strength shown through measurements of functional capacity and personal statement of participant. -  Be able to improve walking tolerance without having to use scooter        Exercise Goals Re-Evaluation :     Exercise Goals Re-Evaluation    Row Name 02/13/17 1715 02/16/17 1538 03/13/17 1652 03/26/17 1024 04/10/17 1238     Exercise Goal Re-Evaluation   Exercise Goals Review Increase Physical Activity;Increase Strenth and  Stamina Increase Physical Activity;Increase Strenth and Stamina Increase Physical Activity;Increase Strenth and Stamina Increase Strenth and Stamina;Increase Physical Activity  -   Comments Pt is doing well with ambulating and be more active in cardiac rehab. Pt overall MET avg has increased from 2.1 to 2.4 Reviewed home  exercise with pt today.  Pt plans to walk for 10'( 1-2 bouts per day) and do leg exercises for balance and stability.  Reviewed THR, pulse, RPE, sign and symptoms, NTG use, and when to call 911 or MD.  Also discussed weather considerations and indoor options.  Pt voiced understanding. Pt plans to walk for 10'( 1-2 bouts per day) and do leg exercises for balance and stability.  pt has increased strength/stamina with increased walking distance.  pt is using motorized scooter less.  Pt has increased walking tolerance in cardiac rehab and can now walk for 10  minutes with very little breaks in between. Pt has also increased Nustep MET average. Pt is tolerating exercise very well and likes to challenge herself. Pt has increased walking tolerance in cardiac rehab and can now walk for 10  minutes with very little breaks in between. Pt has also increased Nustep MET average. Pt is tolerating exercise very well and likes to challenge herself.   Expected Outcomes Pt will continue to improve in cardiorespiratory fitness Pt will continue to improve in cardiorespiratory fitness Pt will continue to improve in cardiorespiratory fitness Pt will continue to improve in cardiorespiratory fitness Pt will continue to improve in cardiorespiratory fitness       Discharge Exercise Prescription (Final Exercise Prescription Changes):     Exercise Prescription Changes - 04/10/17 1200      Response to Exercise   Blood Pressure (Admit) 140/84   Blood Pressure (Exercise) 160/84   Blood Pressure (Exit) 140/70   Heart Rate (Admit) 84 bpm   Heart Rate (Exercise) 96 bpm   Heart Rate (Exit) 84 bpm   Rating of Perceived Exertion (Exercise) 12   Symptoms none   Duration Continue with 30 min of aerobic exercise without signs/symptoms of physical distress.   Intensity THRR unchanged     Progression   Progression Continue to progress workloads to maintain intensity without signs/symptoms of physical distress.   Average METs 2.5      Resistance Training   Training Prescription Yes   Weight 3lbs   Reps 10-15   Time 10 Minutes     NuStep   Level 3   SPM 100   Minutes 20   METs 2.9     Arm Ergometer   Level 1   Minutes 10   METs 2     Home Exercise Plan   Plans to continue exercise at Home (comment)  stationary bike and leg exercises for balance   Frequency Add 2 additional days to program exercise sessions.   Initial Home Exercises Provided 02/16/17      Nutrition:  Target Goals: Understanding of nutrition guidelines, daily intake of sodium <1521m, cholesterol <2050m calories 30% from fat and 7% or less from saturated fats, daily to have 5 or more servings of fruits and vegetables.  Biometrics:     Pre Biometrics - 01/16/17 1021      Pre Biometrics   % Body Fat 57.1 %       Nutrition Therapy Plan and Nutrition Goals:     Nutrition Therapy & Goals - 11/07/16 1514      Nutrition Therapy   Diet Therapeutic Lifestyle Changes  Personal Nutrition Goals   Nutrition Goal 1-2 lb wt loss per week to a wt loss goal of 6-24 lb at graduation from [place], educate and counsel regarding individualized specific dietary modifications aiming towards targeted core components such as weight, hypertension, lipid management, diabetes, heart failure and other comorbidities.   Expected Outcomes Short Term Goal: Understand basic principles of dietary content, such as calories, fat, sodium, cholesterol and nutrients.;Long Term Goal: Adherence to prescribed nutrition plan.      Nutrition Discharge: Nutrition Scores:     Nutrition Assessments - 03/12/17 0912      MEDFICTS Scores   Pre Score 36      Nutrition Goals Re-Evaluation:   Nutrition Goals Re-Evaluation:   Nutrition Goals Discharge (Final Nutrition Goals Re-Evaluation):   Psychosocial: Target Goals: Acknowledge presence or absence of significant depression and/or stress, maximize  coping skills, provide positive support system. Participant is able to verbalize types and ability to use techniques and skills needed for reducing stress and depression.  Initial Review & Psychosocial Screening:     Initial Psych Review & Screening - 01/16/17 1131      Initial Review   Current issues with Current Stress Concerns;Current Anxiety/Panic   Source of Stress Concerns Chronic Illness;Unable to participate in former interests or hobbies;Unable to perform yard/household activities;Financial;Transportation;Occupation   Comments pt with worsening deconditioning due to chronic illness notes health related anxiety is increasing.  pt eager to participate in CR activities to improve physical condition.      Family Dynamics   Good Support System? Yes  pt lives with her daughter   Comments Pt recently moved from [place] to [place] and is residing with her youngest daughter and grandchild.     Barriers   Psychosocial barriers to participate in program Psychosocial barriers identified (see note)      Quality of Life Scores:     Quality of Life - 03/28/17 1721      Quality of Life Scores   Health/Function Pre 10.75 %  health related anxiety improving with cardiac rehab participation. pt is relieved she has established care with new PCP.     Socioeconomic Pre 14.5 %  pt has been to job retraining program through [employer]. she is also applying for local volunteer positions.    Psych/Spiritual Pre 15.33 %   Family Pre 12.63 %   GLOBAL Pre 12.96 %  overall scores very low, however pt is looking for positive ways to cope with her struggles. pt offered emotional support and reassurance.  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      PHQ-9: Recent Review Flowsheet Data    Depression screen [facility] 2/9 04/04/2017 03/23/2017 03/09/2017 01/24/2017 11/20/2016   Decreased Interest 0 0 2 0 1   Down, Depressed, Hopeless 0 0 _0 PHQ - 2 Score 0 0 _1 Altered sleeping - - 2 - 3   Tired, decreased  energy - - 2 - 3   Change in appetite - - - - 0   Feeling bad or failure about yourself  - - 2 - 2   Trouble concentrating - - 2 - 2   Moving slowly or fidgety/restless - - 0 - 0   Suicidal thoughts - - 0 - 0   PHQ-9 Score - - 12 - 14   Difficult doing work/chores - - Very difficult - Very difficult     Interpretation of  Total Score  Total Score Depression Severity:  1-4 = Minimal depression, 5-9 = Mild depression, 10-14 = Moderate depression, 15-19 = Moderately severe depression, 20-27 = Severe depression   Psychosocial Evaluation and Intervention:     Psychosocial Evaluation - 02/14/17 1740      Psychosocial Evaluation & Interventions   Comments pt with health related anxiety/depression from physical deconditioning.  pt has recently moved from [place] to live with her daughter.  pt exhibits some degree of decreased anxiety as she regains more independence in her care.     Expected Outcomes pt will exhibit positive outlook with good coping skills.    Continue Psychosocial Services  Follow up required by staff      Psychosocial Re-Evaluation:     Psychosocial Re-Evaluation    [place] Name 03/12/17 1206 04/10/17 0932           Psychosocial Re-Evaluation   Current issues with Current Depression;History of Depression;Current Anxiety/Panic;Current Stress Concerns;Current Sleep Concerns Current Depression;History of Depression;Current Anxiety/Panic;Current Stress Concerns;Current Sleep Concerns      Comments pt reports increased depression symptoms.  pt would like to establish care with local counselor.   pt reports increased depression symptoms.  pt would like to establish care with local counselor.  appt with therapist has been arranged by [facility].  pt is encouraged that her medical needs are being addressed by her PCP.  pt is also very encouraged that she has been able to attend employment workshops and has started volunteer position with [employer].       Expected Outcomes pt will exhibit positive outlook with improved coping skills.  pt will exhibit positive outlook with improved coping skills.       Interventions Therapist referral;Stress management education;Relaxation education;Encouraged to attend Cardiac Rehabilitation for the exercise Therapist referral;Stress management education;Relaxation education;Encouraged to attend Cardiac Rehabilitation for the exercise      Continue Psychosocial Services  Follow up required by staff Follow up required by staff      Comments pt with worsening deconditioning due to chronic illness notes health related anxiety is increasing.  pt eager to participate in CR activities to improve physical condition.  pt with worsening deconditioning due to chronic illness notes health related anxiety is increasing.  pt eager to participate in CR activities to improve physical condition.         Initial Review   Source of Stress Concerns Poor Coping Skills;Chronic Illness;Unable to participate in former interests or hobbies;Transportation;Occupation Poor Radiographer, therapeutic;Chronic Illness;Unable to participate in former interests or hobbies;Transportation;Occupation         Psychosocial Discharge (Final Psychosocial Re-Evaluation):     Psychosocial Re-Evaluation - 04/10/17 0903      Psychosocial Re-Evaluation   Current issues with Current Depression;History of Depression;Current Anxiety/Panic;Current Stress Concerns;Current Sleep Concerns   Comments pt reports increased depression symptoms.  pt would like to establish care with local counselor.  appt with therapist has been arranged by Internal medicine clinic.  pt is encouraged that her medical needs are being addressed by her PCP.  pt is also very encouraged that she has been able to attend employment workshops and has started volunteer position with [employer].   Expected Outcomes pt will exhibit positive outlook with improved coping skills.    Interventions  Therapist referral;Stress management education;Relaxation education;Encouraged to attend Cardiac Rehabilitation for the exercise   Continue Psychosocial Services  Follow up required by staff   Comments pt with worsening deconditioning due to chronic illness notes  health related anxiety is increasing.  pt eager to participate in CR activities to improve physical condition.      Initial Review   Source of Stress Concerns Poor Coping Skills;Chronic Illness;Unable to participate in former interests or hobbies;Transportation;Occupation      Vocational Rehabilitation: Provide vocational rehab assistance to qualifying candidates.   Vocational Rehab Evaluation & Intervention:     Vocational Rehab - 01/16/17 1122      Initial Vocational Rehab Evaluation & Intervention   Assessment shows need for Vocational Rehabilitation Yes      Education: Education Goals: Education classes will be provided on a weekly basis, covering required topics. Participant will state understanding/return demonstration of topics presented.  Learning Barriers/Preferences:     Learning Barriers/Preferences - 01/16/17 0830      Learning Barriers/Preferences   Learning Barriers Sight   Learning Preferences Audio;Written Material;Verbal Instruction;Video      Education Topics: Count Your Pulse:  -Group instruction provided by verbal instruction, demonstration, patient participation and written materials to support subject.  Instructors address importance of being able to find your pulse and how to count your pulse when at home without a heart monitor.  Patients get hands on experience counting their pulse with staff help and individually.   CARDIAC REHAB PHASE II EXERCISE from 04/11/2017 in [place]  Date  01/26/17  Instruction Review Code  2- meets goals/outcomes      Heart Attack, Angina, and Risk Factor Modification:  -Group instruction provided by verbal instruction, video,  and written materials to support subject.  Instructors address signs and symptoms of angina and heart attacks.    Also discuss risk factors for heart disease and how to make changes to improve heart health risk factors.   Functional Fitness:  -Group instruction provided by verbal instruction, demonstration, patient participation, and written materials to support subject.  Instructors address safety measures for doing things around the house.  Discuss how to get up and down off the floor, how to pick things up properly, how to safely get out of a chair without assistance, and balance training.   CARDIAC REHAB PHASE II EXERCISE from 04/11/2017 in [place]  Date  11/10/16  Educator  [place]  Instruction Review Code  2- meets goals/outcomes      Meditation and Mindfulness:  -Group instruction provided by verbal instruction, patient participation, and written materials to support subject.  Instructor addresses importance of mindfulness and meditation practice to help reduce stress and improve awareness.  Instructor also leads participants through a meditation exercise.    CARDIAC REHAB PHASE II EXERCISE from 04/11/2017 in [place]  Date  04/11/17  Instruction Review Code  2- meets goals/outcomes      Stretching for Flexibility and Mobility:  -Group instruction provided by verbal instruction, patient participation, and written materials to support subject.  Instructors lead participants through series of stretches that are designed to increase flexibility thus improving mobility.  These stretches are additional exercise for major muscle groups that are typically performed during regular warm up and cool down.   CARDIAC REHAB PHASE II EXERCISE from 04/11/2017 in [place]  Date  03/23/17  Educator  EP  Instruction Review Code  R- Review/reinforce      Hands Only CPR:  -Group  verbal, video, and participation provides a basic overview of AHA guidelines for community CPR. Role-play of emergencies allow participants the opportunity to  practice calling for help and chest compression technique with discussion of AED use.   Hypertension: -Group verbal and written instruction that provides a basic overview of hypertension including the most recent diagnostic guidelines, risk factor reduction with self-care instructions and medication management.    Nutrition I class: Heart Healthy Eating:  -Group instruction provided by PowerPoint slides, verbal discussion, and written materials to support subject matter. The instructor gives an explanation and review of the Therapeutic Lifestyle Changes diet recommendations, which includes a discussion on lipid goals, dietary fat, sodium, fiber, plant stanol/sterol esters, sugar, and the components of a well-balanced, healthy diet.   CARDIAC REHAB PHASE II EXERCISE from 04/11/2017 in [place]  Date  03/12/17  Educator  RD  Instruction Review Code  Not applicable      Nutrition II class: Lifestyle Skills:  -Group instruction provided by PowerPoint slides, verbal discussion, and written materials to support subject matter. The instructor gives an explanation and review of label reading, grocery shopping for heart health, heart healthy recipe modifications, and ways to make healthier choices when eating out.   CARDIAC REHAB PHASE II EXERCISE from 04/11/2017 in [place]  Date  03/12/17  Educator  RD  Instruction Review Code  Not applicable      Diabetes Question & Answer:  -Group instruction provided by PowerPoint slides, verbal discussion, and written materials to support subject matter. The instructor gives an explanation and review of diabetes co-morbidities, pre- and post-prandial blood glucose goals, pre-exercise blood glucose goals, signs, symptoms, and treatment of  hypoglycemia and hyperglycemia, and foot care basics.   CARDIAC REHAB PHASE II EXERCISE from 04/11/2017 in [place]  Date  03/09/17  Educator  RD  Instruction Review Code  2- meets goals/outcomes      Diabetes Blitz:  -Group instruction provided by PowerPoint slides, verbal discussion, and written materials to support subject matter. The instructor gives an explanation and review of the physiology behind type 1 and type 2 diabetes, diabetes medications and rational behind using different medications, pre- and post-prandial blood glucose recommendations and Hemoglobin A1c goals, diabetes diet, and exercise including blood glucose guidelines for exercising safely.    Portion Distortion:  -Group instruction provided by PowerPoint slides, verbal discussion, written materials, and food models to support subject matter. The instructor gives an explanation of serving size versus portion size, changes in portions sizes over the last 20 years, and what consists of a serving from each food group.   CARDIAC REHAB PHASE II EXERCISE from 04/11/2017 in [place]  Date  02/14/17  Educator  RD  Instruction Review Code  2- meets goals/outcomes      Stress Management:  -Group instruction provided by verbal instruction, video, and written materials to support subject matter.  Instructors review role of stress in heart disease and how to cope with stress positively.     Exercising on Your Own:  -Group instruction provided by verbal instruction, power point, and written materials to support subject.  Instructors discuss benefits of exercise, components of exercise, frequency and intensity of exercise, and end points for exercise.  Also discuss use of nitroglycerin and activating EMS.  Review options of places to exercise outside of rehab.  Review guidelines for sex with heart disease.   CARDIAC REHAB PHASE II EXERCISE from 04/11/2017 in [place]  Date  03/14/17  Instruction Review Code  2- meets  goals/outcomes      Cardiac Drugs I:  -Group instruction provided by verbal instruction and written materials to support subject.  Instructor reviews cardiac drug classes: antiplatelets, anticoagulants, beta blockers, and statins.  Instructor discusses reasons, side effects, and lifestyle considerations for each drug class.   CARDIAC REHAB PHASE II EXERCISE from 04/11/2017 in [place]  Date  04/06/17  Educator  Kennyth Lose  Instruction Review Code  2- meets goals/outcomes      Cardiac Drugs II:  -Group instruction provided by verbal instruction and written materials to support subject.  Instructor reviews cardiac drug classes: angiotensin converting enzyme inhibitors (ACE-I), angiotensin II receptor blockers (ARBs), nitrates, and calcium channel blockers.  Instructor discusses reasons, side effects, and lifestyle considerations for each drug class.   CARDIAC REHAB PHASE II EXERCISE from 04/11/2017 in [place]  Date  02/07/17  Educator  Kennyth Lose  Instruction Review Code  2- meets goals/outcomes      Anatomy and Physiology of the Circulatory System:  Group verbal and written instruction and models provide basic cardiac anatomy and physiology, with the coronary electrical and arterial systems. Review of: AMI, Angina, Valve disease, Heart Failure, Peripheral Artery Disease, Cardiac Arrhythmia, Pacemakers, and the ICD.   CARDIAC REHAB PHASE II EXERCISE from 04/11/2017 in [place]  Date  03/28/17  Instruction Review Code  2- meets goals/outcomes      Other Education:  -Group or individual verbal, written, or video instructions that support the educational goals of the cardiac rehab program.   Knowledge Questionnaire Score:     Knowledge Questionnaire Score - 01/16/17 1030      Knowledge Questionnaire Score    Pre Score 12/24      Core Components/Risk Factors/Patient Goals at Admission:     Personal Goals and Risk Factors at Admission - 01/16/17 1127      Core Components/Risk Factors/Patient Goals on Admission    Weight Management Obesity   Intervention Obesity: Provide education and appropriate resources to help participant work on and attain dietary goals.;Weight Management/Obesity: Establish reasonable short term and long term weight goals.;Weight Management: Develop a combined nutrition and exercise program designed to reach desired caloric intake, while maintaining appropriate intake of nutrient and fiber, sodium and fats, and appropriate energy expenditure required for the weight goal.;Weight Management: Provide education and appropriate resources to help participant work on and attain dietary goals.   Expected Outcomes Short Term: Continue to assess and modify interventions until short term weight is achieved;Long Term: Adherence to nutrition and physical activity/exercise program aimed toward attainment of established weight goal;Weight Loss: Understanding of general recommendations for a balanced deficit meal plan, which promotes 1-2 lb weight loss per week and includes a negative energy balance of [phone] kcal/d;Understanding recommendations for meals to include 15-35% energy as protein, 25-35% energy from fat, 35-60% energy from carbohydrates, less than 284m of dietary cholesterol, 20-35 gm of total fiber daily;Understanding of distribution of calorie intake throughout the day with the consumption of 4-5 meals/snacks;Weight Maintenance: Understanding of the daily nutrition guidelines, which includes 25-35% calories from fat, 7% or less cal from saturated fats, less than 2090mcholesterol, less than 1.5gm of sodium, & 5 or more servings of fruits and vegetables daily   Improve shortness of breath with ADL's Yes   Intervention Provide education, individualized exercise plan and daily activity  instruction to help decrease symptoms of SOB with activities of daily living.   Expected Outcomes Short Term:  Achieves a reduction of symptoms when performing activities of daily living.   Hypertension Yes   Intervention Provide education on lifestyle modifcations including regular physical activity/exercise, weight management, moderate sodium restriction and increased consumption of fresh fruit, vegetables, and low fat dairy, alcohol moderation, and smoking cessation.;Monitor prescription use compliance.   Expected Outcomes Short Term: Continued assessment and intervention until BP is < 140/25m HG in hypertensive participants. < 130/851mHG in hypertensive participants with diabetes, heart failure or chronic kidney disease.;Long Term: Maintenance of blood pressure at goal levels.   Lipids Yes   Intervention Provide education and support for participant on nutrition & aerobic/resistive exercise along with prescribed medications to achieve LDL <7044mHDL >94m43m Expected Outcomes Short Term: Participant states understanding of desired cholesterol values and is compliant with medications prescribed. Participant is following exercise prescription and nutrition guidelines.;Long Term: Cholesterol controlled with medications as prescribed, with individualized exercise RX and with personalized nutrition plan. Value goals: LDL < 70mg24mL > 40 mg.   Stress Yes   Intervention Offer individual and/or small group education and counseling on adjustment to heart disease, stress management and health-related lifestyle change. Teach and support self-help strategies.;Refer participants experiencing significant psychosocial distress to appropriate mental health specialists for further evaluation and treatment. When possible, include family members and significant others in education/counseling sessions.   Personal Goal Other Yes   Personal Goal [facility], strengthen back muscles  and posture   Intervention Provide exercise programming to assist with building exercise tolerance and aerobic capacity.   Expected Outcomes Pt will build on aerobic capacity and get back to water aerobics.      Core Components/Risk Factors/Patient Goals Review:      Goals and Risk Factor Review    Row Name 02/14/17 1737 03/12/17 1204 04/10/17 0903         Core Components/Risk Factors/Patient Goals Review   Personal Goals Review Weight Management/Obesity;Improve shortness of breath with ADL's;Hypertension;Lipids;Stress;Other Weight Management/Obesity;Improve shortness of breath with ADL's;Hypertension;Lipids;Stress;Other Weight Management/Obesity;Improve shortness of breath with ADL's;Hypertension;Lipids;Stress;Other     Review pt with multiple CAD risk factors displays eagerness to participate in CR program, however pt does have frequent absences.  pt is also eager to establish care with new PCP, sleep apnea specialist and pulmonlogist.  referrrals have been placed.  pt with multiple CAD risk factors displays eagerness to participate in CR program, however pt does have frequent absences.  pt has upcoming appt with internal medicine clinic to establish care.  pt with multiple CAD risk factors displays eagerness to participate in CR program, however pt does have frequent absences.  pt encouraged by recent IM appt and feels her needs are being addressed by new PCP.       Expected Outcomes pt will continue participation in CR exercise, nutrition and lifestyle education offerings to decrease overall risk factors.  pt will continue participation in CR exercise, nutrition and lifestyle education offerings to decrease overall risk factors.  pt will continue participation in CR exercise, nutrition and lifestyle education offerings to decrease overall risk factors.         Core Components/Risk Factors/Patient Goals at Discharge (Final Review):      Goals and Risk Factor Review - 04/10/17 0903       Core Components/Risk Factors/Patient Goals Review   Personal Goals Review Weight Management/Obesity;Improve shortness of breath with ADL's;Hypertension;Lipids;Stress;Other   Review pt with multiple CAD risk factors displays eagerness to participate in CR program, however pt  does have frequent absences.  pt encouraged by recent IM appt and feels her needs are being addressed by new PCP.     Expected Outcomes pt will continue participation in CR exercise, nutrition and lifestyle education offerings to decrease overall risk factors.       ITP Comments:     ITP Comments    Row Name 11/02/16 1127 01/16/17 0828 02/13/17 1531 03/12/17 1203 03/13/17 1611   ITP Comments Dr. Fransico Him, Medical Director Dr. Fransico Him, Medical Director  Dr. Fransico Him, Medical Director  Dr. Fransico Him, Medical Director  Dr. Fransico Him, Medical Director    [place] Name 04/12/17 [phone]           ITP Comments Dr. Fransico Him, Medical Director           Comments: Pt is making expected progress toward personal goals after completing 17 sessions. Recommend continued exercise and life style modification education including  stress management and relaxation techniques to decrease cardiac risk profile.

## 2017-04-13 ENCOUNTER — Encounter (HOSPITAL_COMMUNITY)
Admission: RE | Admit: 2017-04-13 | Discharge: 2017-04-13 | Disposition: A | Discharge status: Home / Self Care | Payer: Medicare Other | Source: Ambulatory Visit | Attending: Cardiology | Admitting: Cardiology

## 2017-04-13 DIAGNOSIS — Z7689 Persons encountering health services in other specified circumstances: Secondary | ICD-10-CM | POA: Diagnosis not present

## 2017-04-13 DIAGNOSIS — Z955 Presence of coronary angioplasty implant and graft: Secondary | ICD-10-CM | POA: Diagnosis not present

## 2017-04-13 DIAGNOSIS — I214 Non-ST elevation (NSTEMI) myocardial infarction: Secondary | ICD-10-CM

## 2017-04-16 ENCOUNTER — Telehealth: Payer: Self-pay | Admitting: Cardiology

## 2017-04-16 ENCOUNTER — Encounter (HOSPITAL_COMMUNITY)
Admission: RE | Admit: 2017-04-16 | Discharge: 2017-04-16 | Disposition: A | Discharge status: Home / Self Care | Payer: Medicare Other | Source: Ambulatory Visit | Attending: Cardiology | Admitting: Cardiology

## 2017-04-16 ENCOUNTER — Telehealth (HOSPITAL_COMMUNITY): Payer: Self-pay | Admitting: Cardiac Rehabilitation

## 2017-04-16 DIAGNOSIS — I214 Non-ST elevation (NSTEMI) myocardial infarction: Secondary | ICD-10-CM

## 2017-04-16 DIAGNOSIS — Z7689 Persons encountering health services in other specified circumstances: Secondary | ICD-10-CM | POA: Diagnosis not present

## 2017-04-16 DIAGNOSIS — Z955 Presence of coronary angioplasty implant and graft: Secondary | ICD-10-CM

## 2017-04-16 NOTE — Telephone Encounter (Signed)
Informed pt encounter reviewed by [name] PA and he agreed w med increase recommendations. She will follow advice as given, & update [place] and/or cardiac rehab staff on Wednesday.

## 2017-04-16 NOTE — Telephone Encounter (Signed)
Sounds reasonable, increase lasix to 80mg  daily for 2 days before dropping down to 40mg  daily. Let us know if weight remain elevated

## 2017-04-16 NOTE — Progress Notes (Signed)
Incomplete Session Note  Patient Details  Name: Alisha [name] MRN: [id number] Date of Birth: [birth year] Referring Provider:     CARDIAC REHAB PHASE II ORIENTATION from 01/16/2017 in [place]  Referring Provider  Marijo File MD      [name] did not complete her rehab session.  Pt c/o dyspnea with wheezing and headache. Pt used Proventil inhaler with relief of wheezing.  Lungs diminished scattered wheezes, O2 sat-100% RA, 1+ edema bilateral lower extremities.  Pt weight up 5kg. Pt admits to missed diuretic doses this weekend.   Pt reports medication was in her daughters car.   Pt also admits to eating out frequently this weekend with her family.  pc to Dr. Cherlyn Cushing office to notify. LM of triage nurse to contact pt . Pt discharged home with [employer] transportation. Pt instructed to notify MD if symptoms persist or worsen.  Understanding verbalized. Andi Hence, RN, BSN  Cardiac Pulmonary Rehab

## 2017-04-16 NOTE — Telephone Encounter (Signed)
-----   Message from Theodore Demark, RN sent at 04/16/2017  3:02 PM EDT ----- Regarding: RE: cardiac rehab  No problem.  I'll have her update me on Wednesday on how she's doing, and I've sent the note to [facility], who saw her recently for OV, for further suggestions.  Thanks, Ovid Curd  ----- Message ----- From: Lowell Guitar, RN Sent: 04/16/2017   2:59 PM To: Theodore Demark, RN Subject: cardiac rehab                                  Dear Ovid Curd,  I got the message you called me back. I also saw that you spoke pt and increased her lasix.  Thank you so much for your assistance with her.  Please let me know if you need anymore information from me  Thanks! Andi Hence, RN, BSN Cardiac Pulmonary Rehab

## 2017-04-16 NOTE — Telephone Encounter (Addendum)
I spoke w Alisha [name].  She explains she missed her doses of lasix over the weekend because she didn't have medication on-hand, it was misplaced before she went out of town. Also went out to eat 2 nights.  Her daughter has her medication and is dropping it off this afternoon. Since she missed dose again this AM she'll take her standard lasix dose of 40mg  this evening.  Pt voices concern for LE edema, weight gain of 8-9 lbs; she denies SOB, fatigue, discomfort, dizziness, etc.  Last labwork OK (recent CMET and CBC in April) I've advised her to go ahead and increase the medication to 80mg  in the AM x2 days. (based on her past experience, she states she would prefer to double daily dose rather than doing 40mg  BID)  Pt of Dr. Percival Spanish. She recently saw Isaac Laud in office, I informed her I'd speak w him about any further recommendations (may need f/u labs) and call w advice - otherwise plan to follow increases, track weights, and call us Wednesday w progress report. Pt voiced understanding & thanks.

## 2017-04-16 NOTE — Telephone Encounter (Signed)
Left msg w cardiac rehab requesting call back.

## 2017-04-16 NOTE — Telephone Encounter (Signed)
New message       Pt c/o medication issue:  1. Name of Medication:  furosemide 2. How are you currently taking this medication (dosage and times per day)? 20mg  3. Are you having a reaction (difficulty breathing--STAT)?  no 4. What is your medication issue?  Today at cardiac rehab, nurse reports pts weight has increased 12lbs, sob, wheezing and edema in legs.  Pt admit to forgetting to take sat and sun medication and she went out to eat both days.  Nurse is wondering if pt missed more days of medication.  Pt was able to do a partial section of exercise today before she started having sob.  Cardiac rehab nurse want to know if pt should increase her medication?

## 2017-04-18 ENCOUNTER — Encounter (HOSPITAL_COMMUNITY)
Admission: RE | Admit: 2017-04-18 | Discharge: 2017-04-18 | Disposition: A | Discharge status: Home / Self Care | Payer: Medicare Other | Source: Ambulatory Visit | Attending: Cardiology | Admitting: Cardiology

## 2017-04-19 ENCOUNTER — Other Ambulatory Visit: Payer: Self-pay | Admitting: Cardiology

## 2017-04-20 ENCOUNTER — Encounter (HOSPITAL_COMMUNITY)
Admission: RE | Admit: 2017-04-20 | Discharge: 2017-04-20 | Disposition: A | Discharge status: Home / Self Care | Payer: Medicare Other | Source: Ambulatory Visit | Attending: Cardiology | Admitting: Cardiology

## 2017-04-20 DIAGNOSIS — Z955 Presence of coronary angioplasty implant and graft: Secondary | ICD-10-CM

## 2017-04-20 DIAGNOSIS — I214 Non-ST elevation (NSTEMI) myocardial infarction: Secondary | ICD-10-CM

## 2017-04-23 ENCOUNTER — Other Ambulatory Visit: Payer: Medicare Other

## 2017-04-23 ENCOUNTER — Encounter: Payer: Self-pay | Admitting: Neurology

## 2017-04-23 ENCOUNTER — Encounter (HOSPITAL_COMMUNITY)
Admission: RE | Admit: 2017-04-23 | Discharge: 2017-04-23 | Disposition: A | Discharge status: Home / Self Care | Payer: Medicare Other | Source: Ambulatory Visit | Attending: Cardiology | Admitting: Cardiology

## 2017-04-23 ENCOUNTER — Ambulatory Visit (INDEPENDENT_AMBULATORY_CARE_PROVIDER_SITE_OTHER): Payer: Medicare Other | Admitting: Neurology

## 2017-04-23 ENCOUNTER — Encounter: Payer: Self-pay | Admitting: *Deleted

## 2017-04-23 VITALS — BP 124/68 | HR 66 | Ht 66.0 in | Wt 295.2 lb

## 2017-04-23 DIAGNOSIS — G43009 Migraine without aura, not intractable, without status migrainosus: Secondary | ICD-10-CM

## 2017-04-23 DIAGNOSIS — R51 Headache: Secondary | ICD-10-CM

## 2017-04-23 DIAGNOSIS — Z955 Presence of coronary angioplasty implant and graft: Secondary | ICD-10-CM | POA: Diagnosis not present

## 2017-04-23 DIAGNOSIS — H53451 Other localized visual field defect, right eye: Secondary | ICD-10-CM

## 2017-04-23 DIAGNOSIS — D352 Benign neoplasm of pituitary gland: Secondary | ICD-10-CM | POA: Diagnosis not present

## 2017-04-23 DIAGNOSIS — R519 Headache, unspecified: Secondary | ICD-10-CM

## 2017-04-23 DIAGNOSIS — I214 Non-ST elevation (NSTEMI) myocardial infarction: Secondary | ICD-10-CM

## 2017-04-23 MED ORDER — TIZANIDINE HCL 4 MG PO TABS: 4.0000 mg | ORAL_TABLET | Freq: Every day | ORAL | 3 refills | Status: DC

## 2017-04-23 NOTE — Patient Instructions (Addendum)
1.  I will start you on tizanidine 4mg  at bedtime.  It is a muscle relaxant that may help the headache.  It may cause drowsiness, which is why you are to take it at bedtime. 2.  We will refer you again to ophthalmologist 3.  We will check a Sed Rate 4.  Follow up in 4 months.

## 2017-04-23 NOTE — Progress Notes (Signed)
NEUROLOGY FOLLOW UP OFFICE NOTE  Alisha [name] [id number]  HISTORY OF PRESENT ILLNESS: Alisha [name] is a 66 year old right-handed female with asthma, CAD, fibromyalgia, hypertension, and depression who follows up for pituitary adenoma.  UPDATE: On exam, she exhibited decreased temporal vision loss in right eye. MRI of brain and pituitary with and without contrast from 11/13/16 was personally reviewed and revealed chronic post-surgical right frontal and temporal encephalomalcia and previous resection of pituitary mass without residual or recurrent tumor.    She was referred to ophthalmology but an appointment was never set up. For several years, she has had occasional right temporal headache.  However, it has become more intense and frequent over the past couple of months (no specific trigger or relieving factors).  It is a pounding pain lasting 2 to 3 minutes and occurring 3 to 4 days a week. No migraines since last visit.  Current NSAIDS:  no Current analgesics:  MS Contin, Percocet, Tylenol Current triptans:  Maxalt 10mg  Current anti-emetic:  no Current muscle relaxants:  no Current anti-anxiolytic:  no Current sleep aide:  no Current Antihypertensive medications:  Toprol, Lasix, amlodipine, lisinopril Current Antidepressant medications:  sertraline 25mg  Current Anticonvulsant medications:  Gabapentin 600mg  three times daily Current Vitamins/Herbal/Supplements:  no Current Antihistamines/Decongestants:  Claritin  HISTORY: In the mid-1990s, she began experiencing headaches.  She reported difficulty with seeing the eye chart on physical exams.  In 1999, she was found to have peripheral vision loss by an eye doctor.  She had an MRI of the brain which revealed a pituitary mass that was compressing the optic nerve.  Biopsy confirmed it to be benign.  She underwent 3 surgeries, including transphenoidal surgery.  She underwent radiation about 4 years ago.  She has some residual  peripheral vision loss in the right eye.  She would periodically have repeat MRI of brain performed and regular visual field testing.   She has migraine headaches.  They are "15"/10 intensity, pounding bi-temporal and occipital headache, associated with nausea, photophobia and phonophobia.  They last 1 to 2 days and occur about 4 or 5 times a year.  Stress is a trigger.  Maxalt relieves it.  If Maxalt doesn't work, she goes to the ED.  She also has dull daily headaches as well.   She also has history of vertigo and falls.   She has fibromyalgia and depression, and takes multiple medications, including chronic opioid use.  Past medication:  Amitriptyline (side effects)  PAST MEDICAL HISTORY: Past Medical History:  Diagnosis Date  . Arthritis   . Asthma   . CAD (coronary artery disease) 07/07/2016   a. s/p stenting x 2 in [place] to the LAD, RCA is chronically occluded, LVEDP was 34  . Depression   . Fibromyalgia   . GERD (gastroesophageal reflux disease)   . History of benign pituitary tumor   . Hyperlipidemia   . Hypertension   . Migraine   . Morbid obesity ([place])   . Vertigo     MEDICATIONS: Current Outpatient Prescriptions on File Prior to Visit  Medication Sig Dispense Refill  . albuterol (PROVENTIL HFA;VENTOLIN HFA) 108 (90 Base) MCG/ACT inhaler Inhale 2 puffs into the lungs every 6 (six) hours as needed for wheezing or shortness of breath. 1 Inhaler 6  . albuterol (PROVENTIL) (2.5 MG/3ML) 0.083% nebulizer solution Take 3 mLs (2.5 mg total) by nebulization every 6 (six) hours as needed for wheezing or shortness of breath. 150 mL 1  . amLODipine (NORVASC) 10 MG  tablet Take 1 tablet (10 mg total) by mouth daily. 90 tablet 1  . aspirin 81 MG EC tablet TAKE 1 TABLET BY MOUTH EVERY DAY 90 tablet 1  . atorvastatin (LIPITOR) 20 MG tablet Take 1 tablet (20 mg total) by mouth daily. 90 tablet 1  . budesonide-formoterol (SYMBICORT) 160-4.5 MCG/ACT inhaler Inhale 2 puffs into the lungs 2  (two) times daily. 1 Inhaler 6  . esomeprazole (NEXIUM) 40 MG capsule Take 1 capsule (40 mg total) by mouth daily. 30 capsule 3  . furosemide (LASIX) 20 MG tablet TAKE 2 TABLETS (40 MG TOTAL) BY MOUTH DAILY. 60 tablet 6  . gabapentin (NEURONTIN) 600 MG tablet Take 1 tablet (600 mg total) by mouth 3 (three) times daily. 90 tablet 3  . guaiFENesin (MUCINEX) 600 MG 12 hr tablet Take 1 tablet (600 mg total) by mouth 2 (two) times daily. 60 tablet 1  . isosorbide dinitrate (ISORDIL) 30 MG tablet Take 30 mg by mouth daily.    Marland Kitchen lisinopril (PRINIVIL,ZESTRIL) 40 MG tablet Take 1 tablet (40 mg total) by mouth daily. 90 tablet 1  . loratadine (CLARITIN) 10 MG tablet Take 1 tablet (10 mg total) by mouth daily. 30 tablet 6  . mirabegron ER (MYRBETRIQ) 25 MG TB24 tablet Take 1 tablet (25 mg total) by mouth daily. 90 tablet 0  . morphine (MS CONTIN) 15 MG 12 hr tablet Take 1 tablet (15 mg total) by mouth daily. 30 tablet 0  . nebivolol (BYSTOLIC) 5 MG tablet Take 1 tablet (5 mg total) by mouth daily. 30 tablet 5  . nitroGLYCERIN (NITROSTAT) 0.4 MG SL tablet Place 1 tablet (0.4 mg total) under the tongue every 5 (five) minutes as needed for chest pain. 25 tablet 3  . oxyCODONE-acetaminophen (ROXICET) 5-325 MG tablet Take 1 tablet by mouth every 8 (eight) hours as needed for severe pain. 90 tablet 0  . PRESCRIPTION MEDICATION Place 1 application into the left eye at bedtime as needed (rash). Opthalmic ointment    . rizatriptan (MAXALT) 10 MG tablet Take 1 tablet for headache.  May repeat once in 2 hours if needed.  Do not exceed 2 tablets in 24 hours. 10 tablet 3  . sertraline (ZOLOFT) 25 MG tablet TAKE 1 TABLET BY MOUTH EVERY DAY 30 tablet 0  . ticagrelor (BRILINTA) 90 MG TABS tablet Take 1 tablet (90 mg total) by mouth 2 (two) times daily. 180 tablet 1   No current facility-administered medications on file prior to visit.     ALLERGIES: Allergies  Allergen Reactions  . Ampicillin Nausea Only  . Asa  [Aspirin] Diarrhea  . Darvon [Propoxyphene] Nausea And Vomiting  . Eggs Or Egg-Derived Products Diarrhea  . Milk-Related Compounds Diarrhea  . Salicylates Other (See Comments)    Unknown   . Tetracyclines & Related Other (See Comments)    Nerves feeling     FAMILY HISTORY: Family History  Problem Relation Age of Onset  . Heart disease Mother 43       CABG, pacemaker, valve  . Cancer Father   . Heart disease Sister        Fluid around the heart    SOCIAL HISTORY: Social History   Social History  . Marital status: Divorced    Spouse name: N/A  . Number of children: N/A  . Years of education: N/A   Occupational History  . Not on file.   Social History Main Topics  . Smoking status: Never Smoker  . Smokeless tobacco: Never  Used  . Alcohol use Yes     Comment: Socially. [name].  . Drug use: No  . Sexual activity: No   Other Topics Concern  . Not on file   Social History Narrative   Current Social History        Who lives at home: Lives with youngest daughter, "Olivia Mackie" 09/25/2016    Transportation: Public 02/26/7792   Important Relationships & Pets: 2 daughters (52, 93), 1 son (65), 10 grandchildren, 4 great grandchildren 09/25/2016    Current Stressors: Recently moved from [place], [place] due to threat of NH placement 09/25/2016   Religious / Personal Beliefs: Christian 09/25/2016   Interests / Fun: Dancing 09/25/2016   Other: Participates in [employer] 09/25/2016       REVIEW OF SYSTEMS: Constitutional: No fevers, chills, or sweats, no generalized fatigue, change in appetite Eyes: No visual changes, double vision, eye pain Ear, nose and throat: No hearing loss, ear pain, nasal congestion, sore throat Cardiovascular: No chest pain, palpitations Respiratory:  No shortness of breath at rest or with exertion, wheezes GastrointestinaI: No nausea, vomiting, diarrhea, abdominal pain, fecal incontinence Genitourinary:  No dysuria, urinary retention or  frequency Musculoskeletal:  No neck pain, back pain Integumentary: No rash, pruritus, skin lesions Neurological: as above Psychiatric: No depression, insomnia, anxiety Endocrine: No palpitations, fatigue, diaphoresis, mood swings, change in appetite, change in weight, increased thirst Hematologic/Lymphatic:  No purpura, petechiae. Allergic/Immunologic: no itchy/runny eyes, nasal congestion, recent allergic reactions, rashes  PHYSICAL EXAM: Vitals:   04/23/17 0935  BP: 124/68  Pulse: 66  SpO2: 100%   General: No acute distress.  Patient appears well-groomed.   Head:  Normocephalic/atraumatic, tenderness to palpation of the right lower temporal region, anterior to ear. Eyes:  Fundi examined but not visualized Neck: supple, no paraspinal tenderness, full range of motion Heart:  Regular rate and rhythm Lungs:  Clear to auscultation bilaterally Back: No paraspinal tenderness Neurological Exam: alert and oriented to person, place, and time. Attention span and concentration intact, recent and remote memory intact, fund of knowledge intact.  Speech fluent and not dysarthric, language intact.  Decreased temporal vision loss in right eye.  Otherwise, CN II-XII intact. Bulk and tone normal, muscle strength 5/5 throughout.  Sensation to light touch intact.  Deep tendon reflexes absent throughout.  Finger to nose intact.  Antalgic gait.  IMPRESSION: 1.  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  Migraines, stable 3.  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  Right temporal headache, chronic but increased.   5.  Morbid obesity (BMI 47.65) 6.  Medication overuse headache  PLAN: 1.  Will start tizanidine 4mg  at bedtime to help with right temporal tenderness 2.  Check Sed Rate 3.  Refer again to ophthalmology for visual field testing. 4.  Weight loss 5.  Follow up in 4 months.  Metta Clines, DO  CC:  Joni Reining, DO

## 2017-04-24 ENCOUNTER — Telehealth: Payer: Self-pay

## 2017-04-24 ENCOUNTER — Ambulatory Visit: Payer: Medicare Other | Admitting: Neurology

## 2017-04-24 LAB — SEDIMENTATION RATE: Sed Rate: 24 mm/hr (ref 0–30)

## 2017-04-24 NOTE — Telephone Encounter (Signed)
-----   Message from Pieter Partridge, DO sent at 04/24/2017  6:58 AM EDT ----- Blood test is normal

## 2017-04-24 NOTE — Telephone Encounter (Signed)
Called and spoke w/ Pt, advsd blood work is normal

## 2017-04-25 ENCOUNTER — Ambulatory Visit (HOSPITAL_COMMUNITY): Payer: Self-pay | Admitting: Cardiac Rehabilitation

## 2017-04-25 DIAGNOSIS — Z7689 Persons encountering health services in other specified circumstances: Secondary | ICD-10-CM | POA: Diagnosis not present

## 2017-04-25 NOTE — Progress Notes (Signed)
Incomplete Session Note  Patient Details  Name: Janellie Tennison MRN: [id number] Date of Birth: 1951-03-08 Referring Provider:     CARDIAC REHAB PHASE II ORIENTATION from 01/16/2017 in [place]  Referring Provider  Marijo File MD      [name] did not complete her rehab session.  Pt arrived at cardiac rehab at 7:50 am c/o nausea and diarrhea this morning. Pt advised unable to exercise and to notify PCP of symptoms for recommendation.  Offered to contact IM clinic on pt behalf for advise/appointment. Pt declined.  Pt states "I will go there now"   Pt instructed to not return for cardiac rehab appointments until symptom free for 48 hours.  Understanding verbalized.

## 2017-04-26 DIAGNOSIS — K219 Gastro-esophageal reflux disease without esophagitis: Secondary | ICD-10-CM | POA: Diagnosis not present

## 2017-04-26 DIAGNOSIS — H6091 Unspecified otitis externa, right ear: Secondary | ICD-10-CM | POA: Diagnosis not present

## 2017-05-02 ENCOUNTER — Encounter: Payer: Self-pay | Admitting: Internal Medicine

## 2017-05-02 ENCOUNTER — Ambulatory Visit (INDEPENDENT_AMBULATORY_CARE_PROVIDER_SITE_OTHER): Payer: Medicare Other | Admitting: Internal Medicine

## 2017-05-02 VITALS — BP 144/71 | HR 58 | Temp 97.9°F | Ht 66.0 in | Wt 299.2 lb

## 2017-05-02 DIAGNOSIS — Z8489 Family history of other specified conditions: Secondary | ICD-10-CM

## 2017-05-02 DIAGNOSIS — K219 Gastro-esophageal reflux disease without esophagitis: Secondary | ICD-10-CM | POA: Diagnosis not present

## 2017-05-02 DIAGNOSIS — Z7951 Long term (current) use of inhaled steroids: Secondary | ICD-10-CM | POA: Diagnosis not present

## 2017-05-02 DIAGNOSIS — G894 Chronic pain syndrome: Secondary | ICD-10-CM

## 2017-05-02 DIAGNOSIS — M79672 Pain in left foot: Secondary | ICD-10-CM | POA: Diagnosis not present

## 2017-05-02 DIAGNOSIS — Z79891 Long term (current) use of opiate analgesic: Secondary | ICD-10-CM | POA: Diagnosis not present

## 2017-05-02 DIAGNOSIS — R053 Chronic cough: Secondary | ICD-10-CM | POA: Insufficient documentation

## 2017-05-02 DIAGNOSIS — Z79899 Other long term (current) drug therapy: Secondary | ICD-10-CM | POA: Diagnosis not present

## 2017-05-02 DIAGNOSIS — I10 Essential (primary) hypertension: Secondary | ICD-10-CM | POA: Diagnosis not present

## 2017-05-02 DIAGNOSIS — R059 Cough, unspecified: Secondary | ICD-10-CM

## 2017-05-02 DIAGNOSIS — R05 Cough: Secondary | ICD-10-CM

## 2017-05-02 DIAGNOSIS — J4540 Moderate persistent asthma, uncomplicated: Secondary | ICD-10-CM | POA: Diagnosis not present

## 2017-05-02 MED ORDER — LOSARTAN POTASSIUM 100 MG PO TABS: 100.0000 mg | ORAL_TABLET | Freq: Every day | ORAL | 1 refills | Status: DC

## 2017-05-02 MED ORDER — FLUTICASONE-SALMETEROL 250-50 MCG/DOSE IN AEPB: 1.0000 | INHALATION_SPRAY | Freq: Two times a day (BID) | RESPIRATORY_TRACT | 11 refills | Status: DC

## 2017-05-02 MED ORDER — MORPHINE SULFATE ER 15 MG PO TBCR: 15.0000 mg | EXTENDED_RELEASE_TABLET | Freq: Every day | ORAL | 0 refills | Status: DC

## 2017-05-02 NOTE — Assessment & Plan Note (Signed)
Pt says that she would like a referral to podiatry, as she is having left foot pain, when she puts pressure on the left foot. This has been chronic, but worsened int he last month, because she has been putting pressure on the left lateral side of the foot. On exam, pulses intact, and no signs of infection.   Radiographs of the left foot from March 2018 show stable hardware from prior Lisfranc fracture dislocation as well as a subtalar fusion. Does have a fracture to the base of the fifth metatarsal which appears to be remote. No acute fracture  Plan -referred to podiatry- she will benefit from orthotics and gait training

## 2017-05-02 NOTE — Patient Instructions (Addendum)
Thank you for your visit today  Please stop the symbicort and start the Advair inhaler per the instructions. Please use the albuterol only as needed. Please call our office if you are having to use the albuterol more often, or if your symptoms worsen   Please stop the lisinopril and start the Losartan for your blood pressure  Please follow up with Dr. Heber Hudson on September 20th.

## 2017-05-02 NOTE — Assessment & Plan Note (Addendum)
She has history of mild-moderate persistent asthma, for which she is prescribed albuterol and symbicort. She has been taking the albuterol daily for the last week. She has not been taking the symbicort because it makes her jittery and is interested in switching the inhaler.    Plan -Switched the symbicort to Advair diskus. Advised to take this every day, and albuterol PRN.

## 2017-05-02 NOTE — Progress Notes (Signed)
Internal Medicine Clinic Attending  Case discussed with Dr. Saraiya at the time of the visit.  We reviewed the resident's history and exam and pertinent patient test results.  I agree with the assessment, diagnosis, and plan of care documented in the resident's note.  

## 2017-05-02 NOTE — Progress Notes (Signed)
    CC: cough, foot pain, asthma, HTN, chronic pain HPI: Ms.Sherron Slingerland is a 66 y.o. woman with PMH noted below here to discuss cough, foot pain, asthma, HTN, chronic pain refill  Please see Problem List/A&P for the status of the patient's chronic medical problems   Past Medical History:  Diagnosis Date  . Arthritis   . Asthma   . CAD (coronary artery disease) 07/07/2016   a. s/p stenting x 2 in [place] to the LAD, RCA is chronically occluded, LVEDP was 34  . Depression   . Fibromyalgia   . GERD (gastroesophageal reflux disease)   . History of benign pituitary tumor   . Hyperlipidemia   . Hypertension   . Migraine   . Morbid obesity ([place])   . Vertigo     Review of Systems:  Constitutional: Negative for fever, chills,  HEENT: No headaches, vision problems,  Respiratory: Has dry cough, and dyspnea, no wheezing. No runny nose, or sore throat. Cardiovascular: Negative for chest pain, orthopnea, or PND   Gastrointestinal: Has GERD. . No n/v/d/c Musculoskeletal: has chronic joint pain    Physical Exam: Vitals:   05/02/17 0842  BP: (!) 144/71  Pulse: (!) 58  Temp: 97.9 F (36.6 C)  TempSrc: Oral  SpO2: 100%  Weight: 299 lb 3.2 oz (135.7 kg)  Height: 5\' 6"  (1.676 m)    General: A&O, in NAD, obese HEENT: MMM. Oropharynx without exudates, mild erythema present  Neck: supple, midline trachea, no lymphadenopathy  CV: RRR, normal s1, s2, no m/r/g, Resp: equal and symmetric breath sounds, no wheezing or crackles  Abdomen: soft, nontender, nondistended, +BS Extremities: trace pitting edema b/l. Left foot  Near the toes tender to palpation  Assessment & Plan:   See encounters tab for problem based medical decision making. Patient discussed with Dr. Dareen Piano

## 2017-05-02 NOTE — Assessment & Plan Note (Addendum)
Patient has complaint of chronic dry cough for few months, which has worsened in the last week or so. She also feels associated dyspnea. She noticed that when she was at cardiac rehab last week. She denies rhinorrhea, sore throat, productive cough, fevers, chills or other constitutional symptoms. She has been taking mucinex.  She recently went to ENT for dysphagia and had an endoscopy done there with plans to follow up this month. They had increased her omeprazole to 40 mg BID.   She has history of mild-moderate persistent asthma, for which she is prescribed albuterol and symbicort. She has been taking the albuterol daily for the last week. She has not been taking the symbicort because it makes her jittery and is interested in switching the inhaler.   She is also taking the lisinopril for over a year for her hypertension and it is associated with dry cough.  Pt also has history of sleep apnea for which she wears CPAP.  She also takes claritin for allergic rhinitis.   She denies any dyspnea at night, orthopnea or PND. She has been taking her lasix. She does not seem volume overloaded.  Differentials at this time are viral URI, asthma inadequately controlled due to not using her maintenance inhalers, GERD, and allergic rhinitis. Her symptoms and exam are less consistent with pneumonia and heart failure.   Plan -Switched the symbicort to Advair diskus. Advised to take this every day, and albuterol PRN.  -switched lisinopril to losartan -continue omeprazole 40 mg BID -continue claritin -Return precautions given -follow up on 9/20

## 2017-05-02 NOTE — Assessment & Plan Note (Signed)
BP Readings from Last 3 Encounters:  05/02/17 (!) 144/71  04/23/17 124/68  04/04/17 135/65   Pt was slightly hypertensive today and she takes amlodipine and lisinopril for her hypertension.  Given that lisinopril/ACEi is associated with chronic dry cough, we will switch this to ARB.  Plan Continue amlodipine -stop lisinopril 40 mg, start losartan 100 mg daily

## 2017-05-02 NOTE — Assessment & Plan Note (Signed)
Patient would like refill of her morphine 15 mg daily , which was decreased from 15 mg BID at last visit. [place] Database search reveals that morphine was last prescribed on August 8th. Discussed with Dr Dareen Piano, prescribed 30 tablets of morphine 15 mg daily with no refills.  Plan -Refilled for 1 month

## 2017-05-04 ENCOUNTER — Encounter (HOSPITAL_COMMUNITY)
Admission: RE | Admit: 2017-05-04 | Discharge: 2017-05-04 | Disposition: A | Discharge status: Home / Self Care | Payer: Medicare Other | Source: Ambulatory Visit | Attending: Cardiology | Admitting: Cardiology

## 2017-05-04 DIAGNOSIS — D497 Neoplasm of unspecified behavior of endocrine glands and other parts of nervous system: Secondary | ICD-10-CM | POA: Diagnosis not present

## 2017-05-04 DIAGNOSIS — Z7689 Persons encountering health services in other specified circumstances: Secondary | ICD-10-CM | POA: Diagnosis not present

## 2017-05-04 DIAGNOSIS — H40023 Open angle with borderline findings, high risk, bilateral: Secondary | ICD-10-CM | POA: Diagnosis not present

## 2017-05-04 DIAGNOSIS — H25013 Cortical age-related cataract, bilateral: Secondary | ICD-10-CM | POA: Diagnosis not present

## 2017-05-04 DIAGNOSIS — Z955 Presence of coronary angioplasty implant and graft: Secondary | ICD-10-CM | POA: Insufficient documentation

## 2017-05-04 DIAGNOSIS — I214 Non-ST elevation (NSTEMI) myocardial infarction: Secondary | ICD-10-CM

## 2017-05-04 DIAGNOSIS — H47093 Other disorders of optic nerve, not elsewhere classified, bilateral: Secondary | ICD-10-CM | POA: Diagnosis not present

## 2017-05-04 NOTE — Progress Notes (Signed)
Pt arrived at cardiac rehab reporting appointment conflict with exercise session. Therefore, pt did not exercise.  Pt medication list reconciled. Pt given home exercise instructions and discharged home.  Pt had no c/o chronic illness symptoms today. Andi Hence, RN, BSN,  Cardiac Pulmonary Rehab

## 2017-05-11 NOTE — Progress Notes (Addendum)
Discharge Progress Report  Patient Details  Name: Alisha [name] MRN: [id number] Date of Birth: 04/18/1951 Referring Provider:     [facility] from 01/16/2017 in [place]  Referring Provider  Marijo File MD       Number of Visits: 21   Reason for Discharge:  Early Exit:  Insurance, pt met maximum program allowed  time period for participation. Pt frequent absences due to chronic illness complications and attending continuing education/job placement  courses.   Pt plans to continue exercising on her own. Pt given  information about local senior fitness centers.    Smoking History:  History  Smoking Status  . Never Smoker  Smokeless Tobacco  . Never Used    Diagnosis:  05/2016 NSTEMI (non-ST elevated myocardial infarction) ([place])  05/2016 Status post coronary artery stent placement  ADL UCSD:   Initial Exercise Prescription:     Initial Exercise Prescription - 01/16/17 1000      Date of Initial Exercise RX and Referring Provider   Date 01/16/17   Referring Provider Marijo File MD     NuStep   Level 3   SPM 80   Minutes 20   METs 1.8     Arm Ergometer   Level 1   Minutes 10   METs 1.3     Prescription Details   Frequency (times per week) 3   Duration Progress to 30 minutes of continuous aerobic without signs/symptoms of physical distress     Intensity   THRR 40-80% of Max Heartrate 61-122   Ratings of Perceived Exertion 11-13   Perceived Dyspnea 0-4     Progression   Progression Continue to progress workloads to maintain intensity without signs/symptoms of physical distress.     Resistance Training   Training Prescription Yes   Weight 2lbs   Reps 10-15      Discharge Exercise Prescription (Final Exercise Prescription Changes):     Exercise Prescription Changes - 05/04/17 1600      Response to Exercise   Blood Pressure (Admit) 116/70   Blood Pressure (Exercise) 140/80   Blood  Pressure (Exit) 128/80   Heart Rate (Admit) 86 bpm   Heart Rate (Exercise) 92 bpm   Heart Rate (Exit) 79 bpm   Rating of Perceived Exertion (Exercise) 11   Symptoms none   Duration Continue with 30 min of aerobic exercise without signs/symptoms of physical distress.   Intensity THRR unchanged     Progression   Progression Continue to progress workloads to maintain intensity without signs/symptoms of physical distress.   Average METs 2.5     Resistance Training   Training Prescription Yes   Weight 3lbs   Reps 10-15   Time 10 Minutes     NuStep   Level 3   SPM 100   Minutes 30   METs 2.5     Home Exercise Plan   Plans to continue exercise at Home (comment)  stationary bike and leg exercises for balance   Frequency Add 2 additional days to program exercise sessions.   Initial Home Exercises Provided 02/16/17      Functional Capacity:     6 Minute Walk    Row Name 01/16/17 1010 01/16/17 1017 04/20/17 1048     6 Minute Walk   Phase  -  - Discharge   Distance 0.48 feet  km not feet... pt did a step test  - 0.61 feet  km   Walk Time 6  minutes  - 6 minutes   # of Rest Breaks 0  - 0   METS 2  - 2.7   RPE 13  - 13   Symptoms No Yes (comment) No   Comments  - R knee discomfort  -   Resting HR 57 bpm  - 92 bpm   Resting BP 120/70  - 140/82   Max Ex. HR 74 bpm  - 106 bpm   Max Ex. BP 142/60  - 140/74   2 Minute Post BP  -  - 114/70      Psychological, QOL, Others - Outcomes: PHQ 2/9: Depression screen [facility] 2/9 05/04/2017 05/02/2017 04/04/2017 03/23/2017 03/09/2017  Decreased Interest 0 0 0 0 2  Down, Depressed, Hopeless 1 0 0 0 2  PHQ - 2 Score 1 0 0 0 4  Altered sleeping - - - - 2  Tired, decreased energy - - - - 2  Change in appetite - - - - -  Feeling bad or failure about yourself  - - - - 2  Trouble concentrating - - - - 2  Moving slowly or fidgety/restless - - - - 0  Suicidal thoughts - - - - 0  PHQ-9 Score - - - - 12  Difficult doing work/chores - - - - Very  difficult    Quality of Life:     Quality of Life - 05/04/17 1547      Quality of Life Scores   Health/Function Pre 10.75 %   Health/Function Post 20.13 %   Health/Function % Change 87.26 %   Socioeconomic Pre 14.5 %   Socioeconomic Post 20 %   Socioeconomic % Change  37.93 %   Psych/Spiritual Pre 15.33 %   Psych/Spiritual Post 21 %   Psych/Spiritual % Change 36.99 %   Family Pre 12.63 %   Family Post 20.1 %   Family % Change 59.14 %   GLOBAL Pre 12.96 %   GLOBAL Post 20.27 %   GLOBAL % Change 56.4 %      Personal Goals: Goals established at orientation with interventions provided to work toward goal.     Personal Goals and Risk Factors at Admission - 01/16/17 1127      Core Components/Risk Factors/Patient Goals on Admission    Weight Management Obesity   Intervention Obesity: Provide education and appropriate resources to help participant work on and attain dietary goals.;Weight Management/Obesity: Establish reasonable short term and long term weight goals.;Weight Management: Develop a combined nutrition and exercise program designed to reach desired caloric intake, while maintaining appropriate intake of nutrient and fiber, sodium and fats, and appropriate energy expenditure required for the weight goal.;Weight Management: Provide education and appropriate resources to help participant work on and attain dietary goals.   Expected Outcomes Short Term: Continue to assess and modify interventions until short term weight is achieved;Long Term: Adherence to nutrition and physical activity/exercise program aimed toward attainment of established weight goal;Weight Loss: Understanding of general recommendations for a balanced deficit meal plan, which promotes 1-2 lb weight loss per week and includes a negative energy balance of [phone] kcal/d;Understanding recommendations for meals to include 15-35% energy as protein, 25-35% energy from fat, 35-60% energy from carbohydrates, less than  '200mg'$  of dietary cholesterol, 20-35 gm of total fiber daily;Understanding of distribution of calorie intake throughout the day with the consumption of 4-5 meals/snacks;Weight Maintenance: Understanding of the daily nutrition guidelines, which includes 25-35% calories from fat, 7% or less cal from saturated fats, less  than '200mg'$  cholesterol, less than 1.5gm of sodium, & 5 or more servings of fruits and vegetables daily   Improve shortness of breath with ADL's Yes   Intervention Provide education, individualized exercise plan and daily activity instruction to help decrease symptoms of SOB with activities of daily living.   Expected Outcomes Short Term: Achieves a reduction of symptoms when performing activities of daily living.   Hypertension Yes   Intervention Provide education on lifestyle modifcations including regular physical activity/exercise, weight management, moderate sodium restriction and increased consumption of fresh fruit, vegetables, and low fat dairy, alcohol moderation, and smoking cessation.;Monitor prescription use compliance.   Expected Outcomes Short Term: Continued assessment and intervention until BP is < 140/55m HG in hypertensive participants. < 130/861mHG in hypertensive participants with diabetes, heart failure or chronic kidney disease.;Long Term: Maintenance of blood pressure at goal levels.   Lipids Yes   Intervention Provide education and support for participant on nutrition & aerobic/resistive exercise along with prescribed medications to achieve LDL '70mg'$ , HDL >'40mg'$ .   Expected Outcomes Short Term: Participant states understanding of desired cholesterol values and is compliant with medications prescribed. Participant is following exercise prescription and nutrition guidelines.;Long Term: Cholesterol controlled with medications as prescribed, with individualized exercise RX and with personalized nutrition plan. Value goals: LDL < '70mg'$ , HDL > 40 mg.   Stress Yes    Intervention Offer individual and/or small group education and counseling on adjustment to heart disease, stress management and health-related lifestyle change. Teach and support self-help strategies.;Refer participants experiencing significant psychosocial distress to appropriate mental health specialists for further evaluation and treatment. When possible, include family members and significant others in education/counseling sessions.   Personal Goal Other Yes   [facility], strengthen back muscles and posture   Intervention Provide exercise programming to assist with building exercise tolerance and aerobic capacity.   Expected Outcomes Pt will build on aerobic capacity and get back to water aerobics.       Personal Goals Discharge:     Goals and Risk Factor Review    Row Name 02/14/17 1737 03/12/17 1204 04/10/17 0903 05/04/17 1527       Core Components/Risk Factors/Patient Goals Review   Personal Goals Review Weight Management/Obesity;Improve shortness of breath with ADL's;Hypertension;Lipids;Stress;Other Weight Management/Obesity;Improve shortness of breath with ADL's;Hypertension;Lipids;Stress;Other Weight Management/Obesity;Improve shortness of breath with ADL's;Hypertension;Lipids;Stress;Other Weight Management/Obesity    Review pt with multiple CAD risk factors displays eagerness to participate in CR program, however pt does have frequent absences.  pt is also eager to establish care with new PCP, sleep apnea specialist and pulmonlogist.  referrrals have been placed.  pt with multiple CAD risk factors displays eagerness to participate in CR program, however pt does have frequent absences.  pt has upcoming appt with internal medicine clinic to establish care.  pt with multiple CAD risk factors displays eagerness to participate in CR program, however pt does have frequent absences.  pt encouraged by recent IM appt and feels her needs are  being addressed by new PCP.   Pt with ~3 lb wt gain. Wt loss goal not met.     Expected Outcomes pt will continue participation in CR exercise, nutrition and lifestyle education offerings to decrease overall risk factors.  pt will continue participation in CR exercise, nutrition and lifestyle education offerings to decrease overall risk factors.  pt will continue participation in CR exercise, nutrition and lifestyle education offerings to decrease overall risk factors.  Continue to  encourage exercise (decreased dependence on motorized scooter for short distances) and improved food choices to decrease overall risk factors.        Exercise Goals and Review:     Exercise Goals    Row Name 01/16/17 0911 01/16/17 0912           Exercise Goals   Intervention Provide advice, education, support and counseling about physical activity/exercise needs.;Develop an individualized exercise prescription for aerobic and resistive training based on initial evaluation findings, risk stratification, comorbidities and participant's personal goals.  -      Expected Outcomes Achievement of increased cardiorespiratory fitness and enhanced flexibility, muscular endurance and strength shown through measurements of functional capacity and personal statement of participant.  -      Increase Strength and Stamina Yes -  get back to [facility] aerobics at [facility], improve balance, strengthen back muscles and posture      Intervention Develop an individualized exercise prescription for aerobic and resistive training based on initial evaluation findings, risk stratification, comorbidities and participant's personal goals.;Provide advice, education, support and counseling about physical activity/exercise needs.  -      Expected Outcomes Achievement of increased cardiorespiratory fitness and enhanced flexibility, muscular endurance and strength shown through measurements of functional capacity and personal statement of participant. -  Be  able to improve walking tolerance without having to use scooter         Nutrition & Weight - Outcomes:     Pre Biometrics - 01/16/17 1021      Pre Biometrics   % Body Fat 57.1 %         Post Biometrics - 04/20/17 1049       Post  Biometrics   Waist Circumference 50 inches   Hip Circumference 58.5 inches   Waist to Hip Ratio 0.85 %   Triceps Skinfold 33 mm   % Body Fat 56.2 %   Grip Strength 40 kg   Flexibility --  LBP!   Single Leg Stand 0.87 seconds      Nutrition:   Nutrition Discharge:     Nutrition Assessments - 05/07/17 1114      MEDFICTS Scores   Pre Score 36   Post Score --  pt returned incomplete survey; couldn't score      Education Questionnaire Score:     Knowledge Questionnaire Score - 05/04/17 1547      Knowledge Questionnaire Score   Post Score 19/24      Goals reviewed with patient; copy given to patient.

## 2017-05-14 ENCOUNTER — Encounter: Payer: Self-pay | Admitting: Internal Medicine

## 2017-05-14 ENCOUNTER — Other Ambulatory Visit: Payer: Self-pay | Admitting: Physician Assistant

## 2017-05-14 DIAGNOSIS — M659 Synovitis and tenosynovitis, unspecified: Secondary | ICD-10-CM | POA: Insufficient documentation

## 2017-05-14 NOTE — Progress Notes (Unsigned)
Received records from [facility], appreciated that there were from [place]. So re completed Care Everywhere request and was able to locate and connect to her previous records.  Rheumatology records are now available to [place].

## 2017-05-16 NOTE — Progress Notes (Signed)
[facility] Subjective:  HPI: [name] is a 66 y.o. female who presents for follow up low back pain.  Please see Assessment and Plan below for the status of her chronic medical problems.  Review of Systems: Review of Systems  Constitutional: Negative for chills, fever and weight loss.  Respiratory: Negative for cough and shortness of breath.   Cardiovascular: Negative for chest pain.  Gastrointestinal: Negative for heartburn.  Musculoskeletal: Positive for back pain, joint pain and neck pain. Negative for falls.  Neurological: Negative for dizziness.  Psychiatric/Behavioral: Negative for depression.    Objective:  Physical Exam: Vitals:   05/17/17 0932  BP: (!) 116/47  Pulse: (!) 52  Temp: 97.7 F (36.5 C)  TempSrc: Oral  SpO2: 100%  Weight: (!) 300 lb 4.8 oz (136.2 kg)  Height: 5\' 6"  (1.676 m)  Physical Exam  Constitutional: No distress.  Cardiovascular: Normal rate and regular rhythm.   Pulmonary/Chest: Effort normal and breath sounds normal.  Abdominal:  obese  Musculoskeletal:       Right wrist: She exhibits no tenderness.       Left wrist: She exhibits no tenderness.       Right knee: Tenderness found. Medial joint line and lateral joint line tenderness noted.       Left knee: Tenderness found. Medial joint line and lateral joint line tenderness noted.       Right hand: She exhibits no tenderness.       Left hand: She exhibits no tenderness.  Skin: Skin is warm and dry.  Nursing note and vitals reviewed.  Assessment & Plan:  Essential hypertension HPI:No complains, denies any orthostatic symptoms. Reports compliance.  A: Essential HTN  P: Continue current medical therapy Losartan 100mg  daily Amlodipine 10mg  daily Nebivolol 5mg  daily Lasix 40mg  daily  Gastroesophageal reflux disease without esophagitis HPI: She has a history of hoarseness, and suspected GERD. She recently saw Dr Alisha [name] who suspected hoarseness was due  to reflux and increased her Nexium to BID and added ranitidine 300mg  QHS,  A: GERD  P: Continue nexium and ranitidine.  Patient is unsure if she has ever had EGD.  She was previously seening a gastroenterologist in [place]. Reviewed that last note (2014), had colonoscopy and recommended repeat in 3 years.  Will refer over to GI for follow up of GERD and repeat colonoscopy.  Psoriatic arthritis ([place]) HPI: Was previously diagnosd with psoriatic arthritis by Rheum in [facility], we have been working to get her into rheum here, Initially had records faxed but was able to connect with [place] so records are avaliable now.  Per last note in March 2017 plan was for cimzia.  Today she is overall appears to be doing well. She does complain of chronic back and neck pain.  A: Psoratic arthortis   P:Referral to rheum pending   Fibromyalgia HPI: She reports a history of fibromyalgia which [facility], she is not on any current meidcation for this.  A: Fibromyalgia  P: Her last PCP appears to have started sertraline (low dose) patient reports no difference and no longer taking.  Will try starting Cymbalta 30mg  daily, increase to 60mg  after 1 week.  Chronic pain syndrome HPI: Patient has a history of chronic pain.  She reports this is mostly in her back and neck.  She has a known history of OA, Psoratic arthritis, Morbid obesity, Fibromyalgia, b/l carpal tunnel surgery, knee replacements.  She reports she was on [place] Cotin  15mg  TID for at least 5 years (orginally started by [employer] in [place] per her report) and oxycodone-apap 5-325 for breakthrough pain, it appears her last PCP did have her on this regimen, However due to lack of follow up and non complaince the relationship was terminated, She was weaned down to [place] Cotin 15mg  BID, when she established here she was apparently further reduced to MS Cotin 15mg  daily.  She reports due to this she has had increased pain and  generalized feeling unwell. She has been taking some oxyccodone apap from her previous provider as she never used as many as she received.  A: Chronic pain syndrome on chronic opioid medications  P: This appears to have been an aggressive weaning.  Will have her increase MScotin back to 15mg  BID.  I did provide her with Oxycodone 5-325mg  as needed for breakthrough pain (#30) will have to continue to readdress this to see how much she is needed.  I went through our clinic opioid policy.  Discussed some function goals, increased walking outside house, ability to play with grand kids.  I discussed potential risks associated with opioids.  I attempted to review the [place] however I was unable to access the website at the time, I was able to review it later and it is consistent with EPIC chart history. I had wanted to obtain a UDS for baseline, however this was a very busy visit and I did not instruct my nurse or patient to collect this, will need to obtain at next visit.  Morbid obesity ([place]) -Discussed importance of weight loss   Medications Ordered Meds ordered this encounter  Medications  . DULoxetine (CYMBALTA) 30 MG capsule    Sig: Take 1 capsule (30 mg total) by mouth daily. For one week then increase to 60mg  daily    Dispense:  7 capsule    Refill:  0  . DULoxetine (CYMBALTA) 60 MG capsule    Sig: Take 1 capsule (60 mg total) by mouth daily.    Dispense:  90 capsule    Refill:  3  . DISCONTD: morphine (MS CONTIN) 15 MG 12 hr tablet    Sig: Take 1 tablet (15 mg total) by mouth every 12 (twelve) hours.    Dispense:  60 tablet    Refill:  0    Rx 1/2  . morphine (MS CONTIN) 15 MG 12 hr tablet    Sig: Take 1 tablet (15 mg total) by mouth every 12 (twelve) hours.    Dispense:  60 tablet    Refill:  0    Rx 2/2  . DISCONTD: oxyCODONE-acetaminophen (ROXICET) 5-325 MG tablet    Sig: Take 1 tablet by mouth every 8 (eight) hours as needed for severe pain (breakthrough).    Dispense:  30  tablet    Refill:  0    Rx 1/2 Please fill 30 days after last Rx  . oxyCODONE-acetaminophen (ROXICET) 5-325 MG tablet    Sig: Take 1 tablet by mouth every 8 (eight) hours as needed for severe pain (breakthrough).    Dispense:  30 tablet    Refill:  0    Rx 2/2 Please fill 30 days after last Rx   Other Orders Orders Placed This Encounter  Procedures  . Flu Vaccine QUAD 36+ mos IM  . Ambulatory referral to Gastroenterology    Referral Priority:   Routine    Referral Type:   Consultation    Referral Reason:   Specialty Services Required  Requested Specialty:   Gastroenterology    Number of Visits Requested:   1   Follow Up: Return in about 2 months (around 07/17/2017).

## 2017-05-17 ENCOUNTER — Ambulatory Visit (INDEPENDENT_AMBULATORY_CARE_PROVIDER_SITE_OTHER): Payer: Medicare Other | Admitting: Internal Medicine

## 2017-05-17 ENCOUNTER — Encounter: Payer: Self-pay | Admitting: Internal Medicine

## 2017-05-17 VITALS — BP 116/47 | HR 52 | Temp 97.7°F | Ht 66.0 in | Wt 300.3 lb

## 2017-05-17 DIAGNOSIS — Z6841 Body Mass Index (BMI) 40.0 and over, adult: Secondary | ICD-10-CM

## 2017-05-17 DIAGNOSIS — G894 Chronic pain syndrome: Secondary | ICD-10-CM

## 2017-05-17 DIAGNOSIS — I10 Essential (primary) hypertension: Secondary | ICD-10-CM | POA: Diagnosis not present

## 2017-05-17 DIAGNOSIS — Z9889 Other specified postprocedural states: Secondary | ICD-10-CM | POA: Diagnosis not present

## 2017-05-17 DIAGNOSIS — L4050 Arthropathic psoriasis, unspecified: Secondary | ICD-10-CM

## 2017-05-17 DIAGNOSIS — K219 Gastro-esophageal reflux disease without esophagitis: Secondary | ICD-10-CM

## 2017-05-17 DIAGNOSIS — M797 Fibromyalgia: Secondary | ICD-10-CM

## 2017-05-17 DIAGNOSIS — Z79899 Other long term (current) drug therapy: Secondary | ICD-10-CM

## 2017-05-17 DIAGNOSIS — Z96653 Presence of artificial knee joint, bilateral: Secondary | ICD-10-CM | POA: Diagnosis not present

## 2017-05-17 DIAGNOSIS — Z23 Encounter for immunization: Secondary | ICD-10-CM

## 2017-05-17 DIAGNOSIS — Z79891 Long term (current) use of opiate analgesic: Secondary | ICD-10-CM

## 2017-05-17 DIAGNOSIS — M1990 Unspecified osteoarthritis, unspecified site: Secondary | ICD-10-CM | POA: Diagnosis not present

## 2017-05-17 MED ORDER — MORPHINE SULFATE ER 15 MG PO TBCR: 15.0000 mg | EXTENDED_RELEASE_TABLET | Freq: Two times a day (BID) | ORAL | 0 refills | Status: DC

## 2017-05-17 MED ORDER — OXYCODONE-ACETAMINOPHEN 5-325 MG PO TABS: 1.0000 | ORAL_TABLET | Freq: Three times a day (TID) | ORAL | 0 refills | Status: DC | PRN

## 2017-05-17 MED ORDER — DULOXETINE HCL 30 MG PO CPEP: 30.0000 mg | ORAL_CAPSULE | Freq: Every day | ORAL | 0 refills | Status: DC

## 2017-05-17 MED ORDER — DULOXETINE HCL 60 MG PO CPEP: 60.0000 mg | ORAL_CAPSULE | Freq: Every day | ORAL | 3 refills | Status: AC

## 2017-05-17 NOTE — Patient Instructions (Signed)
Stop gabapentin.  I want you to start Cymbalta 30mg  daily for one week then increase to 60mg  daily.

## 2017-05-18 ENCOUNTER — Ambulatory Visit (INDEPENDENT_AMBULATORY_CARE_PROVIDER_SITE_OTHER): Payer: Medicaid Other

## 2017-05-18 ENCOUNTER — Ambulatory Visit (INDEPENDENT_AMBULATORY_CARE_PROVIDER_SITE_OTHER): Payer: Medicare Other | Admitting: Podiatry

## 2017-05-18 DIAGNOSIS — M19072 Primary osteoarthritis, left ankle and foot: Secondary | ICD-10-CM | POA: Diagnosis not present

## 2017-05-18 DIAGNOSIS — M84375S Stress fracture, left foot, sequela: Secondary | ICD-10-CM | POA: Diagnosis not present

## 2017-05-18 DIAGNOSIS — M659 Synovitis and tenosynovitis, unspecified: Secondary | ICD-10-CM

## 2017-05-18 DIAGNOSIS — R2681 Unsteadiness on feet: Secondary | ICD-10-CM | POA: Diagnosis not present

## 2017-05-18 NOTE — Assessment & Plan Note (Signed)
HPI: She reports a history of fibromyalgia which was [facility], she is not on any current meidcation for this.  A: Fibromyalgia  P: Her last PCP appears to have started sertraline (low dose) patient reports no difference and no longer taking.  Will try starting Cymbalta 30mg  daily, increase to 60mg  after 1 week.

## 2017-05-18 NOTE — Assessment & Plan Note (Signed)
HPI: She has a history of hoarseness, and suspected GERD. She recently saw Dr Janace Hoard who suspected hoarseness was due to reflux and increased her Nexium to BID and added ranitidine 300mg  QHS,  A: GERD  P: Continue nexium and ranitidine.  Patient is unsure if she has ever had EGD.  She was previously seening a gastroenterologist in [place]. Reviewed that last note (2014), had colonoscopy and recommended repeat in 3 years.  Will refer over to GI for follow up of GERD and repeat colonoscopy.

## 2017-05-18 NOTE — Assessment & Plan Note (Signed)
Discussed importance of weight loss. 

## 2017-05-18 NOTE — Assessment & Plan Note (Signed)
HPI: Was previously diagnosd with psoriatic arthritis by Rheum in [facility], we have been working to get her into rheum here, Initially had records faxed but was able to connect with [facility] so records are avaliable now.  Per last note in March 2017 plan was for cimzia.  Today she is overall appears to be doing well. She does complain of chronic back and neck pain.  A: Psoratic arthortis   P:Referral to rheum pending

## 2017-05-18 NOTE — Assessment & Plan Note (Signed)
HPI: Patient has a history of chronic pain.  She reports this is mostly in her back and neck.  She has a known history of OA, Psoratic arthritis, Morbid obesity, Fibromyalgia, b/l carpal tunnel surgery, knee replacements.  She reports she was on MS Cotin 15mg  TID for at least 5 years (orginally started by [employer] in [place] per her report) and oxycodone-apap 5-325 for breakthrough pain, it appears her last PCP did have her on this regimen, However due to lack of follow up and non complaince the relationship was terminated, She was weaned down to [place] Cotin 15mg  BID, when she established here she was apparently further reduced to [place] Cotin 15mg  daily.  She reports due to this she has had increased pain and generalized feeling unwell. She has been taking some oxyccodone apap from her previous provider as she never used as many as she received.  A: Chronic pain syndrome on chronic opioid medications  P: This appears to have been an aggressive weaning.  Will have her increase MScotin back to 15mg  BID.  I did provide her with Oxycodone 5-325mg  as needed for breakthrough pain (#30) will have to continue to readdress this to see how much she is needed.  I went through our clinic opioid policy.  Discussed some function goals, increased walking outside house, ability to play with grand kids.  I discussed potential risks associated with opioids.  I attempted to review the [place] however I was unable to access the website at the time, I was able to review it later and it is consistent with EPIC chart history. I had wanted to obtain a UDS for baseline, however this was a very busy visit and I did not instruct my nurse or patient to collect this, will need to obtain at next visit.

## 2017-05-18 NOTE — Assessment & Plan Note (Signed)
HPI:No complains, denies any orthostatic symptoms. Reports compliance.  A: Essential HTN  P: Continue current medical therapy Losartan 100mg  daily Amlodipine 10mg  daily Nebivolol 5mg  daily Lasix 40mg  daily

## 2017-05-21 ENCOUNTER — Encounter: Payer: Self-pay | Admitting: Cardiology

## 2017-05-21 ENCOUNTER — Other Ambulatory Visit: Payer: Self-pay

## 2017-05-21 ENCOUNTER — Ambulatory Visit (INDEPENDENT_AMBULATORY_CARE_PROVIDER_SITE_OTHER): Payer: Medicare Other | Admitting: Cardiology

## 2017-05-21 VITALS — BP 136/78 | HR 58 | Ht 66.0 in | Wt 300.0 lb

## 2017-05-21 DIAGNOSIS — I10 Essential (primary) hypertension: Secondary | ICD-10-CM

## 2017-05-21 DIAGNOSIS — G4733 Obstructive sleep apnea (adult) (pediatric): Secondary | ICD-10-CM

## 2017-05-21 MED ORDER — BACLOFEN 10 MG PO TABS: 10.0000 mg | ORAL_TABLET | Freq: Every day | ORAL | 2 refills | Status: DC
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
## 2017-05-21 NOTE — Progress Notes (Signed)
Rcvd fax from [place], Tizanidine 4mg  is on backorder, they are requesting alternative. Per Dr Tomi Likens, Baclofen 10mg , 1QHS for R. Temporal tenderness

## 2017-05-21 NOTE — Patient Instructions (Addendum)
Medication Instructions:  Your physician recommends that you continue on your current medications as directed. Please refer to the Current Medication list given to you today.   Labwork: None ordered  Testing/Procedures: Your physician has recommended that you have a sleep study. This test records several body functions during sleep, including: brain activity, eye movement, oxygen and carbon dioxide blood levels, heart rate and rhythm, breathing rate and rhythm, the flow of air through your mouth and nose, snoring, body muscle movements, and chest and belly movement.   Follow-Up: Your physician wants you to follow-up AS NEEDED if symptoms worsen or fail to improve  Any Other Special Instructions Will Be Listed Below (If Applicable).     If you need a refill on your cardiac medications before your next appointment, please call your pharmacy.

## 2017-05-22 ENCOUNTER — Telehealth: Payer: Self-pay | Admitting: *Deleted

## 2017-05-22 DIAGNOSIS — M150 Primary generalized (osteo)arthritis: Secondary | ICD-10-CM | POA: Diagnosis not present

## 2017-05-22 DIAGNOSIS — M7989 Other specified soft tissue disorders: Secondary | ICD-10-CM | POA: Diagnosis not present

## 2017-05-22 DIAGNOSIS — M797 Fibromyalgia: Secondary | ICD-10-CM | POA: Diagnosis not present

## 2017-05-22 DIAGNOSIS — L409 Psoriasis, unspecified: Secondary | ICD-10-CM | POA: Diagnosis not present

## 2017-05-22 DIAGNOSIS — G894 Chronic pain syndrome: Secondary | ICD-10-CM | POA: Diagnosis not present

## 2017-05-22 DIAGNOSIS — M3500 Sicca syndrome, unspecified: Secondary | ICD-10-CM | POA: Diagnosis not present

## 2017-05-22 DIAGNOSIS — M2550 Pain in unspecified joint: Secondary | ICD-10-CM | POA: Diagnosis not present

## 2017-05-22 NOTE — Progress Notes (Signed)
Subjective:    Patient ID: Alisha [name], female   DOB: 66 y.o.   MRN: [id number]   HPI [name] Presents the office today for concerns of chronic left foot pain. She states that she's had multiple surgeries to her left foot. Over the last couple months she started getting pain in the also asked of her foot and her ankle as well. She states that she has difficulty putting weight pressure down to her foot because of the pain. She denies any recent injury or trauma. She has no some mild swelling that she's had some ongoing swelling since her surgeries. Denies any redness or warmth. She said no recent treatment for this. She recently just moved [place]. She has no other concerns today.   Review of Systems  All other systems reviewed and are negative.  Past Medical History:  Diagnosis Date  . Arthritis   . Asthma   . CAD (coronary artery disease) 07/07/2016   a. s/p stenting x 2 in [place] to the LAD, RCA is chronically occluded, LVEDP was 34  . Depression   . Fibromyalgia   . GERD (gastroesophageal reflux disease)   . History of benign pituitary tumor   . Hyperlipidemia   . Hypertension   . Migraine   . Morbid obesity ([place])   . Vertigo      Past Surgical History:  Procedure Laterality Date  . carpel tunnel release Bilateral 2017  . EXCISION / CURETTAGE BONE CYST PHALANGES OF FOOT  2014   Removal of foot cyst   . PARTIAL HYSTERECTOMY  unknown   Patient still has ovaries  . pituitary tumor removal  1999  . TONSILLECTOMY    . TOTAL KNEE ARTHROPLASTY     TKR X 3       Objective:  Physical Exam General: AAO x3, NAD  Dermatological: Skin is warm, dry and supple bilateral. Nails x 10 are well manicured; remaining integument appears unremarkable at this time. There are no open sores, no preulcerative lesions, no rash or signs of infection present.  Vascular: Dorsalis Pedis artery and Posterior Tibial artery pedal pulses are 2/4 bilateral with immedate capillary fill  time. There is no pain with calf compression, swelling, warmth, erythema.   Neruologic: Grossly intact via light touch bilateral. Protective threshold with Semmes Wienstein monofilament intact to all pedal sites bilateral.   Musculoskeletal: Patient presents today in a motorized scooter. She has tenderness the fifth metatarsal base and along the metatarsal cuboid joint as well as the calcaneocuboid joint on the lateral aspect of the foot. She states that she cannot put weight down to her foot without pain. There is mild slender the area and there is no erythema or increase in warmth. . There is no other area of tenderness. No subtalar joint range of motion from prior surgery. Ankle range of motion restricted and painful with range of motion. There is crepitation with ankle joint range of motion. MMT 5/5.  Gait: Unassisted, Nonantalgic.      Assessment:     Osteoarthritis left ankle as well as the foot concern for subacute fracture fifth metatarsal base    Plan:     -Treatment options discussed including all alternatives, risks, and complications -Etiology of symptoms were discussed -X-rays were obtained and reviewed with the patient. Prior surgery on the Lisfranc joint is with subtotal joint. Arthritic changes present to the ankle joint. Consent for subacute fracture of the fifth metatarsal base. She states that she did fall about 2 months  ago after discussion with her this may have happened during that time. -Given the amount of pain to her foot as well as x-ray findings I recommend immobilization in a cam boot. This was dispensed to her today.  -Long-term I think should benefit from a custom [place] type brace given the also arthritis, pain as well as instability in her gait. I will have her back to see Liliane Channel for casting of the brace but also discussed with Dawn precertification of the brace.  -Follow-up with me in 3-4 weeks or sooner if any issues are to arise. Call any questions or concerns  meantime.   Celesta Gentile, DPM

## 2017-05-22 NOTE — Telephone Encounter (Signed)
Per Dr Radford Pax split night sleep study ordered: Informed patient of upcoming sleep study and patient understanding was verbalized. Patient understands her sleep study is scheduled for Monday July 09 2017. Patient understands her sleep study will be done at [facility] sleep lab. Patient understands she will receive a sleep packet in a week or so. Patient understands to call if she does not receive the sleep packet in a timely manner. Patient agrees with treatment and thanked me for call.

## 2017-05-23 ENCOUNTER — Ambulatory Visit (INDEPENDENT_AMBULATORY_CARE_PROVIDER_SITE_OTHER): Payer: Medicare Other | Admitting: Orthotics

## 2017-05-23 DIAGNOSIS — M19072 Primary osteoarthritis, left ankle and foot: Secondary | ICD-10-CM

## 2017-05-23 DIAGNOSIS — M84375S Stress fracture, left foot, sequela: Secondary | ICD-10-CM

## 2017-05-23 DIAGNOSIS — R2681 Unsteadiness on feet: Secondary | ICD-10-CM

## 2017-05-24 NOTE — Progress Notes (Signed)
Patient presents today for evaluation/casting for AFO brace (L).   Patient has hx of the following conditions: Gait instability,  Ankle instabilty, OA left foot. Also patient has hx of discomfort 5th base and along calcaneal cuboid joint.    Gait analysis done and patient displays significant  abnormality of gait in both sagittial and frontal planes, and could benefit in aggressive ankle support.    Patient has to depend upon scooter to assist in mobility related daily activities, and wants the ankle support.  Patient chose [place] brace w/ lace/speed laces.

## 2017-05-30 ENCOUNTER — Other Ambulatory Visit: Payer: Medicaid Other | Admitting: Orthotics

## 2017-05-30 DIAGNOSIS — K219 Gastro-esophageal reflux disease without esophagitis: Secondary | ICD-10-CM | POA: Diagnosis not present

## 2017-06-06 ENCOUNTER — Other Ambulatory Visit: Payer: Self-pay | Admitting: Gastroenterology

## 2017-06-06 DIAGNOSIS — R1032 Left lower quadrant pain: Secondary | ICD-10-CM | POA: Diagnosis not present

## 2017-06-06 DIAGNOSIS — R1310 Dysphagia, unspecified: Secondary | ICD-10-CM | POA: Diagnosis not present

## 2017-06-06 DIAGNOSIS — K5903 Drug induced constipation: Secondary | ICD-10-CM | POA: Diagnosis not present

## 2017-06-06 DIAGNOSIS — K219 Gastro-esophageal reflux disease without esophagitis: Secondary | ICD-10-CM | POA: Diagnosis not present

## 2017-06-08 ENCOUNTER — Telehealth: Payer: Self-pay | Admitting: Cardiology

## 2017-06-08 NOTE — Telephone Encounter (Signed)
Returned call to [facility] @ Dr. Kathline Magic office to determine what the fax was in reference to. Asked operator to have Melissa return call

## 2017-06-08 NOTE — Telephone Encounter (Signed)
Spoke with Melissa @ Dr. Michail Sermon  Patient is having an endoscopy w/propofol on 10/18 at hospital . She will need to hold Brilinta prior. Clearance needs to be sent to Fax: [phone] AND call Melissa @ Dr. Kathline Magic office.

## 2017-06-08 NOTE — Telephone Encounter (Signed)
Called, [place] spoke w Lenna Sciara regarding recommendations. He has already discussed w patient. Electronically faxed to [facility] at provided number Gave Melissa my direct line to call back today if issues.

## 2017-06-08 NOTE — Telephone Encounter (Signed)
    Chart reviewed as part of pre-operative protocol coverage. Patient contaced 06/08/2017 in reference to pre-operative risk assessment for pending surgery as outlined below.  Alisha [name] was last seen on 03/14/2017 by [name].  Since that day, Alisha [name] has done well from cardiac perspective. She denies any chest pain or SOB. Received preoperative clearance from Dr. Kathline Magic office with question on when to hold Brilinta. I have reviewed patient's previous cath report from [facility] in [place], last cardiac catheterization was performed on 06/29/2016 with DES x 2 to prox LAD. Technically she still has 20 days left to complete at least 12 month dual antiplatelet therapy. Talking with the patient, she says she has a lot of GI discomfort and does not wish to wait for 20 more days.   I have discussed the case with Dr. Percival Spanish, patient has complete less than full 12 month of DAPT. First of all, she will need to continue aspirin through the colonoscopy. Second, it is more ideal for her to stay on Brilinta if Dr. Michail Sermon can do a diagnostic only colonscopy. However if diagnostic only procedure cannot be performed and intervention is expected, then patient can hold Brilinta for 5 days before the procedure and restart as soon as possible after the procedure. This is less ideal, but acceptable if she really need the procedure since she is so close to finishing the 12 month course. Patient should be instructed to monitor for any chest pain or increasing SOB.   Therefore, based on ACC/AHA guidelines, the patient would be at acceptable risk for the planned procedure without further cardiovascular testing.   [name], [place] 06/08/2017, 3:16 PM

## 2017-06-08 NOTE — Telephone Encounter (Signed)
New message   Alisha [name] is calling to find out if fax was recieved that she faxed over on the 10th. Please call.

## 2017-06-08 NOTE — Telephone Encounter (Signed)
    [facility] Pre-operative Risk Assessment    Request for surgical clearance:  1. What type of surgery is being performed? endoscopy @ hospital  2. When is this surgery scheduled? 06/14/17   3. Are there any medications that need to be held prior to surgery and how long? Brilinta   4. Practice name and name of physician performing surgery? Dr. Michail Sermon with [facility]   5. What is your office phone and fax number? (p) [phone]  (f) [phone]   6. Anesthesia type (None, local, MAC, general) ? propofol    Sheral Apley M 06/08/2017, 2:18 PM  _________________________________________________________________   (provider comments below)

## 2017-06-13 ENCOUNTER — Encounter (HOSPITAL_COMMUNITY): Payer: Self-pay | Admitting: *Deleted

## 2017-06-13 NOTE — Progress Notes (Signed)
Spoke with pt for pre-op call. Pt has CAD and with stents placed last November. Pt is on Brilinta, was instructed to stop 5 days prior by cardiologist. Last dose was 06/08/17. Pt denies any recent chest pain or sob. Pt states she is not diabetic.  Echo - 06/29/16 - Care Everywhere Cath - 06/29/16 - Care Everywhere EKG - 02/21/17 - EPIC

## 2017-06-14 ENCOUNTER — Ambulatory Visit (HOSPITAL_COMMUNITY)
Admission: RE | Admit: 2017-06-14 | Discharge: 2017-06-14 | Disposition: A | Discharge status: Home / Self Care | Payer: Medicare Other | Source: Ambulatory Visit | Attending: Gastroenterology | Admitting: Gastroenterology

## 2017-06-14 ENCOUNTER — Encounter (HOSPITAL_COMMUNITY)
Admission: RE | Disposition: A | Discharge status: Home / Self Care | Payer: Self-pay | Source: Ambulatory Visit | Attending: Gastroenterology

## 2017-06-14 ENCOUNTER — Encounter (HOSPITAL_COMMUNITY): Payer: Self-pay | Admitting: Anesthesiology

## 2017-06-14 ENCOUNTER — Ambulatory Visit (HOSPITAL_COMMUNITY): Payer: Medicare Other | Admitting: Anesthesiology

## 2017-06-14 DIAGNOSIS — I252 Old myocardial infarction: Secondary | ICD-10-CM | POA: Diagnosis not present

## 2017-06-14 DIAGNOSIS — Z91011 Allergy to milk products: Secondary | ICD-10-CM | POA: Insufficient documentation

## 2017-06-14 DIAGNOSIS — Z809 Family history of malignant neoplasm, unspecified: Secondary | ICD-10-CM | POA: Diagnosis not present

## 2017-06-14 DIAGNOSIS — K2900 Acute gastritis without bleeding: Secondary | ICD-10-CM | POA: Insufficient documentation

## 2017-06-14 DIAGNOSIS — J45909 Unspecified asthma, uncomplicated: Secondary | ICD-10-CM | POA: Insufficient documentation

## 2017-06-14 DIAGNOSIS — F319 Bipolar disorder, unspecified: Secondary | ICD-10-CM | POA: Insufficient documentation

## 2017-06-14 DIAGNOSIS — M1990 Unspecified osteoarthritis, unspecified site: Secondary | ICD-10-CM | POA: Diagnosis not present

## 2017-06-14 DIAGNOSIS — M797 Fibromyalgia: Secondary | ICD-10-CM | POA: Diagnosis not present

## 2017-06-14 DIAGNOSIS — E785 Hyperlipidemia, unspecified: Secondary | ICD-10-CM | POA: Insufficient documentation

## 2017-06-14 DIAGNOSIS — N3281 Overactive bladder: Secondary | ICD-10-CM | POA: Insufficient documentation

## 2017-06-14 DIAGNOSIS — Z91012 Allergy to eggs: Secondary | ICD-10-CM | POA: Diagnosis not present

## 2017-06-14 DIAGNOSIS — F341 Dysthymic disorder: Secondary | ICD-10-CM | POA: Insufficient documentation

## 2017-06-14 DIAGNOSIS — G43909 Migraine, unspecified, not intractable, without status migrainosus: Secondary | ICD-10-CM | POA: Insufficient documentation

## 2017-06-14 DIAGNOSIS — K210 Gastro-esophageal reflux disease with esophagitis: Secondary | ICD-10-CM | POA: Diagnosis not present

## 2017-06-14 DIAGNOSIS — Z886 Allergy status to analgesic agent status: Secondary | ICD-10-CM | POA: Diagnosis not present

## 2017-06-14 DIAGNOSIS — Z881 Allergy status to other antibiotic agents status: Secondary | ICD-10-CM | POA: Insufficient documentation

## 2017-06-14 DIAGNOSIS — R1310 Dysphagia, unspecified: Secondary | ICD-10-CM | POA: Insufficient documentation

## 2017-06-14 DIAGNOSIS — Z8249 Family history of ischemic heart disease and other diseases of the circulatory system: Secondary | ICD-10-CM | POA: Insufficient documentation

## 2017-06-14 DIAGNOSIS — I2510 Atherosclerotic heart disease of native coronary artery without angina pectoris: Secondary | ICD-10-CM | POA: Diagnosis not present

## 2017-06-14 DIAGNOSIS — Z888 Allergy status to other drugs, medicaments and biological substances status: Secondary | ICD-10-CM | POA: Insufficient documentation

## 2017-06-14 DIAGNOSIS — Z87442 Personal history of urinary calculi: Secondary | ICD-10-CM | POA: Insufficient documentation

## 2017-06-14 DIAGNOSIS — Z96659 Presence of unspecified artificial knee joint: Secondary | ICD-10-CM | POA: Insufficient documentation

## 2017-06-14 DIAGNOSIS — L4050 Arthropathic psoriasis, unspecified: Secondary | ICD-10-CM | POA: Insufficient documentation

## 2017-06-14 DIAGNOSIS — I739 Peripheral vascular disease, unspecified: Secondary | ICD-10-CM | POA: Diagnosis not present

## 2017-06-14 DIAGNOSIS — K219 Gastro-esophageal reflux disease without esophagitis: Secondary | ICD-10-CM | POA: Diagnosis not present

## 2017-06-14 DIAGNOSIS — I10 Essential (primary) hypertension: Secondary | ICD-10-CM | POA: Insufficient documentation

## 2017-06-14 DIAGNOSIS — Z6841 Body Mass Index (BMI) 40.0 and over, adult: Secondary | ICD-10-CM | POA: Insufficient documentation

## 2017-06-14 DIAGNOSIS — Z90710 Acquired absence of both cervix and uterus: Secondary | ICD-10-CM | POA: Diagnosis not present

## 2017-06-14 DIAGNOSIS — Z8541 Personal history of malignant neoplasm of cervix uteri: Secondary | ICD-10-CM | POA: Insufficient documentation

## 2017-06-14 DIAGNOSIS — Z86718 Personal history of other venous thrombosis and embolism: Secondary | ICD-10-CM | POA: Insufficient documentation

## 2017-06-14 HISTORY — DX: Bipolar disorder, unspecified: F31.9

## 2017-06-14 HISTORY — DX: Personal history of urinary calculi: Z87.442

## 2017-06-14 HISTORY — DX: Peripheral vascular disease, unspecified: I73.9

## 2017-06-14 HISTORY — DX: Malignant (primary) neoplasm, unspecified: C80.1

## 2017-06-14 HISTORY — PX: ESOPHAGOGASTRODUODENOSCOPY (EGD) WITH PROPOFOL: SHX5813

## 2017-06-14 HISTORY — DX: Unspecified convulsions: R56.9

## 2017-06-14 HISTORY — DX: Pneumonia, unspecified organism: J18.9

## 2017-06-14 HISTORY — DX: Sleep apnea, unspecified: G47.30

## 2017-06-14 SURGERY — ESOPHAGOGASTRODUODENOSCOPY (EGD) WITH PROPOFOL
Anesthesia: Monitor Anesthesia Care

## 2017-06-14 MED ORDER — LIDOCAINE 2% (20 MG/ML) 5 ML SYRINGE
INTRAMUSCULAR | Status: DC | PRN
  Administered 2017-06-14: 40 mg via INTRAVENOUS

## 2017-06-14 MED ORDER — PROPOFOL 10 MG/ML IV BOLUS
INTRAVENOUS | Status: DC | PRN
  Administered 2017-06-14: 80 mg via INTRAVENOUS
  Administered 2017-06-14: 50 mg via INTRAVENOUS
  Administered 2017-06-14: 40 mg via INTRAVENOUS
  Administered 2017-06-14: 30 mg via INTRAVENOUS

## 2017-06-14 MED ORDER — LACTATED RINGERS IV SOLN
INTRAVENOUS | Status: DC
  Administered 2017-06-14: 09:00:00 via INTRAVENOUS

## 2017-06-14 MED ORDER — OXYMETAZOLINE HCL 0.05 % NA SOLN
NASAL | Status: DC | PRN
  Administered 2017-06-14: 2 via NASAL

## 2017-06-14 MED ORDER — GLYCOPYRROLATE 0.2 MG/ML IJ SOLN
INTRAMUSCULAR | Status: DC | PRN
  Administered 2017-06-14: 0.2 mg via INTRAVENOUS

## 2017-06-14 MED ORDER — LACTATED RINGERS IV SOLN
INTRAVENOUS | Status: DC | PRN
  Administered 2017-06-14: 09:00:00 via INTRAVENOUS

## 2017-06-14 MED ORDER — SODIUM CHLORIDE 0.9 % IV SOLN: INTRAVENOUS | Status: DC

## 2017-06-14 MED ORDER — METOCLOPRAMIDE HCL 5 MG/ML IJ SOLN: 10.0000 mg | Freq: Once | INTRAMUSCULAR | Status: DC | PRN

## 2017-06-14 SURGICAL SUPPLY — 14 items

## 2017-06-14 NOTE — Interval H&P Note (Signed)
History and Physical Interval Note:  06/14/2017 9:35 AM  Alisha [name]  has presented today for surgery, with the diagnosis of dysphagia/reflux  The various methods of treatment have been discussed with the patient and family. After consideration of risks, benefits and other options for treatment, the patient has consented to  Procedure(s): ESOPHAGOGASTRODUODENOSCOPY (EGD) WITH PROPOFOL (N/A) SAVORY DILATION (N/A) as a surgical intervention .  The patient's history has been reviewed, patient examined, no change in status, stable for surgery.  I have reviewed the patient's chart and labs.  Questions were answered to the patient's satisfaction.     [place] C.

## 2017-06-14 NOTE — Anesthesia Preprocedure Evaluation (Addendum)
Anesthesia Evaluation  Patient identified by MRN, date of birth, ID band Patient awake    Reviewed: Allergy & Precautions, NPO status , Patient's Chart, lab work & pertinent test results, reviewed documented beta blocker date and time   Airway Mallampati: III  TM Distance: >3 FB Neck ROM: Full    Dental  (+) Poor Dentition, Edentulous Upper, Missing   Pulmonary asthma , sleep apnea , pneumonia, resolved,    Pulmonary exam normal breath sounds clear to auscultation       Cardiovascular hypertension, Pt. on medications and Pt. on home beta blockers + CAD, + Past MI, + Cardiac Stents and + Peripheral Vascular Disease  Normal cardiovascular exam+ Valvular Problems/Murmurs  Rhythm:Regular Rate:Normal  Hx/o DVT RCA occluded, Stent LAD x 2 Hx/o MI   Neuro/Psych  Headaches, Seizures -, Well Controlled,  PSYCHIATRIC DISORDERS Depression Bipolar Disorder Dysthymic disorder Neuromuscular disease    GI/Hepatic Neg liver ROS, GERD  Medicated and Controlled,Dysphagia    Endo/Other  Morbid obesityHx/o Pituitary tumor Hyperlipidemia  Renal/GU Hx/o renal calculi Bladder dysfunction  Overactive bladder    Musculoskeletal  (+) Arthritis , Fibromyalgia -Psoriatic arthritis   Abdominal (+) + obese,   Peds  Hematology Brilinta- last dose 10/12   Anesthesia Other Findings   Reproductive/Obstetrics Hx/o cervical Ca                           Anesthesia Physical Anesthesia Plan  ASA: III  Anesthesia Plan: MAC   Post-op Pain Management:    Induction: Intravenous  PONV Risk Score and Plan: Midazolam, Propofol infusion, Ondansetron and Treatment may vary due to age or medical condition  Airway Management Planned: Natural Airway, Simple Face Mask and Nasal Cannula  Additional Equipment:   Intra-op Plan:   Post-operative Plan:   Informed Consent: I have reviewed the patients History and Physical, chart,  labs and discussed the procedure including the risks, benefits and alternatives for the proposed anesthesia with the patient or authorized representative who has indicated his/her understanding and acceptance.   Dental advisory given  Plan Discussed with: CRNA, Anesthesiologist and Surgeon  Anesthesia Plan Comments:         Anesthesia Quick Evaluation

## 2017-06-14 NOTE — H&P (Signed)
Date of Initial H&P: 06/06/17  History reviewed, patient examined, no change in status, stable for surgery.

## 2017-06-14 NOTE — Discharge Instructions (Signed)

## 2017-06-14 NOTE — Op Note (Addendum)
[facility] Patient Name: Alisha [name] Procedure Date : 06/14/2017 MRN: [id number] Attending MD: Lear Ng , MD Date of Birth: March 08, 1951 CSN: [id number] Age: 66 Admit Type: Outpatient Procedure:                Upper GI endoscopy Indications:              Dysphagia, Esophageal reflux Providers:                Lear Ng, MD, Cleda Daub, RN, Elspeth Cho Tech., Technician Referring MD:              Medicines:                Propofol per Anesthesia, Monitored Anesthesia Care Complications:            No immediate complications. Estimated Blood Loss:     Estimated blood loss: none. Procedure:                Pre-Anesthesia Assessment:                           - Prior to the procedure, a History and Physical                            was performed, and patient medications and                            allergies were reviewed. The patient's tolerance of                            previous anesthesia was also reviewed. The risks                            and benefits of the procedure and the sedation                            options and risks were discussed with the patient.                            All questions were answered, and informed consent                            was obtained. Prior Anticoagulants: The patient has                            taken Brilinta, last dose was 5 days prior to                            procedure. ASA Grade Assessment: III - A patient                            with severe systemic disease. After reviewing the  risks and benefits, the patient was deemed in                            satisfactory condition to undergo the procedure.                           After obtaining informed consent, the endoscope was                            passed under direct vision. Throughout the                            procedure, the patient's blood pressure, pulse,  and                            oxygen saturations were monitored continuously. The                            [id number] ([id number]) scope was introduced through the                            mouth, and advanced to the second part of duodenum.                            The upper GI endoscopy was accomplished without                            difficulty. The patient tolerated the procedure                            well. Scope In: Scope Out: Findings:      The examined esophagus was normal.      The Z-line was regular and was found 40 cm from the incisors.      Segmental minimal inflammation characterized by congestion (edema) and       erythema was found in the gastric antrum.      The cardia and gastric fundus were normal on retroflexion.      The exam of the stomach was otherwise normal.      The examined duodenum was normal. Impression:               - Normal esophagus.                           - Z-line regular, 40 cm from the incisors.                           - Acute gastritis.                           - Normal examined duodenum.                           - No specimens collected. Moderate Sedation:      N/A - MAC procedure Recommendation:           - Patient  has a contact number available for                            emergencies. The signs and symptoms of potential                            delayed complications were discussed with the                            patient. Return to normal activities tomorrow.                            Written discharge instructions were provided to the                            patient.                           - Follow an antireflux regimen.                           - Post procedure medication orders were given.                           - Resume Brilinta (Ticagrelor) at prior dose today.                            Refer to managing physician for further adjustment                            of therapy. Procedure Code(s):        ---  Professional ---                           [phone], Esophagogastroduodenoscopy, flexible,                            transoral; diagnostic, including collection of                            specimen(s) by brushing or washing, when performed                            (separate procedure) Diagnosis Code(s):        --- Professional ---                           K21.9, Gastro-esophageal reflux disease without                            esophagitis                           R13.10, Dysphagia, unspecified                           K29.00, Acute gastritis without bleeding CPT copyright 2016  American Medical Association. All rights reserved. The codes documented in this report are preliminary and upon coder review may  be revised to meet current compliance requirements. Lear Ng, MD 06/14/2017 10:16:04 AM This report has been signed electronically. Number of Addenda: 0

## 2017-06-14 NOTE — Anesthesia Postprocedure Evaluation (Signed)
Anesthesia Post Note  Patient: Alisha [name]  Procedure(s) Performed: ESOPHAGOGASTRODUODENOSCOPY (EGD) WITH PROPOFOL (N/A ) SAVORY DILATION (N/A )     Patient location during evaluation: Endoscopy Anesthesia Type: MAC Level of consciousness: awake and alert and oriented Pain management: pain level controlled Vital Signs Assessment: post-procedure vital signs reviewed and stable Respiratory status: spontaneous breathing, nonlabored ventilation and respiratory function stable Cardiovascular status: stable and blood pressure returned to baseline Postop Assessment: no apparent nausea or vomiting Anesthetic complications: no    Last Vitals:  Vitals:   06/14/17 0828 06/14/17 1010  BP: (!) 194/86 (!) 118/102  Pulse: (!) 43   Resp: 17 16  Temp: 36.9 C 36.4 C  SpO2: 100% 100%    Last Pain:  Vitals:   06/14/17 1010  TempSrc: Oral                 Alisha [name] A.

## 2017-06-14 NOTE — Transfer of Care (Signed)
Immediate Anesthesia Transfer of Care Note  Patient: Alisha [name]  Procedure(s) Performed: ESOPHAGOGASTRODUODENOSCOPY (EGD) WITH PROPOFOL (N/A ) SAVORY DILATION (N/A )  Patient Location: Endoscopy Unit  Anesthesia Type:MAC  Level of Consciousness: awake, oriented and patient cooperative  Airway & Oxygen Therapy: Patient Spontanous Breathing and Patient connected to nasal cannula oxygen  Post-op Assessment: Report given to RN and Post -op Vital signs reviewed and stable  Post vital signs: Reviewed and stable  Last Vitals:  Vitals:   06/14/17 0828  BP: (!) 194/86  Pulse: (!) 43  Resp: 17  Temp: 36.9 C  SpO2: 100%    Last Pain:  Vitals:   06/14/17 0828  TempSrc: Oral         Complications: No apparent anesthesia complications

## 2017-06-15 ENCOUNTER — Encounter (HOSPITAL_COMMUNITY): Payer: Self-pay | Admitting: Gastroenterology

## 2017-06-19 ENCOUNTER — Telehealth: Payer: Self-pay

## 2017-06-19 NOTE — Telephone Encounter (Signed)
Faxed [facility] form on 06/19/2017 @ [phone].

## 2017-06-20 ENCOUNTER — Ambulatory Visit (INDEPENDENT_AMBULATORY_CARE_PROVIDER_SITE_OTHER): Payer: Medicare Other | Admitting: Orthotics

## 2017-06-20 ENCOUNTER — Telehealth: Payer: Self-pay

## 2017-06-20 DIAGNOSIS — M84375S Stress fracture, left foot, sequela: Secondary | ICD-10-CM | POA: Diagnosis not present

## 2017-06-20 DIAGNOSIS — R2681 Unsteadiness on feet: Secondary | ICD-10-CM

## 2017-06-20 NOTE — Telephone Encounter (Signed)
Rcvd 90 day rqst for tizanidine from [facility] [address] Called Pt to ask if she rqstd, she said the pharmacy called her to ask if she wanted a 90 refill and she agreed. I reminded her the med was not available and we had to substitute Baclofen. Called Pharmacy, spoke with Cristie Hem, asked if tizanidine is available, he states is still on back order, I asked why Pt was contacted for 90 refill for a medication that wasn't available, he did not know. Advsd him to keep Pt on 30 day supply of Baclofen until tizanidine is available, then we can fill for 90. The pharmacy will advise Pt.

## 2017-06-20 NOTE — Addendum Note (Signed)
Addended by: Velora Heckler on: 06/20/2017 03:05 PM   Modules accepted: Level of Service

## 2017-06-20 NOTE — Progress Notes (Signed)
Patient came in today to pick up standard Afo brace.  Patient was evaluated for fit and function.   The brace fit very well and there were any complaints of the way it felt once donned.  The brace offered ankle stability in both saggital and coroneal planes.  Patient advised to always wear proper fitting shoes with brace. 

## 2017-06-25 DIAGNOSIS — J45909 Unspecified asthma, uncomplicated: Secondary | ICD-10-CM | POA: Diagnosis not present

## 2017-06-25 DIAGNOSIS — J42 Unspecified chronic bronchitis: Secondary | ICD-10-CM | POA: Diagnosis not present

## 2017-07-05 ENCOUNTER — Other Ambulatory Visit: Payer: Self-pay

## 2017-07-05 MED ORDER — AMLODIPINE BESYLATE 10 MG PO TABS: 10.0000 mg | ORAL_TABLET | Freq: Every day | ORAL | 2 refills | Status: DC

## 2017-07-06 ENCOUNTER — Other Ambulatory Visit: Payer: Self-pay | Admitting: *Deleted

## 2017-07-06 MED ORDER — LOSARTAN POTASSIUM 100 MG PO TABS: 100.0000 mg | ORAL_TABLET | Freq: Every day | ORAL | 0 refills | Status: AC

## 2017-07-09 ENCOUNTER — Ambulatory Visit (HOSPITAL_BASED_OUTPATIENT_CLINIC_OR_DEPARTMENT_OTHER): Payer: Medicare Other | Attending: Cardiology | Admitting: Cardiology

## 2017-07-09 VITALS — Ht 66.0 in | Wt 293.0 lb

## 2017-07-09 DIAGNOSIS — G4733 Obstructive sleep apnea (adult) (pediatric): Secondary | ICD-10-CM | POA: Diagnosis not present

## 2017-07-11 DIAGNOSIS — H35033 Hypertensive retinopathy, bilateral: Secondary | ICD-10-CM | POA: Insufficient documentation

## 2017-07-11 DIAGNOSIS — H269 Unspecified cataract: Secondary | ICD-10-CM | POA: Insufficient documentation

## 2017-07-11 NOTE — Assessment & Plan Note (Addendum)
Was referred to [facility] rheumatology-saw  Dr Trudie Reed- no records of visit, will need to obtain Records.

## 2017-07-11 NOTE — Assessment & Plan Note (Addendum)
Saw Dr Michail Sermon, EGD in October noted acute gastritis but no esophagitis.  No outpatient visit note in Epic. -Will try to obtain

## 2017-07-12 ENCOUNTER — Other Ambulatory Visit: Payer: Self-pay

## 2017-07-12 ENCOUNTER — Ambulatory Visit: Payer: Medicare Other | Admitting: Podiatry

## 2017-07-12 ENCOUNTER — Ambulatory Visit (INDEPENDENT_AMBULATORY_CARE_PROVIDER_SITE_OTHER): Payer: Medicare Other | Admitting: Internal Medicine

## 2017-07-12 ENCOUNTER — Encounter: Payer: Self-pay | Admitting: Internal Medicine

## 2017-07-12 VITALS — BP 160/79 | HR 62 | Temp 97.5°F | Ht 66.0 in | Wt 294.7 lb

## 2017-07-12 DIAGNOSIS — K219 Gastro-esophageal reflux disease without esophagitis: Secondary | ICD-10-CM

## 2017-07-12 DIAGNOSIS — M8000XA Age-related osteoporosis with current pathological fracture, unspecified site, initial encounter for fracture: Secondary | ICD-10-CM | POA: Diagnosis not present

## 2017-07-12 DIAGNOSIS — Z1239 Encounter for other screening for malignant neoplasm of breast: Secondary | ICD-10-CM

## 2017-07-12 DIAGNOSIS — I10 Essential (primary) hypertension: Secondary | ICD-10-CM

## 2017-07-12 DIAGNOSIS — Z79891 Long term (current) use of opiate analgesic: Secondary | ICD-10-CM

## 2017-07-12 DIAGNOSIS — N3946 Mixed incontinence: Secondary | ICD-10-CM | POA: Diagnosis not present

## 2017-07-12 DIAGNOSIS — Z79899 Other long term (current) drug therapy: Secondary | ICD-10-CM

## 2017-07-12 DIAGNOSIS — E2839 Other primary ovarian failure: Secondary | ICD-10-CM

## 2017-07-12 DIAGNOSIS — M80072A Age-related osteoporosis with current pathological fracture, left ankle and foot, initial encounter for fracture: Secondary | ICD-10-CM

## 2017-07-12 DIAGNOSIS — L4050 Arthropathic psoriasis, unspecified: Secondary | ICD-10-CM | POA: Diagnosis not present

## 2017-07-12 DIAGNOSIS — H269 Unspecified cataract: Secondary | ICD-10-CM

## 2017-07-12 DIAGNOSIS — Z86010 Personal history of colonic polyps: Secondary | ICD-10-CM | POA: Diagnosis not present

## 2017-07-12 DIAGNOSIS — H35033 Hypertensive retinopathy, bilateral: Secondary | ICD-10-CM | POA: Diagnosis not present

## 2017-07-12 DIAGNOSIS — G894 Chronic pain syndrome: Secondary | ICD-10-CM | POA: Diagnosis not present

## 2017-07-12 DIAGNOSIS — Z8781 Personal history of (healed) traumatic fracture: Secondary | ICD-10-CM | POA: Insufficient documentation

## 2017-07-12 DIAGNOSIS — G4733 Obstructive sleep apnea (adult) (pediatric): Secondary | ICD-10-CM

## 2017-07-12 DIAGNOSIS — Z7951 Long term (current) use of inhaled steroids: Secondary | ICD-10-CM

## 2017-07-12 DIAGNOSIS — R32 Unspecified urinary incontinence: Secondary | ICD-10-CM

## 2017-07-12 DIAGNOSIS — J4540 Moderate persistent asthma, uncomplicated: Secondary | ICD-10-CM

## 2017-07-12 DIAGNOSIS — E559 Vitamin D deficiency, unspecified: Secondary | ICD-10-CM | POA: Diagnosis not present

## 2017-07-12 MED ORDER — MORPHINE SULFATE ER 15 MG PO TBCR: 15.0000 mg | EXTENDED_RELEASE_TABLET | Freq: Two times a day (BID) | ORAL | 0 refills | Status: DC

## 2017-07-12 MED ORDER — PROAIR HFA 108 (90 BASE) MCG/ACT IN AERS: 1.0000 | INHALATION_SPRAY | Freq: Four times a day (QID) | RESPIRATORY_TRACT | 5 refills | Status: DC | PRN

## 2017-07-12 MED ORDER — OXYCODONE-ACETAMINOPHEN 5-325 MG PO TABS: 1.0000 | ORAL_TABLET | Freq: Three times a day (TID) | ORAL | 0 refills | Status: DC | PRN

## 2017-07-12 MED ORDER — ALENDRONATE SODIUM 70 MG PO TABS: 70.0000 mg | ORAL_TABLET | ORAL | 3 refills | Status: DC

## 2017-07-12 NOTE — Assessment & Plan Note (Signed)
-  Noted by Dr Kathlen Mody in September, has follow up scheduled

## 2017-07-12 NOTE — Assessment & Plan Note (Signed)
HPI: Seen last month by Dr Jacqualyn Posey for foot pain, found to have stress fracture of left 5th metatarsal.  A: Osteoporosis with stress fracture  P: Discussed dx of osteoporosis, will check vitamin d and DEXA go ahead and start fosamax 70mg  weekly, discussed dosing instructions.

## 2017-07-12 NOTE — Assessment & Plan Note (Signed)
Review of outside records, has had colonoscopy in 2014- 1cm tubulovillous adenoma, per records repeat in 3 years- patient is now seeing Dr Michail Sermon, she reports he plan for a repeat screening colonoscopy but is not sure on timeline.

## 2017-07-12 NOTE — Patient Instructions (Signed)
I am starting you on alendronate for you bone health.  You will take this once a week.  I want you to get a DEXA scan and your mammogram.

## 2017-07-12 NOTE — Assessment & Plan Note (Signed)
HPI: Continues to have signficant pain, reports MSCotin is helping, cymbalta has also helped some.  She does not feel that the oxycodone is helping much for breakthrough pain.  Feels increased pain is due to colder weather.  Has not been walking more outside as we discussed but feels she is able to play with grandkids by using the medication.  A: Chronic pain syndrome on long term opioid therapy  P: Reviewed [place] PMP database- appropirate -obtain UDS today - Refilled 2 months of MSCotin 15mg  BID and Oxycodone 5-325 #30. -If not hitting more functional goals in the furture will likely need to titrate down.

## 2017-07-12 NOTE — Assessment & Plan Note (Signed)
HPI; BP elevated today, reports it is due to increased pain.  Reports complaince with medications  A: Essential HTN, not at goal  P: -has been controlled at previous visits, had planned to repeat BP at end of visit but I think she left without this happening. - For now will plan to continue bystolic, amlodipine, losartan, imdur, and lasix. If remains uncontrolled will need to consider addition of spironolactone.

## 2017-07-12 NOTE — Assessment & Plan Note (Signed)
HPI: her main complaint is urinary incontience today,  It appears she was previously diagnosied with OAB and she is on myrbetriq.  She notes that she has seen a Urology many years ago in [place], was told to do kegal exercises and started the myrbetriq.  However more recently she notes that has had increased incontience with inability to control when she voids. She is using adult diabpers, at least 3 a day.  Recent sleep study did not she urinated in her diaper overngiht without waking to go to bathroom.    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

## 2017-07-12 NOTE — Assessment & Plan Note (Signed)
-  Noted by Dr Kathlen Mody in September, has follow up scheduled -Need better BP control

## 2017-07-12 NOTE — Assessment & Plan Note (Signed)
HPI: Recent sleep study completed by Dr Champ Mungo, appears it was a split night study and did control OSA but will need second part CPAP titration.  A: OSA  P:Discussed results, if she does not hear from dr turner's office in the next 2 weeks I asked her to call us.

## 2017-07-12 NOTE — Progress Notes (Addendum)
[facility] Subjective:  HPI: [name] is a 66 y.o. female who presents for f/u of chronic pain, HTN, asthma, GERD  Please see Assessment and Plan below for the status of her chronic medical problems.  Review of Systems: Per HPI Objective:  Physical Exam: Vitals:   07/12/17 0953  BP: (!) 160/79  Pulse: 62  Temp: (!) 97.5 F (36.4 C)  TempSrc: Oral  SpO2: 100%  Weight: 294 lb 11.2 oz (133.7 kg)  Height: 5\' 6"  (1.676 m)  Physical Exam  Constitutional: No distress.  In motorized wheelchair  HENT:  Head: Normocephalic and atraumatic.  Cardiovascular: Normal rate and regular rhythm.  Pulmonary/Chest: Effort normal and breath sounds normal. She has no wheezes.  Abdominal: Soft. Bowel sounds are normal.  Musculoskeletal: She exhibits no edema.       Right shoulder: She exhibits decreased range of motion (able to get to about 120degrees of abduction) and tenderness (shoulder and scapular regions).       Left shoulder: She exhibits no tenderness.       Cervical back: She exhibits tenderness.       Thoracic back: She exhibits tenderness.       Lumbar back: She exhibits tenderness.  Nursing note and vitals reviewed.   Assessment & Plan:  Psoriatic arthritis [facility]) Was referred to [facility] rheumatology-saw  Dr Trudie Reed- no records of visit, will need to obtain Records.  Gastroesophageal reflux disease without esophagitis Saw Dr Michail Sermon, EGD in October noted acute gastritis but no esophagitis.  No outpatient visit note in Epic. -Will try to obtain  Essential hypertension HPI; BP elevated today, reports it is due to increased pain.  Reports complaince with medications  A: Essential HTN, not at goal  P: -has been controlled at previous visits, had planned to repeat BP at end of visit but I think she left without this happening. - For now will plan to continue bystolic, amlodipine, losartan, imdur, and lasix. If remains uncontrolled will need to  consider addition of spironolactone.  OSA (obstructive sleep apnea) HPI: Recent sleep study completed by Dr Champ Mungo, appears it was a split night study and did control OSA but will need second part CPAP titration.  A: OSA  P:Discussed results, if she does not hear from dr turner's office in the next 2 weeks I asked her to call us.  Chronic pain syndrome HPI: Continues to have signficant pain, reports MSCotin is helping, cymbalta has also helped some.  She does not feel that the oxycodone is helping much for breakthrough pain.  Feels increased pain is due to colder weather.  Has not been walking more outside as we discussed but feels she is able to play with grandkids by using the medication.  A: Chronic pain syndrome on long term opioid therapy  P: Reviewed [place] PMP database- appropirate -obtain UDS today - Refilled 2 months of MSCotin 15mg  BID and Oxycodone 5-325 #30. -If not hitting more functional goals in the furture will likely need to titrate down.  Cataract of both eyes -Noted by Dr Kathlen Mody in September, has follow up scheduled  Hypertensive retinopathy of both eyes -Noted by Dr Kathlen Mody in September, has follow up scheduled -Need better BP control  Urinary incontinence in female HPI: her main complaint is urinary incontience today,  It appears she was previously diagnosied with OAB and she is on myrbetriq.  She notes that she has seen a Urology many years ago in [place], was told to do kegal exercises and  started the myrbetriq.  However more recently she notes that has had increased incontience with inability to control when she voids. She is using adult diabpers, at least 3 a day.  Recent sleep study did not she urinated in her diaper overngiht without waking to go to bathroom.    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  Osteoporosis with current pathological fracture HPI: Seen last month by Dr Jacqualyn Posey for foot pain, found to  have stress fracture of left 5th metatarsal.  A: Osteoporosis with stress fracture  P: Discussed dx of osteoporosis, will check vitamin d and DEXA go ahead and start fosamax 70mg  weekly, discussed dosing instructions.  History of adenomatous polyp of colon Review of outside records, has had colonoscopy in 2014- 1cm tubulovillous adenoma, per records repeat in 3 years- patient is now seeing Dr Michail Sermon, she reports he plan for a repeat screening colonoscopy but is not sure on timeline.  Vitamin D deficiency Vitamin D down to 16. Will start on D2 50000 weekly for 12 weeks, will then need to change to D3 2000 units daily.  Moderate persistent asthma without complication Using advair and albuterol. Notes having to use nebulizer because Ventolin does not work for her, Ship broker worked better.  -Continue Advair, Rx proAir DAW   Medications Ordered Meds ordered this encounter  Medications  . PROAIR HFA 108 (90 Base) MCG/ACT inhaler    Sig: Inhale 1-2 puffs every 6 (six) hours as needed into the lungs for wheezing or shortness of breath.    Dispense:  8 g    Refill:  5  . DISCONTD: morphine (MS CONTIN) 15 MG 12 hr tablet    Sig: Take 1 tablet (15 mg total) every 12 (twelve) hours by mouth.    Dispense:  60 tablet    Refill:  0    Rx 1/2  . morphine (MS CONTIN) 15 MG 12 hr tablet    Sig: Take 1 tablet (15 mg total) every 12 (twelve) hours by mouth.    Dispense:  60 tablet    Refill:  0    Rx 2/2  . DISCONTD: oxyCODONE-acetaminophen (ROXICET) 5-325 MG tablet    Sig: Take 1 tablet every 8 (eight) hours as needed by mouth for severe pain (breakthrough).    Dispense:  30 tablet    Refill:  0    Rx 1/2 Please fill 30 days after last Rx  . oxyCODONE-acetaminophen (ROXICET) 5-325 MG tablet    Sig: Take 1 tablet every 8 (eight) hours as needed by mouth for severe pain (breakthrough).    Dispense:  30 tablet    Refill:  0    Rx 2/2 Please fill 30 days after last Rx  . alendronate (FOSAMAX)  70 MG tablet    Sig: Take 1 tablet (70 mg total) every 7 (seven) days by mouth. Take with a full glass of water on an empty stomach.    Dispense:  12 tablet    Refill:  3  . Vitamin D, Ergocalciferol, (DRISDOL) 50000 units CAPS capsule    Sig: Take 1 capsule (50,000 Units total) every 7 (seven) days by mouth.    Dispense:  12 capsule    Refill:  0   Other Orders Orders Placed This Encounter  Procedures  . DG Bone Density    Standing Status:   Future    Standing Expiration Date:   09/11/2017    Order Specific Question:  Reason for Exam (SYMPTOM  OR DIAGNOSIS REQUIRED)    Answer:   Osteoporotic stress fracture    Order Specific Question:   Preferred imaging location?    Answer:   [facility]  . MM Digital Screening    Standing Status:   Future    Standing Expiration Date:   09/11/2017    Order Specific Question:   Reason for Exam (SYMPTOM  OR DIAGNOSIS REQUIRED)    Answer:   breast cancer screening    Order Specific Question:   Preferred imaging location?    Answer:   [facility]  . ToxAssure Select,+Antidepr,UR  . Vitamin D (25 hydroxy)  . HM HEPATITIS C SCREENING LAB    This external order was created through the Results Console.  . Ambulatory referral to Urology    Referral Priority:   Routine    Referral Type:   Consultation    Referral Reason:   Specialty Services Required    Requested Specialty:   Urology    Number of Visits Requested:   1  . HM COLONOSCOPY    This external order was created through the Results Console.   Follow Up: Return in about 2 months (around 09/11/2017).

## 2017-07-13 DIAGNOSIS — E559 Vitamin D deficiency, unspecified: Secondary | ICD-10-CM | POA: Insufficient documentation

## 2017-07-13 LAB — VITAMIN D 25 HYDROXY (VIT D DEFICIENCY, FRACTURES): Vit D, 25-Hydroxy: 16.3 ng/mL — ABNORMAL LOW (ref 30.0–100.0)

## 2017-07-13 MED ORDER — VITAMIN D (ERGOCALCIFEROL) 1.25 MG (50000 UNIT) PO CAPS: 50000.0000 [IU] | ORAL_CAPSULE | ORAL | 0 refills | Status: DC

## 2017-07-13 NOTE — Assessment & Plan Note (Signed)
Using advair and albuterol. Notes having to use nebulizer because Ventolin does not work for her, Ship broker worked better.  -Continue Advair, Rx proAir [employer]

## 2017-07-13 NOTE — Addendum Note (Signed)
Addended by: Lucious Groves on: 07/13/2017 08:59 AM   Modules accepted: Orders

## 2017-07-13 NOTE — Assessment & Plan Note (Signed)
Vitamin D down to 16. Will start on D2 50000 weekly for 12 weeks, will then need to change to D3 2000 units daily.

## 2017-07-17 ENCOUNTER — Other Ambulatory Visit: Payer: Self-pay | Admitting: Internal Medicine

## 2017-07-17 ENCOUNTER — Telehealth: Payer: Self-pay | Admitting: *Deleted

## 2017-07-17 DIAGNOSIS — Z1231 Encounter for screening mammogram for malignant neoplasm of breast: Secondary | ICD-10-CM

## 2017-07-17 NOTE — Telephone Encounter (Signed)
I changed the associated diagosis

## 2017-07-17 NOTE — Telephone Encounter (Signed)
Call from [facility] - need to change dx for bone density to Estrogen deficiency or osteopenia. Insurance will not cover osteoporortic stress fracture. Then she will call the pt to schedule the appt. Thanks

## 2017-07-18 DIAGNOSIS — J45909 Unspecified asthma, uncomplicated: Secondary | ICD-10-CM | POA: Diagnosis not present

## 2017-07-18 DIAGNOSIS — J42 Unspecified chronic bronchitis: Secondary | ICD-10-CM | POA: Diagnosis not present

## 2017-07-19 LAB — TOXASSURE SELECT,+ANTIDEPR,UR

## 2017-07-23 NOTE — Procedures (Signed)
   Patient Name: Alisha [name], Alisha [name] Date: 07/09/2017 Gender: Female D.O.B: Jan 16, 1951 Age (years): 20 Referring Provider: Fransico Him MD, ABSM Height (inches): 66 Interpreting Physician: Fransico Him MD, ABSM Weight (lbs): 293 RPSGT: Carolin Coy BMI: 76 MRN: [id number] Neck Size: 17.50  CLINICAL INFORMATION Sleep Study Type: NPSG  Indication for sleep study: Fatigue, Hypertension, Obesity, OSA  Epworth Sleepiness Score: 7  SLEEP STUDY TECHNIQUE As per the AASM Manual for the Scoring of Sleep and Associated Events v2.3 (April 2016) with a hypopnea requiring 4% desaturations.  The channels recorded and monitored were frontal, central and occipital EEG, electrooculogram (EOG), submentalis EMG (chin), nasal and oral airflow, thoracic and abdominal wall motion, anterior tibialis EMG, snore microphone, electrocardiogram, and pulse oximetry.  MEDICATIONS Medications self-administered by patient taken the night of the study : N/A  SLEEP ARCHITECTURE The study was initiated at 11:02:32 PM and ended at 5:09:13 AM.  Sleep onset time was 103.4 minutes and the sleep efficiency was 29.2%. The total sleep time was 107.0 minutes.  Stage REM latency was N/A minutes.  The patient spent 35.98% of the night in stage N1 sleep, 64.02% in stage N2 sleep, 0.00% in stage N3 and 0.00% in REM.  Alpha intrusion was absent.  Supine sleep was 100.00%.  RESPIRATORY PARAMETERS The overall apnea/hypopnea index (AHI) was 7.3 per hour. There were 0 total apneas, including 0 obstructive, 0 central and 0 mixed apneas. There were 13 hypopneas and 13 RERAs.  The AHI during Stage REM sleep was N/A per hour.  AHI while supine was 7.3 per hour.  The mean oxygen saturation was 95.79%. The minimum SpO2 during sleep was 88.00%.  moderate snoring was noted during this study.  CARDIAC DATA The 2 lead EKG demonstrated sinus rhythm. The mean heart rate was 52.77 beats per minute. Other EKG  findings include: None.  LEG MOVEMENT DATA The total PLMS were 4 with a resulting PLMS index of 2.24. Associated arousal with leg movement index was 0.6 .  IMPRESSIONS - Mild obstructive sleep apnea occurred during this study (AHI = 7.3/h). - No significant central sleep apnea occurred during this study (CAI = 0.0/h). - Mild oxygen desaturation was noted during this study (Min O2 = 88.00%). - The patient snored with moderate snoring volume. - No cardiac abnormalities were noted during this study. - Clinically significant periodic limb movements did not occur during sleep. No significant associated arousals.  DIAGNOSIS - Obstructive Sleep Apnea (327.23 [G47.33 ICD-10])  RECOMMENDATIONS - Very mild obstructive sleep apnea. Return to discuss treatment options. - Avoid alcohol, sedatives and other CNS depressants that may worsen sleep apnea and disrupt normal sleep architecture. - Sleep hygiene should be reviewed to assess factors that may improve sleep quality. - Weight management and regular exercise should be initiated or continued if appropriate.  [Electronically signed] 07/23/2017 10:11 PM  Fransico Him MD, ABSM Diplomate, American Board of Sleep Medicine   NPI: [id number] [place], [place] [facility]  ELECTRONICALLY SIGNED ON:  07/23/2017, 10:13 PM [place] PH: [phone]   FX: [phone] [place]

## 2017-07-25 ENCOUNTER — Telehealth: Payer: Self-pay | Admitting: *Deleted

## 2017-07-25 NOTE — Telephone Encounter (Signed)
-----   Message from [name], MD sent at 07/23/2017 10:17 PM EST ----- Please let patient know that she has very mild OSA and set up OV to discuss treatment options

## 2017-07-25 NOTE — Telephone Encounter (Signed)
Informed patient of sleep study results and patient understanding was verbalized. Patient understands she has very mild sleep apnea and that Dr Radford Pax would like to set up an office visit to discuss treatment options. Patient is scheduled for September 28 2017 at 8:00 am. Patient agrees with treatment and thanked me for calling.

## 2017-07-26 ENCOUNTER — Ambulatory Visit (INDEPENDENT_AMBULATORY_CARE_PROVIDER_SITE_OTHER): Payer: Medicare Other

## 2017-07-26 ENCOUNTER — Ambulatory Visit (INDEPENDENT_AMBULATORY_CARE_PROVIDER_SITE_OTHER): Payer: Medicare Other | Admitting: Podiatry

## 2017-07-26 ENCOUNTER — Encounter: Payer: Self-pay | Admitting: Podiatry

## 2017-07-26 DIAGNOSIS — J42 Unspecified chronic bronchitis: Secondary | ICD-10-CM | POA: Diagnosis not present

## 2017-07-26 DIAGNOSIS — M84375S Stress fracture, left foot, sequela: Secondary | ICD-10-CM

## 2017-07-26 DIAGNOSIS — R269 Unspecified abnormalities of gait and mobility: Secondary | ICD-10-CM | POA: Diagnosis not present

## 2017-07-26 DIAGNOSIS — J45909 Unspecified asthma, uncomplicated: Secondary | ICD-10-CM | POA: Diagnosis not present

## 2017-07-26 DIAGNOSIS — M659 Synovitis and tenosynovitis, unspecified: Secondary | ICD-10-CM

## 2017-07-26 DIAGNOSIS — M19079 Primary osteoarthritis, unspecified ankle and foot: Secondary | ICD-10-CM

## 2017-07-27 ENCOUNTER — Telehealth: Payer: Self-pay | Admitting: *Deleted

## 2017-07-27 DIAGNOSIS — R350 Frequency of micturition: Secondary | ICD-10-CM | POA: Diagnosis not present

## 2017-07-27 DIAGNOSIS — R2681 Unsteadiness on feet: Secondary | ICD-10-CM

## 2017-07-27 NOTE — Telephone Encounter (Signed)
Dr. Jacqualyn Posey ordered PT for Gait Training. Orders to [facility].

## 2017-07-27 NOTE — Progress Notes (Signed)
Subjective: Alisha [name] presents the office today for follow-up evaluationazella of bilateral foot pain on the left side worse than right.  He states that she has not been wearing the boot on the left side but she was wearing the brace and it was helping quite a bit.  She did misplace the brace but she has found that she has not been wearing it.  She also states that she is been having some balance issues over the last couple years and she has a fear of falling.  She denies any recent falls. Denies any systemic complaints such as fevers, chills, nausea, vomiting. No acute changes since last appointment, and no other complaints at this time.   Objective: AAO x3, NAD, presents today in a motorized scooter DP/PT pulses palpable bilaterally, CRT less than 3 seconds Majority of tenderness appears to be on the dorsal aspect the left foot along the Lisfranc joint.  There is no specific area pinpoint tenderness there is no pain to vibratory sensation.  There is no significant increase in edema, erythema, increase in warmth bilaterally.  Mild diffuse tenderness to bilateral lower extremities but no other specific area of tenderness. No open lesions or pre-ulcerative lesions.  No pain with calf compression, swelling, warmth, erythema  Assessment: Significant osteoarthritis bilateral feet left side worse than right  Plan: -All treatment options discussed with the patient including all alternatives, risks, complications.  -X-rays were obtained and reviewed grossly appear to be unchanged.  Subacute fracture of the fifth metatarsal base on the left side -At this point on her to continue with the brace to the left side.  Also given her balance issues I would like for her to start some gait training as well.  We will get her to physical therapy hopefully next week with [name] for [name] at her convenience. -She is asking for prescription for a cane and this was provided for her today. -Patient encouraged to call the  office with any questions, concerns, change in symptoms.   Trula Slade DPM

## 2017-08-03 DIAGNOSIS — R269 Unspecified abnormalities of gait and mobility: Secondary | ICD-10-CM | POA: Diagnosis not present

## 2017-08-03 DIAGNOSIS — M6281 Muscle weakness (generalized): Secondary | ICD-10-CM | POA: Diagnosis not present

## 2017-08-03 DIAGNOSIS — R296 Repeated falls: Secondary | ICD-10-CM | POA: Diagnosis not present

## 2017-08-03 DIAGNOSIS — M25572 Pain in left ankle and joints of left foot: Secondary | ICD-10-CM | POA: Diagnosis not present

## 2017-08-10 DIAGNOSIS — J42 Unspecified chronic bronchitis: Secondary | ICD-10-CM | POA: Diagnosis not present

## 2017-08-10 DIAGNOSIS — J45909 Unspecified asthma, uncomplicated: Secondary | ICD-10-CM | POA: Diagnosis not present

## 2017-08-13 DIAGNOSIS — R269 Unspecified abnormalities of gait and mobility: Secondary | ICD-10-CM | POA: Diagnosis not present

## 2017-08-13 DIAGNOSIS — R296 Repeated falls: Secondary | ICD-10-CM | POA: Diagnosis not present

## 2017-08-13 DIAGNOSIS — M25572 Pain in left ankle and joints of left foot: Secondary | ICD-10-CM | POA: Diagnosis not present

## 2017-08-13 DIAGNOSIS — M6281 Muscle weakness (generalized): Secondary | ICD-10-CM | POA: Diagnosis not present

## 2017-08-15 ENCOUNTER — Encounter: Payer: Self-pay | Admitting: Internal Medicine

## 2017-08-16 ENCOUNTER — Other Ambulatory Visit: Payer: Self-pay | Admitting: *Deleted

## 2017-08-16 MED ORDER — VITAMIN D (ERGOCALCIFEROL) 1.25 MG (50000 UNIT) PO CAPS: 50000.0000 [IU] | ORAL_CAPSULE | ORAL | 0 refills | Status: DC

## 2017-08-16 NOTE — Telephone Encounter (Signed)
Will continue 1 month until her visit with me and recheck of vitamin d level

## 2017-08-16 NOTE — Telephone Encounter (Signed)
Received refill request from pt's pharmacy for vit d...will send request to pcp to see if additional refill appropriate, please advise.Despina Hidden Cassady12/20/201811:48 AM

## 2017-08-17 ENCOUNTER — Ambulatory Visit
Admission: RE | Admit: 2017-08-17 | Discharge: 2017-08-17 | Disposition: A | Discharge status: Home / Self Care | Payer: Medicare Other | Source: Ambulatory Visit | Attending: Internal Medicine | Admitting: Internal Medicine

## 2017-08-17 ENCOUNTER — Other Ambulatory Visit: Payer: Self-pay | Admitting: Internal Medicine

## 2017-08-17 DIAGNOSIS — Z1231 Encounter for screening mammogram for malignant neoplasm of breast: Secondary | ICD-10-CM | POA: Diagnosis not present

## 2017-08-17 DIAGNOSIS — E2839 Other primary ovarian failure: Secondary | ICD-10-CM

## 2017-08-17 DIAGNOSIS — Z780 Asymptomatic menopausal state: Secondary | ICD-10-CM | POA: Diagnosis not present

## 2017-08-17 DIAGNOSIS — Z8781 Personal history of (healed) traumatic fracture: Secondary | ICD-10-CM

## 2017-08-17 NOTE — Assessment & Plan Note (Addendum)
Initially I started Alisha [name] on fosamax for her foot fracture due to suspicion of osteoporotic fracture, she has now completed her DEXA which shows very strong BMD.  Given her fracture was not a veterbral or hip fracture, normal BMD, low FRAX for hip (0%) and major osteoporotic (4.3%) I have called her and discontinued Fosamax.  She may continue Vitamin D supplementation.

## 2017-08-17 NOTE — Progress Notes (Signed)
Noted DEXA results, calculated FRAX. 4.3 major osteoportic and 0 hip 10 year calc.  Thus d/c fosamax and informed patient.

## 2017-08-20 ENCOUNTER — Other Ambulatory Visit: Payer: Self-pay | Admitting: *Deleted

## 2017-08-20 MED ORDER — TICAGRELOR 90 MG PO TABS
90.0000 mg | ORAL_TABLET | Freq: Two times a day (BID) | ORAL | 2 refills | Status: DC
Start: 2017-08-20 — End: 2018-10-11

## 2017-08-23 ENCOUNTER — Ambulatory Visit: Payer: Medicare Other | Admitting: Neurology

## 2017-08-25 DIAGNOSIS — J45909 Unspecified asthma, uncomplicated: Secondary | ICD-10-CM | POA: Diagnosis not present

## 2017-08-25 DIAGNOSIS — J42 Unspecified chronic bronchitis: Secondary | ICD-10-CM | POA: Diagnosis not present

## 2017-09-06 ENCOUNTER — Other Ambulatory Visit: Payer: Self-pay

## 2017-09-06 ENCOUNTER — Encounter: Payer: Self-pay | Admitting: Internal Medicine

## 2017-09-06 ENCOUNTER — Encounter: Payer: Self-pay | Admitting: *Deleted

## 2017-09-06 ENCOUNTER — Ambulatory Visit (INDEPENDENT_AMBULATORY_CARE_PROVIDER_SITE_OTHER): Payer: Medicare Other | Admitting: Internal Medicine

## 2017-09-06 VITALS — BP 175/87 | HR 60 | Temp 97.8°F | Ht 66.0 in | Wt 298.7 lb

## 2017-09-06 DIAGNOSIS — M25562 Pain in left knee: Secondary | ICD-10-CM | POA: Diagnosis not present

## 2017-09-06 DIAGNOSIS — M25511 Pain in right shoulder: Secondary | ICD-10-CM

## 2017-09-06 DIAGNOSIS — G8911 Acute pain due to trauma: Secondary | ICD-10-CM | POA: Diagnosis not present

## 2017-09-06 DIAGNOSIS — L4050 Arthropathic psoriasis, unspecified: Secondary | ICD-10-CM | POA: Diagnosis not present

## 2017-09-06 DIAGNOSIS — I10 Essential (primary) hypertension: Secondary | ICD-10-CM | POA: Diagnosis not present

## 2017-09-06 DIAGNOSIS — M7711 Lateral epicondylitis, right elbow: Secondary | ICD-10-CM

## 2017-09-06 DIAGNOSIS — E559 Vitamin D deficiency, unspecified: Secondary | ICD-10-CM

## 2017-09-06 DIAGNOSIS — R7301 Impaired fasting glucose: Secondary | ICD-10-CM

## 2017-09-06 DIAGNOSIS — J45909 Unspecified asthma, uncomplicated: Secondary | ICD-10-CM | POA: Diagnosis not present

## 2017-09-06 DIAGNOSIS — G894 Chronic pain syndrome: Secondary | ICD-10-CM | POA: Diagnosis not present

## 2017-09-06 DIAGNOSIS — Z79899 Other long term (current) drug therapy: Secondary | ICD-10-CM | POA: Diagnosis not present

## 2017-09-06 DIAGNOSIS — Z79891 Long term (current) use of opiate analgesic: Secondary | ICD-10-CM | POA: Diagnosis not present

## 2017-09-06 DIAGNOSIS — J42 Unspecified chronic bronchitis: Secondary | ICD-10-CM | POA: Diagnosis not present

## 2017-09-06 MED ORDER — PROAIR HFA 108 (90 BASE) MCG/ACT IN AERS: 1.0000 | INHALATION_SPRAY | Freq: Four times a day (QID) | RESPIRATORY_TRACT | 5 refills | Status: DC | PRN

## 2017-09-06 MED ORDER — LORATADINE 10 MG PO TABS: 10.0000 mg | ORAL_TABLET | Freq: Every day | ORAL | 3 refills | Status: DC

## 2017-09-06 NOTE — Progress Notes (Signed)
Subjective:  HPI: [name] is a 67 y.o. female who presents for HTN follow up.  She also has numerous acute complaints.  She notes that she had a fall out in [place] visit family while she has at [employer], missed step, fell on her left knee as well as hit her head (not very clear where) she declined ambulance of medical evaluation.  Has since been getting slowly better, currently reporting left knee pain, right shoulder pain and right elbow pain.  Head pain is resolved.  No sensory deficit, no weakness.  She reports she was told by previous PCP her right rotator cuff was "going bad."  Please see Assessment and Plan below for the status of her chronic medical problems.  Review of Systems: Review of Systems  Constitutional: Negative for fever and malaise/fatigue.  Respiratory: Negative for cough and shortness of breath.   Cardiovascular: Negative for chest pain.  Gastrointestinal: Negative for abdominal pain, constipation and diarrhea.  Genitourinary: Negative for dysuria.  Musculoskeletal: Positive for falls and joint pain. Negative for back pain and myalgias.  Neurological: Negative for dizziness and weakness.    Objective:  Physical Exam: Vitals:   09/06/17 0826  BP: (!) 175/87  Pulse: 60  Temp: 97.8 F (36.6 C)  TempSrc: Oral  SpO2: 99%  Weight: 298 lb 11.2 oz (135.5 kg)  Height: 5\' 6"  (1.676 m)   Physical Exam  Constitutional: She is well-developed, well-nourished, and in no distress. No distress.  Sitting in powered scooter  HENT:  Head: Normocephalic and atraumatic.  Eyes: Conjunctivae are normal.  Cardiovascular: Normal rate, regular rhythm, normal heart sounds and intact distal pulses.  No murmur heard. Pulmonary/Chest: Effort normal and breath sounds normal. No respiratory distress. She has no wheezes. She has no rales.  Abdominal: Soft. Bowel sounds are normal. She exhibits no distension. There is no tenderness.  Musculoskeletal: She exhibits  edema (1+ bilateral LE).       Right shoulder: She exhibits decreased range of motion. She exhibits no tenderness, no swelling and no effusion.       Left shoulder: She exhibits decreased range of motion. She exhibits no tenderness, no swelling and no effusion.  Point tenderness over lateral epicondyle, pain with estension of wrist. Decreased abduction of shoulder to about 130 degress, positive neer impingement sign. No warmth or effusion noted of right shoulder.  Skin: Skin is warm and dry. She is not diaphoretic.  Psychiatric: Affect and judgment normal.  Nursing note and vitals reviewed.  Assessment & Plan:  Essential hypertension HPI: No acute issues, reports taking all BP meds. BP elevated today.  A: Essential HTN, not at goal  P: - Again had planned for repeat BP at end of visit but Alisha [name] left without this happening-becoming a problem - For now will plan to continue bystolic, amlodipine, losartan, imdur, and lasix. Likely will need to consider addition of spironolactone. Will first check BMP.  Psoriatic arthritis ([facility]) HPI: as noted above has some left knee, right shoulder and right elbow pain after fall no evidence of synovitis on exam.  Has two small rashes on ankles.    A: Psoriatic arthritis  P: -Has seen dr Trudie Reed plans no biologics or DMARDs at this time, I think there is other cause for her knee shoulder and elbow pain.  Vitamin D deficiency Completed vitamin [place] 56812 units weekly, Recheck vitamin d and start dialy oral supplentation, 2000 IU of vitamin D3 daily.  Acute pain of right shoulder Suspect mild  impingement syndrome versus partial rotator cuff tear.  Will refer to sports medicine for evaluation.  Lateral epicondylitis of right elbow Suspect lateral epicondylitis, discussed possible use of brace, RICE therapy.  Will also have her seen by Sports medicine.  Chronic pain syndrome Provided 2 refills in November for her morhpine and  oxycodone, she reports she has had limited use of oxycodone.  I reivewed the PMP database, no fills since October, she reports to me that she did indeed fill 1 rx but has not filled the second, that fill was in [place] at her regular pharmacy.  At this time I am not sure why it is not showing on the database.  However she has the second script of oxycodone and morphine with her that I reviewed.  I instructed she could fill this, I asked her to call my office and request a refill when needed.  I suspect at the last this is lasting longer than she needs, will need to adjust down her medications to more accurate reflect her months needs.  Due to her polypharmacy I think it would be excellent to wean the opioids.  I spent 40 minutes face to face with the patient. Greater than 50% of the time was spent on counseling and coordination of care, which is detailed in the A&P. Unfortunately at the end of this visit I had requested she stop by the lab, unfortunately she declined as she need to catch her transportation, she will need to come back for labs. I also spoke with our lab techs, patient spent several minutes on the phone with [facility] when labs could have been obtained then stated she had to go.  Medications Ordered Meds ordered this encounter  Medications  . PROAIR HFA 108 (90 Base) MCG/ACT inhaler    Sig: Inhale 1-2 puffs into the lungs every 6 (six) hours as needed for wheezing or shortness of breath.    Dispense:  18 g    Refill:  5  . DISCONTD: loratadine (CLARITIN) 10 MG tablet    Sig: Take 1 tablet (10 mg total) by mouth daily.    Dispense:  90 tablet    Refill:  3  . loratadine (CLARITIN) 10 MG tablet    Sig: Take 1 tablet (10 mg total) by mouth daily.    Dispense:  90 tablet    Refill:  3   Other Orders Orders Placed This Encounter  Procedures  . Vitamin D (25 hydroxy)  . BMP8+Anion Gap  . CBC with Diff  . Ambulatory referral to Sports Medicine    Referral Priority:   Routine    Referral  Type:   Consultation    Number of Visits Requested:   1  . POC Hbg A1C   Follow Up: Return in about 3 months (around 12/05/2017).

## 2017-09-06 NOTE — Patient Instructions (Signed)
I will call with the results of your labwork.  I am referring you over to sports medicine to see you for your right shoulder and elbow pain

## 2017-09-07 DIAGNOSIS — M25511 Pain in right shoulder: Secondary | ICD-10-CM | POA: Insufficient documentation

## 2017-09-07 DIAGNOSIS — M7711 Lateral epicondylitis, right elbow: Secondary | ICD-10-CM | POA: Insufficient documentation

## 2017-09-07 NOTE — Assessment & Plan Note (Signed)
Completed vitamin d suppl[place]ation of 05259 units weekly, Recheck vitamin d and start dialy oral supplentation, 2000 IU of vitamin D3 daily.

## 2017-09-07 NOTE — Assessment & Plan Note (Signed)
Provided 2 refills in November for her morhpine and oxycodone, she reports she has had limited use of oxycodone.  I reivewed the PMP database, no fills since October, she reports to me that she did indeed fill 1 rx but has not filled the second, that fill was in [place] at her regular pharmacy.  At this time I am not sure why it is not showing on the database.  However she has the second script of oxycodone and morphine with her that I reviewed.  I instructed she could fill this, I asked her to call my office and request a refill when needed.  I suspect at the last this is lasting longer than she needs, will need to adjust down her medications to more accurate reflect her months needs.  Due to her polypharmacy I think it would be excellent to wean the opioids.

## 2017-09-07 NOTE — Assessment & Plan Note (Signed)
HPI: as noted above has some left knee, right shoulder and right elbow pain after fall no evidence of synovitis on exam.  Has two small rashes on ankles.    A: Psoriatic arthritis  P: -Has seen dr Trudie Reed plans no biologics or DMARDs at this time, I think there is other cause for her knee shoulder and elbow pain.

## 2017-09-07 NOTE — Assessment & Plan Note (Signed)
Suspect lateral epicondylitis, discussed possible use of brace, RICE therapy.  Will also have her seen by Sports medicine.

## 2017-09-07 NOTE — Assessment & Plan Note (Signed)
Suspect mild impingement syndrome versus partial rotator cuff tear.  Will refer to sports medicine for evaluation.

## 2017-09-07 NOTE — Assessment & Plan Note (Addendum)
HPI: No acute issues, reports taking all BP meds. BP elevated today.  A: Essential HTN, not at goal  P: - Again had planned for repeat BP at end of visit but Pa left without this happening-becoming a problem - For now will plan to continue bystolic, amlodipine, losartan, imdur, and lasix. Likely will need to consider addition of spironolactone. Will first check BMP.

## 2017-09-12 ENCOUNTER — Ambulatory Visit (INDEPENDENT_AMBULATORY_CARE_PROVIDER_SITE_OTHER): Payer: Medicare Other | Admitting: Family Medicine

## 2017-09-12 ENCOUNTER — Encounter: Payer: Self-pay | Admitting: Family Medicine

## 2017-09-12 DIAGNOSIS — M25511 Pain in right shoulder: Secondary | ICD-10-CM | POA: Diagnosis not present

## 2017-09-12 DIAGNOSIS — M7701 Medial epicondylitis, right elbow: Secondary | ICD-10-CM

## 2017-09-12 DIAGNOSIS — G8929 Other chronic pain: Secondary | ICD-10-CM | POA: Diagnosis not present

## 2017-09-12 MED ORDER — NITROGLYCERIN 0.2 MG/HR TD PT24: MEDICATED_PATCH | TRANSDERMAL | 1 refills | Status: DC

## 2017-09-12 MED ORDER — METHYLPREDNISOLONE ACETATE 40 MG/ML IJ SUSP
40.0000 mg | Freq: Once | INTRAMUSCULAR | Status: AC
  Administered 2017-09-12: 40 mg via INTRA_ARTICULAR

## 2017-09-12 NOTE — Patient Instructions (Signed)
You have rotator cuff impingement of your right shoulder Try to avoid painful activities (overhead activities, lifting with extended arm) as much as possible. Aleve 2 tabs twice a day with food OR ibuprofen 3 tabs three times a day with food for pain and inflammation. Ok to take your oxycodone and tizanidine in addition to this. Nitro 1/4th patch to affected shoulder, change daily - if you tolerate this you can start using 1/4th patch on your elbow also. Subacromial injection may be beneficial to help with pain and to decrease inflammation - you were given this today. Add physical therapy with transition to home exercise program for this and your elbow. Do home exercises on days you don't go to therapy. If not improving at follow-up we will consider further imaging. Follow up with me in 6 weeks.  You have medial epicondylitis (golfer's elbow) Avoid painful activities as much as possible (unless doing home exercises). Icing 3-4 times a day - careful around ulnar nerve on inside of elbow. Strengthening with 1 pound weight pronation/supination, wrist flexion, stretching exercises (hold stretches 20-30 seconds and repeat 3 times, exercises 3 sets of 10 of each). Sleeve or counterforce brace may be helpful to unload area of pain while it heals. Follow up with me in 6 weeks.

## 2017-09-16 DIAGNOSIS — M7701 Medial epicondylitis, right elbow: Secondary | ICD-10-CM | POA: Insufficient documentation

## 2017-09-16 NOTE — Progress Notes (Signed)
PCP: Lucious Groves, DO  Subjective:   HPI: Patient is a 67 y.o. female here for right shoulder, arm pain.  Patient reports she's had right shoulder and elbow pain dating back several months. Believes this may have started in June to July 2018 when she had several falls. Pain is sharp lateral right shoulder and medial right elbow. Associated spasms of these areas. Difficulty getting comfortable. + night pain. Worse with reaching. Prior issues with right rotator cuff and told this was 'going bad' No skin changes, numbness.  Past Medical History:  Diagnosis Date  . Arthritis   . Asthma   . Bipolar disorder ([facility])   . CAD (coronary artery disease) 07/07/2016   a. s/p stenting x 2 in [place] to the LAD, RCA is chronically occluded, LVEDP was 34  . Cancer ([facility])    cervical  . Colitis   . Depression   . Fibromyalgia   . GERD (gastroesophageal reflux disease)   . Heart murmur    during pregnancy 1967  . History of benign pituitary tumor   . History of kidney stones    1972  . Hyperlipidemia   . Hypertension   . Migraine   . Morbid obesity ([facility])   . Peripheral vascular disease ([facility])   . Pneumonia   . Seizures ([facility])    during surgery for pituitary tumor  . Sleep apnea    with cpap  . Vertigo     Current Outpatient Medications on File Prior to Visit  Medication Sig Dispense Refill  . albuterol (PROVENTIL) (2.5 MG/3ML) 0.083% nebulizer solution Take 3 mLs (2.5 mg total) by nebulization every 6 (six) hours as needed for wheezing or shortness of breath. 150 mL 1  . amLODipine (NORVASC) 10 MG tablet Take 1 tablet (10 mg total) daily by mouth. 90 tablet 2  . aspirin 81 MG EC tablet TAKE 1 TABLET BY MOUTH EVERY DAY 90 tablet 1  . baclofen (LIORESAL) 10 MG tablet Take 1 tablet (10 mg total) by mouth at bedtime. 30 each 2  . DULoxetine (CYMBALTA) 60 MG capsule Take 1 capsule (60 mg total) by mouth daily. 90 capsule 3  . esomeprazole (NEXIUM) 40 MG capsule Take 40 mg by mouth 2 (two)  times daily before a meal.    . Fluticasone-Salmeterol (ADVAIR DISKUS) 250-50 MCG/DOSE AEPB Inhale 1 puff into the lungs 2 (two) times daily. 60 each 11  . furosemide (LASIX) 20 MG tablet TAKE 2 TABLETS (40 MG TOTAL) BY MOUTH DAILY. 60 tablet 6  . isosorbide dinitrate (ISORDIL) 30 MG tablet Take 30 mg by mouth daily.    Marland Kitchen loratadine (CLARITIN) 10 MG tablet Take 1 tablet (10 mg total) by mouth daily. 90 tablet 3  . losartan (COZAAR) 100 MG tablet Take 1 tablet (100 mg total) daily by mouth. 90 tablet 0  . mirabegron ER (MYRBETRIQ) 25 MG TB24 tablet Take 1 tablet (25 mg total) by mouth daily. 90 tablet 0  . morphine (MS CONTIN) 15 MG 12 hr tablet Take 1 tablet (15 mg total) every 12 (twelve) hours by mouth. 60 tablet 0  . nebivolol (BYSTOLIC) 5 MG tablet Take 1 tablet (5 mg total) by mouth daily. 30 tablet 5  . oxyCODONE-acetaminophen (ROXICET) 5-325 MG tablet Take 1 tablet every 8 (eight) hours as needed by mouth for severe pain (breakthrough). 30 tablet 0  . PRESCRIPTION MEDICATION Place 1 application into the left eye at bedtime as needed (rash). Opthalmic ointment    . PROAIR HFA  108 (90 Base) MCG/ACT inhaler Inhale 1-2 puffs into the lungs every 6 (six) hours as needed for wheezing or shortness of breath. 18 g 5  . rizatriptan (MAXALT) 10 MG tablet Take 1 tablet for headache.  May repeat once in 2 hours if needed.  Do not exceed 2 tablets in 24 hours. 10 tablet 3  . ticagrelor (BRILINTA) 90 MG TABS tablet Take 1 tablet (90 mg total) by mouth 2 (two) times daily. 180 tablet 2  . tiZANidine (ZANAFLEX) 4 MG tablet Take 1 tablet (4 mg total) by mouth at bedtime. 30 tablet 3  . atorvastatin (LIPITOR) 20 MG tablet TAKE 1 TABLET (20 MG TOTAL) BY MOUTH DAILY. 90 tablet 1  . nitroGLYCERIN (NITROSTAT) 0.4 MG SL tablet Place 1 tablet (0.4 mg total) under the tongue every 5 (five) minutes as needed for chest pain. 25 tablet 3   No current facility-administered medications on file prior to visit.     Past  Surgical History:  Procedure Laterality Date  . ANKLE FUSION Bilateral   . carpel tunnel release Bilateral 2017  . CORONARY ANGIOPLASTY    . ESOPHAGOGASTRODUODENOSCOPY (EGD) WITH PROPOFOL N/A 06/14/2017   Procedure: ESOPHAGOGASTRODUODENOSCOPY (EGD) WITH PROPOFOL;  Surgeon: Wilford Corner, MD;  Location: [facility];  Service: Endoscopy;  Laterality: N/A;  . EXCISION / CURETTAGE BONE CYST PHALANGES OF FOOT  2014   Removal of foot cyst   . PARTIAL HYSTERECTOMY  unknown   Patient still has ovaries  . pituitary tumor removal  1999   removed as much as they could, on optic nerve  . TONSILLECTOMY    . TOTAL KNEE ARTHROPLASTY     TKR X 3  2 on the left and 1 on the right    Allergies  Allergen Reactions  . Ibuprofen     Other reaction(s): Upset Stomach  . Ampicillin Nausea Only  . Asa [Aspirin] Diarrhea    Other reaction(s): Upset Stomach  . Darvon [Propoxyphene] Nausea And Vomiting  . Demeclocycline Other (See Comments)    Nerves feeling  . Eggs Or Egg-Derived Products Diarrhea  . Lactalbumin Diarrhea  . Lisinopril Cough  . Milk-Related Compounds Diarrhea  . Salicylates Other (See Comments)    Unknown Unknown   . Tetracycline Hcl Hives and Nausea Only  . Tetracyclines & Related Other (See Comments)    Nerves feeling     Social History   Socioeconomic History  . Marital status: Divorced    Spouse name: Not on file  . Number of children: Not on file  . Years of education: Not on file  . Highest education level: Not on file  Social Needs  . Financial resource strain: Not on file  . Food insecurity - worry: Not on file  . Food insecurity - inability: Not on file  . Transportation needs - medical: Not on file  . Transportation needs - non-medical: Not on file  Occupational History  . Not on file  Tobacco Use  . Smoking status: Never Smoker  . Smokeless tobacco: Never Used  Substance and Sexual Activity  . Alcohol use: Yes    Comment: Socially. [name].   . Drug use: No  . Sexual activity: No  Other Topics Concern  . Not on file  Social History Narrative   Current Social History        Who lives at home: Lives with youngest daughter, "Olivia Mackie" 09/25/2016    Transportation: Public 5/00/9381   Important Relationships & Pets: 2 daughters (40, 23),  1 son (28), 10 grandchildren, 4 great grandchildren 09/25/2016    Current Stressors: Recently moved from [place], [place] due to threat of NH placement 09/25/2016   Religious / Personal Beliefs: Christian 09/25/2016   Interests / Fun: Dancing 09/25/2016   Other: Participates in [employer] 09/25/2016    Family History  Problem Relation Age of Onset  . Heart disease Mother 62       CABG, pacemaker, valve  . Cancer Father   . Heart disease Sister        Fluid around the heart  . Breast cancer Neg Hx     BP (!) 154/69   Ht 5\' 6"  (1.676 m)   Wt 293 lb (132.9 kg)   BMI 47.29 kg/m   Review of Systems: See HPI above.     Objective:  Physical Exam:  Gen: NAD, comfortable in exam room  Right shoulder: No swelling, ecchymoses.  No gross deformity. Diffuse tenderness. ER 45 degrees, equal to left shoulder.  Abduction and flexion to 130 degrees, painful. Positive Hawkins, Neers. Negative Yergasons. Strength 5/5 with empty can and resisted internal/external rotation. Negative apprehension. NV intact distally.  Left shoulder: No swelling, ecchymoses.  No gross deformity. No TTP. ER 45 degrees, full abduction and flexion. Strength 5/5 with empty can and resisted internal/external rotation. NV intact distally.  Right elbow: No deformity, swelling, bruising. TTP medial epicondyle and just distal to this.  No other tenderness. FROM elbow with 5/5 strength.  Full strength also of wrist without reproduction of pain at elbow with wrist flexion and 3rd digit flexion or pronation. Collateral ligaments intact. NVI distally.   Assessment & Plan:  1. Right shoulder pain - 2/2 rotator cuff  impingement.  Injection given.  Aleve or ibuprofen.  Oxycodone, tizanidine.  Nitro patches daily.  Add physical therapy for this.  F/u in 6 weeks.  After informed written consent timeout was performed, patient was seated on exam table. Right shoulder was prepped with alcohol swab and utilizing posterior approach, patient's right subacromial space was injected with 3:1 bupivicaine: depomedrol. Patient tolerated the procedure well without immediate complications.  2. Right medial epicondylitis - shown home exercises to do for this.  Icing, add physical therapy.  Sleeve or counterforce brace.  F/u in 6 weeks.

## 2017-09-16 NOTE — Assessment & Plan Note (Signed)
2/2 rotator cuff impingement.  Injection given.  Aleve or ibuprofen.  Oxycodone, tizanidine.  Nitro patches daily.  Add physical therapy for this.  F/u in 6 weeks.  After informed written consent timeout was performed, patient was seated on exam table. Right shoulder was prepped with alcohol swab and utilizing posterior approach, patient's right subacromial space was injected with 3:1 bupivicaine: depomedrol. Patient tolerated the procedure well without immediate complications.

## 2017-09-16 NOTE — Assessment & Plan Note (Signed)
shown home exercises to do for this.  Icing, add physical therapy.  Sleeve or counterforce brace.  F/u in 6 weeks.

## 2017-09-21 ENCOUNTER — Ambulatory Visit (INDEPENDENT_AMBULATORY_CARE_PROVIDER_SITE_OTHER): Payer: Medicare Other

## 2017-09-21 ENCOUNTER — Ambulatory Visit (INDEPENDENT_AMBULATORY_CARE_PROVIDER_SITE_OTHER): Payer: Medicare Other | Admitting: Podiatry

## 2017-09-21 ENCOUNTER — Encounter: Payer: Self-pay | Admitting: Podiatry

## 2017-09-21 DIAGNOSIS — M79675 Pain in left toe(s): Secondary | ICD-10-CM | POA: Diagnosis not present

## 2017-09-21 DIAGNOSIS — B351 Tinea unguium: Secondary | ICD-10-CM | POA: Diagnosis not present

## 2017-09-21 DIAGNOSIS — M19071 Primary osteoarthritis, right ankle and foot: Secondary | ICD-10-CM | POA: Diagnosis not present

## 2017-09-21 DIAGNOSIS — R609 Edema, unspecified: Secondary | ICD-10-CM

## 2017-09-21 DIAGNOSIS — R2681 Unsteadiness on feet: Secondary | ICD-10-CM | POA: Diagnosis not present

## 2017-09-21 DIAGNOSIS — M19079 Primary osteoarthritis, unspecified ankle and foot: Secondary | ICD-10-CM

## 2017-09-21 DIAGNOSIS — M79674 Pain in right toe(s): Secondary | ICD-10-CM

## 2017-09-21 DIAGNOSIS — M79673 Pain in unspecified foot: Secondary | ICD-10-CM | POA: Diagnosis not present

## 2017-09-21 DIAGNOSIS — M19072 Primary osteoarthritis, left ankle and foot: Secondary | ICD-10-CM | POA: Diagnosis not present

## 2017-09-21 DIAGNOSIS — G8929 Other chronic pain: Secondary | ICD-10-CM | POA: Diagnosis not present

## 2017-09-23 NOTE — Progress Notes (Signed)
Subjective: Alisha [name] presents the office today for follow-up evaluation of bilateral foot and ankle pain on the left side worse than the right.  She was doing physical therapy and this was helping but due to insurance issues she had a stop.  She wears a brace on the left side which does help.  She denies any recent injury to her feet however since her last appointment me she has had 2 falls one time injuring her head that required sutures.  She also states that her nails are thick, discolored and she cannot trim herself and they are causing pain within shoes and pressure.  No redness or drainage from the toenail sites. Denies any systemic complaints such as fevers, chills, nausea, vomiting. No acute changes since last appointment, and no other complaints at this time.   Objective: AAO x3, NAD DP/PT pulses palpable bilaterally, CRT less than 3 seconds There is tenderness along sinus tarsi the lateral aspect of bilateral feet.  There is mild tenderness to the anterior ankle joint line on the left side worse than the right.  There is diffuse tenderness to the left foot and ankle but there is no specific area pinpoint bony tenderness or pain to vibratory sensation.  Achilles tendon intact.  Mild chronic edema but there is no erythema or increase in warmth bilaterally. Nails are hypertrophic, dystrophic, discolored with yellow-brown discoloration.  No surrounding redness or drainage.  Tenderness nails 1-5 bilaterally. No open lesions or pre-ulcerative lesions.  No pain with calf compression, swelling, warmth, erythema  Assessment: Bilateral chronic foot pain, left ankle arthritis; symptomatic onychomycosis  Plan: -All treatment options discussed with the patient including all alternatives, risks, complications.  -X-rays were obtained and reviewed.  Hardware intact from prior surgery.  Arthritic changes present of the ankle as well as midfoot.  No other definitive evidence of acute fracture.  Old  fracture of the left fifth metatarsal base. -At this time I do recommend he continue physical therapy and restart this as it was helping.  I would continue with the brace.  I do think that her symptoms are mostly chronic in nature.  She has not taken any medication at this point.  If she continues to get symptoms consider surgical intervention. -Nails sharp debrided x10 without any complications or bleeding -Follow-up as scheduled or sooner if needed.  She agrees with this plan has no further questions or concerns -Patient encouraged to call the office with any questions, concerns, change in symptoms.   Alisha [name] DPM

## 2017-09-24 ENCOUNTER — Other Ambulatory Visit: Payer: Self-pay | Admitting: Podiatry

## 2017-09-24 ENCOUNTER — Ambulatory Visit: Payer: Medicare Other | Admitting: Podiatry

## 2017-09-24 DIAGNOSIS — M19079 Primary osteoarthritis, unspecified ankle and foot: Secondary | ICD-10-CM

## 2017-09-25 DIAGNOSIS — J45909 Unspecified asthma, uncomplicated: Secondary | ICD-10-CM | POA: Diagnosis not present

## 2017-09-25 DIAGNOSIS — J42 Unspecified chronic bronchitis: Secondary | ICD-10-CM | POA: Diagnosis not present
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
## 2017-09-28 ENCOUNTER — Encounter: Payer: Self-pay | Admitting: Cardiology

## 2017-09-28 ENCOUNTER — Ambulatory Visit (INDEPENDENT_AMBULATORY_CARE_PROVIDER_SITE_OTHER): Payer: Medicare Other | Admitting: Cardiology

## 2017-09-28 ENCOUNTER — Encounter (INDEPENDENT_AMBULATORY_CARE_PROVIDER_SITE_OTHER): Payer: Self-pay

## 2017-09-28 VITALS — BP 134/76 | HR 62 | Ht 66.0 in | Wt 290.2 lb

## 2017-09-28 DIAGNOSIS — G4733 Obstructive sleep apnea (adult) (pediatric): Secondary | ICD-10-CM | POA: Diagnosis not present

## 2017-09-28 DIAGNOSIS — I10 Essential (primary) hypertension: Secondary | ICD-10-CM | POA: Diagnosis not present

## 2017-09-28 MED ORDER — ASPIRIN EC 81 MG PO TBEC: 81.0000 mg | DELAYED_RELEASE_TABLET | Freq: Every day | ORAL | 3 refills | Status: DC

## 2017-09-28 NOTE — Patient Instructions (Signed)
Medication Instructions:  Your physician has recommended you make the following change in your medication:   DECREASE: aspirin to 81 mg once a day   Labwork: None Ordered   Testing/Procedures: None Ordered   Follow-Up: Your physician wants you to follow-up in: 11 weeks with Dr. Radford Pax. You will receive a reminder letter in the mail two months in advance. If you don't receive a letter, please call our office to schedule the follow-up appointment.  Any Other Special Instructions Will Be Listed Below (If Applicable).  CPAP orders have been placed. You will receive a call from the home health agency regarding setting up equipment. If you do not receive a call within the next week give Gae Bon, CPAP assistant a call at [phone].     If you need a refill on your cardiac medications before your next appointment, please call your pharmacy.

## 2017-10-01 ENCOUNTER — Telehealth: Payer: Self-pay | Admitting: *Deleted

## 2017-10-01 NOTE — Telephone Encounter (Signed)
-----   Message from [place], [place] sent at 09/28/2017  8:37 AM EST ----- Regarding: dme order DME order placed for Airsense CPAP with 2 week autotitration from 4-18 cm H2O with heated humidity and Resmed airfit P20 mask  With chin strap.   Thanks  [employer]

## 2017-10-01 NOTE — Telephone Encounter (Addendum)
Order sent to the local office via community message called [facility] on [address] in  [place], [place].

## 2017-10-06 ENCOUNTER — Encounter: Payer: Self-pay | Admitting: Cardiology

## 2017-10-06 NOTE — Progress Notes (Signed)
Cardiology Office Note   Date:  10/08/2017   ID:  Mercadez Heitman, DOB 1951/02/06, MRN [id number]  PCP:  Lucious Groves, DO  Cardiologist:   Minus Breeding, MD  Referring:  Lucious Groves, DO  Chief Complaint  Patient presents with  . Shortness of Breath      History of Present Illness: Alisha [name] is a 67 y.o. female who presents for evaluation of coronary disease. She is just moved here from [place]. She reports that in late October 2017 she presented with chest pain and received 2 stents to the LAD with an occluded RCA.  Since I last saw her she had SOB thought to be related possibly to bronchospasm and she was switched from Bystolic to metoprolol.  She has also been seen by Dr. Radford Pax for treatment of sleep apnea.  Since I last saw her she is done well from a cardiovascular standpoint.  She gets around slowly with a cane and motorized scooter because of back and joint problems and obesity.  Since I last saw her she is had no further chest pain.  She did not needed any nitroglycerin.  She denies any palpitations, presyncope or syncope.  She said no PND or orthopnea.  She does get chronic shortness of breath and some expiratory wheezing.  She has a sensation when she has to clear her throat routinely.  If she does have some  chronic leg swelling which would be worse if she does not take her Lasix.   Past Medical History:  Diagnosis Date  . Arthritis   . Asthma   . Bipolar disorder ([place])   . CAD (coronary artery disease) 07/07/2016   a. s/p stenting x 2 in [place] to the LAD, RCA is chronically occluded, LVEDP was 34  . Cancer (HCC)    cervical  . Depression   . Fibromyalgia   . GERD (gastroesophageal reflux disease)   . History of benign pituitary tumor   . History of kidney stones    1972  . Hyperlipidemia   . Hypertension   . Migraine   . Morbid obesity ([place])   . Peripheral vascular disease ([place])   . Pneumonia   . Seizures ([place])    during surgery for pituitary  tumor  . Sleep apnea    with cpap  . Vertigo     Past Surgical History:  Procedure Laterality Date  . ANKLE FUSION Bilateral   . carpel tunnel release Bilateral 2017  . CORONARY ANGIOPLASTY    . ESOPHAGOGASTRODUODENOSCOPY (EGD) WITH PROPOFOL N/A 06/14/2017   Procedure: ESOPHAGOGASTRODUODENOSCOPY (EGD) WITH PROPOFOL;  Surgeon: Wilford Corner, MD;  Location: [facility];  Service: Endoscopy;  Laterality: N/A;  . EXCISION / CURETTAGE BONE CYST PHALANGES OF FOOT  2014   Removal of foot cyst   . PARTIAL HYSTERECTOMY  unknown   Patient still has ovaries  . pituitary tumor removal  1999   removed as much as they could, on optic nerve  . TONSILLECTOMY    . TOTAL KNEE ARTHROPLASTY     TKR X 3  2 on the left and 1 on the right     Current Outpatient Medications  Medication Sig Dispense Refill  . albuterol (PROVENTIL) (2.5 MG/3ML) 0.083% nebulizer solution Take 3 mLs (2.5 mg total) by nebulization every 6 (six) hours as needed for wheezing or shortness of breath. 150 mL 1  . amLODipine (NORVASC) 10 MG tablet Take 1 tablet (10 mg total) daily by mouth. 90 tablet 2  .  aspirin EC 81 MG tablet Take 1 tablet (81 mg total) by mouth daily. 90 tablet 3  . baclofen (LIORESAL) 10 MG tablet Take 1 tablet (10 mg total) by mouth at bedtime. 30 each 2  . DULoxetine (CYMBALTA) 60 MG capsule Take 1 capsule (60 mg total) by mouth daily. 90 capsule 3  . esomeprazole (NEXIUM) 40 MG capsule Take 40 mg by mouth 2 (two) times daily before a meal.    . Fluticasone-Salmeterol (ADVAIR DISKUS) 250-50 MCG/DOSE AEPB Inhale 1 puff into the lungs 2 (two) times daily. 60 each 11  . furosemide (LASIX) 20 MG tablet Take 2 tablets (40 mg total) by mouth daily. 180 tablet 3  . isosorbide dinitrate (ISORDIL) 30 MG tablet Take 30 mg by mouth daily.    Marland Kitchen loratadine (CLARITIN) 10 MG tablet Take 1 tablet (10 mg total) by mouth daily. 90 tablet 3  . losartan (COZAAR) 100 MG tablet Take 1 tablet (100 mg total) daily by mouth.  90 tablet 0  . mirabegron ER (MYRBETRIQ) 25 MG TB24 tablet Take 1 tablet (25 mg total) by mouth daily. 90 tablet 0  . morphine (MS CONTIN) 15 MG 12 hr tablet Take 1 tablet (15 mg total) every 12 (twelve) hours by mouth. 60 tablet 0  . nebivolol (BYSTOLIC) 5 MG tablet Take 1 tablet (5 mg total) by mouth daily. 90 tablet 3  . nitroGLYCERIN (NITRODUR - DOSED IN MG/24 HR) 0.2 mg/hr patch Apply 1/4th patch to affected shoulder, change daily 30 patch 1  . oxyCODONE-acetaminophen (ROXICET) 5-325 MG tablet Take 1 tablet every 8 (eight) hours as needed by mouth for severe pain (breakthrough). 30 tablet 0  . PRESCRIPTION MEDICATION Place 1 application into the left eye at bedtime as needed (rash). Opthalmic ointment    . PROAIR HFA 108 (90 Base) MCG/ACT inhaler Inhale 1-2 puffs into the lungs every 6 (six) hours as needed for wheezing or shortness of breath. 18 g 5  . rizatriptan (MAXALT) 10 MG tablet Take 1 tablet for headache.  May repeat once in 2 hours if needed.  Do not exceed 2 tablets in 24 hours. 10 tablet 3  . ticagrelor (BRILINTA) 90 MG TABS tablet Take 1 tablet (90 mg total) by mouth 2 (two) times daily. 180 tablet 2  . tiZANidine (ZANAFLEX) 4 MG tablet Take 1 tablet (4 mg total) by mouth at bedtime. 30 tablet 3  . atorvastatin (LIPITOR) 20 MG tablet TAKE 1 TABLET (20 MG TOTAL) BY MOUTH DAILY. 90 tablet 1  . nitroGLYCERIN (NITROSTAT) 0.4 MG SL tablet Place 1 tablet (0.4 mg total) under the tongue every 5 (five) minutes as needed for chest pain. 25 tablet 3   No current facility-administered medications for this visit.     Allergies:   Ibuprofen; Ampicillin; Asa [aspirin]; Darvon [propoxyphene]; Demeclocycline; Eggs or egg-derived products; Lactalbumin; Lisinopril; Milk-related compounds; Salicylates; Tetracycline hcl; and Tetracyclines & related     ROS:  Please see the history of present illness.   Otherwise, review of systems are positive for none.   All other systems are reviewed and  negative.    PHYSICAL EXAM: VS:  BP 140/90   Pulse (!) 57   Ht 5\' 6"  (1.676 m)   Wt 292 lb (132.5 kg)   BMI 47.13 kg/m  , BMI Body mass index is 47.13 kg/m.  GENERAL:  Well appearing NECK:  No jugular venous distention, waveform within normal limits, carotid upstroke brisk and symmetric, no bruits, no thyromegaly LUNGS: Scattered upper airway  expiratory wheezes  CHEST:  Unremarkable HEART:  PMI not displaced or sustained,S1 and S2 within normal limits, no S3, no S4, no clicks, no rubs, no murmurs ABD:  Flat, positive bowel sounds normal in frequency in pitch, no bruits, no rebound, no guarding, no midline pulsatile mass, no hepatomegaly, no splenomegaly EXT:  2 plus pulses throughout, mild edema, no cyanosis no clubbing   EKG:  EKG is not ordered today.   Recent Labs: 12/13/2016: ALT 18; BUN 7; Creatinine, Ser 0.78; Hemoglobin 11.9; Platelets 191; Potassium 3.7; Sodium 144    Lipid Panel    Component Value Date/Time   CHOL 172 11/15/2016 1013   TRIG 149 11/15/2016 1013   HDL 37 (L) 11/15/2016 1013   CHOLHDL 4.6 11/15/2016 1013   VLDL 30 11/15/2016 1013   LDLCALC 105 (H) 11/15/2016 1013      Wt Readings from Last 3 Encounters:  10/08/17 292 lb (132.5 kg)  09/28/17 290 lb 3.2 oz (131.6 kg)  09/12/17 293 lb (132.9 kg)      Other studies Reviewed: Additional studies/ records that were reviewed today include: Sleep apnea notes. Review of the above records demonstrates:      ASSESSMENT AND PLAN:  CAD:    The patient has no new sypmtoms.  No further cardiovascular testing is indicated.  We will continue with aggressive risk reduction and meds as listed.  LEG PAIN:  She had normal ABIs.  I suspect this related to orthopedic problems.   HTN:  The blood pressure is at target.  No change in therapy.   HYPERLIPIDEMIA:   I will check a lipid profile.    SOB:  I took the liberty of referring her to pulmonary for her ongoing symptoms and her history of chronic lung  disease.  I notified Lucious Groves, DO  Current medicines are reviewed at length with the patient today.  The patient does not have concerns regarding medicines.  The following changes have been made:  None  Labs/ tests ordered today include:    Orders Placed This Encounter  Procedures  . Lipid panel  . Hepatic function panel  . Ambulatory referral to Pulmonology     Disposition:   FU with me in one year.     Signed, Minus Breeding, MD  10/08/2017 10:09 AM    [facility]

## 2017-10-08 ENCOUNTER — Encounter: Payer: Self-pay | Admitting: Cardiology

## 2017-10-08 ENCOUNTER — Ambulatory Visit (INDEPENDENT_AMBULATORY_CARE_PROVIDER_SITE_OTHER): Payer: Medicare Other | Admitting: Cardiology

## 2017-10-08 VITALS — BP 140/90 | HR 57 | Ht 66.0 in | Wt 292.0 lb

## 2017-10-08 DIAGNOSIS — I2510 Atherosclerotic heart disease of native coronary artery without angina pectoris: Secondary | ICD-10-CM | POA: Diagnosis not present

## 2017-10-08 DIAGNOSIS — Z79899 Other long term (current) drug therapy: Secondary | ICD-10-CM

## 2017-10-08 DIAGNOSIS — R0602 Shortness of breath: Secondary | ICD-10-CM

## 2017-10-08 DIAGNOSIS — I10 Essential (primary) hypertension: Secondary | ICD-10-CM | POA: Diagnosis not present

## 2017-10-08 DIAGNOSIS — J45909 Unspecified asthma, uncomplicated: Secondary | ICD-10-CM | POA: Diagnosis not present

## 2017-10-08 DIAGNOSIS — J42 Unspecified chronic bronchitis: Secondary | ICD-10-CM | POA: Diagnosis not present

## 2017-10-08 DIAGNOSIS — E785 Hyperlipidemia, unspecified: Secondary | ICD-10-CM

## 2017-10-08 DIAGNOSIS — R0609 Other forms of dyspnea: Secondary | ICD-10-CM

## 2017-10-08 MED ORDER — NEBIVOLOL HCL 5 MG PO TABS: 5.0000 mg | ORAL_TABLET | Freq: Every day | ORAL | 3 refills | Status: DC

## 2017-10-08 MED ORDER — FUROSEMIDE 20 MG PO TABS: 40.0000 mg | ORAL_TABLET | Freq: Every day | ORAL | 3 refills | Status: DC

## 2017-10-08 NOTE — Patient Instructions (Signed)
Medication Instructions:  Continue current medications  If you need a refill on your cardiac medications before your next appointment, please call your pharmacy.  Labwork: Fasting Lipid Liver HERE IN OUR OFFICE AT [facility]  Take the provided lab slips for you to take with you to the lab for you blood draw.   You will need to fast. DO NOT EAT OR DRINK PAST MIDNIGHT.    You may go to any [facility] lab that is convenient for you however, we do have a lab in our office that is able to assist you. You do NOT need an appointment for our lab. Once in our office lobby there is a podium to the right of the check-in desk where you are to sign-in and ring a doorbell to alert [place] you are here. Lab is open Monday-Friday from 8:00am to 4:00pm; and is closed for lunch from 12:45p-1:45pm   Testing/Procedures: None Ordered   Follow-Up: Your physician wants you to follow-up in: 1 Year. You should receive a reminder letter in the mail two months in advance. If you do not receive a letter, please call our office [phone].   You have been referred to Pulmonologist    Thank you for choosing [facility] at [facility]!!

## 2017-10-15 ENCOUNTER — Other Ambulatory Visit: Payer: Self-pay | Admitting: Internal Medicine

## 2017-10-16 NOTE — Telephone Encounter (Signed)
I reviewed the note from Dr Matilde Sprang [facility]) from 11/30.  It appears he started Detrol LA for her overactive bladder.  I am not sure from his note if he wanted to continue myrbetriq for overactive bladder. It also appears she was supposed to follow up in 4-6 weeks, I do not have a second note from follow up.  Could you call over to Dr MacDiarmid's nurse and inquire if she has follow up or if she should continue on myrbetriq?

## 2017-10-16 NOTE — Telephone Encounter (Signed)
Thank you daisy

## 2017-10-16 NOTE — Telephone Encounter (Signed)
Per [facility] nurse, myrbetriq was discontinued & patient was started on Detrol LA (they ran out of myrbetriq samples). Therefore this request will be refused.

## 2017-10-18 ENCOUNTER — Other Ambulatory Visit: Payer: Self-pay

## 2017-10-18 MED ORDER — ASPIRIN EC 81 MG PO TBEC: 81.0000 mg | DELAYED_RELEASE_TABLET | Freq: Every day | ORAL | 3 refills | Status: DC

## 2017-10-19 DIAGNOSIS — G4733 Obstructive sleep apnea (adult) (pediatric): Secondary | ICD-10-CM | POA: Diagnosis not present

## 2017-10-21 ENCOUNTER — Other Ambulatory Visit: Payer: Self-pay | Admitting: Cardiology

## 2017-10-24 ENCOUNTER — Ambulatory Visit: Payer: Medicare Other | Admitting: Family Medicine

## 2017-10-25 ENCOUNTER — Other Ambulatory Visit (INDEPENDENT_AMBULATORY_CARE_PROVIDER_SITE_OTHER): Payer: Medicare Other

## 2017-10-25 ENCOUNTER — Ambulatory Visit (INDEPENDENT_AMBULATORY_CARE_PROVIDER_SITE_OTHER): Payer: Medicare Other | Admitting: Internal Medicine

## 2017-10-25 ENCOUNTER — Encounter: Payer: Self-pay | Admitting: Internal Medicine

## 2017-10-25 VITALS — BP 134/90 | HR 65 | Ht 66.0 in | Wt 295.0 lb

## 2017-10-25 DIAGNOSIS — R0609 Other forms of dyspnea: Secondary | ICD-10-CM

## 2017-10-25 DIAGNOSIS — R05 Cough: Secondary | ICD-10-CM

## 2017-10-25 DIAGNOSIS — R058 Other specified cough: Secondary | ICD-10-CM

## 2017-10-25 DIAGNOSIS — J42 Unspecified chronic bronchitis: Secondary | ICD-10-CM | POA: Diagnosis not present

## 2017-10-25 DIAGNOSIS — J45991 Cough variant asthma: Secondary | ICD-10-CM

## 2017-10-25 DIAGNOSIS — J45909 Unspecified asthma, uncomplicated: Secondary | ICD-10-CM | POA: Diagnosis not present

## 2017-10-25 LAB — CBC WITH DIFFERENTIAL/PLATELET
BASOS ABS: 0.1 10*3/uL (ref 0.0–0.1)
Basophils Relative: 1.2 % (ref 0.0–3.0)
EOS PCT: 2.4 % (ref 0.0–5.0)
Eosinophils Absolute: 0.2 10*3/uL (ref 0.0–0.7)
HEMATOCRIT: 41 % (ref 36.0–46.0)
Hemoglobin: 13.7 g/dL (ref 12.0–15.0)
LYMPHS PCT: 35.8 % (ref 12.0–46.0)
Lymphs Abs: 3 10*3/uL (ref 0.7–4.0)
MCHC: 33.4 g/dL (ref 30.0–36.0)
MCV: 86.9 fl (ref 78.0–100.0)
MONOS PCT: 7 % (ref 3.0–12.0)
Monocytes Absolute: 0.6 10*3/uL (ref 0.1–1.0)
Neutro Abs: 4.5 10*3/uL (ref 1.4–7.7)
Neutrophils Relative %: 53.6 % (ref 43.0–77.0)
PLATELETS: 236 10*3/uL (ref 150.0–400.0)
RBC: 4.72 Mil/uL (ref 3.87–5.11)
RDW: 14.4 % (ref 11.5–15.5)
WBC: 8.3 10*3/uL (ref 4.0–10.5)

## 2017-10-25 LAB — NITRIC OXIDE: NITRIC OXIDE: 15

## 2017-10-25 MED ORDER — BUDESONIDE-FORMOTEROL FUMARATE 80-4.5 MCG/ACT IN AERO: 2.0000 | INHALATION_SPRAY | Freq: Two times a day (BID) | RESPIRATORY_TRACT | 11 refills | Status: DC

## 2017-10-25 MED ORDER — BUDESONIDE-FORMOTEROL FUMARATE 80-4.5 MCG/ACT IN AERO: 2.0000 | INHALATION_SPRAY | Freq: Two times a day (BID) | RESPIRATORY_TRACT | 0 refills | Status: DC

## 2017-10-25 NOTE — Patient Instructions (Addendum)
Stop advair and start symbicort 80 Take 2 puffs first thing in am and then another 2 puffs about 12 hours later.  Be sure to continue nexium 40 mg Take 30- 60 min before your first and last meals of the day     Only use your albuterol as a rescue medication to be used if you can't catch your breath by resting or doing a relaxed purse lip breathing pattern.  - The less you use it, the better it will work when you need it. - Ok to use up to  every 4 hours if you must but call for immediate appointment if use goes up over your usual need   GERD (REFLUX)  is an extremely common cause of respiratory symptoms just like yours , many times with no obvious heartburn at all.    It can be treated with medication, but also with lifestyle changes including elevation of the head of your bed (ideally with 6 inch  bed blocks),  Smoking cessation, avoidance of late meals, excessive alcohol, and avoid fatty foods, chocolate, peppermint, colas, red wine, and acidic juices such as orange juice.  NO MINT OR MENTHOL PRODUCTS SO NO COUGH DROPS   USE SUGARLESS CANDY INSTEAD (Jolley ranchers or Stover's or Life Savers) or even ice chips will also do - the key is to swallow to prevent all throat clearing. NO OIL BASED VITAMINS - use powdered substitutes.   Please remember to go to the lab and x-ray department downstairs in the basement  for your tests - we will call you with the results when they are available.     Please schedule a follow up office visit in 4 weeks, sooner if needed  with all medications /inhalers/ solutions in hand so we can verify exactly what you are taking. This includes all medications from all doctors and over the counters  add needs cxr on return

## 2017-10-25 NOTE — Assessment & Plan Note (Addendum)
Spirometry 10/25/2017  FEV1 1.30 (63%)  Ratio 85    Symptoms are markedly disproportionate to objective findings and not clear this is actually much of a  lung problem but pt does appear to have difficult to sort out respiratory symptoms of unknown origin for which  DDX  = almost all start with A and  include Adherence, Ace Inhibitors, Acid Reflux, Active Sinus Disease, Alpha 1 Antitripsin deficiency, Anxiety masquerading as Airways dz,  ABPA,  Allergy(esp in young), Aspiration (esp in elderly), Adverse effects of meds,  Active smokers, A bunch of PE's (a small clot burden can't cause this syndrome unless there is already severe underlying pulm or vascular dz with poor reserve), Anemia and thyroid dz,  plus two Bs  = Bronchiectasis and Beta blocker use..and one C= CHF    Adherence is always the initial "prime suspect" and is a multilayered concern that requires a "trust but verify" approach in every patient - starting with knowing how to use medications, especially inhalers, correctly, keeping up with refills and understanding the fundamental difference between maintenance and prns vs those medications only taken for a very short course and then stopped and not refilled.  - see hfa teaching under cough variant asthma  - with all meds in hand using a trust but verify approach to confirm accurate Medication  Reconciliation The principal here is that until we are certain that the  patients are doing what we've asked, it makes no sense to ask them to do more.    ? Acid (or non-acid) GERD > always difficult to exclude as up to 75% of pts in some series report no assoc GI/ Heartburn symptoms> rec max (24h)  acid suppression and diet restrictions/ reviewed and instructions given in writing.   ? Adverse effects of Advair > change to hfa  ? ACEi effects > refractory symptoms started on acei and have not resolved on losartan - For reasons that may related to vascular permability and nitric oxide pathways but  not elevated  bradykinin levels (as seen with  ACEi use) losartan in the generic form has been reported now from mulitple sources  to cause a similar pattern of non-specific  upper airway symptoms as seen with acei.   This has not been reported with exposure to the other ARB's to date, so it seems reasonable for now to consider either generic diovan or avapro if ARB needed or use an alternative class altogether.  See:  Lelon Frohlich Allergy Asthma Immunol  2008: 101: p [phone]     ? Allergy > doubt with feno so low so just use low dose ics and  send profile   ? Anemia/ thyroid dz> ruled out today   ? Anxiety > usually at the bottom of this list of usual suspects but should be much higher on this pt's based on H and P     ? CHF >  Ruled out today with bnp so low and active dyspnea

## 2017-10-25 NOTE — Progress Notes (Signed)
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     10/25/2017 1st [facility] office visit/ Virda Betters   On advair x sev years Chief Complaint  Patient presents with  . Pulmonary consult    Dr. Percival Spanish referred pt for SOB, diagnosed with asthma but SOb with exertion for years, dry cough, acid reflux  at hs tends cough and wakes sev times at night x sev years/ on cpap helps some Feels rested and not symptomatic first thing in am then starts the throat clearing and downhill from there the rest of the day Was on ACEi > d/c 05/02/17 and changed to ARB = losartan.   No obvious day to day or daytime variability or assoc excess/ purulent sputum or mucus plugs or hemoptysis or cp or chest tightness, subjective wheeze or overt sinus  symptoms. No unusual exposure hx or h/o childhood pna/ asthma or knowledge of premature birth.  Sleeping ok while on cpap  without nocturnal  or early am exacerbation  of respiratory  c/o's or need for noct saba. Also denies any obvious fluctuation of symptoms with weather or environmental changes or other aggravating or alleviating factors except as outlined above   Current Allergies, Complete Past Medical History, Past Surgical History, Family History, and Social History were reviewed in [employer] record.         Current Meds  Medication Sig  . albuterol (PROVENTIL) (2.5 MG/3ML) 0.083% nebulizer solution Take 3 mLs (2.5 mg total) by  nebulization every 6 (six) hours as needed for wheezing or shortness of breath.  Marland Kitchen amLODipine (NORVASC) 10 MG tablet Take 1 tablet (10 mg total) daily by mouth.  Marland Kitchen aspirin EC 81 MG tablet Take 1 tablet (81 mg total) by mouth daily.  . baclofen (LIORESAL) 10 MG tablet Take 1 tablet (10 mg total) by mouth at bedtime.  . DULoxetine (CYMBALTA) 60 MG capsule Take 1 capsule (60 mg total) by mouth daily.  Marland Kitchen esomeprazole (NEXIUM) 40 MG capsule Take 40 mg by mouth 2 (two) times daily before a meal.  . Fluticasone-Salmeterol (ADVAIR DISKUS) 250-50 MCG/DOSE AEPB Inhale 1 puff into the lungs 2 (two) times daily.  . furosemide (LASIX) 20 MG tablet Take 2 tablets (40 mg total) by mouth daily.  . isosorbide dinitrate (ISORDIL) 30 MG tablet Take 30 mg by mouth daily.  Marland Kitchen loratadine (CLARITIN) 10 MG tablet Take 1 tablet (10 mg total) by mouth daily.  Marland Kitchen losartan (COZAAR) 100 MG tablet Take 1 tablet (100 mg total) daily by mouth.  . mirabegron ER (MYRBETRIQ) 25 MG TB24 tablet Take 1 tablet (25 mg total) by mouth daily.  Marland Kitchen morphine (MS CONTIN) 15 MG 12 hr tablet Take 1 tablet (15 mg total) every 12 (twelve) hours by mouth.  . nebivolol (BYSTOLIC) 5 MG tablet Take 1 tablet (5 mg total) by mouth daily.  . nitroGLYCERIN (NITRODUR - DOSED IN MG/24 HR) 0.2 mg/hr patch  Apply 1/4th patch to affected shoulder, change daily  . oxyCODONE-acetaminophen (ROXICET) 5-325 MG tablet Take 1 tablet every 8 (eight) hours as needed by mouth for severe pain (breakthrough).  Marland Kitchen PRESCRIPTION MEDICATION Place 1 application into the left eye at bedtime as needed (rash). Opthalmic ointment  . PROAIR HFA 108 (90 Base) MCG/ACT inhaler Inhale 1-2 puffs into the lungs every 6 (six) hours as needed for wheezing or shortness of breath.  . rizatriptan (MAXALT) 10 MG tablet Take 1 tablet for headache.  May repeat once in 2 hours if needed.  Do not exceed 2 tablets in 24 hours.  . ticagrelor (BRILINTA) 90 MG TABS tablet Take 1 tablet (90 mg total) by  mouth 2 (two) times daily.  Marland Kitchen tiZANidine (ZANAFLEX) 4 MG tablet Take 1 tablet (4 mg total) by mouth at bedtime.      Review of Systems  Constitutional: Negative for fever and unexpected weight change.  HENT: Negative for congestion, dental problem, ear pain, nosebleeds, postnasal drip, rhinorrhea, sinus pressure, sneezing, sore throat and trouble swallowing.   Eyes: Negative for redness and itching.  Respiratory: Positive for shortness of breath. Negative for cough, chest tightness and wheezing.   Cardiovascular: Negative for palpitations and leg swelling.  Gastrointestinal: Positive for abdominal pain. Negative for nausea and vomiting.  Genitourinary: Negative for dysuria.  Musculoskeletal: Negative for joint swelling.  Skin: Negative for rash.  Neurological: Positive for headaches.  Hematological: Does not bruise/bleed easily.  Psychiatric/Behavioral: Negative for dysphoric mood. The patient is nervous/anxious.        Objective:   Physical Exam  Obese scooter bound bf with severe hoarseness and continuous throat clearing    Wt Readings from Last 3 Encounters:  10/25/17 295 lb (133.8 kg)  10/08/17 292 lb (132.5 kg)  09/28/17 290 lb 3.2 oz (131.6 kg)     Vital signs reviewed - Note on arrival 02 sats  99% on RA   HEENT: nl dentition, turbinates bilaterally, and oropharynx. Nl external ear canals without cough reflex   NECK :  without JVD/Nodes/TM/ nl carotid upstrokes bilaterally   LUNGS: no acc muscle use,  Nl contour chest which is clear to A and P bilaterally without cough on insp or exp maneuvers   CV:  RRR  no s3 or murmur or increase in P2, and 1+ pitting bilateral sym lower ext  edema   ABD:  soft and nontender with nl inspiratory excursion in the supine position. No bruits or organomegaly appreciated, bowel sounds nl  MS:  Nl gait/ ext warm without deformities, calf tenderness, cyanosis or clubbing No obvious joint restrictions   SKIN: warm and dry without  lesions    NEURO:  alert, approp, nl sensorium with  no motor or cerebellar deficits apparent     CXR PA and Lateral:   10/25/2017 :    I personally reviewed images and agree with radiology impression as follows:    did not go for cxr as req   Labs ordered/ reviewed:      Chemistry      Component Value Date/Time   NA 141 10/25/2017 1716   K 4.3 10/25/2017 1716   CL 107 10/25/2017 1716   CO2 25 10/25/2017 1716   BUN 13 10/25/2017 1716   CREATININE 0.79 10/25/2017 1716   CREATININE 0.95 11/15/2016 1013      Component Value Date/Time   CALCIUM 9.4 10/25/2017 1716  Lab Results  Component Value Date   WBC 8.3 10/25/2017   HGB 13.7 10/25/2017   HCT 41.0 10/25/2017   MCV 86.9 10/25/2017   PLT 236.0 10/25/2017       EOS                       0.2                                                                   10/25/2017      Lab Results  Component Value Date   TSH 1.19 10/25/2017     Lab Results  Component Value Date   PROBNP 20.0 10/25/2017                  Assessment & Plan:

## 2017-10-26 ENCOUNTER — Encounter: Payer: Self-pay | Admitting: Internal Medicine

## 2017-10-26 DIAGNOSIS — J45991 Cough variant asthma: Secondary | ICD-10-CM | POA: Insufficient documentation

## 2017-10-26 LAB — BASIC METABOLIC PANEL
BUN: 13 mg/dL (ref 6–23)
CHLORIDE: 107 meq/L (ref 96–112)
CO2: 25 mEq/L (ref 19–32)
CREATININE: 0.79 mg/dL (ref 0.40–1.20)
Calcium: 9.4 mg/dL (ref 8.4–10.5)
GFR: 92.96 mL/min (ref >60.00)
GLUCOSE: 100 mg/dL — AB (ref 70–99)
POTASSIUM: 4.3 meq/L (ref 3.5–5.1)
Sodium: 141 mEq/L (ref 135–145)

## 2017-10-26 LAB — RESPIRATORY ALLERGY PROFILE REGION II ~~LOC~~
ALLERGEN, D PTERNOYSSINUS, D1: 1.27 kU/L — AB
Allergen, Cedar tree, t12: <0.1 kU/L
Allergen, Comm Silver Birch, t9: <0.1 kU/L
Allergen, Cottonwood, t14: <0.1 kU/L
Allergen, Mulberry, t76: <0.1 kU/L
Allergen, Oak,t7: <0.1 kU/L
Aspergillus fumigatus, m3: <0.1 kU/L
CLADOSPORIUM HERBARUM (M2) IGE: <0.1 kU/L
CLASS: 0
CLASS: 0
CLASS: 0
CLASS: 0
CLASS: 0
CLASS: 0
CLASS: 0
CLASS: 0
CLASS: 0
CLASS: 2
CLASS: 2
COMMON RAGWEED (SHORT) (W1) IGE: 0.47 kU/L — ABNORMAL HIGH
Cat Dander: <0.1 kU/L
Class: 0
Class: 0
Class: 0
Class: 0
Class: 0
Class: 0
Class: 0
Class: 0
Class: 0
Class: 0
Class: 0
Class: 1
Class: 2
Cockroach: <0.1 kU/L
D. FARINAE: 1.12 kU/L — AB
Dog Dander: 1.49 kU/L — ABNORMAL HIGH
IgE (Immunoglobulin E), Serum: 139 kU/L — ABNORMAL HIGH (ref <=114)
Pecan/Hickory Tree IgE: <0.1 kU/L
Rough Pigweed  IgE: <0.1 kU/L

## 2017-10-26 LAB — BRAIN NATRIURETIC PEPTIDE: Pro B Natriuretic peptide (BNP): 20 pg/mL (ref 0.0–100.0)

## 2017-10-26 LAB — INTERPRETATION:

## 2017-10-26 LAB — TSH: TSH: 1.19 u[IU]/mL (ref 0.35–4.50)

## 2017-10-26 NOTE — Assessment & Plan Note (Addendum)
Upper airway cough syndrome (previously labeled PNDS),  is so named because it's frequently impossible to sort out how much is  CR/sinusitis with freq throat clearing (which can be related to primary GERD)   vs  causing  secondary (" extra esophageal")  GERD from wide swings in gastric pressure that occur with throat clearing, often  promoting self use of mint and menthol lozenges that reduce the lower esophageal sphincter tone and exacerbate the problem further in a cyclical fashion.   These are the same pts (now being labeled as having "irritable larynx syndrome" by some cough centers) who not infrequently have a history of having failed to tolerate ace inhibitors,  dry powder inhalers or biphosphonates or report having atypical/extraesophageal reflux symptoms that don't respond to standard doses of PPI  and are easily confused as having aecopd or asthma flares by even experienced allergists/ pulmonologists (myself included).   For now focus on max rx for gerd and leave off advair   Keep in mind For reasons that may related to vascular permability and nitric oxide pathways but not elevated  bradykinin levels (as seen with  ACEi use) losartan in the generic form has been reported now from mulitple sources  to cause a similar pattern of non-specific  upper airway symptoms as seen with acei.   This has not been reported with exposure to the other ARB's to date, so it seems reasonable to consider  either generic diovan or avapro if ARB needed or use an alternative class altogether if she continues to have poorly controlled resp symptoms of unknown origin. See:  Lelon Frohlich Allergy Asthma Immunol  2008: 101: p [phone]      Total time devoted to counseling  > 50 % of initial 60 min office visit:  review case with pt/ discussion of options/alternatives/ personally creating written customized instructions  in presence of pt  then going over those specific  Instructions directly with the pt including how to use all of  the meds but in particular covering each new medication in detail and the difference between the maintenance= "automatic" meds and the prns using an action plan format for the latter (If this problem/symptom => do that organization reading Left to right).  Please see AVS from this visit for a full list of these instructions which I personally wrote for this pt and  are unique to this visit.

## 2017-10-26 NOTE — Assessment & Plan Note (Addendum)
10/25/2017  After extensive coaching inhaler device  effectiveness =    75% Changed advair dpi to symb 80  2bid   - FENO 10/25/2017  =   15 - Allergy profile 10/25/2017 >  Eos 0.1 /  IgE  Sent    Regroup in 4 weeks on symb 80- 2bid which may not actually be needed but if she does have any asthma that is advair responsive, at least it's not a likely to cloud the picture with UACS (much more likely with DPI's in my experience)

## 2017-10-29 NOTE — Progress Notes (Signed)
Spoke with pt and notified of results per Dr. Wert. Pt verbalized understanding and denied any questions. 

## 2017-10-31 ENCOUNTER — Ambulatory Visit: Payer: Medicare Other | Admitting: Family Medicine

## 2017-11-07 ENCOUNTER — Ambulatory Visit: Payer: Medicare Other | Admitting: Family Medicine

## 2017-11-14 ENCOUNTER — Ambulatory Visit (INDEPENDENT_AMBULATORY_CARE_PROVIDER_SITE_OTHER): Payer: Medicare Other | Admitting: Family Medicine

## 2017-11-14 ENCOUNTER — Ambulatory Visit: Payer: Self-pay

## 2017-11-14 VITALS — BP 169/66 | Ht 66.0 in | Wt 293.0 lb

## 2017-11-14 DIAGNOSIS — M25511 Pain in right shoulder: Secondary | ICD-10-CM

## 2017-11-14 NOTE — Patient Instructions (Signed)
You do have small partial tears of your rotator cuff but no full thickness ones that would require [place] to send you to a surgeon at this time. Start physical therapy - have them call us if they can't find the referral but this was faxed over to them. Increase your nitro patches to 1/2 patch, change daily. Follow up with me in 6 weeks for reevaluation.

## 2017-11-15 ENCOUNTER — Encounter: Payer: Self-pay | Admitting: Family Medicine

## 2017-11-15 NOTE — Assessment & Plan Note (Signed)
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  Increase nitro patches to 1/2 patch, change daily.  Start physical therapy and do home exercises.  F/u in 6 weeks for reevaluation.

## 2017-11-15 NOTE — Progress Notes (Signed)
PCP: Lucious Groves, DO  Subjective:   HPI: Patient is a 67 y.o. female here for right shoulder, arm pain.  1/16: Patient reports she's had right shoulder and elbow pain dating back several months. Believes this may have started in June to July 2018 when she had several falls. Pain is sharp lateral right shoulder and medial right elbow. Associated spasms of these areas. Difficulty getting comfortable. + night pain. Worse with reaching. Prior issues with right rotator cuff and told this was 'going bad' No skin changes, numbness.  3/20: Patient reports her elbow feels completely better. She states she still having pain in her lateral right shoulder though that is worse at nighttime it can radiate down the arm at times. She gets spasms anterolateral shoulder that radiates to the back as well. Tolerating the nitroglycerin patches without any problems. Has not started physical therapy yet. States that the subacromial injection only helped her that day. No skin changes, numbness.  Past Medical History:  Diagnosis Date  . Arthritis   . Asthma   . Bipolar disorder ([place])   . CAD (coronary artery disease) 07/07/2016   a. s/p stenting x 2 in [place] to the LAD, RCA is chronically occluded, LVEDP was 34  . Cancer ([facility])    cervical  . Depression   . Fibromyalgia   . GERD (gastroesophageal reflux disease)   . History of benign pituitary tumor   . History of kidney stones    1972  . Hyperlipidemia   . Hypertension   . Migraine   . Morbid obesity ([place])   . Peripheral vascular disease ([place])   . Pneumonia   . Seizures ([place])    during surgery for pituitary tumor  . Sleep apnea    with cpap  . Vertigo     Current Outpatient Medications on File Prior to Visit  Medication Sig Dispense Refill  . albuterol (PROVENTIL) (2.5 MG/3ML) 0.083% nebulizer solution Take 3 mLs (2.5 mg total) by nebulization every 6 (six) hours as needed for wheezing or shortness of breath. 150 mL 1  .  amLODipine (NORVASC) 10 MG tablet Take 1 tablet (10 mg total) daily by mouth. 90 tablet 2  . aspirin EC 81 MG tablet Take 1 tablet (81 mg total) by mouth daily. 90 tablet 3  . baclofen (LIORESAL) 10 MG tablet Take 1 tablet (10 mg total) by mouth at bedtime. 30 each 2  . budesonide-formoterol (SYMBICORT) 80-4.5 MCG/ACT inhaler Inhale 2 puffs into the lungs 2 (two) times daily. 1 Inhaler 11  . DULoxetine (CYMBALTA) 60 MG capsule Take 1 capsule (60 mg total) by mouth daily. 90 capsule 3  . esomeprazole (NEXIUM) 40 MG capsule Take 40 mg by mouth 2 (two) times daily before a meal.    . furosemide (LASIX) 20 MG tablet Take 2 tablets (40 mg total) by mouth daily. 180 tablet 3  . isosorbide dinitrate (ISORDIL) 30 MG tablet Take 30 mg by mouth daily.    Marland Kitchen loratadine (CLARITIN) 10 MG tablet Take 1 tablet (10 mg total) by mouth daily. 90 tablet 3  . losartan (COZAAR) 100 MG tablet Take 1 tablet (100 mg total) daily by mouth. 90 tablet 0  . mirabegron ER (MYRBETRIQ) 25 MG TB24 tablet Take 1 tablet (25 mg total) by mouth daily. 90 tablet 0  . morphine (MS CONTIN) 15 MG 12 hr tablet Take 1 tablet (15 mg total) every 12 (twelve) hours by mouth. 60 tablet 0  . nebivolol (BYSTOLIC) 5 MG tablet  Take 1 tablet (5 mg total) by mouth daily. 90 tablet 3  . nitroGLYCERIN (NITRODUR - DOSED IN MG/24 HR) 0.2 mg/hr patch Apply 1/4th patch to affected shoulder, change daily 30 patch 1  . oxyCODONE-acetaminophen (ROXICET) 5-325 MG tablet Take 1 tablet every 8 (eight) hours as needed by mouth for severe pain (breakthrough). 30 tablet 0  . PRESCRIPTION MEDICATION Place 1 application into the left eye at bedtime as needed (rash). Opthalmic ointment    . PROAIR HFA 108 (90 Base) MCG/ACT inhaler Inhale 1-2 puffs into the lungs every 6 (six) hours as needed for wheezing or shortness of breath. 18 g 5  . rizatriptan (MAXALT) 10 MG tablet Take 1 tablet for headache.  May repeat once in 2 hours if needed.  Do not exceed 2 tablets in 24  hours. 10 tablet 3  . ticagrelor (BRILINTA) 90 MG TABS tablet Take 1 tablet (90 mg total) by mouth 2 (two) times daily. 180 tablet 2  . tiZANidine (ZANAFLEX) 4 MG tablet Take 1 tablet (4 mg total) by mouth at bedtime. 30 tablet 3  . alendronate (FOSAMAX) 70 MG tablet TAKE 1 TABLET EVERY 7 DAYS BY MOUTH. TAKE WITH A FULL GLASS OF WATER ON AN EMPTY STOMACH.  3  . atorvastatin (LIPITOR) 20 MG tablet TAKE 1 TABLET (20 MG TOTAL) BY MOUTH DAILY. 90 tablet 1  . nitroGLYCERIN (NITROSTAT) 0.4 MG SL tablet Place 1 tablet (0.4 mg total) under the tongue every 5 (five) minutes as needed for chest pain. 25 tablet 3   No current facility-administered medications on file prior to visit.     Past Surgical History:  Procedure Laterality Date  . ANKLE FUSION Bilateral   . carpel tunnel release Bilateral 2017  . CORONARY ANGIOPLASTY    . ESOPHAGOGASTRODUODENOSCOPY (EGD) WITH PROPOFOL N/A 06/14/2017   Procedure: ESOPHAGOGASTRODUODENOSCOPY (EGD) WITH PROPOFOL;  Surgeon: Wilford Corner, MD;  Location: [facility];  Service: Endoscopy;  Laterality: N/A;  . EXCISION / CURETTAGE BONE CYST PHALANGES OF FOOT  2014   Removal of foot cyst   . PARTIAL HYSTERECTOMY  unknown   Patient still has ovaries  . pituitary tumor removal  1999   removed as much as they could, on optic nerve  . TONSILLECTOMY    . TOTAL KNEE ARTHROPLASTY     TKR X 3  2 on the left and 1 on the right    Allergies  Allergen Reactions  . Ibuprofen     Other reaction(s): Upset Stomach  . Ampicillin Nausea Only  . Asa [Aspirin] Diarrhea    Other reaction(s): Upset Stomach  . Darvon [Propoxyphene] Nausea And Vomiting  . Demeclocycline Other (See Comments)    Nerves feeling  . Eggs Or Egg-Derived Products Diarrhea  . Lactalbumin Diarrhea  . Lisinopril Cough  . Milk-Related Compounds Diarrhea  . Salicylates Other (See Comments)    Unknown Unknown   . Tetracycline Hcl Hives and Nausea Only  . Tetracyclines & Related Other (See  Comments)    Nerves feeling     Social History   Socioeconomic History  . Marital status: Divorced    Spouse name: Not on file  . Number of children: Not on file  . Years of education: Not on file  . Highest education level: Not on file  Occupational History  . Not on file  Social Needs  . Financial resource strain: Not on file  . Food insecurity:    Worry: Not on file    Inability: Not on  file  . Transportation needs:    Medical: Not on file    Non-medical: Not on file  Tobacco Use  . Smoking status: Never Smoker  . Smokeless tobacco: Never Used  Substance and Sexual Activity  . Alcohol use: Yes    Comment: Socially. [name].  . Drug use: No  . Sexual activity: Never  Lifestyle  . Physical activity:    Days per week: Not on file    Minutes per session: Not on file  . Stress: Not on file  Relationships  . Social connections:    Talks on phone: Not on file    Gets together: Not on file    Attends religious service: Not on file    Active member of club or organization: Not on file    Attends meetings of clubs or organizations: Not on file    Relationship status: Not on file  . Intimate partner violence:    Fear of current or ex partner: Not on file    Emotionally abused: Not on file    Physically abused: Not on file    Forced sexual activity: Not on file  Other Topics Concern  . Not on file  Social History Narrative   Current Social History        Who lives at home: Lives with youngest daughter, "Olivia Mackie" 09/25/2016    Transportation: Public 03/22/2034   Important Relationships & Pets: 2 daughters (52, 38), 1 son (61), 10 grandchildren, 4 great grandchildren 09/25/2016    Current Stressors: Recently moved from [place], [place] due to threat of NH placement 09/25/2016   Religious / Personal Beliefs: Christian 09/25/2016   Interests / Fun: Dancing 09/25/2016   Other: Participates in [employer] 09/25/2016    Family History  Problem Relation Age of Onset  . Heart  disease Mother 38       CABG, pacemaker, valve  . Cancer Father   . Heart disease Sister        Fluid around the heart  . Breast cancer Neg Hx     BP (!) 169/66   Ht 5\' 6"  (1.676 m)   Wt 293 lb (132.9 kg)   BMI 47.29 kg/m   Review of Systems: See HPI above.     Objective:  Physical Exam:  Gen: NAD, comfortable in exam room.  Right shoulder: No swelling, ecchymoses.  No gross deformity. Diffuse tenderness of shoulder. Full passive motion.  Active motion limited to 45 degrees ER, 120 degrees abduction and flexion. Positive Hawkins, Neers. Negative Yergasons. Strength 5/5 with empty can and resisted internal/external rotation.  Pain empty can and ER. NV intact distally.  MSK u/s right shoulder: biceps tendon intact on long and trans views.  AC joint with moderate arthropathy but no geyser sign.  Anechoic area at insertion of subscapularis consistent with partial tear.  Infraspinatus normal without tears.  Supraspinatus with partial thickness insertional side and interstitial tears.     Assessment & Plan:  1. 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  Increase nitro patches to 1/2 patch, change daily.  Start physical therapy and do home exercises.  F/u in 6 weeks for reevaluation.

## 2017-11-16 DIAGNOSIS — G4733 Obstructive sleep apnea (adult) (pediatric): Secondary | ICD-10-CM | POA: Diagnosis not present

## 2017-11-19 ENCOUNTER — Ambulatory Visit (INDEPENDENT_AMBULATORY_CARE_PROVIDER_SITE_OTHER): Payer: Medicare Other | Admitting: Podiatry

## 2017-11-19 ENCOUNTER — Encounter: Payer: Self-pay | Admitting: Podiatry

## 2017-11-19 DIAGNOSIS — G8929 Other chronic pain: Secondary | ICD-10-CM | POA: Diagnosis not present

## 2017-11-19 DIAGNOSIS — M79673 Pain in unspecified foot: Secondary | ICD-10-CM

## 2017-11-19 DIAGNOSIS — M19071 Primary osteoarthritis, right ankle and foot: Secondary | ICD-10-CM | POA: Diagnosis not present

## 2017-11-19 DIAGNOSIS — M79675 Pain in left toe(s): Secondary | ICD-10-CM | POA: Diagnosis not present

## 2017-11-19 DIAGNOSIS — B351 Tinea unguium: Secondary | ICD-10-CM

## 2017-11-19 DIAGNOSIS — M19072 Primary osteoarthritis, left ankle and foot: Secondary | ICD-10-CM | POA: Diagnosis not present

## 2017-11-19 DIAGNOSIS — M79674 Pain in right toe(s): Secondary | ICD-10-CM | POA: Diagnosis not present

## 2017-11-19 DIAGNOSIS — G4733 Obstructive sleep apnea (adult) (pediatric): Secondary | ICD-10-CM | POA: Diagnosis not present

## 2017-11-19 MED ORDER — DICLOFENAC SODIUM 1 % TD GEL: 2.0000 g | Freq: Four times a day (QID) | TRANSDERMAL | 2 refills | Status: DC

## 2017-11-19 NOTE — Progress Notes (Signed)
Subjective: Alisha [name] presents the office today for follow-up evaluation of chronic bilateral foot pain.  She states that she wears a ankle brace on the right side intermittently.  She has not been able to start physical therapy due to transportation and insurance issues.  She is also has been going to physical therapy for the right shoulder but she cannot go because transportation.  Because of her right shoulder hurting she could not use a cane she is been having to rely on the scooter more.  She also states her nails are thick and discolored she cannot trim herself.  She has no other concerns today no acute changes. Denies any systemic complaints such as fevers, chills, nausea, vomiting. No acute changes since last appointment, and no other complaints at this time.   Objective: AAO x3, NAD DP/PT pulses palpable bilaterally, CRT less than 3 seconds There is mild continued tenderness to palpation to bilateral foot mostly along sinus tarsi.  There is tenderness on the anterior ankle joint line.  There is diffuse tenderness to the foot and ankle but there is no specific area pinpoint bony tenderness or pain to vibratory sensation.  There is no significant edema, erythema, increase in warmth.  Overall the exam appears to be unchanged.  Nails appear to be hypertrophic, dystrophic, discolored with yellow-brown discoloration x10.  Tenderness nails 1-5 bilaterally.  No edema, erythema or any signs of infection. No open lesions or pre-ulcerative lesions.  No pain with calf compression, swelling, warmth, erythema  Assessment: Chronic bilateral foot pain, arthritis; symptomatic onychomycosis  Plan: -All treatment options discussed with the patient including all alternatives, risks, complications.  -At this time I want her to try to start physical therapy when she can.  Also prescribed Voltaren gel.  Ankle brace as needed.  Long-term she may benefit from a more custom molded brace we are going to hold off on  that for now. -Nails are debrided x10 without any complications or bleeding -Patient encouraged to call the office with any questions, concerns, change in symptoms.   Trula Slade DPM

## 2017-11-23 ENCOUNTER — Ambulatory Visit: Payer: Medicare Other | Admitting: Internal Medicine

## 2017-11-23 ENCOUNTER — Telehealth: Payer: Self-pay | Admitting: Internal Medicine

## 2017-11-23 DIAGNOSIS — J45909 Unspecified asthma, uncomplicated: Secondary | ICD-10-CM | POA: Diagnosis not present

## 2017-11-23 DIAGNOSIS — J42 Unspecified chronic bronchitis: Secondary | ICD-10-CM | POA: Diagnosis not present

## 2017-11-23 NOTE — Telephone Encounter (Signed)
Called [facility] and spoke with Estill Bamberg verifying the information that was stated in message that was sent to triage.  Per Estill Bamberg, since pt is wanting to switch DME's to [facility], pt will need to have a qualifying walk test in order for [place] to order 24hr O2 with the POC.  Have made Magda Paganini, CMA aware of this message and am also going to route this encounter to Magda Paganini so she will be aware of it when pt comes to her appt at 9:45 with MW.

## 2017-11-26 ENCOUNTER — Ambulatory Visit: Payer: Medicare Other | Admitting: Internal Medicine

## 2017-11-28 NOTE — Addendum Note (Signed)
Addended by: Orson Gear on: 11/28/2017 11:22 AM   Modules accepted: Orders

## 2017-11-30 ENCOUNTER — Ambulatory Visit (INDEPENDENT_AMBULATORY_CARE_PROVIDER_SITE_OTHER)
Admission: RE | Admit: 2017-11-30 | Discharge: 2017-11-30 | Disposition: A | Discharge status: Home / Self Care | Payer: Medicare Other | Source: Ambulatory Visit | Attending: Internal Medicine | Admitting: Internal Medicine

## 2017-11-30 ENCOUNTER — Encounter: Payer: Self-pay | Admitting: Internal Medicine

## 2017-11-30 ENCOUNTER — Ambulatory Visit (INDEPENDENT_AMBULATORY_CARE_PROVIDER_SITE_OTHER): Payer: Medicare Other | Admitting: Internal Medicine

## 2017-11-30 VITALS — BP 142/84 | HR 70 | Ht 66.0 in | Wt 297.0 lb

## 2017-11-30 DIAGNOSIS — R0609 Other forms of dyspnea: Secondary | ICD-10-CM

## 2017-11-30 DIAGNOSIS — R05 Cough: Secondary | ICD-10-CM | POA: Diagnosis not present

## 2017-11-30 DIAGNOSIS — J45991 Cough variant asthma: Secondary | ICD-10-CM | POA: Diagnosis not present

## 2017-11-30 DIAGNOSIS — R058 Other specified cough: Secondary | ICD-10-CM

## 2017-11-30 NOTE — Patient Instructions (Addendum)
Stop fosfamax  Plan A = Automatic =  symbicort 80 Take 2 puffs first thing in am and then another 2 puffs about 12 hours later.    Work on Engineer, technical sales technique:  relax and gently blow all the way out then take a nice smooth deep breath back in, triggering the inhaler at same time you start breathing in.  Hold for up to 5 seconds if you can. Blow out thru nose. Rinse and gargle with water when done     Plan B = Backup Only use your albuterol nebulizer as a rescue medication to be used if you can't catch your breath by resting or doing a relaxed purse lip breathing pattern.  - The less you use it, the better it will work when you need it. - Ok to use the inhaler up to    every 4 hours if you must but call for appointment if use goes up over your usual need     GERD (REFLUX)  is an extremely common cause of respiratory symptoms just like yours , many times with no obvious heartburn at all.    It can be treated with medication, but also with lifestyle changes including elevation of the head of your bed (ideally with 6 inch  bed blocks),  Smoking cessation, avoidance of late meals, excessive alcohol, and avoid fatty foods, chocolate, peppermint, colas, red wine, and acidic juices such as orange juice.  NO MINT OR MENTHOL PRODUCTS SO NO COUGH DROPS   USE SUGARLESS CANDY INSTEAD (Jolley ranchers or Stover's or Life Savers) or even ice chips will also do - the key is to swallow to prevent all throat clearing. NO OIL BASED VITAMINS - use powdered substitutes.   For drainage / throat tickle try take CHLORPHENIRAMINE  4 mg - take one every 4 hours as needed - available over the counter- may cause drowsiness so start with just a bedtime dose or two and see how you tolerate it before trying in daytime     Please remember to go to the  x-ray department downstairs in the basement  for your tests - we will call you with the results when they are available.      Please schedule a follow up office  visit in 4 weeks, sooner if needed with pfts on return

## 2017-11-30 NOTE — Progress Notes (Signed)
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    History of Present Illness  10/25/2017 1st [facility] office visit/ [facility]   On advair x sev years Chief Complaint  Patient presents with  . Pulmonary consult    Dr. Percival Spanish referred pt for SOB, diagnosed with asthma but SOb with exertion for years, dry cough, acid reflux  at hs tends cough and wakes sev times at night x sev years/ on cpap helps some Feels rested and not symptomatic first thing in am then starts the throat clearing and downhill from there the rest of the day Was on ACEi > d/c 05/02/17 and changed to ARB = losartan rec Stop advair and start symbicort 80 Take 2 puffs first thing in am and then another 2 puffs about 12 hours later. Be sure to continue nexium 40 mg Take 30- 60 min before your first and last meals of the day  Only use your albuterol as a rescue medication GERD diet  Please remember to go to the lab and x-ray department downstairs in the basement  for your tests - we will call you with the results when they are available.  Please schedule a follow up office visit in 4 weeks, sooner if needed  with all medications /inhalers/ solutions in hand so we can verify exactly what you are taking. This includes all medications from all doctors and over the counters  add needs cxr on return   11/30/2017  f/u ov/[facility] re:  Cough x 1980 ? Cough variant asthma/ uacs / did not bring meds because thinks   "they are as listed" (they are not)  Chief Complaint  Patient presents with  . Follow-up    SOB is worse then last ov    Dyspnea:  Back stops before breathing so uses scooter when goes out Cough: daytime continuous urge to clear throat Sleep: fine on cpap SABA use:  Only uses nebulizer 3 x week - list says she has proair but pt denies though "that list is right"  On fosfamax x years   No obvious day to day or daytime variability or assoc excess/ purulent sputum or mucus plugs or hemoptysis or cp or chest tightness, subjective wheeze or overt sinus or hb symptoms. No unusual exposure hx or h/o childhood pna/ asthma or knowledge of premature birth.  Sleeping ok flat without nocturnal  or early am exacerbation  of respiratory  c/o's or need for noct saba. Also denies any obvious fluctuation of symptoms with weather or environmental changes or other aggravating or alleviating factors except as outlined above   Current Allergies, Complete Past Medical History, Past Surgical History, Family History, and Social History were reviewed in [facility] record.  ROS  The following are not active complaints unless bolded Hoarseness, sore throat, dysphagia, dental problems, itching, sneezing,  nasal congestion or discharge of excess mucus or purulent secretions, ear  ache,   fever, chills, sweats, unintended wt loss or wt gain, classically pleuritic or exertional cp,  orthopnea pnd or leg swelling, presyncope, palpitations, abdominal pain, anorexia, nausea, vomiting, diarrhea  or change in bowel habits or change in bladder habits, change in stools or change in urine, dysuria, hematuria,  rash, arthralgias, visual complaints, headache, numbness, weakness or ataxia or problems with walking or coordination,  change in mood/affect or memory.        Current Meds  Medication Sig  . albuterol (PROVENTIL) (2.5 MG/3ML) 0.083% nebulizer solution Take 3 mLs (2.5 mg total) by nebulization every 6  (six) hours as needed for wheezing or shortness of breath.  Marland Kitchen amLODipine (NORVASC) 10 MG tablet Take 1 tablet (10 mg total) daily by mouth.  Marland Kitchen aspirin EC 81 MG tablet Take 1 tablet (81 mg total) by mouth daily.  . baclofen (LIORESAL) 10 MG tablet Take 1 tablet (10 mg total) by mouth at bedtime.  . budesonide-formoterol (SYMBICORT) 80-4.5 MCG/ACT inhaler Inhale 2 puffs into the lungs 2 (two) times daily.  . diclofenac sodium (VOLTAREN) 1 % GEL Apply 2 g topically 4 (four) times daily. Rub into affected area of foot 2 to 4 times daily  . DULoxetine (CYMBALTA) 60 MG capsule Take 1 capsule (60 mg total) by mouth daily.  Marland Kitchen esomeprazole (NEXIUM) 40 MG capsule Take 40 mg by mouth 2 (two) times daily before a meal.  . furosemide (LASIX) 20 MG tablet Take 2 tablets (40 mg total) by mouth daily.  . isosorbide dinitrate (ISORDIL) 30 MG tablet Take 30 mg by mouth daily.  Marland Kitchen losartan (COZAAR) 100 MG tablet Take 1 tablet (100 mg total) daily by mouth.  . mirabegron ER (MYRBETRIQ) 25 MG TB24 tablet Take 1 tablet (25 mg total) by mouth daily.  Marland Kitchen morphine (MS CONTIN) 15 MG 12 hr tablet Take 1 tablet (15 mg total) every 12 (twelve) hours by mouth.  . nebivolol (BYSTOLIC) 5 MG tablet Take 1 tablet (5 mg total) by mouth daily.  . nitroGLYCERIN (NITRODUR - DOSED IN MG/24 HR) 0.2 mg/hr patch Apply 1/4th patch to affected shoulder, change daily  . oxyCODONE-acetaminophen (ROXICET) 5-325 MG tablet Take 1 tablet every 8 (eight) hours as needed by mouth for severe pain (breakthrough).  Marland Kitchen PRESCRIPTION MEDICATION Place 1 application into the left eye at bedtime as needed (rash). Opthalmic ointment  . rizatriptan (MAXALT) 10 MG tablet Take 1 tablet for headache.  May repeat once in 2 hours if needed.  Do not exceed 2 tablets in 24 hours.  . ticagrelor (BRILINTA) 90 MG TABS tablet Take 1 tablet (90 mg total) by mouth 2 (two) times daily.  Marland Kitchen tiZANidine (ZANAFLEX) 4 MG tablet Take 1 tablet (4 mg total) by mouth at bedtime.  . [   loratadine (CLARITIN) 10 MG tablet Take 1 tablet (10 mg total) by mouth daily.  . [ PROAIR HFA 108 (90 Base) MCG/ACT inhaler Inhale 1-2 puffs into the lungs every 6 (six) hours as needed for wheezing or shortness of breath.                   Objective:   Physical Exam  Obese scooter bound bf with severe hoarseness and continuous throat clearing    11/30/2017          297   10/25/17 295 lb (133.8 kg)  10/08/17 292 lb (132.5 kg)  09/28/17 290 lb 3.2 oz (131.6 kg)         HEENT: nl  turbinates bilaterally, and oropharynx which is pristine. Nl external ear canals without cough reflex/ top denture   NECK :  without JVD/Nodes/TM/ nl carotid upstrokes bilaterally   LUNGS: no acc muscle use,  Nl contour chest which is clear to A and P bilaterally without cough on insp or exp maneuvers   CV:  RRR  no s3 or murmur or increase in P2, and  1+ sym bilateral lower ext edema  ABD:  soft and nontender with nl inspiratory excursion in the supine position. No bruits or organomegaly appreciated, bowel sounds nl  MS:  Nl gait/ ext warm without deformities, calf tenderness, cyanosis or clubbing No obvious joint restrictions   SKIN: warm and dry without lesions    NEURO:  alert, approp, nl sensorium with  no motor or cerebellar deficits apparent.           CXR PA and Lateral:   11/30/2017 :    I personally reviewed images and agree with radiology impression as follows:   No acute disease.                      Assessment & Plan:

## 2017-11-30 NOTE — Progress Notes (Signed)
LMTCB

## 2017-12-02 ENCOUNTER — Encounter: Payer: Self-pay | Admitting: Internal Medicine

## 2017-12-02 NOTE — Assessment & Plan Note (Signed)
Body mass index is 47.94 kg/m.  -  trending up further  Lab Results  Component Value Date   TSH 1.19 10/25/2017     Contributing to gerd risk/ doe/reviewed the need and the process to achieve and maintain neg calorie balance > defer f/u primary care including intermittently monitoring thyroid status

## 2017-12-02 NOTE — Assessment & Plan Note (Addendum)
10/25/2017  After extensive coaching inhaler device  effectiveness =    75% Changed advair dpi to symb 80  2bid  Spirometry 10/25/2017  FEV1 1.30 (63%)  Ratio 85 p am advair 250   - FENO 10/25/2017  =   15 - Allergy profile 10/25/2017 >  Eos 0.1 /  IgE  139 RAST Pos dog/dust / ragweed   -  11/30/2017  After extensive coaching inhaler device  effectiveness =    75% > try symb 80 2bid   DDX of  difficult airways management almost all start with A and  include Adherence, Ace Inhibitors, Acid Reflux, Active Sinus Disease, Alpha 1 Antitripsin deficiency, Anxiety masquerading as Airways dz,  ABPA,  Allergy(esp in young), Aspiration (esp in elderly), Adverse effects of meds,  Active smokers, A bunch of PE's (a small clot burden can't cause this syndrome unless there is already severe underlying pulm or vascular dz with poor reserve) plus two Bs  = Bronchiectasis and Beta blocker use..and one C= CHF   Adherence is always the initial "prime suspect" and is a multilayered concern that requires a "trust but verify" approach in every patient - starting with knowing how to use medications, especially inhalers, correctly, keeping up with refills and understanding the fundamental difference between maintenance and prns vs those medications only taken for a very short course and then stopped and not refilled.  - see hfa teaching - critical she return with all meds in hand using a trust but verify approach to confirm accurate Medication  Reconciliation The principal here is that until we are certain that the  patients are doing what we've asked, it makes no sense to ask them to do more.  ? Acid (or non-acid) GERD > always difficult to exclude as up to 75% of pts in some series report no assoc GI/ Heartburn symptoms> rec d/c fosfamax for now and continue max (24h)  acid suppression and diet restrictions/ reviewed     ? Allergy/asthma > not that impressive in terms of correlation/ response to rx but hard to exclude so  reasonable to resume  symb 80 2bid pending f/u with pfts and may need MCT to sort out   ? Anxiety/depression/ deconditioning >   usually at the bottom of this list of usual suspects but should be   higher on this pt's based on H and P and note already on psychotropics and may interfere with adherence and also interpretation of response or lack thereof to symptom management which can be quite subjective.   ? ACEi/ARB effects > For reasons that may related to vascular permability and nitric oxide pathways but not elevated  bradykinin levels (as seen with  ACEi use) losartan in the generic form has been reported now from mulitple sources  to cause a similar pattern of non-specific  upper airway symptoms as seen with acei.   This has not been reported with exposure to the other ARB's to date, so it might be necessary to change to  either generic diovan or avapro if ARB needed or use an alternative class altogether.  See:  Lelon Frohlich Allergy Asthma Immunol  2008: 101: p [phone]    ? Adverse drug effects > try off fosfamax as has been on it for years  ? BB effects > very unlikely on bystolic   F/u in 4 weeks with pfts   I had an extended discussion with the patient reviewing all relevant studies completed to date and  lasting 15 to 20  minutes of a 25 minute visit    Each maintenance medication was reviewed in detail including most importantly the difference between maintenance and prns and under what circumstances the prns are to be triggered using an action plan format that is not reflected in the computer generated alphabetically organized AVS.    Please see AVS for specific instructions unique to this visit that I personally wrote and verbalized to the the pt in detail and then reviewed with pt  by my nurse highlighting any  changes in therapy recommended at today's visit to their plan of care.

## 2017-12-02 NOTE — Assessment & Plan Note (Signed)
Try off fosfamax 11/30/2017 >>>  Consider trial of gabapentin if not responding to rx

## 2017-12-03 DIAGNOSIS — J45909 Unspecified asthma, uncomplicated: Secondary | ICD-10-CM | POA: Diagnosis not present

## 2017-12-03 DIAGNOSIS — J42 Unspecified chronic bronchitis: Secondary | ICD-10-CM | POA: Diagnosis not present

## 2017-12-04 NOTE — Progress Notes (Signed)
LMTCB

## 2017-12-05 ENCOUNTER — Other Ambulatory Visit: Payer: Self-pay | Admitting: Cardiology

## 2017-12-06 NOTE — Telephone Encounter (Signed)
REFILL 

## 2017-12-13 ENCOUNTER — Telehealth: Payer: Self-pay | Admitting: *Deleted

## 2017-12-13 ENCOUNTER — Ambulatory Visit (INDEPENDENT_AMBULATORY_CARE_PROVIDER_SITE_OTHER): Payer: Medicare Other | Admitting: Internal Medicine

## 2017-12-13 ENCOUNTER — Other Ambulatory Visit: Payer: Self-pay

## 2017-12-13 ENCOUNTER — Encounter: Payer: Self-pay | Admitting: Internal Medicine

## 2017-12-13 VITALS — BP 174/88 | HR 54 | Temp 98.3°F | Ht 66.0 in | Wt 301.2 lb

## 2017-12-13 DIAGNOSIS — N3941 Urge incontinence: Secondary | ICD-10-CM

## 2017-12-13 DIAGNOSIS — R51 Headache: Secondary | ICD-10-CM

## 2017-12-13 DIAGNOSIS — R413 Other amnesia: Secondary | ICD-10-CM | POA: Diagnosis not present

## 2017-12-13 DIAGNOSIS — Z7983 Long term (current) use of bisphosphonates: Secondary | ICD-10-CM

## 2017-12-13 DIAGNOSIS — G4733 Obstructive sleep apnea (adult) (pediatric): Secondary | ICD-10-CM

## 2017-12-13 DIAGNOSIS — J45991 Cough variant asthma: Secondary | ICD-10-CM | POA: Diagnosis not present

## 2017-12-13 DIAGNOSIS — Z9989 Dependence on other enabling machines and devices: Secondary | ICD-10-CM

## 2017-12-13 DIAGNOSIS — M797 Fibromyalgia: Secondary | ICD-10-CM

## 2017-12-13 DIAGNOSIS — L4050 Arthropathic psoriasis, unspecified: Secondary | ICD-10-CM

## 2017-12-13 DIAGNOSIS — R7301 Impaired fasting glucose: Secondary | ICD-10-CM | POA: Diagnosis not present

## 2017-12-13 DIAGNOSIS — R5383 Other fatigue: Secondary | ICD-10-CM

## 2017-12-13 DIAGNOSIS — R5381 Other malaise: Secondary | ICD-10-CM

## 2017-12-13 DIAGNOSIS — Z79899 Other long term (current) drug therapy: Secondary | ICD-10-CM

## 2017-12-13 DIAGNOSIS — E559 Vitamin D deficiency, unspecified: Secondary | ICD-10-CM | POA: Diagnosis not present

## 2017-12-13 DIAGNOSIS — M1990 Unspecified osteoarthritis, unspecified site: Secondary | ICD-10-CM | POA: Diagnosis not present

## 2017-12-13 DIAGNOSIS — G894 Chronic pain syndrome: Secondary | ICD-10-CM | POA: Diagnosis not present

## 2017-12-13 DIAGNOSIS — I10 Essential (primary) hypertension: Secondary | ICD-10-CM

## 2017-12-13 DIAGNOSIS — Z9119 Patient's noncompliance with other medical treatment and regimen: Secondary | ICD-10-CM

## 2017-12-13 DIAGNOSIS — G3184 Mild cognitive impairment, so stated: Secondary | ICD-10-CM

## 2017-12-13 DIAGNOSIS — Z86010 Personal history of colonic polyps: Secondary | ICD-10-CM

## 2017-12-13 DIAGNOSIS — Z79891 Long term (current) use of opiate analgesic: Secondary | ICD-10-CM

## 2017-12-13 MED ORDER — MIRABEGRON ER 25 MG PO TB24: 25.0000 mg | ORAL_TABLET | Freq: Every day | ORAL | 3 refills | Status: DC

## 2017-12-13 NOTE — Assessment & Plan Note (Signed)
Has been following with dr Matilde Sprang, she reports recently seening him back, cannot remember details of visit, and I do not have a note readily avaliable.  She notes that she is still getting up 7-9 times a night to urinate, urinating in diaper when using CPAP>>thus not using CPAP. She notes that she had a follow up with urology later this month for additional testing.  A: Urge incontinence/OAB  P: Refilled Myrbetriq 25mg  daily, per my last avaliable note she is also on detrol 4mg  daily.  Will see about getting updated records from urology.

## 2017-12-13 NOTE — Assessment & Plan Note (Signed)
I discussed need for repeat colonoscopy and had placed referral for GI, now with writing this note I am reminded she already follows with dr Michail Sermon, we will need to get updated records of the plan.

## 2017-12-13 NOTE — Assessment & Plan Note (Signed)
HPI: reports she is more forgetful lately, forgetting to take medications especially.  She has also had more delayed recall of items that used to come more easily to her. She preformed a 26/30 for MOCA.  A: MCI  P: I suspect she has some MCI, overall her moca score (25 +1 for education levels) is not bad,  She has had a MRI of brain last year and a recent TSH, will check a B12 level and plan to monitor this.  Lack of sleep/ non adherence to CPAP may also be playing a role.

## 2017-12-13 NOTE — Assessment & Plan Note (Signed)
She has been on chronic opioid therapy for diffuse OA, Fibromyalgia, and various pains.  She reports to me that most recently she has been taking MS Contin 15mg  once a day, which helps her pain.  As previously noted I was concerned when she moved to our clinic last year about polypharmcy as well as some withdraw from opioid withdrawal.  Her last UDS was appropriately postive for morphine.  My review of her CSRS however shows that she is not filling the scripts that I had provided. At her last visit I provided 2 month of Rx for MS Contin and 2 for oxycodone for breakthorugh pain.  She does not appear to have gotten the oxycodone filled at all.  And only 1 or the 2 scripts of MS Contin filled.  Even if she had filled them she has make these last.  I told her to request a refill from me if she needs further, when this happens I will plan to discuss this with her again and possibly give her a very limited script as we wean off these medications.

## 2017-12-13 NOTE — Telephone Encounter (Signed)
Call made to pharmacy at pcp's request to make sure fosamax rx had been discontinued.Despina Hidden Cassady4/18/201912:35 PM

## 2017-12-13 NOTE — Assessment & Plan Note (Addendum)
-  recheck vit d - I again discussed that she can d/c fosamax because of her excellent DEXA result. I will have nursing cancel refills at the pharmacy.

## 2017-12-13 NOTE — Progress Notes (Signed)
Subjective:  HPI: [name] is a 67 y.o. female who presents for f/u HTN  Please see Assessment and Plan below for the status of her chronic medical problems.  Review of Systems: Review of Systems  Constitutional: Positive for malaise/fatigue.  Respiratory: Positive for cough. Negative for shortness of breath and wheezing.   Cardiovascular: Negative for chest pain and leg swelling.  Musculoskeletal: Positive for joint pain.  Neurological: Positive for headaches. Negative for focal weakness.  Psychiatric/Behavioral: Negative for depression.    Objective:  Physical Exam: Vitals:   12/13/17 1034  BP: (!) 174/88  Pulse: (!) 54  Temp: 98.3 F (36.8 C)  TempSrc: Oral  SpO2: 92%  Weight: (!) 301 lb 3.2 oz (136.6 kg)  Height: 5\' 6"  (1.676 m)   Physical Exam  Constitutional: She appears well-developed and well-nourished.  HENT:  Impacted cerumen left ear  Eyes: Pupils are equal, round, and reactive to light.  Cardiovascular: Normal rate and regular rhythm.  Pulmonary/Chest: Effort normal.  Musculoskeletal: She exhibits edema.  Neurological:  MOCA 26/30  Nursing note and vitals reviewed.  Assessment & Plan:  Essential hypertension HPI: Reports she had had trouble with her memory lately, has been forgetting to take her medications.  She also has not been using CPAP as when she does she falls into a "deep sleep" and wakes with wet diaper.  A: Essential HTN- not at goal  P: We discussed getting her daughter to help with reminding her to take medications, also use as part of routine, she has recently started this with her inhalers and has led to better adherence. -Continue bystolic 5mg  daily - Continue lasix 40mg  daily -Continue amlodipine 10mg  daily -Continue losartan 100mg  daily -Restart use of CPAP  Cough variant asthma vs UACS HPI: since I last saw her she has been referred to dr wert, has had better coaching about inhaler use, reports she is doing better on  this font. She apparently was still taking fosamax and has been told again to discontinue as this could be playing a role in her cough.  A: Cough Variant asthma  P;Continue current medications  Psoriatic arthritis ([place]) Main complaint today is bilateral ankle pain, has recently seen podiatry- reviewed Dr Leigh Aurora note from 3/25.  Bilateral arthritis of ankles, has had custom brace made, not currently using, she plan to start using this soon.  I also completed handicap form today  Urge incontinence Has been following with dr Matilde Sprang, she reports recently seening him back, cannot remember details of visit, and I do not have a note readily avaliable.  She notes that she is still getting up 7-9 times a night to urinate, urinating in diaper when using CPAP>>thus not using CPAP. She notes that she had a follow up with urology later this month for additional testing.  A: Urge incontinence/OAB  P: Refilled Myrbetriq 25mg  daily, per my last avaliable note she is also on detrol 4mg  daily.  Will see about getting updated records from urology.  Chronic pain syndrome She has been on chronic opioid therapy for diffuse OA, Fibromyalgia, and various pains.  She reports to me that most recently she has been taking MS Contin 15mg  once a day, which helps her pain.  As previously noted I was concerned when she moved to our clinic last year about polypharmcy as well as some withdraw from opioid withdrawal.  Her last UDS was appropriately postive for morphine.  My review of her CSRS however shows that she is not filling the scripts  that I had provided. At her last visit I provided 2 month of Rx for MS Contin and 2 for oxycodone for breakthorugh pain.  She does not appear to have gotten the oxycodone filled at all.  And only 1 or the 2 scripts of MS Contin filled.  Even if she had filled them she has make these last.  I told her to request a refill from me if she needs further, when this happens I will plan to  discuss this with her again and possibly give her a very limited script as we wean off these medications.  Vitamin D deficiency -recheck vit d - I again discussed that she can d/c fosamax because of her excellent DEXA result. I will have nursing cancel refills at the pharmacy.  Mild cognitive impairment HPI: reports she is more forgetful lately, forgetting to take medications especially.  She has also had more delayed recall of items that used to come more easily to her. She preformed a 26/30 for MOCA.  A: MCI  P: I suspect she has some MCI, overall her moca score (25 +1 for education levels) is not bad,  She has had a MRI of brain last year and a recent TSH, will check a B12 level and plan to monitor this.  Lack of sleep/ non adherence to CPAP may also be playing a role.  History of colon polyps I discussed need for repeat colonoscopy and had placed referral for GI, now with writing this note I am reminded she already follows with dr Michail Sermon, we will need to get updated records of the plan.   Medications Ordered Meds ordered this encounter  Medications  . mirabegron ER (MYRBETRIQ) 25 MG TB24 tablet    Sig: Take 1 tablet (25 mg total) by mouth daily.    Dispense:  90 tablet    Refill:  3   Other Orders Orders Placed This Encounter  Procedures  . CMP14 + Anion Gap  . Lipid Profile  . Hemoglobin A1c  . Vitamin D (25 hydroxy)  . Vitamin B12   Follow Up: Return in about 2 months (around 02/12/2018), or if symptoms worsen or fail to improve.

## 2017-12-13 NOTE — Assessment & Plan Note (Signed)
Main complaint today is bilateral ankle pain, has recently seen podiatry- reviewed Dr Leigh Aurora note from 3/25.  Bilateral arthritis of ankles, has had custom brace made, not currently using, she plan to start using this soon.  I also completed handicap form today

## 2017-12-13 NOTE — Assessment & Plan Note (Signed)
HPI: Reports she had had trouble with her memory lately, has been forgetting to take her medications.  She also has not been using CPAP as when she does she falls into a "deep sleep" and wakes with wet diaper.  A: Essential HTN- not at goal  P: We discussed getting her daughter to help with reminding her to take medications, also use as part of routine, she has recently started this with her inhalers and has led to better adherence. -Continue bystolic 5mg  daily - Continue lasix 40mg  daily -Continue amlodipine 10mg  daily -Continue losartan 100mg  daily -Restart use of CPAP

## 2017-12-13 NOTE — Assessment & Plan Note (Addendum)
HPI: since I last saw her she has been referred to dr wert, has had better coaching about inhaler use, reports she is doing better on this font. She apparently was still taking fosamax and has been told again to discontinue as this could be playing a role in her cough.  A: Cough Variant asthma  P;Continue current medications

## 2017-12-14 ENCOUNTER — Ambulatory Visit (INDEPENDENT_AMBULATORY_CARE_PROVIDER_SITE_OTHER): Payer: Medicare Other | Admitting: Cardiology

## 2017-12-14 ENCOUNTER — Encounter (INDEPENDENT_AMBULATORY_CARE_PROVIDER_SITE_OTHER): Payer: Self-pay

## 2017-12-14 VITALS — BP 146/84 | HR 62 | Resp 20 | Ht 66.0 in | Wt 297.8 lb

## 2017-12-14 DIAGNOSIS — G4733 Obstructive sleep apnea (adult) (pediatric): Secondary | ICD-10-CM | POA: Diagnosis not present

## 2017-12-14 DIAGNOSIS — R0609 Other forms of dyspnea: Secondary | ICD-10-CM | POA: Diagnosis not present

## 2017-12-14 DIAGNOSIS — I10 Essential (primary) hypertension: Secondary | ICD-10-CM

## 2017-12-14 LAB — VITAMIN B12: VITAMIN B 12: 373 pg/mL (ref 232–1245)

## 2017-12-14 LAB — HEMOGLOBIN A1C
Est. average glucose Bld gHb Est-mCnc: 131 mg/dL
HEMOGLOBIN A1C: 6.2 % — AB (ref 4.8–5.6)

## 2017-12-14 LAB — CMP14 + ANION GAP
ALT: 12 IU/L (ref 0–32)
AST: 11 IU/L (ref 0–40)
Albumin/Globulin Ratio: 1.4 (ref 1.2–2.2)
Albumin: 4 g/dL (ref 3.6–4.8)
Alkaline Phosphatase: 116 IU/L (ref 39–117)
Anion Gap: 16 mmol/L (ref 10.0–18.0)
BILIRUBIN TOTAL: 0.5 mg/dL (ref 0.0–1.2)
BUN/Creatinine Ratio: 16 (ref 12–28)
BUN: 12 mg/dL (ref 8–27)
CALCIUM: 9.4 mg/dL (ref 8.7–10.3)
CHLORIDE: 107 mmol/L — AB (ref 96–106)
CO2: 21 mmol/L (ref 20–29)
Creatinine, Ser: 0.77 mg/dL (ref 0.57–1.00)
GFR, EST AFRICAN AMERICAN: 92 mL/min/{1.73_m2} (ref >59)
GFR, EST NON AFRICAN AMERICAN: 80 mL/min/{1.73_m2} (ref >59)
GLOBULIN, TOTAL: 2.8 g/dL (ref 1.5–4.5)
GLUCOSE: 98 mg/dL (ref 65–99)
Potassium: 4.4 mmol/L (ref 3.5–5.2)
SODIUM: 144 mmol/L (ref 134–144)
Total Protein: 6.8 g/dL (ref 6.0–8.5)

## 2017-12-14 LAB — CBC
Hematocrit: 40.4 % (ref 34.0–46.6)
Hemoglobin: 13.5 g/dL (ref 11.1–15.9)
MCH: 29 pg (ref 26.6–33.0)
MCHC: 33.4 g/dL (ref 31.5–35.7)
MCV: 87 fL (ref 79–97)
PLATELETS: 217 10*3/uL (ref 150–379)
RBC: 4.65 x10E6/uL (ref 3.77–5.28)
RDW: 14.4 % (ref 12.3–15.4)
WBC: 6.4 10*3/uL (ref 3.4–10.8)

## 2017-12-14 LAB — BASIC METABOLIC PANEL
BUN / CREAT RATIO: 16 (ref 12–28)
BUN: 14 mg/dL (ref 8–27)
CHLORIDE: 109 mmol/L — AB (ref 96–106)
CO2: 24 mmol/L (ref 20–29)
Calcium: 9.5 mg/dL (ref 8.7–10.3)
Creatinine, Ser: 0.85 mg/dL (ref 0.57–1.00)
GFR calc non Af Amer: 71 mL/min/{1.73_m2} (ref >59)
GFR, EST AFRICAN AMERICAN: 81 mL/min/{1.73_m2} (ref >59)
GLUCOSE: 110 mg/dL — AB (ref 65–99)
POTASSIUM: 4.7 mmol/L (ref 3.5–5.2)
Sodium: 146 mmol/L — ABNORMAL HIGH (ref 134–144)

## 2017-12-14 LAB — LIPID PANEL
CHOL/HDL RATIO: 4.4 ratio (ref 0.0–4.4)
Cholesterol, Total: 206 mg/dL — ABNORMAL HIGH (ref 100–199)
HDL: 47 mg/dL (ref >39)
LDL Calculated: 136 mg/dL — ABNORMAL HIGH (ref 0–99)
TRIGLYCERIDES: 116 mg/dL (ref 0–149)
VLDL Cholesterol Cal: 23 mg/dL (ref 5–40)

## 2017-12-14 LAB — TSH: TSH: 1.02 u[IU]/mL (ref 0.450–4.500)

## 2017-12-14 LAB — VITAMIN D 25 HYDROXY (VIT D DEFICIENCY, FRACTURES): VIT D 25 HYDROXY: 12.6 ng/mL — AB (ref 30.0–100.0)

## 2017-12-14 LAB — D-DIMER, QUANTITATIVE (NOT AT ARMC)

## 2017-12-14 LAB — PRO B NATRIURETIC PEPTIDE: NT-PRO BNP: 26 pg/mL (ref 0–301)
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
## 2017-12-14 NOTE — Patient Instructions (Addendum)
Medication Instructions:  Your physician recommends that you continue on your current medications as directed. Please refer to the Current Medication list given to you today.  If you need a refill on your cardiac medications, please contact your pharmacy first.  Labwork: Today for kidney function, complete blood count, thyroid, DDimer and BNP  Testing/Procedures: None ordered   Follow-Up: Your physician recommends that you schedule a follow-up appointment in: 3 months with Dr. Radford Pax    Any Other Special Instructions Will Be Listed Below (If Applicable).   Thank you for choosing [facility], RN  [phone]  If you need a refill on your cardiac medications before your next appointment, please call your pharmacy.

## 2017-12-17 ENCOUNTER — Encounter: Payer: Self-pay | Admitting: Internal Medicine

## 2017-12-17 DIAGNOSIS — R7303 Prediabetes: Secondary | ICD-10-CM | POA: Insufficient documentation

## 2017-12-17 DIAGNOSIS — G4733 Obstructive sleep apnea (adult) (pediatric): Secondary | ICD-10-CM | POA: Diagnosis not present

## 2017-12-19 ENCOUNTER — Encounter: Payer: Self-pay | Admitting: Family Medicine

## 2017-12-19 ENCOUNTER — Ambulatory Visit (INDEPENDENT_AMBULATORY_CARE_PROVIDER_SITE_OTHER): Payer: Medicare Other | Admitting: Family Medicine

## 2017-12-19 DIAGNOSIS — G8929 Other chronic pain: Secondary | ICD-10-CM | POA: Diagnosis not present

## 2017-12-19 DIAGNOSIS — M25511 Pain in right shoulder: Secondary | ICD-10-CM

## 2017-12-19 MED ORDER — METHYLPREDNISOLONE ACETATE 40 MG/ML IJ SUSP
40.0000 mg | Freq: Once | INTRAMUSCULAR | Status: AC
  Administered 2017-12-19: 40 mg via INTRA_ARTICULAR

## 2017-12-19 NOTE — Patient Instructions (Signed)
Continue your nitro patches. Start physical therapy and do home exercises on days you don't go to therapy. We repeated your cortisone injection today. Follow up with me in 6 weeks.

## 2017-12-23 ENCOUNTER — Encounter: Payer: Self-pay | Admitting: Family Medicine

## 2017-12-23 NOTE — Progress Notes (Addendum)
PCP: Lucious Groves, DO  Subjective:   HPI: Patient is a 67 y.o. female here for right shoulder, arm pain.  1/16: Patient reports she's had right shoulder and elbow pain dating back several months. Believes this may have started in June to July 2018 when she had several falls. Pain is sharp lateral right shoulder and medial right elbow. Associated spasms of these areas. Difficulty getting comfortable. + night pain. Worse with reaching. Prior issues with right rotator cuff and told this was 'going bad' No skin changes, numbness.  3/20: Patient reports her elbow feels completely better. She states she still having pain in her lateral right shoulder though that is worse at nighttime it can radiate down the arm at times. She gets spasms anterolateral shoulder that radiates to the back as well. Tolerating the nitroglycerin patches without any problems. Has not started physical therapy yet. States that the subacromial injection only helped her that day. No skin changes, numbness.  4/24: Patient returns with continued pain in her lateral right shoulder. Pain level 7/10 and sharp. She is starting PT on this coming Monday. Using nitro patches 1/2 patch and changing daily. No new injuries. No skin changes.  Past Medical History:  Diagnosis Date  . Arthritis   . Asthma   . Bipolar disorder ([place])   . CAD (coronary artery disease) 07/07/2016   a. s/p stenting x 2 in [place] to the LAD, RCA is chronically occluded, LVEDP was 34  . Cancer (HCC)    cervical  . Depression   . Fibromyalgia   . GERD (gastroesophageal reflux disease)   . History of benign pituitary tumor   . History of kidney stones    1972  . Hyperlipidemia   . Hypertension   . Migraine   . Morbid obesity ([place])   . Peripheral vascular disease ([place])   . Pneumonia   . Seizures ([place])    during surgery for pituitary tumor  . Sleep apnea    with cpap  . Vertigo     Current Outpatient Medications on File Prior to  Visit  Medication Sig Dispense Refill  . albuterol (PROVENTIL) (2.5 MG/3ML) 0.083% nebulizer solution Take 3 mLs (2.5 mg total) by nebulization every 6 (six) hours as needed for wheezing or shortness of breath. 150 mL 1  . amLODipine (NORVASC) 10 MG tablet Take 1 tablet (10 mg total) daily by mouth. 90 tablet 2  . aspirin EC 81 MG tablet Take 1 tablet (81 mg total) by mouth daily. 90 tablet 3  . atorvastatin (LIPITOR) 20 MG tablet TAKE 1 TABLET BY MOUTH EVERY DAY 90 tablet 3  . baclofen (LIORESAL) 10 MG tablet Take 1 tablet (10 mg total) by mouth at bedtime. 30 each 2  . budesonide-formoterol (SYMBICORT) 80-4.5 MCG/ACT inhaler Inhale 2 puffs into the lungs 2 (two) times daily. 1 Inhaler 11  . diclofenac sodium (VOLTAREN) 1 % GEL Apply 2 g topically 4 (four) times daily. Rub into affected area of foot 2 to 4 times daily 100 g 2  . DULoxetine (CYMBALTA) 60 MG capsule Take 1 capsule (60 mg total) by mouth daily. 90 capsule 3  . esomeprazole (NEXIUM) 40 MG capsule Take 40 mg by mouth 2 (two) times daily before a meal.    . furosemide (LASIX) 20 MG tablet Take 2 tablets (40 mg total) by mouth daily. 180 tablet 3  . isosorbide dinitrate (ISORDIL) 30 MG tablet Take 30 mg by mouth daily.    Marland Kitchen losartan (COZAAR) 100  MG tablet Take 1 tablet (100 mg total) daily by mouth. 90 tablet 0  . mirabegron ER (MYRBETRIQ) 25 MG TB24 tablet Take 1 tablet (25 mg total) by mouth daily. 90 tablet 3  . morphine (MS CONTIN) 15 MG 12 hr tablet Take 1 tablet (15 mg total) every 12 (twelve) hours by mouth. 60 tablet 0  . nebivolol (BYSTOLIC) 5 MG tablet Take 1 tablet (5 mg total) by mouth daily. 90 tablet 3  . nitroGLYCERIN (NITRODUR - DOSED IN MG/24 HR) 0.2 mg/hr patch Apply 1/4th patch to affected shoulder, change daily 30 patch 1  . nitroGLYCERIN (NITROSTAT) 0.4 MG SL tablet Place 1 tablet (0.4 mg total) under the tongue every 5 (five) minutes as needed for chest pain. 25 tablet 3  . PRESCRIPTION MEDICATION Place 1  application into the left eye at bedtime as needed (rash). Opthalmic ointment    . rizatriptan (MAXALT) 10 MG tablet Take 1 tablet for headache.  May repeat once in 2 hours if needed.  Do not exceed 2 tablets in 24 hours. 10 tablet 3  . ticagrelor (BRILINTA) 90 MG TABS tablet Take 1 tablet (90 mg total) by mouth 2 (two) times daily. 180 tablet 2  . tiZANidine (ZANAFLEX) 4 MG tablet Take 1 tablet (4 mg total) by mouth at bedtime. 30 tablet 3  . tolterodine (DETROL LA) 4 MG 24 hr capsule Take 4 mg by mouth daily.     No current facility-administered medications on file prior to visit.     Past Surgical History:  Procedure Laterality Date  . ANKLE FUSION Bilateral   . carpel tunnel release Bilateral 2017  . CORONARY ANGIOPLASTY    . ESOPHAGOGASTRODUODENOSCOPY (EGD) WITH PROPOFOL N/A 06/14/2017   Procedure: ESOPHAGOGASTRODUODENOSCOPY (EGD) WITH PROPOFOL;  Surgeon: Wilford Corner, MD;  Location: [facility];  Service: Endoscopy;  Laterality: N/A;  . EXCISION / CURETTAGE BONE CYST PHALANGES OF FOOT  2014   Removal of foot cyst   . PARTIAL HYSTERECTOMY  unknown   Patient still has ovaries  . pituitary tumor removal  1999   removed as much as they could, on optic nerve  . TONSILLECTOMY    . TOTAL KNEE ARTHROPLASTY     TKR X 3  2 on the left and 1 on the right    Allergies  Allergen Reactions  . Ibuprofen     Other reaction(s): Upset Stomach  . Ampicillin Nausea Only  . Asa [Aspirin] Diarrhea    Other reaction(s): Upset Stomach  . Darvon [Propoxyphene] Nausea And Vomiting  . Demeclocycline Other (See Comments)    Nerves feeling  . Eggs Or Egg-Derived Products Diarrhea  . Lactalbumin Diarrhea  . Lisinopril Cough  . Milk-Related Compounds Diarrhea  . Salicylates Other (See Comments)    Unknown Unknown   . Tetracycline Hcl Hives and Nausea Only  . Tetracyclines & Related Other (See Comments)    Nerves feeling     Social History   Socioeconomic History  . Marital status:  Divorced    Spouse name: Not on file  . Number of children: Not on file  . Years of education: Not on file  . Highest education level: Not on file  Occupational History  . Not on file  Social Needs  . Financial resource strain: Not on file  . Food insecurity:    Worry: Not on file    Inability: Not on file  . Transportation needs:    Medical: Not on file    Non-medical: Not  on file  Tobacco Use  . Smoking status: Never Smoker  . Smokeless tobacco: Never Used  Substance and Sexual Activity  . Alcohol use: Yes    Comment: Socially. [name].  . Drug use: No  . Sexual activity: Never  Lifestyle  . Physical activity:    Days per week: Not on file    Minutes per session: Not on file  . Stress: Not on file  Relationships  . Social connections:    Talks on phone: Not on file    Gets together: Not on file    Attends religious service: Not on file    Active member of club or organization: Not on file    Attends meetings of clubs or organizations: Not on file    Relationship status: Not on file  . Intimate partner violence:    Fear of current or ex partner: Not on file    Emotionally abused: Not on file    Physically abused: Not on file    Forced sexual activity: Not on file  Other Topics Concern  . Not on file  Social History Narrative   Current Social History        Who lives at home: Lives with youngest daughter, "Olivia Mackie" 09/25/2016    Transportation: Public 11/24/5186   Important Relationships & Pets: 2 daughters (52, 93), 1 son (42), 10 grandchildren, 4 great grandchildren 09/25/2016    Current Stressors: Recently moved from [place], [place] due to threat of NH placement 09/25/2016   Religious / Personal Beliefs: Christian 09/25/2016   Interests / Fun: Dancing 09/25/2016   Other: Participates in [employer] 09/25/2016    Family History  Problem Relation Age of Onset  . Heart disease Mother 36       CABG, pacemaker, valve  . Cancer Father   . Heart disease Sister         Fluid around the heart  . Breast cancer Neg Hx     BP 109/85   Ht 5\' 6"  (1.676 m)   Wt 293 lb (132.9 kg)   BMI 47.29 kg/m   Review of Systems: See HPI above.     Objective:  Physical Exam:  Gen: NAD, comfortable in exam room  Right shoulder: No swelling, ecchymoses.  No gross deformity. TTP lateral shoulder. ROM 45 degrees ER, 120 abduction and flexion. Positive Hawkins, Neers. Negative Yergasons. Strength 5/5 with empty can and resisted internal/external rotation.  Pain empty can and ER. NV intact distally.   Assessment & Plan:  1. 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  We discussed risks of repeating subacromial injection but she would like to proceed.  Start physical therapy, continue nitro patches.  F/u in 6 weeks.  After informed written consent timeout was performed, patient was seated on exam table. Right shoulder was prepped with alcohol swab and utilizing posterior approach, patient's right subacromial space was injected with 3:1 marcaine: depomedrol. Patient tolerated the procedure well without immediate complications.

## 2017-12-23 NOTE — Assessment & Plan Note (Signed)
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  We discussed risks of repeating subacromial injection but she would like to proceed.  Start physical therapy, continue nitro patches.  F/u in 6 weeks.

## 2017-12-24 DIAGNOSIS — J42 Unspecified chronic bronchitis: Secondary | ICD-10-CM | POA: Diagnosis not present

## 2017-12-24 DIAGNOSIS — J45909 Unspecified asthma, uncomplicated: Secondary | ICD-10-CM | POA: Diagnosis not present

## 2017-12-27 ENCOUNTER — Telehealth: Payer: Self-pay | Admitting: Podiatry

## 2017-12-27 DIAGNOSIS — R2681 Unsteadiness on feet: Secondary | ICD-10-CM

## 2017-12-27 DIAGNOSIS — R293 Abnormal posture: Secondary | ICD-10-CM | POA: Diagnosis not present

## 2017-12-27 DIAGNOSIS — M25511 Pain in right shoulder: Secondary | ICD-10-CM | POA: Diagnosis not present

## 2017-12-27 DIAGNOSIS — M25611 Stiffness of right shoulder, not elsewhere classified: Secondary | ICD-10-CM | POA: Diagnosis not present

## 2017-12-27 DIAGNOSIS — R262 Difficulty in walking, not elsewhere classified: Secondary | ICD-10-CM | POA: Diagnosis not present

## 2017-12-27 NOTE — Telephone Encounter (Signed)
Dr. Jacqualyn Posey order continuation for PT for gait instability. Unable to inform pt she we would send a continuation of PT to [facility] - In-office.

## 2017-12-27 NOTE — Addendum Note (Signed)
Addended by: Harriett Sine D on: 12/27/2017 10:10 AM   Modules accepted: Orders

## 2017-12-27 NOTE — Telephone Encounter (Signed)
Patient wants continuation of therapy at [facility]. She wants to know if she can have an extension? If you can call her back at [phone]

## 2017-12-28 ENCOUNTER — Other Ambulatory Visit: Payer: Self-pay | Admitting: Neurology

## 2017-12-31 DIAGNOSIS — M25511 Pain in right shoulder: Secondary | ICD-10-CM | POA: Diagnosis not present

## 2017-12-31 DIAGNOSIS — R293 Abnormal posture: Secondary | ICD-10-CM | POA: Diagnosis not present

## 2017-12-31 DIAGNOSIS — M25611 Stiffness of right shoulder, not elsewhere classified: Secondary | ICD-10-CM | POA: Diagnosis not present

## 2017-12-31 DIAGNOSIS — R262 Difficulty in walking, not elsewhere classified: Secondary | ICD-10-CM | POA: Diagnosis not present

## 2018-01-01 ENCOUNTER — Encounter: Payer: Self-pay | Admitting: Internal Medicine

## 2018-01-01 DIAGNOSIS — R262 Difficulty in walking, not elsewhere classified: Secondary | ICD-10-CM | POA: Diagnosis not present

## 2018-01-01 DIAGNOSIS — M25511 Pain in right shoulder: Secondary | ICD-10-CM | POA: Diagnosis not present

## 2018-01-01 DIAGNOSIS — R293 Abnormal posture: Secondary | ICD-10-CM | POA: Diagnosis not present

## 2018-01-01 DIAGNOSIS — M25611 Stiffness of right shoulder, not elsewhere classified: Secondary | ICD-10-CM | POA: Diagnosis not present

## 2018-01-01 NOTE — Progress Notes (Signed)
Received message from [facility] Referral coordinator, per their record patient is not due for colonoscopy until Oct 2019.  So it appears she is on a 5 year follow up for her 1cm tubular adenoma instead of the previous mentioned 3 years, the consult note did not previously address this interval directly. I suspect this may be due to the fact that the surgical pathogen report measured the polyp at max diameter of 0.9cm.

## 2018-01-02 DIAGNOSIS — R293 Abnormal posture: Secondary | ICD-10-CM | POA: Diagnosis not present

## 2018-01-02 DIAGNOSIS — M25611 Stiffness of right shoulder, not elsewhere classified: Secondary | ICD-10-CM | POA: Diagnosis not present

## 2018-01-02 DIAGNOSIS — R262 Difficulty in walking, not elsewhere classified: Secondary | ICD-10-CM | POA: Diagnosis not present

## 2018-01-02 DIAGNOSIS — M25511 Pain in right shoulder: Secondary | ICD-10-CM | POA: Diagnosis not present

## 2018-01-04 ENCOUNTER — Ambulatory Visit (INDEPENDENT_AMBULATORY_CARE_PROVIDER_SITE_OTHER): Payer: Medicare Other | Admitting: Internal Medicine

## 2018-01-04 ENCOUNTER — Encounter: Payer: Self-pay | Admitting: Internal Medicine

## 2018-01-04 VITALS — BP 132/76 | HR 52 | Ht 66.0 in | Wt 299.0 lb

## 2018-01-04 DIAGNOSIS — R05 Cough: Secondary | ICD-10-CM

## 2018-01-04 DIAGNOSIS — R058 Other specified cough: Secondary | ICD-10-CM

## 2018-01-04 DIAGNOSIS — J45991 Cough variant asthma: Secondary | ICD-10-CM

## 2018-01-04 LAB — PULMONARY FUNCTION TEST
DL/VA % PRED: 118 %
DL/VA: 5.99 ml/min/mmHg/L
DLCO UNC % PRED: 78 %
DLCO UNC: 21.1 ml/min/mmHg
DLCO cor % pred: 78 %
DLCO cor: 21.03 ml/min/mmHg
FEF 25-75 Post: 3.02 L/sec
FEF 25-75 Pre: 2.14 L/sec
FEF2575-%CHANGE-POST: 40 %
FEF2575-%Pred-Post: 159 %
FEF2575-%Pred-Pre: 113 %
FEV1-%CHANGE-POST: 10 %
FEV1-%PRED-PRE: 74 %
FEV1-%Pred-Post: 82 %
FEV1-PRE: 1.52 L
FEV1-Post: 1.68 L
FEV1FVC-%Change-Post: 0 %
FEV1FVC-%Pred-Pre: 113 %
FEV6-%Change-Post: 9 %
FEV6-%Pred-Post: 74 %
FEV6-%Pred-Pre: 67 %
FEV6-POST: 1.88 L
FEV6-PRE: 1.72 L
FEV6FVC-%PRED-POST: 104 %
FEV6FVC-%PRED-PRE: 104 %
FVC-%CHANGE-POST: 9 %
FVC-%Pred-Post: 71 %
FVC-%Pred-Pre: 65 %
FVC-POST: 1.88 L
FVC-Pre: 1.72 L
PRE FEV6/FVC RATIO: 100 %
Post FEV1/FVC ratio: 89 %
Post FEV6/FVC ratio: 100 %
Pre FEV1/FVC ratio: 89 %
RV % PRED: 73 %
RV: 1.66 L
TLC % pred: 70 %
TLC: 3.77 L

## 2018-01-04 NOTE — Progress Notes (Signed)
pft completed 01/04/18

## 2018-01-04 NOTE — Progress Notes (Signed)
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    History of Present Illness  10/25/2017 1st [facility] office visit/ Alisha [name]   On advair x sev years Chief Complaint  Patient presents with  . Pulmonary consult    Dr. Percival [name] referred pt for SOB, diagnosed with asthma but SOb with exertion for years, dry cough, acid reflux  at hs tends cough and wakes sev times at night x sev years/ on cpap helps some Feels rested and not symptomatic first thing in am then starts the throat clearing and downhill from there the rest of the day Was on ACEi > d/c 05/02/17 and changed to ARB = losartan rec Stop advair and start symbicort 80 Take 2 puffs first thing in am and then another 2 puffs about 12 hours later. Be sure to continue nexium 40 mg Take 30- 60 min before your first and last meals of the day  Only use your albuterol as a rescue medication GERD diet  Please remember to go to the lab and x-ray department downstairs in the basement  for your tests - we will call you with the results when they are available.  Please schedule a follow up office visit in 4 weeks, sooner if needed  with all medications /inhalers/ solutions in hand so we can verify exactly what you are taking. This includes all medications from all doctors and over the counters  add needs cxr on return   11/30/2017  f/u ov/Alisha [name] re:  Cough x 1980 ? Cough variant asthma/ uacs / did not bring meds because thinks   "they are as listed" (they are not)  Chief Complaint  Patient presents with  . Follow-up    SOB is worse then last ov    Dyspnea:  Back stops before breathing so uses scooter when goes out Cough: daytime continuous urge to clear throat Sleep: fine on cpap SABA use:  Only uses nebulizer 3 x week - list says she has proair but pt denies though "that list is right"  On fosfamax x years rec Stop fosfamax Plan A = Automatic =  symbicort 80 Take 2 puffs first thing in am and then another 2 puffs about 12 hours later.  Work on Interior and spatial designer:   Plan B = Backup Only use your albuterol nebulizer as a rescue medication to be used if you can't catch your breath by resting or doing a relaxed purse lip breathing pattern.  - The less you use it, the better it will work when you need it. - Ok to use the inhaler up to    every 4 hours if you must but call for appointment if use goes up over your usual need  GERD es. For drainage / throat tickle try take CHLORPHENIRAMINE  4 mg - take one every 4 hours as needed - available over the counter- may cause drowsiness so start  with just a bedtime dose or two and see how you tolerate it before trying in daytime    Please remember to go to the  x-ray department downstairs in the basement  for your tests - we will call you with the results when they are available.    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 Follow-up    PFT's done today. Breathing is unchanged. She is still clearing her throat alot. She is using her albuterol neb 2-3 x per wk.   Dyspnea: scooter for back pain > sob  Cough: constant throat clearing  Sleep: cough keeps her up despite 2 pillows, not using 1st gen H1 blockers per guidelines   SABA use:   2- 3 x per week but doesn't help cough       No obvious day to day or daytime variability or assoc excess/ purulent sputum or mucus plugs or  hemoptysis or cp or chest tightness, subjective wheeze or overt sinus or hb symptoms. No unusual exposure hx or h/o childhood pna/ asthma or knowledge of premature birth.   Also denies any obvious fluctuation of symptoms with weather or environmental changes or other aggravating or alleviating factors except as outlined above   Current Allergies, Complete Past Medical History, Past Surgical History, Family History, and Social History were reviewed in [employer] record.  ROS  The following are not active complaints unless bolded Hoarseness, sore throat, dysphagia, dental problems, itching, sneezing,  nasal congestion or discharge of excess mucus or purulent secretions, ear ache,   fever, chills, sweats, unintended wt loss or wt gain, classically pleuritic or exertional cp,  orthopnea pnd or arm/hand swelling  or leg swelling, presyncope, palpitations, abdominal pain, anorexia, nausea, vomiting, diarrhea  or change in bowel habits or change in bladder habits, change in stools or change in urine, dysuria, hematuria,  rash, arthralgias, visual complaints, headache, numbness, weakness or ataxia or problems with walking or coordination,  change in mood with cc anxiety and depression  or  memory.        Current Meds  Medication Sig  . albuterol (PROVENTIL) (2.5 MG/3ML) 0.083% nebulizer solution Take 3 mLs (2.5 mg total) by nebulization every 6 (six) hours as needed for wheezing or shortness of breath.  Marland Kitchen amLODipine (NORVASC) 10 MG tablet Take 1 tablet (10 mg total) daily by mouth.  Marland Kitchen aspirin EC 81 MG tablet Take 1 tablet (81 mg total) by mouth daily.  Marland Kitchen atorvastatin (LIPITOR) 20 MG tablet TAKE 1 TABLET BY MOUTH EVERY DAY  . baclofen (LIORESAL) 10 MG tablet Take 1 tablet (10 mg total) by mouth at bedtime.  . budesonide-formoterol (SYMBICORT) 80-4.5 MCG/ACT inhaler Inhale 2 puffs into the lungs 2 (two) times daily.  . diclofenac sodium (VOLTAREN) 1 % GEL Apply 2 g topically 4 (four)  times daily. Rub into affected area of foot 2 to 4 times daily  . DULoxetine (CYMBALTA) 60 MG capsule Take 1 capsule (60 mg total) by mouth daily.  Marland Kitchen esomeprazole (NEXIUM) 40 MG capsule Take 40 mg by mouth 2 (two) times daily before a meal.  . furosemide (LASIX) 20 MG tablet Take 2 tablets (40 mg total) by mouth daily.  . isosorbide dinitrate (ISORDIL) 30 MG tablet Take 30 mg by mouth daily.  Marland Kitchen losartan (COZAAR) 100 MG tablet Take 1 tablet (100 mg total) daily by mouth.  . mirabegron  ER (MYRBETRIQ) 25 MG TB24 tablet Take 1 tablet (25 mg total) by mouth daily.  Marland Kitchen morphine (MS CONTIN) 15 MG 12 hr tablet Take 1 tablet (15 mg total) every 12 (twelve) hours by mouth.  . nebivolol (BYSTOLIC) 5 MG tablet Take 1 tablet (5 mg total) by mouth daily.  . nitroGLYCERIN (NITRODUR - DOSED IN MG/24 HR) 0.2 mg/hr patch Apply 1/4th patch to affected shoulder, change daily  . PRESCRIPTION MEDICATION Place 1 application into the left eye at bedtime as needed (rash). Opthalmic ointment  . rizatriptan (MAXALT) 10 MG tablet Take 1 tablet for headache.  May repeat once in 2 hours if needed.  Do not exceed 2 tablets in 24 hours.  . ticagrelor (BRILINTA) 90 MG TABS tablet Take 1 tablet (90 mg total) by mouth 2 (two) times daily.  Marland Kitchen tiZANidine (ZANAFLEX) 4 MG tablet Take 1 tablet (4 mg total) by mouth at bedtime.  . tolterodine (DETROL LA) 4 MG 24 hr capsule Take 4 mg by mouth daily.                   Objective:   Physical Exam  Obese hoarse bf constant throat clearing / pseudowheeze resolves with plm     01/04/2018       299  11/30/2017          297   10/25/17 295 lb (133.8 kg)  10/08/17 292 lb (132.5 kg)  09/28/17 290 lb 3.2 oz (131.6 kg)     Vital signs reviewed - Note on arrival 02 sats  99% on RA       HEENT: nl  turbinates bilaterally, and oropharynx. Nl external ear canals without cough reflex/ top denture   NECK :  without JVD/Nodes/TM/ nl carotid upstrokes bilaterally   LUNGS: no acc muscle  use,  Nl contour chest which is clear to A and P bilaterally without cough on insp or exp maneuvers   CV:  RRR  no s3 or murmur or increase in P2, and 1+ sym lower ext pitting edema  ABD:  soft and nontender with nl inspiratory excursion in the supine position. No bruits or organomegaly appreciated, bowel sounds nl  MS:  Nl gait/ ext warm without deformities, calf tenderness, cyanosis or clubbing No obvious joint restrictions   SKIN: warm and dry without lesions    NEURO:  alert, approp, nl sensorium with  no motor or cerebellar deficits apparent.             Labs ordered/ reviewed:      Chemistry      Component Value Date/Time   NA 146 (H) 12/14/2017 0849   K 4.7 12/14/2017 0849   CL 109 (H) 12/14/2017 0849   CO2 24 12/14/2017 0849   BUN 14 12/14/2017 0849   CREATININE 0.85 12/14/2017 0849   CREATININE 0.95 11/15/2016 1013      Component Value Date/Time   CALCIUM 9.5 12/14/2017 0849   ALKPHOS 116 12/13/2017 1142   AST 11 12/13/2017 1142   ALT 12 12/13/2017 1142   BILITOT 0.5 12/13/2017 1142        Lab Results  Component Value Date   WBC 6.4 12/14/2017   HGB 13.5 12/14/2017   HCT 40.4 12/14/2017   MCV 87 12/14/2017   PLT 217 12/14/2017     Lab Results  Component Value Date   DDIMER <0.20 12/14/2017      Lab Results  Component Value Date   TSH 1.020 12/14/2017  Lab Results  Component Value Date   PROBNP 26 12/14/2017                             Assessment & Plan:

## 2018-01-04 NOTE — Assessment & Plan Note (Addendum)
10/25/2017  After extensive coaching inhaler device  effectiveness =    75% Changed advair dpi to symb 80  2bid  Spirometry 10/25/2017  FEV1 1.30 (63%)  Ratio 85 p am advair 250   - FENO 10/25/2017  =   15 - Allergy profile 10/25/2017 >  Eos 0.1 /  IgE  139 RAST Pos dog/dust / ragweed  -  11/30/2017  After extensive coaching inhaler device  effectiveness =    75% > try symb 80 2bid -  PFT's  01/04/2018  FEV1 1.68 (82 % ) ratio 89  p 10 % improvement from saba p sym 80 prior to study with DLCO  78/78 % corrects to 118 % for alv volume    - The proper method of use, as well as anticipated side effects, of a metered-dose inhaler are discussed and demonstrated to the patient.   DDX of  difficult airways management almost all start with A and  include Adherence, Ace Inhibitors, Acid Reflux, Active Sinus Disease, Alpha 1 Antitripsin deficiency, Anxiety masquerading as Airways dz,  ABPA,  Allergy(esp in young), Aspiration (esp in elderly), Adverse effects of meds,  Active smokers, A bunch of PE's (a small clot burden can't cause this syndrome unless there is already severe underlying pulm or vascular dz with poor reserve) plus two Bs  = Bronchiectasis and Beta blocker use..and one C= CHF  Adherence is always the initial "prime suspect" and is a multilayered concern that requires a "trust but verify" approach in every patient - starting with knowing how to use medications, especially inhalers, correctly, keeping up with refills and understanding the fundamental difference between maintenance and prns vs those medications only taken for a very short course and then stopped and not refilled.  - see hfa teaching - return with all meds in hand using a trust but verify approach to confirm accurate Medication  Reconciliation The principal here is that until we are certain that the  patients are doing what we've asked, it makes no sense to ask them to do more.   ? Acid (or non-acid) GERD > always difficult to exclude as  up to 75% of pts in some series report no assoc GI/ Heartburn symptoms> rec continue max (24h)  acid suppression and diet restrictions/ reviewed     ? Allergies/ astham > she does respond some to saba despite maint rx with symbicort but feno not impressive so continue low dose symb here   ? Anxiety > usually at the bottom of this list of usual suspects but should be much higher on this pt's based on H and P and note already on psychotropics and may interfere with adherence and also interpretation of response or lack thereof to symptom management which can be quite subjective.   ? Active sinus dz/ pnds > 1st gen H1 blockers per guidelines    ? Aspiration/ es dysfunction > see st eval 04/06/17 ? Needs esophogram next    I had an extended discussion with the patient reviewing all relevant studies completed to date and  lasting 15 to 20 minutes of a 25 minute visit    See device teaching which extended face to face time for this visit   Each maintenance medication was reviewed in detail including most importantly the difference between maintenance and prns and under what circumstances the prns are to be triggered using an action plan format that is not reflected in the computer generated alphabetically organized AVS.    Please see AVS  for specific instructions unique to this visit that I personally wrote and verbalized to the the pt in detail and then reviewed with pt  by my nurse highlighting any  changes in therapy recommended at today's visit to their plan of care.

## 2018-01-04 NOTE — Patient Instructions (Addendum)
Work on inhaler technique:  relax and gently blow all the way out then take a nice smooth deep breath back in, triggering the inhaler at same time you start breathing in.  Hold for up to 5 seconds if you can. Blow out thru nose. Rinse and gargle with water when done    Please schedule a follow up office visit in 6 weeks, call sooner if needed with all medications /inhalers/ solutions in hand so we can verify exactly what you are taking. This includes all medications from all doctors and over the [place] separate them into two bags:  the ones you take automatically, no matter what, vs the ones you take just when you feel you need them "BAG #2 is UP TO YOU"  - this will really help [place] help you take your medications more effectively.  - consider  allergy profile/ sinus ct and DgEs next  And gabapentin if not tried previously

## 2018-01-06 ENCOUNTER — Encounter: Payer: Self-pay | Admitting: Internal Medicine

## 2018-01-06 NOTE — Assessment & Plan Note (Addendum)
Speech therapy eval 04/06/17 > neg but did have report of globus then  Try off fosfamax 11/30/2017 >>> - 01/04/2018 added 1st gen H1 blockers per guidelines     Upper airway cough syndrome (previously labeled PNDS),  is so named because it's frequently impossible to sort out how much is  CR/sinusitis with freq throat clearing (which can be related to primary GERD)   vs  causing  secondary (" extra esophageal")  GERD from wide swings in gastric pressure that occur with throat clearing, often  promoting self use of mint and menthol lozenges that reduce the lower esophageal sphincter tone and exacerbate the problem further in a cyclical fashion.   These are the same pts (now being labeled as having "irritable larynx syndrome" by some cough centers) who not infrequently have a history of having failed to tolerate ace inhibitors,  dry powder inhalers or biphosphonates or report having atypical/extraesophageal reflux symptoms that don't respond to standard doses of PPI  and are easily confused as having aecopd or asthma flares by even experienced allergists/ pulmonologists (myself included).   For now just add 1st gen H1 blockers per guidelines  And regroup with all meds ? Gabapentin trial then

## 2018-01-09 DIAGNOSIS — M25511 Pain in right shoulder: Secondary | ICD-10-CM | POA: Diagnosis not present

## 2018-01-09 DIAGNOSIS — M25611 Stiffness of right shoulder, not elsewhere classified: Secondary | ICD-10-CM | POA: Diagnosis not present

## 2018-01-09 DIAGNOSIS — R262 Difficulty in walking, not elsewhere classified: Secondary | ICD-10-CM | POA: Diagnosis not present

## 2018-01-09 DIAGNOSIS — R293 Abnormal posture: Secondary | ICD-10-CM | POA: Diagnosis not present

## 2018-01-11 DIAGNOSIS — J42 Unspecified chronic bronchitis: Secondary | ICD-10-CM | POA: Diagnosis not present

## 2018-01-11 DIAGNOSIS — R293 Abnormal posture: Secondary | ICD-10-CM | POA: Diagnosis not present

## 2018-01-11 DIAGNOSIS — M25511 Pain in right shoulder: Secondary | ICD-10-CM | POA: Diagnosis not present

## 2018-01-11 DIAGNOSIS — R262 Difficulty in walking, not elsewhere classified: Secondary | ICD-10-CM | POA: Diagnosis not present

## 2018-01-11 DIAGNOSIS — J45909 Unspecified asthma, uncomplicated: Secondary | ICD-10-CM | POA: Diagnosis not present

## 2018-01-11 DIAGNOSIS — M25611 Stiffness of right shoulder, not elsewhere classified: Secondary | ICD-10-CM | POA: Diagnosis not present

## 2018-01-16 DIAGNOSIS — M25511 Pain in right shoulder: Secondary | ICD-10-CM | POA: Diagnosis not present

## 2018-01-16 DIAGNOSIS — R262 Difficulty in walking, not elsewhere classified: Secondary | ICD-10-CM | POA: Diagnosis not present

## 2018-01-16 DIAGNOSIS — R293 Abnormal posture: Secondary | ICD-10-CM | POA: Diagnosis not present

## 2018-01-16 DIAGNOSIS — M25611 Stiffness of right shoulder, not elsewhere classified: Secondary | ICD-10-CM | POA: Diagnosis not present

## 2018-01-18 DIAGNOSIS — R262 Difficulty in walking, not elsewhere classified: Secondary | ICD-10-CM | POA: Diagnosis not present

## 2018-01-18 DIAGNOSIS — M25511 Pain in right shoulder: Secondary | ICD-10-CM | POA: Diagnosis not present

## 2018-01-18 DIAGNOSIS — M25611 Stiffness of right shoulder, not elsewhere classified: Secondary | ICD-10-CM | POA: Diagnosis not present

## 2018-01-18 DIAGNOSIS — R293 Abnormal posture: Secondary | ICD-10-CM | POA: Diagnosis not present

## 2018-01-22 DIAGNOSIS — R262 Difficulty in walking, not elsewhere classified: Secondary | ICD-10-CM | POA: Diagnosis not present

## 2018-01-22 DIAGNOSIS — R293 Abnormal posture: Secondary | ICD-10-CM | POA: Diagnosis not present

## 2018-01-22 DIAGNOSIS — M25611 Stiffness of right shoulder, not elsewhere classified: Secondary | ICD-10-CM | POA: Diagnosis not present

## 2018-01-22 DIAGNOSIS — M25511 Pain in right shoulder: Secondary | ICD-10-CM | POA: Diagnosis not present

## 2018-01-23 ENCOUNTER — Encounter: Payer: Self-pay | Admitting: *Deleted

## 2018-01-23 DIAGNOSIS — M25511 Pain in right shoulder: Secondary | ICD-10-CM | POA: Diagnosis not present

## 2018-01-23 DIAGNOSIS — M25611 Stiffness of right shoulder, not elsewhere classified: Secondary | ICD-10-CM | POA: Diagnosis not present

## 2018-01-23 DIAGNOSIS — J42 Unspecified chronic bronchitis: Secondary | ICD-10-CM | POA: Diagnosis not present

## 2018-01-23 DIAGNOSIS — J45909 Unspecified asthma, uncomplicated: Secondary | ICD-10-CM | POA: Diagnosis not present

## 2018-01-23 DIAGNOSIS — R293 Abnormal posture: Secondary | ICD-10-CM | POA: Diagnosis not present

## 2018-01-23 DIAGNOSIS — R262 Difficulty in walking, not elsewhere classified: Secondary | ICD-10-CM | POA: Diagnosis not present

## 2018-01-25 DIAGNOSIS — R350 Frequency of micturition: Secondary | ICD-10-CM | POA: Diagnosis not present

## 2018-01-28 ENCOUNTER — Other Ambulatory Visit: Payer: Self-pay | Admitting: *Deleted

## 2018-01-28 DIAGNOSIS — M25611 Stiffness of right shoulder, not elsewhere classified: Secondary | ICD-10-CM | POA: Diagnosis not present

## 2018-01-28 DIAGNOSIS — R293 Abnormal posture: Secondary | ICD-10-CM | POA: Diagnosis not present

## 2018-01-28 DIAGNOSIS — R262 Difficulty in walking, not elsewhere classified: Secondary | ICD-10-CM | POA: Diagnosis not present

## 2018-01-28 DIAGNOSIS — M25511 Pain in right shoulder: Secondary | ICD-10-CM | POA: Diagnosis not present

## 2018-01-28 MED ORDER — BUDESONIDE-FORMOTEROL FUMARATE 80-4.5 MCG/ACT IN AERO: 2.0000 | INHALATION_SPRAY | Freq: Two times a day (BID) | RESPIRATORY_TRACT | 11 refills | Status: AC

## 2018-01-30 DIAGNOSIS — M25511 Pain in right shoulder: Secondary | ICD-10-CM | POA: Diagnosis not present

## 2018-01-30 DIAGNOSIS — M25611 Stiffness of right shoulder, not elsewhere classified: Secondary | ICD-10-CM | POA: Diagnosis not present

## 2018-01-30 DIAGNOSIS — R262 Difficulty in walking, not elsewhere classified: Secondary | ICD-10-CM | POA: Diagnosis not present

## 2018-01-30 DIAGNOSIS — R293 Abnormal posture: Secondary | ICD-10-CM | POA: Diagnosis not present

## 2018-02-01 DIAGNOSIS — M25511 Pain in right shoulder: Secondary | ICD-10-CM | POA: Diagnosis not present

## 2018-02-01 DIAGNOSIS — R293 Abnormal posture: Secondary | ICD-10-CM | POA: Diagnosis not present

## 2018-02-01 DIAGNOSIS — M25611 Stiffness of right shoulder, not elsewhere classified: Secondary | ICD-10-CM | POA: Diagnosis not present

## 2018-02-01 DIAGNOSIS — R262 Difficulty in walking, not elsewhere classified: Secondary | ICD-10-CM | POA: Diagnosis not present

## 2018-02-04 DIAGNOSIS — M25511 Pain in right shoulder: Secondary | ICD-10-CM | POA: Diagnosis not present

## 2018-02-04 DIAGNOSIS — M25611 Stiffness of right shoulder, not elsewhere classified: Secondary | ICD-10-CM | POA: Diagnosis not present

## 2018-02-04 DIAGNOSIS — R293 Abnormal posture: Secondary | ICD-10-CM | POA: Diagnosis not present

## 2018-02-04 DIAGNOSIS — R262 Difficulty in walking, not elsewhere classified: Secondary | ICD-10-CM | POA: Diagnosis not present

## 2018-02-05 DIAGNOSIS — J42 Unspecified chronic bronchitis: Secondary | ICD-10-CM | POA: Diagnosis not present

## 2018-02-05 DIAGNOSIS — J45909 Unspecified asthma, uncomplicated: Secondary | ICD-10-CM | POA: Diagnosis not present

## 2018-02-06 DIAGNOSIS — R293 Abnormal posture: Secondary | ICD-10-CM | POA: Diagnosis not present

## 2018-02-06 DIAGNOSIS — R262 Difficulty in walking, not elsewhere classified: Secondary | ICD-10-CM | POA: Diagnosis not present

## 2018-02-06 DIAGNOSIS — M25611 Stiffness of right shoulder, not elsewhere classified: Secondary | ICD-10-CM | POA: Diagnosis not present

## 2018-02-06 DIAGNOSIS — M25511 Pain in right shoulder: Secondary | ICD-10-CM | POA: Diagnosis not present

## 2018-02-08 DIAGNOSIS — R293 Abnormal posture: Secondary | ICD-10-CM | POA: Diagnosis not present

## 2018-02-08 DIAGNOSIS — M25511 Pain in right shoulder: Secondary | ICD-10-CM | POA: Diagnosis not present

## 2018-02-08 DIAGNOSIS — R262 Difficulty in walking, not elsewhere classified: Secondary | ICD-10-CM | POA: Diagnosis not present

## 2018-02-08 DIAGNOSIS — M25611 Stiffness of right shoulder, not elsewhere classified: Secondary | ICD-10-CM | POA: Diagnosis not present

## 2018-02-13 DIAGNOSIS — R293 Abnormal posture: Secondary | ICD-10-CM | POA: Diagnosis not present

## 2018-02-13 DIAGNOSIS — M25611 Stiffness of right shoulder, not elsewhere classified: Secondary | ICD-10-CM | POA: Diagnosis not present

## 2018-02-13 DIAGNOSIS — R262 Difficulty in walking, not elsewhere classified: Secondary | ICD-10-CM | POA: Diagnosis not present

## 2018-02-13 DIAGNOSIS — M25511 Pain in right shoulder: Secondary | ICD-10-CM | POA: Diagnosis not present

## 2018-02-14 ENCOUNTER — Ambulatory Visit: Payer: Medicare Other | Admitting: Internal Medicine

## 2018-02-15 ENCOUNTER — Ambulatory Visit: Payer: Medicare Other | Admitting: Internal Medicine

## 2018-02-15 DIAGNOSIS — M25611 Stiffness of right shoulder, not elsewhere classified: Secondary | ICD-10-CM | POA: Diagnosis not present

## 2018-02-15 DIAGNOSIS — R262 Difficulty in walking, not elsewhere classified: Secondary | ICD-10-CM | POA: Diagnosis not present

## 2018-02-15 DIAGNOSIS — R293 Abnormal posture: Secondary | ICD-10-CM | POA: Diagnosis not present

## 2018-02-15 DIAGNOSIS — M25511 Pain in right shoulder: Secondary | ICD-10-CM | POA: Diagnosis not present

## 2018-02-18 ENCOUNTER — Encounter: Payer: Self-pay | Admitting: Podiatry

## 2018-02-18 ENCOUNTER — Other Ambulatory Visit: Payer: Self-pay

## 2018-02-18 ENCOUNTER — Ambulatory Visit (INDEPENDENT_AMBULATORY_CARE_PROVIDER_SITE_OTHER): Payer: Medicare Other | Admitting: Podiatry

## 2018-02-18 DIAGNOSIS — Q828 Other specified congenital malformations of skin: Secondary | ICD-10-CM

## 2018-02-18 DIAGNOSIS — R293 Abnormal posture: Secondary | ICD-10-CM | POA: Diagnosis not present

## 2018-02-18 DIAGNOSIS — B351 Tinea unguium: Secondary | ICD-10-CM | POA: Diagnosis not present

## 2018-02-18 DIAGNOSIS — M25611 Stiffness of right shoulder, not elsewhere classified: Secondary | ICD-10-CM | POA: Diagnosis not present

## 2018-02-18 DIAGNOSIS — G894 Chronic pain syndrome: Secondary | ICD-10-CM

## 2018-02-18 DIAGNOSIS — M79674 Pain in right toe(s): Secondary | ICD-10-CM

## 2018-02-18 DIAGNOSIS — M79675 Pain in left toe(s): Secondary | ICD-10-CM | POA: Diagnosis not present

## 2018-02-18 DIAGNOSIS — M25511 Pain in right shoulder: Secondary | ICD-10-CM | POA: Diagnosis not present

## 2018-02-18 DIAGNOSIS — R262 Difficulty in walking, not elsewhere classified: Secondary | ICD-10-CM | POA: Diagnosis not present

## 2018-02-18 NOTE — Progress Notes (Signed)
Subjective: 67 y.o. returns the office today for painful, elongated, thickened toenails which she cannot trim herself and for a painful callus to the left foot when she walks. Denies any redness or drainage around the nails/callus. Denies any acute changes since last appointment and no new complaints today. She states that she has not had any water pills therefore she has had some swelling to both feet. Denies any systemic complaints such as fevers, chills, nausea, vomiting.   PCP: Lucious Groves, DO  Objective: AAO 3, NAD DP/PT pulses palpable, CRT less than 3 seconds Nails hypertrophic, dystrophic, elongated, brittle, discolored 10. There is tenderness overlying the nails 1-5 bilaterally. There is no surrounding erythema or drainage along the nail sites. Hyperkeratotic lesion left submetatarsal 5. No underlying ulceration, drainage, signs of infection.  No open lesions or pre-ulcerative lesions are identified. No other areas of tenderness bilateral lower extremities. No overlying edema, erythema, increased warmth. No pain with calf compression, swelling, warmth, erythema.  Assessment: Patient presents with symptomatic onychomycosis; hyperkeratotic lesion  Plan: -Treatment options including alternatives, risks, complications were discussed -Nails sharply debrided 10 without complication/bleeding. -Hyperkeratotic lesion sharply debrided x 1 without complications or bleeding.  -Discussed daily foot inspection. If there are any changes, to call the office immediately.  -Follow-up in 3 months or sooner if any problems are to arise. In the meantime, encouraged to call the office with any questions, concerns, changes symptoms.  Celesta Gentile, DPM

## 2018-02-18 NOTE — Telephone Encounter (Signed)
morphine (MS CONTIN) 15 MG 12 hr tablet, Refill request @ [facility] on [address].

## 2018-02-19 ENCOUNTER — Ambulatory Visit (INDEPENDENT_AMBULATORY_CARE_PROVIDER_SITE_OTHER)
Admission: RE | Admit: 2018-02-19 | Discharge: 2018-02-19 | Disposition: A | Discharge status: Home / Self Care | Payer: Medicare Other | Source: Ambulatory Visit | Attending: Pulmonary Disease | Admitting: Pulmonary Disease

## 2018-02-19 ENCOUNTER — Ambulatory Visit (INDEPENDENT_AMBULATORY_CARE_PROVIDER_SITE_OTHER): Payer: Medicare Other | Admitting: Pulmonary Disease

## 2018-02-19 ENCOUNTER — Encounter: Payer: Self-pay | Admitting: Pulmonary Disease

## 2018-02-19 VITALS — BP 160/84 | HR 84 | Ht 66.0 in | Wt 306.8 lb

## 2018-02-19 DIAGNOSIS — K219 Gastro-esophageal reflux disease without esophagitis: Secondary | ICD-10-CM

## 2018-02-19 DIAGNOSIS — J45991 Cough variant asthma: Secondary | ICD-10-CM

## 2018-02-19 DIAGNOSIS — R05 Cough: Secondary | ICD-10-CM | POA: Diagnosis not present

## 2018-02-19 MED ORDER — BENZONATATE 200 MG PO CAPS: 200.0000 mg | ORAL_CAPSULE | Freq: Three times a day (TID) | ORAL | 1 refills | Status: DC | PRN

## 2018-02-19 NOTE — Telephone Encounter (Signed)
Will decline refill of MS Contin: per review of the [facility] she has not filled this medication since January, I am not sure why she takes it so infrequently.  Given the inconsistent fill history I would favor discontinuing this long acting opioid medication.  She certainly can be offered a visit this week in our [facility] clinic.  Dr Tomi Likens fills her Zanaflex Rx

## 2018-02-19 NOTE — Progress Notes (Signed)
@Patient  ID: Alisha [name], female    DOB: 1951/06/27, 67 y.o.   MRN: [id number]  Chief Complaint  Patient presents with  . Follow-up    Increased wheezing and dry cough. Using por-air inhaler 3x/day for last 4 days. Neb at night. Would like something for cough and to help her sleep at night.     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  Recent [facility] Encounters:   10/25/2017 1st [facility] office visit/ [facility]   On advair x sev years Chief Complaint  Patient presents with  . Pulmonary consult    Dr. Percival Spanish referred pt for SOB, diagnosed with asthma but SOb with exertion for years, dry cough, acid reflux  at hs tends cough and wakes sev times at night x sev years/ on cpap helps some Feels rested and not symptomatic first thing in am then starts the throat clearing and downhill from there the rest of the day Was on ACEi > d/c 05/02/17 and changed to ARB = losartan rec Stop advair and start symbicort 80 Take 2 puffs first thing in am and then another 2 puffs about 12 hours later. Be sure to continue nexium 40 mg Take 30- 60 min before your first and last meals of the day  Only use your albuterol as a rescue medication GERD diet  Please remember to go to the lab and x-ray department downstairs in the basement  for your tests - we will call you with the results when they are available.  Please schedule a follow up office visit in 4 weeks, sooner if needed  with all medications /inhalers/ solutions in hand so we can  verify exactly what you are taking. This includes all medications from all doctors and over the counters  add needs cxr on return   11/30/2017  f/u ov/[facility] re:  Cough x 1980 ? Cough variant asthma/ uacs / did not bring meds because thinks  "they are as listed" (they are not)  Chief Complaint  Patient presents with  . Follow-up    SOB is worse then last ov    Dyspnea:  Back stops before breathing so uses scooter when goes out Cough: daytime continuous urge to clear throat Sleep: fine on cpap SABA use:  Only uses nebulizer 3 x week - list says she has proair but pt denies though "that list is right"  On fosfamax x years rec Stop fosfamax Plan A = Automatic =  symbicort 80 Take 2 puffs first thing in am and then another 2 puffs about 12 hours later.  Work on Interior and spatial designer:   Plan B = Backup Only use your albuterol nebulizer as a rescue medication to be used if you can't catch your breath by resting or doing a relaxed purse lip breathing pattern.  - The less you use it, the better it will work when you need it. - Ok to use the inhaler up to    every 4 hours if you must but  call for appointment if use goes up over your usual need  GERD es. For drainage / throat tickle try take CHLORPHENIRAMINE  4 mg - take one every 4 hours as needed - available over the counter- may cause drowsiness so start with just a bedtime dose or two and see how you tolerate it before trying in daytime    Please remember to go to the  x-ray department downstairs in the basement  for your tests - we will call you with the results when they are available.    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 Follow-up    PFT's done today. Breathing is unchanged. She is still clearing her throat alot. She is using her albuterol neb 2-3 x per wk.   Dyspnea: scooter for back pain > sob  Cough: constant throat clearing  Sleep:  cough keeps her up despite 2 pillows, not using 1st gen H1 blockers per guidelines   SABA use:   2- 3 x per week but doesn't help cough   No obvious day to day or daytime variability or assoc excess/ purulent sputum or mucus plugs or hemoptysis or cp or chest tightness, subjective wheeze or overt sinus or hb symptoms. No unusual exposure hx or h/o childhood pna/ asthma or knowledge of premature birth.  Also denies any obvious fluctuation of symptoms with weather or environmental changes or other aggravating or alleviating factors except as outlined above       02/19/18 OV - FU  67 year old patient seen in office today for acute visit regarding chronic ongoing dry cough.  Patient managed by Dr. Melvyn Novas.  Patient continuing to report dry cough reporting that symptoms worsened since 02/15/2018 (when patient no showed to Dr. Gustavus Bryant appointment).  Patient reporting that she is been adherent to everything that Dr. Melvyn Novas is talked with her about, continues Nexium, continue Symbicort, continue using rescue inhaler about 2-3 times a day as needed for shortness of breath.  Patient also reports that there was something that Dr. Melvyn Novas was "supposed to give her" but she forgets what it was but knows that it was supposed to be over-the-counter.  Patient is reporting poor diet same as she is eating late at night, fast food, also eating lots of snacks such as jalapeno potato chips.  Patient knows that she probably should be eating better to manage her health.  Of note patient is repeatedly clearing throat throughout the entire interview as well as exam.   Allergies  Allergen Reactions  . Ibuprofen     Other reaction(s): Upset Stomach  . Ampicillin Nausea Only  . Asa [Aspirin] Diarrhea    Other reaction(s): Upset Stomach  . Darvon [Propoxyphene] Nausea And Vomiting  . Demeclocycline Other (See Comments)    Nerves feeling  . Eggs Or Egg-Derived Products Diarrhea  . Lactalbumin Diarrhea  . Lisinopril Cough    . Milk-Related Compounds Diarrhea  . Salicylates Other (See Comments)    Unknown Unknown   . Tetracycline Hcl Hives and Nausea Only  . Tetracyclines & Related Other (See Comments)    Nerves feeling     Immunization History  Administered Date(s) Administered  . Influenza,inj,Quad PF,6+ Mos 05/17/2017  . Influenza-Unspecified 06/03/2010, 06/21/2011, 06/04/2012  . Pneumococcal Polysaccharide-23 06/22/2006, 07/25/2011  . Td 04/21/2001  . Tdap 05/16/2013  . Zoster 06/17/2012    Past Medical History:  Diagnosis Date  .  Arthritis   . Asthma   . Bipolar disorder ([place])   . CAD (coronary artery disease) 07/07/2016   a. s/p stenting x 2 in [place] to the LAD, RCA is chronically occluded, LVEDP was 34  . Cancer (HCC)    cervical  . Depression   . Fibromyalgia   . GERD (gastroesophageal reflux disease)   . History of benign pituitary tumor   . History of kidney stones    1972  . Hyperlipidemia   . Hypertension   . Migraine   . Morbid obesity ([place])   . Peripheral vascular disease ([place])   . Pneumonia   . Seizures ([place])    during surgery for pituitary tumor  . Sleep apnea    with cpap  . Vertigo     Tobacco History: Social History   Tobacco Use  Smoking Status Never Smoker  Smokeless Tobacco Never Used   Counseling given: Not Answered Continue not smoking  Outpatient Encounter Medications as of 02/19/2018  Medication Sig  . albuterol (PROVENTIL) (2.5 MG/3ML) 0.083% nebulizer solution Take 3 mLs (2.5 mg total) by nebulization every 6 (six) hours as needed for wheezing or shortness of breath.  Marland Kitchen albuterol (VENTOLIN HFA) 108 (90 Base) MCG/ACT inhaler Inhale 1 puff into the lungs every 6 (six) hours as needed for wheezing or shortness of breath.  Marland Kitchen amLODipine (NORVASC) 10 MG tablet Take 1 tablet (10 mg total) daily by mouth.  Marland Kitchen aspirin EC 81 MG tablet Take 1 tablet (81 mg total) by mouth daily.  Marland Kitchen atorvastatin (LIPITOR) 20 MG tablet TAKE 1 TABLET BY MOUTH EVERY DAY  .  baclofen (LIORESAL) 10 MG tablet Take 1 tablet (10 mg total) by mouth at bedtime.  . budesonide-formoterol (SYMBICORT) 80-4.5 MCG/ACT inhaler Inhale 2 puffs into the lungs 2 (two) times daily.  . diclofenac sodium (VOLTAREN) 1 % GEL Apply 2 g topically 4 (four) times daily. Rub into affected area of foot 2 to 4 times daily  . DULoxetine (CYMBALTA) 60 MG capsule Take 1 capsule (60 mg total) by mouth daily.  Marland Kitchen esomeprazole (NEXIUM) 40 MG capsule Take 40 mg by mouth 2 (two) times daily before a meal.  . furosemide (LASIX) 20 MG tablet Take 2 tablets (40 mg total) by mouth daily.  . isosorbide dinitrate (ISORDIL) 30 MG tablet Take 30 mg by mouth daily.  Marland Kitchen losartan (COZAAR) 100 MG tablet Take 1 tablet (100 mg total) daily by mouth.  . mirabegron ER (MYRBETRIQ) 25 MG TB24 tablet Take 1 tablet (25 mg total) by mouth daily.  Marland Kitchen morphine (MS CONTIN) 15 MG 12 hr tablet Take 1 tablet (15 mg total) every 12 (twelve) hours by mouth.  . nebivolol (BYSTOLIC) 5 MG tablet Take 1 tablet (5 mg total) by mouth daily.  . nitroGLYCERIN (NITRODUR - DOSED IN MG/24 HR) 0.2 mg/hr patch Apply 1/4th patch to affected shoulder, change daily  . PRESCRIPTION MEDICATION Place 1 application into the left eye at bedtime as needed (rash). Opthalmic ointment  . rizatriptan (MAXALT) 10 MG tablet Take 1 tablet for headache.  May repeat once in 2 hours if needed.  Do not exceed 2 tablets in 24 hours.  . ticagrelor (BRILINTA) 90 MG TABS tablet Take 1 tablet (90 mg total) by mouth 2 (two) times daily.  Marland Kitchen tiZANidine (ZANAFLEX) 4 MG tablet Take 1 tablet (4 mg total) by mouth at bedtime.  . tolterodine (DETROL LA) 4 MG 24 hr capsule Take 4 mg by mouth daily.  . benzonatate (TESSALON)  200 MG capsule Take 1 capsule (200 mg total) by mouth 3 (three) times daily as needed for cough.  . nitroGLYCERIN (NITROSTAT) 0.4 MG SL tablet Place 1 tablet (0.4 mg total) under the tongue every 5 (five) minutes as needed for chest pain.   No  facility-administered encounter medications on file as of 02/19/2018.      Review of Systems  Constitutional: +fatigue  No  weight loss, night sweats,  fevers, chills HEENT:  +nasal congestion, sneezing, post nasal drip  No headaches,  Difficulty swallowing,  Tooth/dental problems, or  Sore throat, No , itching, ear ache CV: +LE swelling bilaterally No chest pain,  orthopnea,  anasarca, dizziness, palpitations, syncope  GI: +occasional indigestion No abdominal pain, nausea, vomiting, diarrhea, change in bowel habits, loss of appetite, bloody stools Resp: +dry cough, No shortness of breath with exertion or at rest.  No excess mucus, no productive cough,  No  No coughing up of blood.  No change in color of mucus.  No wheezing.  No chest wall deformity Skin: no rash, lesions, no skin changes. GU: no dysuria, change in color of urine, no urgency or frequency.  No flank pain, no hematuria  MS:  No joint pain or swelling.  No decreased range of motion.  No back pain. Psych:  No change in mood or affect. No depression or anxiety.  No memory loss.   Physical Exam  BP (!) 160/84   Pulse 84   Ht 5\' 6"  (1.676 m)   Wt (!) 306 lb 12.8 oz (139.2 kg)   SpO2 99%   BMI 49.52 kg/m   Wt Readings from Last 3 Encounters:  02/19/18 (!) 306 lb 12.8 oz (139.2 kg)  01/04/18 299 lb (135.6 kg)  12/19/17 293 lb (132.9 kg)  >>> Patient has been without Lasix for 2 weeks.  Patient working with insurance company to obtain more Lasix.  Patient reports that internal medicine is aware.   GEN: A/Ox3; pleasant , NAD, well nourished    HEENT:  [place]/AT,  EACs-clear, TMs-wnl, NOSE-clear, THROAT- +post nasal drip, Sinus - frontal tenderness R>L clear   NECK:  Supple w/ fair ROM; no JVD; normal carotid impulses w/o bruits; no thyromegaly or nodules palpated; no lymphadenopathy.    RESP:  Clear  P & A; w/o, wheezes/ rales/ or rhonchi. no accessory muscle use, no dullness to percussion  CARD:  +3 plus edema LE  bilaterally RRR, no m/r/g, pulses intact, no cyanosis or clubbing.  GI:   Soft & nt; nml bowel sounds; no organomegaly or masses detected.   Musco: Warm bil, no deformities or joint swelling noted.   Neuro: alert, no focal deficits noted.    Skin: Warm, no lesions or rashes    Lab Results:  CBC    Component Value Date/Time   WBC 6.4 12/14/2017 0849   WBC 8.3 10/25/2017 1716   RBC 4.65 12/14/2017 0849   RBC 4.72 10/25/2017 1716   HGB 13.5 12/14/2017 0849   HCT 40.4 12/14/2017 0849   PLT 217 12/14/2017 0849   MCV 87 12/14/2017 0849   MCH 29.0 12/14/2017 0849   MCH 27.7 12/13/2016 1758   MCHC 33.4 12/14/2017 0849   MCHC 33.4 10/25/2017 1716   RDW 14.4 12/14/2017 0849   LYMPHSABS 3.0 10/25/2017 1716   MONOABS 0.6 10/25/2017 1716   EOSABS 0.2 10/25/2017 1716   BASOSABS 0.1 10/25/2017 1716    BMET    Component Value Date/Time   NA 146 (H) 12/14/2017 0272  K 4.7 12/14/2017 0849   CL 109 (H) 12/14/2017 0849   CO2 24 12/14/2017 0849   GLUCOSE 110 (H) 12/14/2017 0849   GLUCOSE 100 (H) 10/25/2017 1716   BUN 14 12/14/2017 0849   CREATININE 0.85 12/14/2017 0849   CREATININE 0.95 11/15/2016 1013   CALCIUM 9.5 12/14/2017 0849   GFRNONAA 71 12/14/2017 0849   GFRAA 81 12/14/2017 0849    BNP No results found for: BNP  ProBNP    Component Value Date/Time   PROBNP 26 12/14/2017 0849   PROBNP 20.0 10/25/2017 1716    Imaging: No results found.   Assessment & Plan:   Pleasant 67 year old patient seen office today.  Extensively discussed with patient cough management.  Patient to get started on daily antihistamine as well as Chlortab.  Will also provide Tessalon Perles to help with cough.  Patient encouraged to use over-the-counter cough medicine such as Delsym or Mucinex to help with cough management.    Chest x-ray today.  Emphasized extensively for patient to stop clearing her throat.  I suspect her GERD is also contributing to this this patient reports poor  diet, snacking on jalapeno potato chips, fast food, eating late at night.    Also unfortunately patient has been without her Lasix for 2 weeks.  She reports she misplaced it and family has a who lives far away.  Patient did not notify internal medicine until 2 days ago they are aware and are currently working to get patient additional Lasix.  Patient to see internal medicine on 02/21/2018.  Follow-up in 6 weeks to see Dr. Melvyn Novas.  Emphasized the importance with patient to keep appointments and arrive on time.  It is very difficult for [place] to manage her care and manage her health concerns if you do not keep follow-up appointments.  Cough variant asthma vs UACS Continue Symbicort Continue rescue inhaler as needed Aggressive GERD management with Nexium twice daily as well as diet management Daily antihistamine use ordered Chlortab's Tessalon Perles  May need sinus CT if symptoms persist  Morbid obesity due to excess calories ([place]) Continue to work towards healthy weight Discussed GERD and diet management extensively  Gastroesophageal reflux disease without esophagitis Continue Nexium GERD diet extensively discussed with patient Handout for diet provided to patient      Lauraine Rinne, NP 02/19/2018

## 2018-02-19 NOTE — Assessment & Plan Note (Signed)
Continue Symbicort Continue rescue inhaler as needed Aggressive GERD management with Nexium twice daily as well as diet management Daily antihistamine use ordered Chlortab's Tessalon Perles  May need sinus CT if symptoms persist

## 2018-02-19 NOTE — Assessment & Plan Note (Signed)
Continue to work towards healthy weight Discussed GERD and diet management extensively

## 2018-02-19 NOTE — Patient Instructions (Addendum)
Chest Xray today  Internal Med on 02/21/18 >>>call about lasix dose with primary care >>> notify them but your weight increase   Cough:  Alisha [name] We believe you have a chronic/cyclical cough that is aggravated by reflux , coughing , and drainage.  . Goal is to not Cough or clear throat.  Alisha [name] Avoid coughing or clearing throat by using:  o non-mint products/sugarless candy o Water o ice chips o Remember NO MINT PRODUCTS o Stop cinnamon cough drops  . Medications to use:  o Mucinex DM 1-2 every 12 hrs or Delsym 2 tsp every 12 hrs f or cough o Tessalon Three times a day  As needed  Cough.   o Nexium 40mg  two times a day  o Zyrtec 10mg  at bedtime o Chlor tabs 4mg  2 at bedtime  for nasal drip until cough is 100% cough free.     Follow-up with Dr. Melvyn Novas in 6 weeks.  If symptoms persist we may need to move forward with sinus CT  It is imperative that you eat appropriate food choices for management of your GERD.  Eating jalapeno potato chips is not a good selection.    Please contact the office if your symptoms worsen or you have concerns that you are not improving.   Thank you for choosing [facility] for your healthcare, and for allowing [place] to partner with you on your healthcare journey. I am thankful to be able to provide care to you today.   Wyn Quaker FNP-C         Food Choices for Gastroesophageal Reflux Disease, Adult When you have gastroesophageal reflux disease (GERD), the foods you eat and your eating habits are very important. Choosing the right foods can help ease your discomfort. What guidelines do I need to follow?  Choose fruits, vegetables, whole grains, and low-fat dairy products.  Choose low-fat meat, fish, and poultry.  Limit fats such as oils, salad dressings, butter, nuts, and avocado.  Keep a food diary. This helps you identify foods that cause symptoms.  Avoid foods that cause symptoms. These may be different for everyone.  Eat small meals  often instead of 3 large meals a day.  Eat your meals slowly, in a place where you are relaxed.  Limit fried foods.  Cook foods using methods other than frying.  Avoid drinking alcohol.  Avoid drinking large amounts of liquids with your meals.  Avoid bending over or lying down until 2-3 hours after eating. What foods are not recommended? These are some foods and drinks that may make your symptoms worse: Vegetables Tomatoes. Tomato juice. Tomato and spaghetti sauce. Chili peppers. Onion and garlic. Horseradish. Fruits Oranges, grapefruit, and lemon (fruit and juice). Meats High-fat meats, fish, and poultry. This includes hot dogs, ribs, ham, sausage, salami, and bacon. Dairy Whole milk and chocolate milk. Sour cream. Cream. Butter. Ice cream. Cream cheese. Drinks Coffee and tea. Bubbly (carbonated) drinks or energy drinks. Condiments Hot sauce. Barbecue sauce. Sweets/Desserts Chocolate and cocoa. Donuts. Peppermint and spearmint. Fats and Oils High-fat foods. This includes [place] fries and potato chips. Other Vinegar. Strong spices. This includes black pepper, white pepper, red pepper, cayenne, curry powder, cloves, ginger, and chili powder. The items listed above may not be a complete list of foods and drinks to avoid. Contact your dietitian for more information. This information is not intended to replace advice given to you by your health care provider. Make sure you discuss any questions you have with your health care provider.  Document Released: 02/13/2012 Document Revised: 01/20/2016 Document Reviewed: 06/18/2013 Elsevier Interactive Patient Education  2017 [employer].

## 2018-02-19 NOTE — Progress Notes (Signed)
Your chest x-ray results have come back.  Showing no acute findings.  No changes in your plan of care at this time.  Keep follow-up.  Wyn Quaker, FNP

## 2018-02-19 NOTE — Assessment & Plan Note (Signed)
Continue Nexium GERD diet extensively discussed with patient Handout for diet provided to patient

## 2018-02-20 ENCOUNTER — Ambulatory Visit (INDEPENDENT_AMBULATORY_CARE_PROVIDER_SITE_OTHER): Payer: Medicare Other | Admitting: Family Medicine

## 2018-02-20 DIAGNOSIS — R262 Difficulty in walking, not elsewhere classified: Secondary | ICD-10-CM | POA: Diagnosis not present

## 2018-02-20 DIAGNOSIS — M25511 Pain in right shoulder: Secondary | ICD-10-CM | POA: Diagnosis not present

## 2018-02-20 DIAGNOSIS — G8929 Other chronic pain: Secondary | ICD-10-CM | POA: Diagnosis not present

## 2018-02-20 DIAGNOSIS — M25611 Stiffness of right shoulder, not elsewhere classified: Secondary | ICD-10-CM | POA: Diagnosis not present

## 2018-02-20 DIAGNOSIS — R293 Abnormal posture: Secondary | ICD-10-CM | POA: Diagnosis not present

## 2018-02-20 NOTE — Patient Instructions (Signed)
You're doing great! Keep up the good work in therapy and doing the home exercises. Transition to home exercises at the discretion of your therapist. Follow up with me in 6 weeks or as needed if you're doing well.

## 2018-02-20 NOTE — Telephone Encounter (Signed)
Patient has appt with PCP tomorrow. Will discuss request for MS Contin at that time. Pt aware to request tizanidine refill from Dr. Tomi Likens. Hubbard Hartshorn, RN, BSN

## 2018-02-20 NOTE — Progress Notes (Signed)
Chart and office note reviewed in detail  > agree with a/p as outlined    

## 2018-02-21 ENCOUNTER — Encounter: Payer: Self-pay | Admitting: Internal Medicine

## 2018-02-21 ENCOUNTER — Ambulatory Visit (INDEPENDENT_AMBULATORY_CARE_PROVIDER_SITE_OTHER): Payer: Medicare Other | Admitting: Internal Medicine

## 2018-02-21 ENCOUNTER — Encounter: Payer: Self-pay | Admitting: Family Medicine

## 2018-02-21 ENCOUNTER — Other Ambulatory Visit: Payer: Self-pay

## 2018-02-21 VITALS — BP 156/91 | HR 65 | Temp 98.0°F | Ht 66.0 in | Wt 307.6 lb

## 2018-02-21 DIAGNOSIS — Z79899 Other long term (current) drug therapy: Secondary | ICD-10-CM

## 2018-02-21 DIAGNOSIS — Z79891 Long term (current) use of opiate analgesic: Secondary | ICD-10-CM

## 2018-02-21 DIAGNOSIS — M25562 Pain in left knee: Secondary | ICD-10-CM | POA: Diagnosis not present

## 2018-02-21 DIAGNOSIS — M545 Low back pain: Secondary | ICD-10-CM

## 2018-02-21 DIAGNOSIS — G894 Chronic pain syndrome: Secondary | ICD-10-CM | POA: Diagnosis not present

## 2018-02-21 DIAGNOSIS — Z23 Encounter for immunization: Secondary | ICD-10-CM | POA: Diagnosis not present

## 2018-02-21 DIAGNOSIS — M25561 Pain in right knee: Secondary | ICD-10-CM

## 2018-02-21 DIAGNOSIS — M542 Cervicalgia: Secondary | ICD-10-CM

## 2018-02-21 DIAGNOSIS — I10 Essential (primary) hypertension: Secondary | ICD-10-CM

## 2018-02-21 DIAGNOSIS — M25511 Pain in right shoulder: Secondary | ICD-10-CM

## 2018-02-21 DIAGNOSIS — F319 Bipolar disorder, unspecified: Secondary | ICD-10-CM

## 2018-02-21 MED ORDER — MORPHINE SULFATE ER 15 MG PO TBCR: 15.0000 mg | EXTENDED_RELEASE_TABLET | Freq: Every morning | ORAL | 0 refills | Status: DC

## 2018-02-21 MED ORDER — OXYCODONE-ACETAMINOPHEN 5-325 MG PO TABS: 1.0000 | ORAL_TABLET | Freq: Every day | ORAL | 0 refills | Status: AC | PRN

## 2018-02-21 MED ORDER — FUROSEMIDE 20 MG PO TABS: 20.0000 mg | ORAL_TABLET | Freq: Every day | ORAL | 0 refills | Status: DC

## 2018-02-21 MED ORDER — OXYCODONE-ACETAMINOPHEN 5-325 MG PO TABS: 1.0000 | ORAL_TABLET | Freq: Every day | ORAL | 0 refills | Status: DC | PRN

## 2018-02-21 MED ORDER — MORPHINE SULFATE ER 15 MG PO TBCR
15.0000 mg | EXTENDED_RELEASE_TABLET | Freq: Every morning | ORAL | 0 refills | Status: AC
Start: 2018-02-21 — End: 2018-03-23

## 2018-02-21 NOTE — Assessment & Plan Note (Signed)
secondary to partial-thickness rotator cuff tears with impingement.  She is doing much better following second subacromial injection with physical therapy and home exercises.  She was encouraged to keep up with the therapy and home exercises but transition to a home exercise program at the discretion of the therapist.  She will follow-up with me in 6 weeks or as needed.

## 2018-02-21 NOTE — Progress Notes (Signed)
PCP: Lucious Groves, DO  Subjective:   HPI: Patient is a 67 y.o. female here for right shoulder, arm pain.  1/16: Patient reports she's had right shoulder and elbow pain dating back several months. Believes this may have started in June to July 2018 when she had several falls. Pain is sharp lateral right shoulder and medial right elbow. Associated spasms of these areas. Difficulty getting comfortable. + night pain. Worse with reaching. Prior issues with right rotator cuff and told this was 'going bad' No skin changes, numbness.  3/20: Patient reports her elbow feels completely better. She states she still having pain in her lateral right shoulder though that is worse at nighttime it can radiate down the arm at times. She gets spasms anterolateral shoulder that radiates to the back as well. Tolerating the nitroglycerin patches without any problems. Has not started physical therapy yet. States that the subacromial injection only helped her that day. No skin changes, numbness.  4/24: Patient returns with continued pain in her lateral right shoulder. Pain level 7/10 and sharp. She is starting PT on this coming Monday. Using nitro patches 1/2 patch and changing daily. No new injuries. No skin changes.  6/26: Patient reports that she is doing much better. She states she is doing physical therapy about 3 times a week including has recently started aqua therapy.  Exline she can move her right arm much better. Does not take her as long to wash herself in the shower. Pain she does get that she describes more as a soreness now. She is not taking any medications for her shoulder. No new injuries. No skin changes.  Past Medical History:  Diagnosis Date  . Arthritis   . Asthma   . Bipolar disorder ([facility])   . CAD (coronary artery disease) 07/07/2016   a. s/p stenting x 2 in [place] to the LAD, RCA is chronically occluded, LVEDP was 34  . Cancer (HCC)    cervical  . Depression   .  Fibromyalgia   . GERD (gastroesophageal reflux disease)   . History of benign pituitary tumor   . History of kidney stones    1972  . Hyperlipidemia   . Hypertension   . Migraine   . Morbid obesity ([facility])   . Peripheral vascular disease ([facility])   . Pneumonia   . Seizures ([facility])    during surgery for pituitary tumor  . Sleep apnea    with cpap  . Vertigo     Current Outpatient Medications on File Prior to Visit  Medication Sig Dispense Refill  . albuterol (PROVENTIL) (2.5 MG/3ML) 0.083% nebulizer solution Take 3 mLs (2.5 mg total) by nebulization every 6 (six) hours as needed for wheezing or shortness of breath. 150 mL 1  . albuterol (VENTOLIN HFA) 108 (90 Base) MCG/ACT inhaler Inhale 1 puff into the lungs every 6 (six) hours as needed for wheezing or shortness of breath.    Marland Kitchen amLODipine (NORVASC) 10 MG tablet Take 1 tablet (10 mg total) daily by mouth. 90 tablet 2  . aspirin EC 81 MG tablet Take 1 tablet (81 mg total) by mouth daily. 90 tablet 3  . atorvastatin (LIPITOR) 20 MG tablet TAKE 1 TABLET BY MOUTH EVERY DAY 90 tablet 3  . baclofen (LIORESAL) 10 MG tablet Take 1 tablet (10 mg total) by mouth at bedtime. 30 each 2  . benzonatate (TESSALON) 200 MG capsule Take 1 capsule (200 mg total) by mouth 3 (three) times daily as needed for cough.  30 capsule 1  . budesonide-formoterol (SYMBICORT) 80-4.5 MCG/ACT inhaler Inhale 2 puffs into the lungs 2 (two) times daily. 1 Inhaler 11  . diclofenac sodium (VOLTAREN) 1 % GEL Apply 2 g topically 4 (four) times daily. Rub into affected area of foot 2 to 4 times daily 100 g 2  . DULoxetine (CYMBALTA) 60 MG capsule Take 1 capsule (60 mg total) by mouth daily. 90 capsule 3  . esomeprazole (NEXIUM) 40 MG capsule Take 40 mg by mouth 2 (two) times daily before a meal.    . furosemide (LASIX) 20 MG tablet Take 2 tablets (40 mg total) by mouth daily. 180 tablet 3  . isosorbide dinitrate (ISORDIL) 30 MG tablet Take 30 mg by mouth daily.    Marland Kitchen losartan  (COZAAR) 100 MG tablet Take 1 tablet (100 mg total) daily by mouth. 90 tablet 0  . mirabegron ER (MYRBETRIQ) 25 MG TB24 tablet Take 1 tablet (25 mg total) by mouth daily. 90 tablet 3  . morphine (MS CONTIN) 15 MG 12 hr tablet Take 1 tablet (15 mg total) every 12 (twelve) hours by mouth. 60 tablet 0  . nebivolol (BYSTOLIC) 5 MG tablet Take 1 tablet (5 mg total) by mouth daily. 90 tablet 3  . nitroGLYCERIN (NITRODUR - DOSED IN MG/24 HR) 0.2 mg/hr patch Apply 1/4th patch to affected shoulder, change daily 30 patch 1  . nitroGLYCERIN (NITROSTAT) 0.4 MG SL tablet Place 1 tablet (0.4 mg total) under the tongue every 5 (five) minutes as needed for chest pain. 25 tablet 3  . PRESCRIPTION MEDICATION Place 1 application into the left eye at bedtime as needed (rash). Opthalmic ointment    . rizatriptan (MAXALT) 10 MG tablet Take 1 tablet for headache.  May repeat once in 2 hours if needed.  Do not exceed 2 tablets in 24 hours. 10 tablet 3  . ticagrelor (BRILINTA) 90 MG TABS tablet Take 1 tablet (90 mg total) by mouth 2 (two) times daily. 180 tablet 2  . tiZANidine (ZANAFLEX) 4 MG tablet Take 1 tablet (4 mg total) by mouth at bedtime. 30 tablet 3  . tolterodine (DETROL LA) 4 MG 24 hr capsule Take 4 mg by mouth daily.     No current facility-administered medications on file prior to visit.     Past Surgical History:  Procedure Laterality Date  . ANKLE FUSION Bilateral   . carpel tunnel release Bilateral 2017  . CORONARY ANGIOPLASTY    . ESOPHAGOGASTRODUODENOSCOPY (EGD) WITH PROPOFOL N/A 06/14/2017   Procedure: ESOPHAGOGASTRODUODENOSCOPY (EGD) WITH PROPOFOL;  Surgeon: Wilford Corner, MD;  Location: [facility];  Service: Endoscopy;  Laterality: N/A;  . EXCISION / CURETTAGE BONE CYST PHALANGES OF FOOT  2014   Removal of foot cyst   . PARTIAL HYSTERECTOMY  unknown   Patient still has ovaries  . pituitary tumor removal  1999   removed as much as they could, on optic nerve  . TONSILLECTOMY    . TOTAL  KNEE ARTHROPLASTY     TKR X 3  2 on the left and 1 on the right    Allergies  Allergen Reactions  . Ibuprofen     Other reaction(s): Upset Stomach  . Ampicillin Nausea Only  . Asa [Aspirin] Diarrhea    Other reaction(s): Upset Stomach  . Darvon [Propoxyphene] Nausea And Vomiting  . Demeclocycline Other (See Comments)    Nerves feeling  . Eggs Or Egg-Derived Products Diarrhea  . Lactalbumin Diarrhea  . Lisinopril Cough  . Milk-Related Compounds Diarrhea  .  Salicylates Other (See Comments)    Unknown Unknown   . Tetracycline Hcl Hives and Nausea Only  . Tetracyclines & Related Other (See Comments)    Nerves feeling     Social History   Socioeconomic History  . Marital status: Divorced    Spouse name: Not on file  . Number of children: Not on file  . Years of education: Not on file  . Highest education level: Not on file  Occupational History  . Not on file  Social Needs  . Financial resource strain: Not on file  . Food insecurity:    Worry: Not on file    Inability: Not on file  . Transportation needs:    Medical: Not on file    Non-medical: Not on file  Tobacco Use  . Smoking status: Never Smoker  . Smokeless tobacco: Never Used  Substance and Sexual Activity  . Alcohol use: Yes    Comment: Socially. [name].  . Drug use: No  . Sexual activity: Never  Lifestyle  . Physical activity:    Days per week: Not on file    Minutes per session: Not on file  . Stress: Not on file  Relationships  . Social connections:    Talks on phone: Not on file    Gets together: Not on file    Attends religious service: Not on file    Active member of club or organization: Not on file    Attends meetings of clubs or organizations: Not on file    Relationship status: Not on file  . Intimate partner violence:    Fear of current or ex partner: Not on file    Emotionally abused: Not on file    Physically abused: Not on file    Forced sexual activity: Not on file   Other Topics Concern  . Not on file  Social History Narrative   Current Social History        Who lives at home: Lives with youngest daughter, "Olivia Mackie" 09/25/2016    Transportation: Public 8/85/0277   Important Relationships & Pets: 2 daughters (52, 45), 1 son (17), 10 grandchildren, 4 great grandchildren 09/25/2016    Current Stressors: Recently moved from [place], [place] due to threat of NH placement 09/25/2016   Religious / Personal Beliefs: Christian 09/25/2016   Interests / Fun: Dancing 09/25/2016   Other: Participates in [employer] 09/25/2016    Family History  Problem Relation Age of Onset  . Heart disease Mother 74       CABG, pacemaker, valve  . Cancer Father   . Heart disease Sister        Fluid around the heart  . Breast cancer Neg Hx     BP 140/72   Ht 5\' 6"  (1.676 m)   Wt (!) 306 lb (138.8 kg)   BMI 49.39 kg/m   Review of Systems: See HPI above.     Objective:  Physical Exam:  Gen: NAD, comfortable in exam room  Right shoulder: No swelling, ecchymoses.  No gross deformity. Mild diffuse tenderness. ROM 60 degrees ER, 150 flexion and abduction. Negative Hawkins, Neers. Negative Yergasons. Strength 5/5 with empty can and resisted internal/external rotation.  Minimal pain with empty can. NV intact distally.  Assessment & Plan:  1. Right shoulder pain -secondary to partial-thickness rotator cuff tears with impingement.  She is doing much better following second subacromial injection with physical therapy and home exercises.  She was encouraged to keep up with the therapy  and home exercises but transition to a home exercise program at the discretion of the therapist.  She will follow-up with me in 6 weeks or as needed.

## 2018-02-21 NOTE — Patient Instructions (Addendum)
Continue to follow up with your other physicians, Make sure you bring all of your medications to each visit. I will have you continue the MSContin at 1 tablet a day in the morning You may take a Percocet 5-325 in the afternoon if needed.  If you do not need these medications do not take them.  I will see you in about 6-7 weeks.

## 2018-02-22 DIAGNOSIS — R293 Abnormal posture: Secondary | ICD-10-CM | POA: Diagnosis not present

## 2018-02-22 DIAGNOSIS — R262 Difficulty in walking, not elsewhere classified: Secondary | ICD-10-CM | POA: Diagnosis not present

## 2018-02-22 DIAGNOSIS — M25611 Stiffness of right shoulder, not elsewhere classified: Secondary | ICD-10-CM | POA: Diagnosis not present

## 2018-02-22 DIAGNOSIS — M25511 Pain in right shoulder: Secondary | ICD-10-CM | POA: Diagnosis not present

## 2018-02-22 NOTE — Assessment & Plan Note (Signed)
HPI: denies depression, mood appears stable.  She does not see psychiatry (referred last year by Dr Marlowe Sax)  A: Bipolar depression  P; Stable continue to monitor

## 2018-02-22 NOTE — Progress Notes (Signed)
Subjective:  HPI: [name] is a 67 y.o. female who presents for follow up on chronic right shoulder, back and knee pain.  Yesterday I received a refill request for her MS Contin refill.  I declined this request as she has not needed the amount of morphine that I had previously prescribed.  Previously noted in the chart she came to me from another provider last year who had concerns that she did not take controlled opioid medications appropriately.  I provided her with 2 months of a reduced prescription back in November.  When I reviewed the trauma controlled prescribing system it appears that she feels that second prescription in January.  To my recollection as well as review of her chart she has not been prescribed any additional opioids by myself for her multiple providers during that time.  I do not see any evidence of other prescriptions in her chart.  We prescribe these medications for her chronic low back pain she also has chronic pains in her right shoulder and bilateral knees.  She has been seeing sports medicine and she is received 2 injections into her right shoulder she is also doing physical therapy for her right shoulder and has just become aquatic therapy.  She thinks that her shoulder is doing much better and she demonstrates a increased range of motion in her right shoulder.  In asking her about her opioid medications it is a bit less clear exactly what she is taking.  She reports to me that she is taking MS Contin 1 tablet in the morning every day she also reports that this helps control her pain she denies any excessive sedation and she thinks this is helping her complete physical therapy.  She does report that in the evening and at night sometimes she is in increased pain and she would like to have a second dose of MS Contin for this pain.  Please see Assessment and Plan below for the status of her chronic medical problems.  Review of Systems: Review of Systems    Constitutional: Positive for malaise/fatigue. Negative for fever and weight loss.  Cardiovascular: Positive for leg swelling (ran out of lasix, granddaughter spilled and lost down sink). Negative for chest pain.  Gastrointestinal: Negative for abdominal pain and constipation.  Musculoskeletal: Positive for back pain, joint pain and neck pain. Negative for falls.  Neurological: Negative for dizziness.    Objective:  Physical Exam: Vitals:   02/21/18 0900  BP: (!) 156/91  Pulse: 65  Temp: 98 F (36.7 C)  TempSrc: Oral  SpO2: 99%  Weight: (!) 307 lb 9.6 oz (139.5 kg)  Height: 5\' 6"  (1.676 m)   Physical Exam  Constitutional: She appears well-developed and well-nourished.  Has power scooter and cain with her  Cardiovascular: Normal rate and regular rhythm.  Pulmonary/Chest: Effort normal and breath sounds normal.  Abdominal: Soft. Bowel sounds are normal.  obese  Musculoskeletal: She exhibits edema (1+ bilateral ankle edema).       Right shoulder: She exhibits no tenderness and no effusion.       Left shoulder: She exhibits no tenderness and no effusion.       Right knee: She exhibits no swelling and no effusion. Tenderness found. Medial joint line tenderness noted.       Left knee: She exhibits no swelling and no effusion. Tenderness found. Medial joint line tenderness noted.  Nursing note and vitals reviewed.  Assessment & Plan:  Essential hypertension HPI: Did not bring medications, reviewed medications,  thinks she is taking all but lasix- grandaughter spilled bottle in skin. She cannot get refill through insurance for 3 weeks, has had increased leg swelling since being out for the last week  A: Essential HTN- not at goal  P: Still concerned about adherence. -Continue bystolic 5mg  daily - Continue lasix 40mg  daily -Provided a script for patient to fill outside insurance -Continue amlodipine 10mg  daily -Continue losartan 100mg  daily -CPAP  Bipolar depression (HCC) HPI:  denies depression, mood appears stable.  She does not see psychiatry (referred last year by Dr Marlowe Sax)  A: Bipolar depression  P; Stable continue to monitor  Chronic pain syndrome A: Chronic pain syndrome- on opioid medications  P:  As noted back in April I have some concerns about her use of opioid medications, her previous UDS was appropriate but her reported use does not seem to match with Rx provided or NCSRS, Thus I am not concerned as much for abuse or diversion simply that her MCI has actually lead her to less use.  I discussed that I think we may be able to stop Opioid medications however she does report she is continuing to take them.  Thus we discussed the following plan -Will provide refill of MSCotin 15mg  to take once daily in the morning.  I have provided a 2 month supply of this -Provide Percocet 5-325 #30pills to take once daily in afternoon as needed. -She will follow up in 6-7 weeks with me BEFORE these medications run out.  She will bring in both perscription bottles for pill counts.  Will adjust further pain medications based on this.   Medications Ordered Meds ordered this encounter  Medications  . morphine (MS CONTIN) 15 MG 12 hr tablet    Sig: Take 1 tablet (15 mg total) by mouth every morning.    Dispense:  30 tablet    Refill:  0    Rx 1/2  . oxyCODONE-acetaminophen (PERCOCET) 5-325 MG tablet    Sig: Take 1 tablet by mouth daily as needed for severe pain.    Dispense:  30 tablet    Refill:  0  . morphine (MS CONTIN) 15 MG 12 hr tablet    Sig: Take 1 tablet (15 mg total) by mouth every morning.    Dispense:  30 tablet    Refill:  0    Rx 2/2  . oxyCODONE-acetaminophen (PERCOCET) 5-325 MG tablet    Sig: Take 1 tablet by mouth daily as needed for severe pain.    Dispense:  30 tablet    Refill:  0    Rx 2/2  . furosemide (LASIX) 20 MG tablet    Sig: Take 1 tablet (20 mg total) by mouth daily.    Dispense:  30 tablet    Refill:  0   Other  Orders Orders Placed This Encounter  Procedures  . Pneumococcal conjugate vaccine 13-valent   Follow Up: Return in about 7 weeks (around 04/11/2018).

## 2018-02-22 NOTE — Assessment & Plan Note (Addendum)
HPI: Did not bring medications, reviewed medications, thinks she is taking all but lasix- grandaughter spilled bottle in skin. She cannot get refill through insurance for 3 weeks, has had increased leg swelling since being out for the last week  A: Essential HTN- not at goal  P: Still concerned about adherence. -Continue bystolic 5mg  daily - Continue lasix 40mg  daily -Provided a script for patient to fill outside insurance -Continue amlodipine 10mg  daily -Continue losartan 100mg  daily -CPAP

## 2018-02-22 NOTE — Assessment & Plan Note (Signed)
A: Chronic pain syndrome- on opioid medications  P:  As noted back in April I have some concerns about her use of opioid medications, her previous UDS was appropriate but her reported use does not seem to match with Rx provided or NCSRS, Thus I am not concerned as much for abuse or diversion simply that her MCI has actually lead her to less use.  I discussed that I think we may be able to stop Opioid medications however she does report she is continuing to take them.  Thus we discussed the following plan -Will provide refill of MSCotin 15mg  to take once daily in the morning.  I have provided a 2 month supply of this -Provide Percocet 5-325 #30pills to take once daily in afternoon as needed. -She will follow up in 6-7 weeks with me BEFORE these medications run out.  She will bring in both perscription bottles for pill counts.  Will adjust further pain medications based on this.

## 2018-02-23 DIAGNOSIS — J45909 Unspecified asthma, uncomplicated: Secondary | ICD-10-CM | POA: Diagnosis not present

## 2018-02-23 DIAGNOSIS — J42 Unspecified chronic bronchitis: Secondary | ICD-10-CM | POA: Diagnosis not present

## 2018-02-27 DIAGNOSIS — R293 Abnormal posture: Secondary | ICD-10-CM | POA: Diagnosis not present

## 2018-02-27 DIAGNOSIS — M25511 Pain in right shoulder: Secondary | ICD-10-CM | POA: Diagnosis not present

## 2018-02-27 DIAGNOSIS — M25611 Stiffness of right shoulder, not elsewhere classified: Secondary | ICD-10-CM | POA: Diagnosis not present

## 2018-02-27 DIAGNOSIS — R262 Difficulty in walking, not elsewhere classified: Secondary | ICD-10-CM | POA: Diagnosis not present

## 2018-03-01 ENCOUNTER — Telehealth: Payer: Self-pay | Admitting: Neurology

## 2018-03-01 DIAGNOSIS — M25611 Stiffness of right shoulder, not elsewhere classified: Secondary | ICD-10-CM | POA: Diagnosis not present

## 2018-03-01 DIAGNOSIS — R262 Difficulty in walking, not elsewhere classified: Secondary | ICD-10-CM | POA: Diagnosis not present

## 2018-03-01 DIAGNOSIS — R293 Abnormal posture: Secondary | ICD-10-CM | POA: Diagnosis not present

## 2018-03-01 DIAGNOSIS — M25511 Pain in right shoulder: Secondary | ICD-10-CM | POA: Diagnosis not present

## 2018-03-01 NOTE — Telephone Encounter (Signed)
Patient states she needs a refill of medication tizanidine refilled at [facility]. Pt scheduled for yearly follow up on 8.14.19.

## 2018-03-04 MED ORDER — TIZANIDINE HCL 4 MG PO TABS: 4.0000 mg | ORAL_TABLET | Freq: Four times a day (QID) | ORAL | 0 refills | Status: DC | PRN

## 2018-03-06 DIAGNOSIS — M25611 Stiffness of right shoulder, not elsewhere classified: Secondary | ICD-10-CM | POA: Diagnosis not present

## 2018-03-06 DIAGNOSIS — R262 Difficulty in walking, not elsewhere classified: Secondary | ICD-10-CM | POA: Diagnosis not present

## 2018-03-06 DIAGNOSIS — J42 Unspecified chronic bronchitis: Secondary | ICD-10-CM | POA: Diagnosis not present

## 2018-03-06 DIAGNOSIS — R293 Abnormal posture: Secondary | ICD-10-CM | POA: Diagnosis not present

## 2018-03-06 DIAGNOSIS — M25511 Pain in right shoulder: Secondary | ICD-10-CM | POA: Diagnosis not present

## 2018-03-06 DIAGNOSIS — J45909 Unspecified asthma, uncomplicated: Secondary | ICD-10-CM | POA: Diagnosis not present

## 2018-03-08 DIAGNOSIS — R350 Frequency of micturition: Secondary | ICD-10-CM | POA: Diagnosis not present

## 2018-03-11 DIAGNOSIS — M25511 Pain in right shoulder: Secondary | ICD-10-CM | POA: Diagnosis not present

## 2018-03-11 DIAGNOSIS — M25611 Stiffness of right shoulder, not elsewhere classified: Secondary | ICD-10-CM | POA: Diagnosis not present

## 2018-03-11 DIAGNOSIS — R262 Difficulty in walking, not elsewhere classified: Secondary | ICD-10-CM | POA: Diagnosis not present

## 2018-03-11 DIAGNOSIS — R293 Abnormal posture: Secondary | ICD-10-CM | POA: Diagnosis not present

## 2018-03-15 ENCOUNTER — Telehealth: Payer: Self-pay | Admitting: *Deleted

## 2018-03-15 ENCOUNTER — Telehealth: Payer: Self-pay

## 2018-03-15 ENCOUNTER — Ambulatory Visit (INDEPENDENT_AMBULATORY_CARE_PROVIDER_SITE_OTHER): Payer: Medicare Other | Admitting: Cardiology

## 2018-03-15 ENCOUNTER — Encounter: Payer: Self-pay | Admitting: Cardiology

## 2018-03-15 VITALS — BP 148/89 | HR 58 | Ht 66.0 in | Wt 311.0 lb

## 2018-03-15 DIAGNOSIS — M25511 Pain in right shoulder: Secondary | ICD-10-CM | POA: Diagnosis not present

## 2018-03-15 DIAGNOSIS — G4733 Obstructive sleep apnea (adult) (pediatric): Secondary | ICD-10-CM

## 2018-03-15 DIAGNOSIS — R293 Abnormal posture: Secondary | ICD-10-CM | POA: Diagnosis not present

## 2018-03-15 DIAGNOSIS — R262 Difficulty in walking, not elsewhere classified: Secondary | ICD-10-CM | POA: Diagnosis not present

## 2018-03-15 DIAGNOSIS — I10 Essential (primary) hypertension: Secondary | ICD-10-CM | POA: Diagnosis not present

## 2018-03-15 DIAGNOSIS — M25611 Stiffness of right shoulder, not elsewhere classified: Secondary | ICD-10-CM | POA: Diagnosis not present

## 2018-03-15 NOTE — Telephone Encounter (Signed)
ERROR
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
## 2018-03-15 NOTE — Telephone Encounter (Signed)
-----   Message from Cleon Gustin, RN sent at 03/15/2018  9:25 AM EDT ----- Regarding: Split Night Sleep Study Dr. Radford Pax patient needs Split Night Sleep Study for OSA. ESS= 12 and documented in Epic. Please pre-cert and schedule patient. Thanks

## 2018-03-15 NOTE — Patient Instructions (Signed)
Medication Instructions:  Your physician recommends that you continue on your current medications as directed. Please refer to the Current Medication list given to you today.   Labwork: None ordered  Testing/Procedures: Your physician has recommended that you have a sleep study. This test records several body functions during sleep, including: brain activity, eye movement, oxygen and carbon dioxide blood levels, heart rate and rhythm, breathing rate and rhythm, the flow of air through your mouth and nose, snoring, body muscle movements, and chest and belly movement.  Follow-Up: Your physician wants you to follow-up with Dr. Radford Pax AS NEEDED  Your physician recommends that you schedule a follow-up appointment with your Primary Care Physician NEXT WEEK   Any Other Special Instructions Will Be Listed Below (If Applicable).     If you need a refill on your cardiac medications before your next appointment, please call your pharmacy.

## 2018-03-18 ENCOUNTER — Telehealth: Payer: Self-pay | Admitting: *Deleted

## 2018-03-18 DIAGNOSIS — M25511 Pain in right shoulder: Secondary | ICD-10-CM | POA: Diagnosis not present

## 2018-03-18 DIAGNOSIS — M25611 Stiffness of right shoulder, not elsewhere classified: Secondary | ICD-10-CM | POA: Diagnosis not present

## 2018-03-18 DIAGNOSIS — R262 Difficulty in walking, not elsewhere classified: Secondary | ICD-10-CM | POA: Diagnosis not present

## 2018-03-18 DIAGNOSIS — R293 Abnormal posture: Secondary | ICD-10-CM | POA: Diagnosis not present

## 2018-03-18 NOTE — Telephone Encounter (Signed)
-----   Message from Cleon Gustin, RN sent at 03/15/2018  9:25 AM EDT ----- Regarding: Split Night Sleep Study Dr. Radford Pax patient needs Split Night Sleep Study for OSA. ESS= 12 and documented in Epic. Please pre-cert and schedule patient. Thanks

## 2018-03-18 NOTE — Telephone Encounter (Signed)
Staff message sent to Gae Bon ok to schedule sleep study. Per [facility] web portal no PA is required. Decision ID# [id number].

## 2018-03-19 ENCOUNTER — Other Ambulatory Visit: Payer: Self-pay

## 2018-03-21 NOTE — Telephone Encounter (Signed)
Patient is scheduled for lab study on 04/16/18. Patient understands her sleep study will be done at [facility] sleep lab. Patient understands she will receive a sleep packet in a week or so. Patient understands to call if she does not receive the sleep packet in a timely manner. Patient agrees with treatment and thanked me for call Left detailed message on voicemail with date and time of titration and informed patient to call back to confirm or reschedule.

## 2018-03-25 DIAGNOSIS — R262 Difficulty in walking, not elsewhere classified: Secondary | ICD-10-CM | POA: Diagnosis not present

## 2018-03-25 DIAGNOSIS — M25611 Stiffness of right shoulder, not elsewhere classified: Secondary | ICD-10-CM | POA: Diagnosis not present

## 2018-03-25 DIAGNOSIS — M25511 Pain in right shoulder: Secondary | ICD-10-CM | POA: Diagnosis not present

## 2018-03-25 DIAGNOSIS — R293 Abnormal posture: Secondary | ICD-10-CM | POA: Diagnosis not present

## 2018-03-25 DIAGNOSIS — J42 Unspecified chronic bronchitis: Secondary | ICD-10-CM | POA: Diagnosis not present

## 2018-03-25 DIAGNOSIS — J45909 Unspecified asthma, uncomplicated: Secondary | ICD-10-CM | POA: Diagnosis not present

## 2018-03-26 ENCOUNTER — Encounter: Payer: Self-pay | Admitting: Neurology

## 2018-03-27 DIAGNOSIS — M25611 Stiffness of right shoulder, not elsewhere classified: Secondary | ICD-10-CM | POA: Diagnosis not present

## 2018-03-27 DIAGNOSIS — R293 Abnormal posture: Secondary | ICD-10-CM | POA: Diagnosis not present

## 2018-03-27 DIAGNOSIS — M25511 Pain in right shoulder: Secondary | ICD-10-CM | POA: Diagnosis not present

## 2018-03-27 DIAGNOSIS — R262 Difficulty in walking, not elsewhere classified: Secondary | ICD-10-CM | POA: Diagnosis not present

## 2018-03-29 DIAGNOSIS — R262 Difficulty in walking, not elsewhere classified: Secondary | ICD-10-CM | POA: Diagnosis not present

## 2018-03-29 DIAGNOSIS — M25611 Stiffness of right shoulder, not elsewhere classified: Secondary | ICD-10-CM | POA: Diagnosis not present

## 2018-03-29 DIAGNOSIS — R293 Abnormal posture: Secondary | ICD-10-CM | POA: Diagnosis not present

## 2018-03-29 DIAGNOSIS — M25511 Pain in right shoulder: Secondary | ICD-10-CM | POA: Diagnosis not present

## 2018-04-01 DIAGNOSIS — M25511 Pain in right shoulder: Secondary | ICD-10-CM | POA: Diagnosis not present

## 2018-04-01 DIAGNOSIS — R293 Abnormal posture: Secondary | ICD-10-CM | POA: Diagnosis not present

## 2018-04-01 DIAGNOSIS — R262 Difficulty in walking, not elsewhere classified: Secondary | ICD-10-CM | POA: Diagnosis not present

## 2018-04-01 DIAGNOSIS — M25611 Stiffness of right shoulder, not elsewhere classified: Secondary | ICD-10-CM | POA: Diagnosis not present

## 2018-04-02 ENCOUNTER — Encounter: Payer: Self-pay | Admitting: Internal Medicine

## 2018-04-02 ENCOUNTER — Ambulatory Visit (INDEPENDENT_AMBULATORY_CARE_PROVIDER_SITE_OTHER): Payer: Medicare Other | Admitting: Internal Medicine

## 2018-04-02 ENCOUNTER — Other Ambulatory Visit: Payer: Self-pay

## 2018-04-02 VITALS — BP 140/70 | HR 57 | Ht 66.0 in | Wt 312.0 lb

## 2018-04-02 DIAGNOSIS — R059 Cough, unspecified: Secondary | ICD-10-CM

## 2018-04-02 DIAGNOSIS — J45991 Cough variant asthma: Secondary | ICD-10-CM

## 2018-04-02 DIAGNOSIS — R05 Cough: Secondary | ICD-10-CM

## 2018-04-02 DIAGNOSIS — R058 Other specified cough: Secondary | ICD-10-CM

## 2018-04-02 MED ORDER — GABAPENTIN 100 MG PO CAPS: 100.0000 mg | ORAL_CAPSULE | Freq: Three times a day (TID) | ORAL | 2 refills | Status: DC

## 2018-04-02 NOTE — Progress Notes (Signed)
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    History of Present Illness  10/25/2017 1st [facility] office visit/ Alisha [name]   On advair x sev years Chief Complaint  Patient presents with  . Pulmonary consult    Dr. Percival Spanish referred pt for SOB, diagnosed with asthma but SOb with exertion for years, dry cough, acid reflux  at hs tends cough and wakes sev times at night x sev years/ on cpap helps some Feels rested and not symptomatic first thing in am then starts the throat clearing and downhill from there the rest of the day Was on ACEi > d/c 05/02/17 and changed to ARB = losartan rec Stop advair and start symbicort 80 Take 2 puffs first thing in am and then another 2 puffs about 12 hours later. Be sure to continue nexium 40 mg Take 30- 60 min before your first and last meals of the day  Only use your albuterol as a rescue medication GERD diet  Please remember to go to the lab and x-ray department downstairs in the basement  for your tests - we will call you with the results when they are available.  Please schedule a follow up office visit in 4 weeks, sooner if needed  with all medications /inhalers/ solutions in hand so we can verify exactly what you are taking. This includes all medications from all doctors and over the counters  add needs cxr on return   11/30/2017  f/u ov/Alisha [name] re:  Cough x 1980 ? Cough variant asthma/ uacs / did not bring meds because thinks   "they are as listed" (they are not)  Chief Complaint  Patient presents with  . Follow-up    SOB is worse then last ov    Dyspnea:  Back stops before breathing so uses scooter when goes out Cough: daytime continuous urge to clear throat Sleep: fine on cpap SABA use:  Only uses nebulizer 3 x week - list says she has proair but pt denies though "that list is right"  On fosfamax x years rec Stop fosfamax Plan A = Automatic =  symbicort 80 Take 2 puffs first thing in am and then another 2 puffs about 12 hours later.  Work on Interior and spatial designer:   Plan B = Backup Only use your albuterol nebulizer as a rescue medication to be used if you can't catch your breath by resting or doing a relaxed purse lip breathing pattern.  - The less you use it, the better it will work when you need it. - Ok to use the inhaler up to    every 4 hours if you must but call for appointment if use goes up over your usual need  GERD es. For drainage / throat tickle try take CHLORPHENIRAMINE  4 mg - take one every 4 hours as needed - available over the counter- may cause drowsiness so start  with just a bedtime dose or two and see how you tolerate it before trying in daytime    Please remember to go to the  x-ray department downstairs in the basement  for your tests - we will call you with the results when they are available.    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 Follow-up    PFT's done today. Breathing is unchanged. She is still clearing her throat alot. She is using her albuterol neb 2-3 x per wk.   Dyspnea: scooter for back pain > sob  Cough: constant throat clearing  Sleep: cough keeps her up despite 2 pillows, not using 1st gen H1 blockers per guidelines   SABA use:   2- 3 x per week but doesn't help cough  rec Work on inhaler technique:  relax and gently blow all the way out then take a nice smooth deep  breath back in, triggering the inhaler at same time you start breathing in.  Hold for up to 5 seconds if you can. Blow out thru nose. Rinse and gargle with water when done    Please schedule a follow up office visit in 6 weeks, call sooner if needed with all medications /inhalers/ solutions in hand so we can verify exactly what you are taking. This includes all medications from all doctors and over the [facility] separate them into two bags:  the ones you take automatically, no matter what, vs the ones you take just when you feel you need them "BAG #2 is UP TO YOU"  - this will really help [place] help you take your medications more effectively.  - consider  allergy profile/ sinus ct and DgEs next  And gabapentin if not tried previously     02/19/18 NP/ Alisha [name]  rec See Internal Med on 02/21/18 >>>call about lasix dose with primary care >>> notify them but your weight increase   Cough:  Alisha [name] Kitchen We believe you have a chronic/cyclical cough that is aggravated by reflux , coughing , and drainage.  . Goal is to not Cough or clear throat.  Alisha [name] Kitchen Avoid coughing or clearing throat by using:  o non-mint products/sugarless candy o Water o ice chips o Remember NO MINT PRODUCTS o Stop cinnamon cough drops  . Medications to use:  o Mucinex DM 1-2 every 12 hrs or Delsym 2 tsp every 12 hrs f or cough o Tessalon Three times a day  As needed  Cough.   o Nexium 40mg  two times a day  o Zyrtec 10mg  at bedtime o Chlor tabs 4mg  2 at bedtime  for nasal drip until cough is 100% cough free.         04/02/2018  f/u ov/Alisha [name] re:   uacs vs asthma  Chief Complaint  Patient presents with  . Follow-up    Breathing has improved some. She states she uses her albuterol inhaler once daily on average and neb about 2 x per wk.  Dyspnea:  Worse one week prior to OV  Then better s obvious pattern  Cough: globus sensation has been a constant daytime c/o since ? "for a minute" (never responded specifically)   SABA use: once  daily  02: none    No obvious patterns in  day to day or daytime variability or assoc excess/ purulent sputum or mucus plugs or hemoptysis or cp or chest tightness, subjective wheeze  or overt sinus or hb symptoms.   Sleeping: on side / 2 pillows on cpap does fine  without nocturnal  or early am exacerbation  of respiratory  c/o's or need for noct saba. Also denies any obvious fluctuation of symptoms with weather or environmental changes or other aggravating or alleviating factors except as outlined above   No unusual exposure hx or h/o childhood pna/ asthma or knowledge of premature birth.  Current Allergies, Complete Past Medical History, Past Surgical History, Family History, and Social History were reviewed in [employer] record.  ROS  The following are not active complaints unless bolded Hoarseness, sore throat, dysphagia, dental problems, itching, sneezing,  nasal congestion or discharge of excess mucus or purulent secretions, ear ache,   fever, chills, sweats, unintended wt loss or wt gain, classically pleuritic or exertional cp,  orthopnea pnd or arm/hand swelling  or leg swelling, presyncope, palpitations, abdominal pain, anorexia, nausea, vomiting, diarrhea  or change in bowel habits or change in bladder habits, change in stools or change in urine, dysuria, hematuria,  rash, arthralgias, visual complaints, headache, numbness, weakness or ataxia or problems with walking or coordination,  change in mood or  memory.        Current Meds  Medication Sig  . albuterol (PROVENTIL) (2.5 MG/3ML) 0.083% nebulizer solution Take 3 mLs (2.5 mg total) by nebulization every 6 (six) hours as needed for wheezing or shortness of breath.  Alisha [name] Kitchen albuterol (VENTOLIN HFA) 108 (90 Base) MCG/ACT inhaler Inhale 1 puff into the lungs every 6 (six) hours as needed for wheezing or shortness of breath.  Alisha [name] Kitchen amLODipine (NORVASC) 10 MG tablet Take 1 tablet (10 mg total) daily by mouth.  Alisha [name] Kitchen aspirin EC  81 MG tablet Take 1 tablet (81 mg total) by mouth daily.  Alisha [name] Kitchen atorvastatin (LIPITOR) 20 MG tablet TAKE 1 TABLET BY MOUTH EVERY DAY  . baclofen (LIORESAL) 10 MG tablet Take 1 tablet (10 mg total) by mouth at bedtime.  . budesonide-formoterol (SYMBICORT) 80-4.5 MCG/ACT inhaler Inhale 2 puffs into the lungs 2 (two) times daily.  . diclofenac sodium (VOLTAREN) 1 % GEL Apply 2 g topically 4 (four) times daily. Rub into affected area of foot 2 to 4 times daily  . DULoxetine (CYMBALTA) 60 MG capsule Take 1 capsule (60 mg total) by mouth daily.  Alisha [name] Kitchen esomeprazole (NEXIUM) 40 MG capsule Take 40 mg by mouth 2 (two) times daily before a meal.  . furosemide (LASIX) 20 MG tablet Take 2 tablets (40 mg total) by mouth daily.  . isosorbide dinitrate (ISORDIL) 30 MG tablet Take 30 mg by mouth daily.  Alisha [name] Kitchen losartan (COZAAR) 100 MG tablet Take 1 tablet (100 mg total) daily by mouth.  . mirabegron ER (MYRBETRIQ) 50 MG TB24 tablet Take 50 mg by mouth daily.  . nebivolol (BYSTOLIC) 5 MG tablet Take 1 tablet (5 mg total) by mouth daily.  . nitroGLYCERIN (NITRODUR - DOSED IN MG/24 HR) 0.2 mg/hr patch Apply 1/4th patch to affected shoulder, change daily  . nitroGLYCERIN (NITROSTAT) 0.4 MG SL tablet Place 1 tablet (0.4 mg total) under the tongue every 5 (five) minutes as needed for chest pain.  Alisha [name] Kitchen PRESCRIPTION MEDICATION Place 1 application into the left eye at bedtime as needed (rash). Opthalmic ointment  . rizatriptan (MAXALT) 10 MG tablet Take 1 tablet for headache.  May repeat once in 2 hours if needed.  Do not exceed 2 tablets in 24 hours.  . ticagrelor (BRILINTA) 90 MG TABS tablet Take 1 tablet (  90 mg total) by mouth 2 (two) times daily.  Alisha [name] Kitchen tiZANidine (ZANAFLEX) 4 MG tablet Take 1 tablet (4 mg total) by mouth at bedtime.  . tolterodine (DETROL LA) 4 MG 24 hr capsule Take 4 mg by mouth daily.                          Objective:   Physical Exam  Obese hoarse bf nad/ freq throat clearing    04/02/2018          312  01/04/2018        299  11/30/2017          297   10/25/17 295 lb (133.8 kg)  10/08/17 292 lb (132.5 kg)  09/28/17 290 lb 3.2 oz (131.6 kg)     Vital signs reviewed - Note on arrival 02 sats  98% on RA       HEENT: nl   turbinates bilaterally, and oropharynx. Nl external ear canals without cough reflex- top denture    NECK :  without JVD/Nodes/TM/ nl carotid upstrokes bilaterally   LUNGS: no acc muscle use,  Nl contour chest which is clear to A and P bilaterally without cough on insp or exp maneuvers   CV:  RRR  no s3 or murmur or increase in P2, and trace to 1+ sym lower ext pitting edema    ABD:  Obese / soft and nontender with nl inspiratory excursion in the supine position. No bruits or organomegaly appreciated, bowel sounds nl  MS:  Nl gait/ ext warm without deformities, calf tenderness, cyanosis or clubbing No obvious joint restrictions   SKIN: warm and dry without lesions    NEURO:  alert, approp, nl sensorium with  no motor or cerebellar deficits apparent.             I personally reviewed images and agree with radiology impression as follows:  CXR:   02/19/18  No active cardiopulmonary disease.      Assessment & Plan:

## 2018-04-02 NOTE — Patient Instructions (Signed)
Start gabapentin 100 mg three times daily   Please see patient coordinator before you leave today  to schedule sinus ct and asthma challenge test   Hold symbicort x 24 hours prior to the asthma challenge and albuterol for at least 6 hours prior    See [name] NP in 4  weeks with all your medications, even over the counter meds, separated in two separate bags, the ones you take no matter what vs the ones you stop once you feel better and take only as needed when you feel you need them.   [name]  will generate for you a new user friendly medication calendar that will put [place] all on the same page re: your medication use.     Without this process, it simply isn't possible to assure that we are providing  your outpatient care  with  the attention to detail we feel you deserve.   If we cannot assure that you're getting that kind of care,  then we cannot manage your problem effectively from this clinic.  Once you have seen [name] and we are sure that we're all on the same page with your medication use she will arrange follow up with me.

## 2018-04-02 NOTE — Patient Outreach (Signed)
[facility]) Care Management  04/02/2018  Allena Pietila Aug 23, 1951 [id number]  TELEPHONE SCREENING Referral date: 03/19/18 Referral source: EMMI Campaign Referral reason: Emmi engagement score Insurance: [employer] health care Attempt #1  Telephone call to patient regarding EMMI campaign referral. HIPAA verified with patient.  Explained reason for call.  Discussed and offered [facility] care management services. Patient states she is currently at work and would like to call back to have a private conversation with RNCM . RNCM gave patient contact phone number.  PLAN: RNCM will await return call from patient. If no return call RNCM will attempt 2nd telephone outreach to patient within 4 business days.  RNCM will send outreach letter.   Quinn Plowman RN,BSN,CCM [facility] Telephonic  [phone]

## 2018-04-03 ENCOUNTER — Ambulatory Visit (INDEPENDENT_AMBULATORY_CARE_PROVIDER_SITE_OTHER)
Admission: RE | Admit: 2018-04-03 | Discharge: 2018-04-03 | Disposition: A | Discharge status: Home / Self Care | Payer: Medicare Other | Source: Ambulatory Visit | Attending: Internal Medicine | Admitting: Internal Medicine

## 2018-04-03 ENCOUNTER — Encounter: Payer: Self-pay | Admitting: Internal Medicine

## 2018-04-03 DIAGNOSIS — R293 Abnormal posture: Secondary | ICD-10-CM | POA: Diagnosis not present

## 2018-04-03 DIAGNOSIS — R05 Cough: Secondary | ICD-10-CM

## 2018-04-03 DIAGNOSIS — M25611 Stiffness of right shoulder, not elsewhere classified: Secondary | ICD-10-CM | POA: Diagnosis not present

## 2018-04-03 DIAGNOSIS — M25511 Pain in right shoulder: Secondary | ICD-10-CM | POA: Diagnosis not present

## 2018-04-03 DIAGNOSIS — R059 Cough, unspecified: Secondary | ICD-10-CM

## 2018-04-03 DIAGNOSIS — R262 Difficulty in walking, not elsewhere classified: Secondary | ICD-10-CM | POA: Diagnosis not present

## 2018-04-03 DIAGNOSIS — R058 Other specified cough: Secondary | ICD-10-CM

## 2018-04-03 NOTE — Progress Notes (Signed)
Spoke with pt and notified of results per Dr. Wert. Pt verbalized understanding and denied any questions. 

## 2018-04-03 NOTE — Assessment & Plan Note (Signed)
Speech therapy eval 04/06/17 > neg but did have report of globus then  Try off fosfamax 11/30/2017 >>> - 01/04/2018 added 1st gen H1 blockers per guidelines  - Sinus CT / methacholine challenged ordered 04/02/2018  - gabapentin trial 04/02/2018     Lack of cough resolution on a verified empirical regimen could mean an alternative diagnosis (irritable larynx syndrome and not asthma ) , persistence of the disease state (eg sinusitis or bronchiectasis) , or inadequacy of currently available therapy (eg no medical rx available for non-acid gerd)    Next steps as above / no change in rx in meantime

## 2018-04-03 NOTE — Assessment & Plan Note (Signed)
Body mass index is 50.36 kg/m.  -  trending up  Lab Results  Component Value Date   TSH 1.020 12/14/2017     Contributing to gerd risk/ doe/reviewed the need and the process to achieve and maintain neg calorie balance > defer f/u primary care including intermittently monitoring thyroid status

## 2018-04-03 NOTE — Assessment & Plan Note (Signed)
10/25/2017  After extensive coaching inhaler device  effectiveness =    75% Changed advair dpi to symb 80  2bid  Spirometry 10/25/2017  FEV1 1.30 (63%)  Ratio 85 p am advair 250   - FENO 10/25/2017  =   15 - Allergy profile 10/25/2017 >  Eos 0.1 /  IgE  139 RAST Pos dog/dust / ragweed  -  11/30/2017    try symb 80 2bid -  PFT's  01/04/2018  FEV1 1.68 (82 % ) ratio 89  p 10 % improvement from saba p sym 80 prior to study with DLCO  78/78 % corrects to 118 % for alv volume    -  04/02/2018  After extensive coaching inhaler device  effectiveness =    75% (short Ti)    Since not clear how much if any of her ongoing symptoms have anything to do with asthma the next step is mct off symbicort  X 24 h and just use saba prn up to 6 h before study  Discussed in detail all the  indications, usual  risks and alternatives  relative to the benefits with patient who agrees to proceed with w/u as outlined.      I had an extended discussion with the patient reviewing all relevant studies completed to date and  lasting 15 to 20 minutes of a 25 minute visit    See device teaching which extended face to face time for this visit.  Each maintenance medication was reviewed in detail including emphasizing most importantly the difference between maintenance and prns and under what circumstances the prns are to be triggered using an action plan format that is not reflected in the computer generated alphabetically organized AVS which I have not found useful in most complex patients, especially with respiratory illnesses  Please see AVS for specific instructions unique to this visit that I personally wrote and verbalized to the the pt in detail and then reviewed with pt  by my nurse highlighting any  changes in therapy recommended at today's visit to their plan of care.

## 2018-04-05 ENCOUNTER — Other Ambulatory Visit: Payer: Self-pay

## 2018-04-05 DIAGNOSIS — M25611 Stiffness of right shoulder, not elsewhere classified: Secondary | ICD-10-CM | POA: Diagnosis not present

## 2018-04-05 DIAGNOSIS — M25511 Pain in right shoulder: Secondary | ICD-10-CM | POA: Diagnosis not present

## 2018-04-05 DIAGNOSIS — R262 Difficulty in walking, not elsewhere classified: Secondary | ICD-10-CM | POA: Diagnosis not present

## 2018-04-05 DIAGNOSIS — R293 Abnormal posture: Secondary | ICD-10-CM | POA: Diagnosis not present

## 2018-04-05 NOTE — Patient Outreach (Signed)
[name]) Care Management  04/05/2018  Alisha [name] 1951/02/08 [id number]   TELEPHONE SCREENING Referral date: 03/19/18 Referral source: EMMI Campaign Referral reason: Emmi engagement score Insurance: [employer] health care Attempt #2  Telephone call to patient regarding EMMI campaign referral. HIPAA verified with patient.  Patient states she is unable to talk at this time. HIPAA compliant message left with call back phone number.   PLAN: RNCM will attempt 3rd telephone call to patient within 4 business days.    Alisha Plowman RN,BSN,CCM [name] Telephonic  [phone]

## 2018-04-08 DIAGNOSIS — M25611 Stiffness of right shoulder, not elsewhere classified: Secondary | ICD-10-CM | POA: Diagnosis not present

## 2018-04-08 DIAGNOSIS — R293 Abnormal posture: Secondary | ICD-10-CM | POA: Diagnosis not present

## 2018-04-08 DIAGNOSIS — R262 Difficulty in walking, not elsewhere classified: Secondary | ICD-10-CM | POA: Diagnosis not present

## 2018-04-08 DIAGNOSIS — M25511 Pain in right shoulder: Secondary | ICD-10-CM | POA: Diagnosis not present

## 2018-04-09 NOTE — Progress Notes (Signed)
NEUROLOGY FOLLOW UP OFFICE NOTE  Alisha [name] [id number]  HISTORY OF PRESENT ILLNESS: Alisha [name] is a 67 year old right-handed female with asthma, CAD, OSA, fibromyalgia, HTN and depression who follows up for migraines and pituitary adenoma.  UPDATE: She has not been seen since last September.   Intensity:  Moderate to severe Duration:  Several hours Frequency:  3 to 4 days a week Current NSAIDS:  no Current analgesics:  MS Contin (for chronic pain), Percocet (rarely for chronic pain) Current triptans:  no Current ergotamine:  no Current anti-emetic:  no Current muscle relaxants:  Tizanidine 4mg  at bedtime.  Sometimes she takes 8mg  Current anti-anxiolytic:  no Current sleep aide:  no Current Antihypertensive medications:  Toprol, Lasix, lisinopril Current Antidepressant medications:  Cymbalta 60mg  Current Anticonvulsant medications:  no Current anti-CGRP:  no Current Vitamins/Herbal/Supplements:  no Current Antihistamines/Decongestants:  Claritin Other therapy:  no  Depression:  No.  Anxiety:  No Other pain:  Chronic pain  To further evaluate right temporal headache and right sided vision loss, sed rate from August 2018 was 24.  She was referred to ophthalmology for visual field testing.  She was evaluated by Dr. Kathlen Mody at [facility] in September 2018.  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  She did not follow up with me as scheduled.  HISTORY: In the mid-1990s, she began experiencing headaches.  She reported difficulty with seeing the eye chart on physical exams.  In 1999, she was found to have peripheral vision loss by an eye doctor.  She had an MRI of the brain which revealed a pituitary mass that was compressing the optic nerve.  Biopsy confirmed it to be benign.  She underwent 3 surgeries, including transphenoidal surgery.   She underwent radiation in 2014.  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  She would periodically have repeat MRI of brain performed and regular visual field testing.  MRI of brain and pituitary with and without contrast from 11/13/16 was personally reviewed and revealed chronic post-surgical right frontal and temporal encephalomalcia and previous resection of pituitary mass without residual or recurrent tumor.     She has migraine headaches.  They are "15"/10 intensity, pounding right-sided or bi-temporal and occipital headache, associated with nausea, photophobia and phonophobia.  They last 1 to 2 days and occur about 4 or 5 times a year.  Stress is a trigger.  Maxalt relieves it.  If Maxalt doesn't work, she goes to the ED.  She also has dull daily headaches as well.   She also has history of vertigo and falls.   She has fibromyalgia and depression, and takes multiple medications, including chronic opioid use.   Past medication:  Amitriptyline (side effects), sertraline 25mg , amlodipine, rizatriptan 10mg  (ineffective and aggravating), Tylenol, naproxen, ibuprofen   PAST MEDICAL HISTORY: Past Medical History:  Diagnosis Date  . Arthritis   . Asthma   . Bipolar disorder ([place])   . CAD (coronary artery disease) 07/07/2016   a. s/p stenting x 2 in [place] to the LAD, RCA is chronically occluded, LVEDP was 34  . Cancer (HCC)    cervical  . Depression   . Fibromyalgia   . GERD (gastroesophageal reflux disease)   . History of benign pituitary tumor   . History of kidney stones    1972  . Hyperlipidemia   . Hypertension   . Migraine   .  Morbid obesity ([place])   . Peripheral vascular disease ([place])   . Pneumonia   . Seizures ([place])    during surgery for pituitary tumor  . Sleep apnea    with cpap  . Vertigo     MEDICATIONS: Current Outpatient Medications on File Prior to Visit  Medication Sig Dispense Refill  . albuterol (PROVENTIL) (2.5 MG/3ML) 0.083% nebulizer  solution Take 3 mLs (2.5 mg total) by nebulization every 6 (six) hours as needed for wheezing or shortness of breath. 150 mL 1  . albuterol (VENTOLIN HFA) 108 (90 Base) MCG/ACT inhaler Inhale 1 puff into the lungs every 6 (six) hours as needed for wheezing or shortness of breath.    Marland Kitchen amLODipine (NORVASC) 10 MG tablet Take 1 tablet (10 mg total) daily by mouth. 90 tablet 2  . aspirin EC 81 MG tablet Take 1 tablet (81 mg total) by mouth daily. 90 tablet 3  . atorvastatin (LIPITOR) 20 MG tablet TAKE 1 TABLET BY MOUTH EVERY DAY 90 tablet 3  . baclofen (LIORESAL) 10 MG tablet Take 1 tablet (10 mg total) by mouth at bedtime. 30 each 2  . budesonide-formoterol (SYMBICORT) 80-4.5 MCG/ACT inhaler Inhale 2 puffs into the lungs 2 (two) times daily. 1 Inhaler 11  . diclofenac sodium (VOLTAREN) 1 % GEL Apply 2 g topically 4 (four) times daily. Rub into affected area of foot 2 to 4 times daily 100 g 2  . DULoxetine (CYMBALTA) 60 MG capsule Take 1 capsule (60 mg total) by mouth daily. 90 capsule 3  . esomeprazole (NEXIUM) 40 MG capsule Take 40 mg by mouth 2 (two) times daily before a meal.    . furosemide (LASIX) 20 MG tablet Take 2 tablets (40 mg total) by mouth daily. 180 tablet 3  . gabapentin (NEURONTIN) 100 MG capsule Take 1 capsule (100 mg total) by mouth 3 (three) times daily. One three times daily 90 capsule 2  . isosorbide dinitrate (ISORDIL) 30 MG tablet Take 30 mg by mouth daily.    Marland Kitchen losartan (COZAAR) 100 MG tablet Take 1 tablet (100 mg total) daily by mouth. 90 tablet 0  . mirabegron ER (MYRBETRIQ) 50 MG TB24 tablet Take 50 mg by mouth daily.    . nebivolol (BYSTOLIC) 5 MG tablet Take 1 tablet (5 mg total) by mouth daily. 90 tablet 3  . nitroGLYCERIN (NITRODUR - DOSED IN MG/24 HR) 0.2 mg/hr patch Apply 1/4th patch to affected shoulder, change daily 30 patch 1  . nitroGLYCERIN (NITROSTAT) 0.4 MG SL tablet Place 1 tablet (0.4 mg total) under the tongue every 5 (five) minutes as needed for chest pain.  25 tablet 3  . PRESCRIPTION MEDICATION Place 1 application into the left eye at bedtime as needed (rash). Opthalmic ointment    . rizatriptan (MAXALT) 10 MG tablet Take 1 tablet for headache.  May repeat once in 2 hours if needed.  Do not exceed 2 tablets in 24 hours. 10 tablet 3  . ticagrelor (BRILINTA) 90 MG TABS tablet Take 1 tablet (90 mg total) by mouth 2 (two) times daily. 180 tablet 2  . tiZANidine (ZANAFLEX) 4 MG tablet Take 1 tablet (4 mg total) by mouth at bedtime. 30 tablet 3  . tolterodine (DETROL LA) 4 MG 24 hr capsule Take 4 mg by mouth daily.     No current facility-administered medications on file prior to visit.     ALLERGIES: Allergies  Allergen Reactions  . Ibuprofen     Other reaction(s): Upset Stomach  .  Ampicillin Nausea Only  . Asa [Aspirin] Diarrhea    Other reaction(s): Upset Stomach  . Darvon [Propoxyphene] Nausea And Vomiting  . Demeclocycline Other (See Comments)    Nerves feeling  . Eggs Or Egg-Derived Products Diarrhea  . Lactalbumin Diarrhea  . Lisinopril Cough  . Milk-Related Compounds Diarrhea  . Salicylates Other (See Comments)    Unknown Unknown   . Tetracycline Hcl Hives and Nausea Only  . Tetracyclines & Related Other (See Comments)    Nerves feeling     FAMILY HISTORY: Family History  Problem Relation Age of Onset  . Heart disease Mother 34       CABG, pacemaker, valve  . Cancer Father   . Heart disease Sister        Fluid around the heart  . Breast cancer Neg Hx     SOCIAL HISTORY: Social History   Socioeconomic History  . Marital status: Divorced    Spouse name: Not on file  . Number of children: Not on file  . Years of education: Not on file  . Highest education level: Not on file  Occupational History  . Not on file  Social Needs  . Financial resource strain: Not on file  . Food insecurity:    Worry: Not on file    Inability: Not on file  . Transportation needs:    Medical: Not on file    Non-medical: Not on  file  Tobacco Use  . Smoking status: Never Smoker  . Smokeless tobacco: Never Used  Substance and Sexual Activity  . Alcohol use: Yes    Comment: Socially. [name].  . Drug use: No  . Sexual activity: Never  Lifestyle  . Physical activity:    Days per week: Not on file    Minutes per session: Not on file  . Stress: Not on file  Relationships  . Social connections:    Talks on phone: Not on file    Gets together: Not on file    Attends religious service: Not on file    Active member of club or organization: Not on file    Attends meetings of clubs or organizations: Not on file    Relationship status: Not on file  . Intimate partner violence:    Fear of current or ex partner: Not on file    Emotionally abused: Not on file    Physically abused: Not on file    Forced sexual activity: Not on file  Other Topics Concern  . Not on file  Social History Narrative   Current Social History        Who lives at home: Lives with youngest daughter, "Olivia Mackie" 09/25/2016    Transportation: Public 7/84/6962   Important Relationships & Pets: 2 daughters (52, 11), 1 son (7), 10 grandchildren, 4 great grandchildren 09/25/2016    Current Stressors: Recently moved from [place], [place] due to threat of NH placement 09/25/2016   Religious / Personal Beliefs: Christian 09/25/2016   Interests / Fun: Dancing 09/25/2016   Other: Participates in [employer] 09/25/2016    REVIEW OF SYSTEMS: Constitutional: No fevers, chills, or sweats, no generalized fatigue, change in appetite Eyes: vision loss Ear, nose and throat: No hearing loss, ear pain, nasal congestion, sore throat Cardiovascular: No chest pain, palpitations Respiratory:  No shortness of breath at rest or with exertion, wheezes GastrointestinaI: diarrhea Genitourinary:  No dysuria, urinary retention or frequency Musculoskeletal:  Back pain Integumentary: No rash, pruritus, skin lesions Neurological: as above Psychiatric: No  depression,  insomnia, anxiety Endocrine: No palpitations, fatigue, diaphoresis, mood swings, change in appetite, change in weight, increased thirst Hematologic/Lymphatic:  No purpura, petechiae. Allergic/Immunologic: no itchy/runny eyes, nasal congestion, recent allergic reactions, rashes  PHYSICAL EXAM: Blood pressure (!) 142/74, pulse (!) 53, height 5\' 6"  (1.676 m), weight (!) 306 lb (138.8 kg), SpO2 93 %. General: No acute distress.  Patient appears well-groomed.  Morbidly obese Head:  Normocephalic/atraumatic Eyes:  Fundi examined but not visualized Neck: supple, no paraspinal tenderness, full range of motion Heart:  Regular rate and rhythm Lungs:  Clear to auscultation bilaterally Back: No paraspinal tenderness Neurological Exam: alert and oriented to person, place, and time. Attention span and concentration intact, recent and remote memory intact, fund of knowledge intact.  Speech fluent and not dysarthric, language intact.  Decreased temporal vision loss in right eye.  Otherwise, CN II-XII intact. Bulk and tone normal, muscle strength 5/5 throughout.  Sensation to light touch, temperature and vibration intact.  Deep tendon reflexes 2+ throughout, toes downgoing.  Finger to nose testing intact.  Gait unsteady.  Romberg with sway.  IMPRESSION: 1.  Chronic migraine without aura, without status migrainosus, not intractable 2.  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  Morbid obesity (BMI 49.39 kg/m2) 4.  HTN  PLAN: 1.  Start topiramate 50mg  at bedtime.  Contact me in 6 weeks with update and we can increase dose if needed. 2.  Take tizanidine 6mg  at bedtime 3.  Stop rizatriptan.  Instead, take flurbiprofen 50mg  at earliest onset of headache.  May repeat dose every 8 hours, no more than 3 tablets in 24 hours.  Limit use to no more than 2 days out of week to prevent rebound headache 4.  Keep headache diary 5.  Repeat MRI of brain and pituitary gland with  and without contrast 6.  Advised to make follow up appointment with Dr. Kathlen Mody for repeat eye exam 7.  Follow up blood pressure with PCP 8.  Weight loss 9.  Follow up in 4 months.  25 minutes spent face to face with patient, over 50% spent discussing management.  Metta Clines, DO  CC: Joni Reining, DO

## 2018-04-10 ENCOUNTER — Ambulatory Visit (INDEPENDENT_AMBULATORY_CARE_PROVIDER_SITE_OTHER): Payer: Medicare Other | Admitting: Neurology

## 2018-04-10 ENCOUNTER — Encounter: Payer: Self-pay | Admitting: Neurology

## 2018-04-10 ENCOUNTER — Ambulatory Visit: Payer: Medicare Other | Admitting: Neurology

## 2018-04-10 ENCOUNTER — Encounter

## 2018-04-10 ENCOUNTER — Ambulatory Visit: Payer: Self-pay

## 2018-04-10 VITALS — BP 142/74 | HR 53 | Ht 66.0 in | Wt 306.0 lb

## 2018-04-10 DIAGNOSIS — I10 Essential (primary) hypertension: Secondary | ICD-10-CM

## 2018-04-10 DIAGNOSIS — D352 Benign neoplasm of pituitary gland: Secondary | ICD-10-CM

## 2018-04-10 DIAGNOSIS — H539 Unspecified visual disturbance: Secondary | ICD-10-CM

## 2018-04-10 DIAGNOSIS — G43009 Migraine without aura, not intractable, without status migrainosus: Secondary | ICD-10-CM | POA: Diagnosis not present

## 2018-04-10 MED ORDER — TIZANIDINE HCL 6 MG PO CAPS: 6.0000 mg | ORAL_CAPSULE | Freq: Every day | ORAL | 3 refills | Status: DC

## 2018-04-10 MED ORDER — FLURBIPROFEN 50 MG PO TABS: ORAL_TABLET | ORAL | 3 refills | Status: DC

## 2018-04-10 MED ORDER — TOPIRAMATE 50 MG PO TABS: 50.0000 mg | ORAL_TABLET | Freq: Every day | ORAL | 3 refills | Status: DC

## 2018-04-10 NOTE — Patient Instructions (Signed)
1.  Start topiramate 50mg  at bedtime.  Contact me in 6 weeks with update and we can increase dose if needed. 2.  Take tizanidine 6mg  at bedtime 3.  Stop rizatriptan.  Instead, take flurbiprofen 50mg  at earliest onset of headache.  May repeat dose every 8 hours, no more than 3 tablets in 24 hours.  Limit use to no more than 2 days out of week to prevent rebound headache 4.  Keep headache diary 5.  Follow up in 4 months.

## 2018-04-10 NOTE — Addendum Note (Signed)
Addended by: Clois Comber on: 04/10/2018 08:22 AM   Modules accepted: Orders

## 2018-04-11 ENCOUNTER — Encounter: Payer: Self-pay | Admitting: Internal Medicine

## 2018-04-11 ENCOUNTER — Other Ambulatory Visit: Payer: Medicare Other

## 2018-04-11 ENCOUNTER — Ambulatory Visit (INDEPENDENT_AMBULATORY_CARE_PROVIDER_SITE_OTHER): Payer: Medicare Other | Admitting: Internal Medicine

## 2018-04-11 ENCOUNTER — Other Ambulatory Visit: Payer: Self-pay

## 2018-04-11 ENCOUNTER — Ambulatory Visit (HOSPITAL_COMMUNITY)
Admission: RE | Admit: 2018-04-11 | Discharge: 2018-04-11 | Disposition: A | Discharge status: Home / Self Care | Payer: Medicare Other | Source: Ambulatory Visit | Attending: Internal Medicine | Admitting: Internal Medicine

## 2018-04-11 ENCOUNTER — Observation Stay (HOSPITAL_COMMUNITY): Payer: Medicare Other

## 2018-04-11 ENCOUNTER — Encounter (HOSPITAL_COMMUNITY): Payer: Self-pay | Admitting: General Practice

## 2018-04-11 ENCOUNTER — Observation Stay (HOSPITAL_COMMUNITY)
Admission: AD | Admit: 2018-04-11 | Discharge: 2018-04-12 | Disposition: A | Discharge status: Home / Self Care | Payer: Medicare Other | Source: Ambulatory Visit | Attending: Student in an Organized Health Care Education/Training Program | Admitting: Student in an Organized Health Care Education/Training Program

## 2018-04-11 VITALS — BP 151/93 | HR 50 | Temp 97.8°F | Ht 66.0 in | Wt 306.0 lb

## 2018-04-11 DIAGNOSIS — R001 Bradycardia, unspecified: Secondary | ICD-10-CM | POA: Diagnosis not present

## 2018-04-11 DIAGNOSIS — R5382 Chronic fatigue, unspecified: Secondary | ICD-10-CM | POA: Diagnosis not present

## 2018-04-11 DIAGNOSIS — J45909 Unspecified asthma, uncomplicated: Secondary | ICD-10-CM | POA: Insufficient documentation

## 2018-04-11 DIAGNOSIS — I739 Peripheral vascular disease, unspecified: Secondary | ICD-10-CM | POA: Diagnosis not present

## 2018-04-11 DIAGNOSIS — Z79891 Long term (current) use of opiate analgesic: Secondary | ICD-10-CM | POA: Insufficient documentation

## 2018-04-11 DIAGNOSIS — F329 Major depressive disorder, single episode, unspecified: Secondary | ICD-10-CM | POA: Insufficient documentation

## 2018-04-11 DIAGNOSIS — G4733 Obstructive sleep apnea (adult) (pediatric): Secondary | ICD-10-CM | POA: Insufficient documentation

## 2018-04-11 DIAGNOSIS — E785 Hyperlipidemia, unspecified: Secondary | ICD-10-CM | POA: Diagnosis not present

## 2018-04-11 DIAGNOSIS — Z86018 Personal history of other benign neoplasm: Secondary | ICD-10-CM | POA: Diagnosis not present

## 2018-04-11 DIAGNOSIS — G894 Chronic pain syndrome: Secondary | ICD-10-CM | POA: Diagnosis not present

## 2018-04-11 DIAGNOSIS — Z79899 Other long term (current) drug therapy: Secondary | ICD-10-CM | POA: Diagnosis not present

## 2018-04-11 DIAGNOSIS — Z7951 Long term (current) use of inhaled steroids: Secondary | ICD-10-CM | POA: Diagnosis not present

## 2018-04-11 DIAGNOSIS — Z923 Personal history of irradiation: Secondary | ICD-10-CM | POA: Insufficient documentation

## 2018-04-11 DIAGNOSIS — I2510 Atherosclerotic heart disease of native coronary artery without angina pectoris: Secondary | ICD-10-CM | POA: Diagnosis not present

## 2018-04-11 DIAGNOSIS — Z9989 Dependence on other enabling machines and devices: Secondary | ICD-10-CM | POA: Diagnosis not present

## 2018-04-11 DIAGNOSIS — Z8541 Personal history of malignant neoplasm of cervix uteri: Secondary | ICD-10-CM | POA: Insufficient documentation

## 2018-04-11 DIAGNOSIS — R058 Other specified cough: Secondary | ICD-10-CM

## 2018-04-11 DIAGNOSIS — I10 Essential (primary) hypertension: Secondary | ICD-10-CM | POA: Insufficient documentation

## 2018-04-11 DIAGNOSIS — R0902 Hypoxemia: Principal | ICD-10-CM | POA: Insufficient documentation

## 2018-04-11 DIAGNOSIS — K219 Gastro-esophageal reflux disease without esophagitis: Secondary | ICD-10-CM | POA: Insufficient documentation

## 2018-04-11 DIAGNOSIS — Z955 Presence of coronary angioplasty implant and graft: Secondary | ICD-10-CM | POA: Diagnosis not present

## 2018-04-11 DIAGNOSIS — R0609 Other forms of dyspnea: Secondary | ICD-10-CM

## 2018-04-11 DIAGNOSIS — R5383 Other fatigue: Secondary | ICD-10-CM | POA: Insufficient documentation

## 2018-04-11 DIAGNOSIS — N3281 Overactive bladder: Secondary | ICD-10-CM | POA: Insufficient documentation

## 2018-04-11 DIAGNOSIS — R05 Cough: Secondary | ICD-10-CM

## 2018-04-11 DIAGNOSIS — Z7982 Long term (current) use of aspirin: Secondary | ICD-10-CM | POA: Diagnosis not present

## 2018-04-11 LAB — BASIC METABOLIC PANEL
ANION GAP: 7 (ref 5–15)
BUN: 9 mg/dL (ref 8–23)
CHLORIDE: 110 mmol/L (ref 98–111)
CO2: 25 mmol/L (ref 22–32)
Calcium: 8.8 mg/dL — ABNORMAL LOW (ref 8.9–10.3)
Creatinine, Ser: 0.87 mg/dL (ref 0.44–1.00)
Glucose, Bld: 95 mg/dL (ref 70–99)
POTASSIUM: 3.4 mmol/L — AB (ref 3.5–5.1)
SODIUM: 142 mmol/L (ref 135–145)

## 2018-04-11 LAB — PULMONARY FUNCTION TEST
FEF 25-75 Post: 1.46 L/sec
FEF 25-75 Pre: 1.68 L/sec
FEF2575-%Change-Post: -13 %
FEF2575-%PRED-POST: 77 %
FEF2575-%Pred-Pre: 89 %
FEV1-%CHANGE-POST: -5 %
FEV1-%PRED-PRE: 78 %
FEV1-%Pred-Post: 74 %
FEV1-POST: 1.52 L
FEV1-Pre: 1.61 L
FEV1FVC-%Change-Post: 1 %
FEV1FVC-%Pred-Pre: 107 %
FEV6-%Change-Post: -6 %
FEV6-%PRED-PRE: 75 %
FEV6-%Pred-Post: 70 %
FEV6-POST: 1.79 L
FEV6-Pre: 1.92 L
FEV6FVC-%PRED-POST: 103 %
FEV6FVC-%Pred-Pre: 103 %
FVC-%Change-Post: -6 %
FVC-%PRED-POST: 68 %
FVC-%PRED-PRE: 72 %
FVC-PRE: 1.92 L
FVC-Post: 1.79 L
POST FEV6/FVC RATIO: 100 %
PRE FEV6/FVC RATIO: 100 %
Post FEV1/FVC ratio: 85 %
Pre FEV1/FVC ratio: 84 %

## 2018-04-11 LAB — CBC
HCT: 41.9 % (ref 36.0–46.0)
HEMOGLOBIN: 13 g/dL (ref 12.0–15.0)
MCH: 28.3 pg (ref 26.0–34.0)
MCHC: 31 g/dL (ref 30.0–36.0)
MCV: 91.1 fL (ref 78.0–100.0)
PLATELETS: 188 10*3/uL (ref 150–400)
RBC: 4.6 MIL/uL (ref 3.87–5.11)
RDW: 13.2 % (ref 11.5–15.5)
WBC: 7.8 10*3/uL (ref 4.0–10.5)

## 2018-04-11 LAB — TSH: TSH: 1.86 u[IU]/mL (ref 0.350–4.500)

## 2018-04-11 LAB — CORTISOL-AM, BLOOD: Cortisol - AM: 5.7 ug/dL — ABNORMAL LOW (ref 6.7–22.6)

## 2018-04-11 LAB — BRAIN NATRIURETIC PEPTIDE: B NATRIURETIC PEPTIDE 5: 82.6 pg/mL (ref 0.0–100.0)

## 2018-04-11 MED ORDER — SODIUM CHLORIDE 0.9 % IN NEBU
3.0000 mL | INHALATION_SOLUTION | Freq: Once | RESPIRATORY_TRACT | Status: AC
  Administered 2018-04-11: 3 mL via RESPIRATORY_TRACT
  Filled 2018-04-11: qty 3

## 2018-04-11 MED ORDER — AMLODIPINE BESYLATE 10 MG PO TABS: 10.0000 mg | ORAL_TABLET | Freq: Every day | ORAL | Status: DC

## 2018-04-11 MED ORDER — ENOXAPARIN SODIUM 40 MG/0.4ML ~~LOC~~ SOLN
40.0000 mg | SUBCUTANEOUS | Status: DC
  Administered 2018-04-11: 40 mg via SUBCUTANEOUS
  Filled 2018-04-11: qty 0.4

## 2018-04-11 MED ORDER — METHACHOLINE 16 MG/ML NEB SOLN
2.0000 mL | Freq: Once | RESPIRATORY_TRACT | Status: DC
  Filled 2018-04-11: qty 2

## 2018-04-11 MED ORDER — METHACHOLINE 4 MG/ML NEB SOLN
2.0000 mL | Freq: Once | RESPIRATORY_TRACT | Status: AC
  Administered 2018-04-11: 8 mg via RESPIRATORY_TRACT
  Filled 2018-04-11: qty 2

## 2018-04-11 MED ORDER — METHACHOLINE 1 MG/ML NEB SOLN
2.0000 mL | Freq: Once | RESPIRATORY_TRACT | Status: AC
  Administered 2018-04-11: 2 mg via RESPIRATORY_TRACT
  Filled 2018-04-11: qty 2

## 2018-04-11 MED ORDER — MOMETASONE FURO-FORMOTEROL FUM 200-5 MCG/ACT IN AERO: 1.0000 | INHALATION_SPRAY | Freq: Two times a day (BID) | RESPIRATORY_TRACT | Status: DC

## 2018-04-11 MED ORDER — ALBUTEROL SULFATE (2.5 MG/3ML) 0.083% IN NEBU
2.5000 mg | INHALATION_SOLUTION | Freq: Once | RESPIRATORY_TRACT | Status: AC
  Administered 2018-04-11: 2.5 mg via RESPIRATORY_TRACT

## 2018-04-11 MED ORDER — METHACHOLINE 0.0625 MG/ML NEB SOLN
2.0000 mL | Freq: Once | RESPIRATORY_TRACT | Status: AC
  Administered 2018-04-11: 0.125 mg via RESPIRATORY_TRACT
  Filled 2018-04-11: qty 2

## 2018-04-11 MED ORDER — METHACHOLINE 0.25 MG/ML NEB SOLN
2.0000 mL | Freq: Once | RESPIRATORY_TRACT | Status: AC
  Administered 2018-04-11: 0.5 mg via RESPIRATORY_TRACT
  Filled 2018-04-11: qty 2

## 2018-04-11 MED ORDER — MOMETASONE FURO-FORMOTEROL FUM 100-5 MCG/ACT IN AERO
1.0000 | INHALATION_SPRAY | Freq: Two times a day (BID) | RESPIRATORY_TRACT | Status: DC
  Administered 2018-04-11 – 2018-04-12 (×2): 1 via RESPIRATORY_TRACT
  Filled 2018-04-11: qty 8.8

## 2018-04-11 MED ORDER — LOSARTAN POTASSIUM 50 MG PO TABS
100.0000 mg | ORAL_TABLET | Freq: Every day | ORAL | Status: DC
Start: 2018-04-11 — End: 2018-04-12
  Administered 2018-04-11 – 2018-04-12 (×2): 100 mg via ORAL
  Filled 2018-04-11 (×2): qty 2

## 2018-04-11 NOTE — Assessment & Plan Note (Signed)
I had requested this visit to reevaluate her opioid use medications.  She again reports taking MSCotin 15mg  once daily in the morning.  She denies any percocet use.  Review of [place] shows 1 fill of MSCotin and 1 fill of precocet which is consistent with her reporting however these were filled in late June, as I had previously noted is not completely consistent with her reported use.  I would favor discontinuing MSCotin entirely to see how she does off opioids as I am not sure they are providing clear benefit and I certainly have some concern about her use (less than prescribed, ?forgetting to take)

## 2018-04-11 NOTE — H&P (Signed)
Date: 04/11/2018               Patient Name:  Alisha [name] MRN: [id number]  DOB: [birth year] Age / Sex: 67 y.o., female   PCP: Lucious Groves, DO         Medical Service: Internal Medicine Teaching Service         Attending Physician: Dr. Evette Doffing, Mallie Mussel, *    First Contact: Dr. Sherry Ruffing Pager: [phone]  Second Contact: Dr. Hetty Ely Pager: [phone]       After Hours (After 5p/  First Contact Pager: [phone]  weekends / holidays): Second Contact Pager: [phone]   Chief Complaint: Fatigue  History of Present Illness:  This is a 67 year old female with a history of migraines, arthritis, fibromyalgia, OSA, CAD (s/p stent in 2017), pituitary adenoma(s/p surgery and radiation), essential HTN, and bipolar depression who is a direct admit from clinic where she was found to have an oxygen saturation of 45%, she was placed on 2L [place] and her oxygen saturation returned to 100% with a pulse rate in the 80s. She reports that she has had increased fatigue in the past couple of months, where she will fall asleep when she is getting dress. She also reports worsening shortness of breath with minimal exertion, only able to walk for a few steps before becoming short of breath.. She also reports a new cough in the past 1-2 weeks, which became productive with some reported red streaks in it. She reported that she gets pneumonia every couple of years but that it's often accompanied with fevers. She denies any fevers, chills, nausea, or vomiting. She denies any recent air or car travel.   She also reports chills, feeling very cold, some weight gain, reports 20 lbs in the past month, and some dry skin. She denied any hair loss or constipation. She had a TSH in 12/14/17 which was 1.02. Sister has a thyroid disorder.   She does have a history of sleep apnea on CPAP. She reports that she has not been using it in the past 2 weeks because she falls asleep very quickly after getting in bed.   She was last seen  in clinic in 6/27 for follow up of her chronic pain. Her MSCotin 15 mg daily was prescribed at that time and percocet. She reported taht she has not ta. She also reports some wheezing, that improves with using a nebulizer and an inhaler. She was placed on steroids for chronic cough syndrome but this was stopped recently. Saw pulmonology on 04/02/18, they did not feel like her respiratory symptoms were [name] to asthma. She had been started on chlorpheniramine at that time. They did a methacholine challenge test while in the clinic which did not show any signs of obstruction.   Meds:  Current Meds  Medication Sig  . albuterol (PROVENTIL) (2.5 MG/3ML) 0.083% nebulizer solution Take 3 mLs (2.5 mg total) by nebulization every 6 (six) hours as needed for wheezing or shortness of breath.  Marland Kitchen albuterol (VENTOLIN HFA) 108 (90 Base) MCG/ACT inhaler Inhale 1 puff into the lungs every 6 (six) hours as needed for wheezing or shortness of breath.  Marland Kitchen amLODipine (NORVASC) 10 MG tablet Take 1 tablet (10 mg total) daily by mouth.  Marland Kitchen aspirin EC 81 MG tablet Take 1 tablet (81 mg total) by mouth daily.  Marland Kitchen atorvastatin (LIPITOR) 20 MG tablet TAKE 1 TABLET BY MOUTH EVERY DAY  . baclofen (LIORESAL) 10 MG tablet Take 1 tablet (  10 mg total) by mouth at bedtime.  . budesonide-formoterol (SYMBICORT) 80-4.5 MCG/ACT inhaler Inhale 2 puffs into the lungs 2 (two) times daily.  . diclofenac sodium (VOLTAREN) 1 % GEL Apply 2 g topically 4 (four) times daily. Rub into affected area of foot 2 to 4 times daily  . DULoxetine (CYMBALTA) 60 MG capsule Take 1 capsule (60 mg total) by mouth daily.  Marland Kitchen esomeprazole (NEXIUM) 40 MG capsule Take 40 mg by mouth 2 (two) times daily before a meal.  . flurbiprofen (ANSAID) 50 MG tablet Take 1 tablet every 8 hours as needed, no more than 3 tablets in 24 hours  . furosemide (LASIX) 20 MG tablet Take 2 tablets (40 mg total) by mouth daily.  . isosorbide dinitrate (ISORDIL) 30 MG tablet Take 30 mg by mouth  daily.  Marland Kitchen losartan (COZAAR) 100 MG tablet Take 1 tablet (100 mg total) daily by mouth.  . mirabegron ER (MYRBETRIQ) 50 MG TB24 tablet Take 50 mg by mouth daily.  Marland Kitchen morphine (MS CONTIN) 15 MG 12 hr tablet Take 15 mg by mouth daily.  . nebivolol (BYSTOLIC) 5 MG tablet Take 1 tablet (5 mg total) by mouth daily.  . nitroGLYCERIN (NITRODUR - DOSED IN MG/24 HR) 0.2 mg/hr patch Apply 1/4th patch to affected shoulder, change daily  . oxyCODONE-acetaminophen (PERCOCET/ROXICET) 5-325 MG tablet Take 1 tablet by mouth daily as needed for severe pain.  Marland Kitchen PRESCRIPTION MEDICATION Place 1 application into the left eye at bedtime as needed (rash). Opthalmic ointment  . Propylene Glycol (SYSTANE BALANCE) 0.6 % SOLN Apply 1 drop to eye as needed (dry eyes).  . ticagrelor (BRILINTA) 90 MG TABS tablet Take 1 tablet (90 mg total) by mouth 2 (two) times daily.  . tizanidine (ZANAFLEX) 6 MG capsule Take 1 capsule (6 mg total) by mouth at bedtime.  . tolterodine (DETROL LA) 4 MG 24 hr capsule Take 4 mg by mouth daily.     Allergies: Allergies as of 04/11/2018 - Review Complete 04/11/2018  Allergen Reaction Noted  . Ibuprofen  08/15/2012  . Ampicillin Nausea Only 09/25/2016  . Asa [aspirin] Diarrhea 04/12/2004  . Darvon [propoxyphene] Nausea And Vomiting 04/12/2004  . Demeclocycline Other (See Comments) 09/25/2016  . Eggs or egg-derived products Diarrhea 02/19/2017  . Lactalbumin Diarrhea 02/19/2017  . Lisinopril Cough 05/04/2017  . Milk-related compounds Diarrhea 02/19/2017  . Salicylates Other (See Comments) 09/25/2016  . Tetracycline hcl Hives and Nausea Only 04/12/2004  . Tetracyclines & related Other (See Comments) 09/25/2016   Past Medical History:  Diagnosis Date  . Arthritis   . Asthma   . Bipolar disorder ([place])   . CAD (coronary artery disease) 07/07/2016   a. s/p stenting x 2 in [place] to the LAD, RCA is chronically occluded, LVEDP was 34  . Cancer ([facility])    cervical  . Depression   .  Fibromyalgia   . GERD (gastroesophageal reflux disease)   . History of benign pituitary tumor   . History of kidney stones    1972  . Hyperlipidemia   . Hypertension   . Migraine   . Morbid obesity ([place])   . Peripheral vascular disease ([place])   . Pneumonia   . Seizures ([place])    during surgery for pituitary tumor  . Sleep apnea    with cpap  . Vertigo     Family History: Mothers side has heart disease, fathers side has stomach cancer, sister has a thyroid disease  Social History: Reports occasional EtOH use, no  smoking or drug use. Lives with her daughter. Volunteers at [employer].   Review of Systems: A complete ROS was negative except as per HPI.   Physical Exam: There were no vitals taken for this visit. General: appears stated age, fatigued, mild distress and mildly obese HEENT: PERRLA, tender to palpation on left cervical lymph nodes, tenderness near left lower neck area Heart: S1, S2 normal, no murmur, rub or gallop, regular rate and rhythm Lungs: expiratory wheezes Abdomen: abdomen is soft without significant tenderness, masses, organomegaly or guarding Extremities: 1+ bilateral LE edema, moves all extremities Skin:no rashes, no wounds Neurology: normal without focal findings, mental status, speech normal, alert and oriented x3, PERLA and reflexes normal and symmetric Psychiatry: Normal mood and affect   Assessment & Plan by Problem: This is a 67 year old female with a history of arthritis, fibromyalgia, OSA, CAD (s/p stent in 2017), pituitary adenoma(s/p surgery and radiation), essential HTN, and bipolar depression who is a direct admit from clinic where she was found to be fatigued and hypoxic with an oxygenation of 45%.   Fatigue: Patient has been getting progressively more fatigued over the last few months with minimal exertion. She also has been having worsening shortness of breath and will often fall asleep while trying to get dressed. She also reports some weight  gain, cold intolerance, dry skin, and chills. On exam she appeared very fatigued, lungs were CTA, 1+ LE edema.  She had a benign pituitary tumor that she received surgery and radiation therapy which can cause TSH deficiency in a small percent of patients however a TSH in 12/14/17 was 1.020. She also has a reported history of steroid use in the past.  It's unclear what the cause of the fatigue is. Some of the main concerns are endocrine abnormalities, such as hypothyroid or adrenal insufficiency, electrolyte abnormalities, anemia, or polypharmacy. She also has not been using her CPAP for the last 2 weeks which may have contributed to the excessive fatigue. -TSH -CBC -CMP -AM cortisol -Daily weights  Hypoxia: She was initially found to be hypoxic during her clinic visit to 45%. She was placed on 2L [place] and oxygenation improved to 100%. She reports some productive cough with some possible reported hemoptysis.  She has a history of some type of pulmonary disease, last saw her pulmonologist on 04/02/18 who thought that this was not likely [name] to asthma. She does have allergies to to dog, dust and ragweed.   She does report that her albuterol and nebulizer helps her shortness of breath. This could be [name] to another episode of pneumonia however she was afebrile and had a normal WBC count. Her oxygen improved to 100% with the 2L nasal cannula and she remained in the high 90s when we saw her and she was off the oxygen.  Ruthe Mannan -Chest x-ray  Obstructive sleep apnea: She is on CPAP at home however has not been using it. She had a sleep study which showed AHI of 7.3/hr and oxygen desaturation down to 88%.  -Continue CPAP overnight  Overactive bladder: She has been having episodes of incontinence while she was sleeping. She was referred to a urologist.  She is on mybetriq for this.   Hypertension: Patient is on amlodipine and losartan at home. She was hypertensive to 163/93 on admission.  -Continue  losartan -Monitor vitals  FEN: No fluids, replete lytes prn, heart healthy diet  VTE ppx: Lovenox  Code Status: FULL    Dispo: Admit patient to Observation with expected length  of stay less than 2 midnights.  Signed: Asencion Noble, MD 04/11/2018, 6:22 PM  Pager: [phone]

## 2018-04-11 NOTE — Progress Notes (Signed)
Pt. Arrived via wheelchair as a direct admit from doctors office for Hypoxia. Pt. A&Ox4, denies pain, N/V. Pt. Transferred from wheelchair to bed independently. VS Stable, SpO2 100 RA. Pt was a difficult IV stick, paged IV team, 20 G LAC via Ultrasound. Doctor made aware of pt arrival, new orders received. Pt oriented to room, call light w/in reach, bed in lowest position and bed alarm set. Will continue to monitor.

## 2018-04-11 NOTE — Progress Notes (Signed)
Subjective:  HPI: [name] is a 67 y.o. female who presents for f/u of chronic pain syndrome.  Initial vitals were remarkable to O2 Sat of 50%.  She was placed on supplemental o2 2L[place], on my evaluation after she had taken a few deep breaths her O2 sat was 100% but pulse was in 80s.  She reported excessive fatigue and dyspnea with minimal exertion.  In my discussion with her she did seem for fatigued that usual, she notes minimal activity causes extreme fatigue, only able to get around with use of powered scooter.  She denies any wheezing.  She saw her pulmonologist Dr Alisha [name] on 04/02/18, I have reviewed his note, he does not feel her respiratory symptoms are due to asthma has recommended d/c of symbicort.  With plan for methacholine challenge testing which was completed earlier today and does not show any evidence of obstruction, does show possible restrictive disease. Also of note she did see Dr Alisha [name] her neurologist yesterday vitals from that [place] show a pulse of 53 with SpO2 f 93%  Topiramate 50mg  and Flubiprofen PRN was started for migraines and Rizatriptan was d/c.   Please see Assessment and Plan below for the status of her chronic medical problems.  Review of Systems: Review of Systems  Constitutional: Positive for malaise/fatigue. Negative for chills, fever and weight loss.  Respiratory: Positive for cough and shortness of breath. Negative for sputum production and wheezing.   Cardiovascular: Negative for chest pain and orthopnea.  Gastrointestinal: Positive for diarrhea (one episode a few days ago after eating [employer]). Negative for abdominal pain.  Musculoskeletal: Negative for myalgias.  Skin: Negative for rash.  Neurological: Positive for weakness and headaches. Negative for dizziness, sensory change, speech change, focal weakness and seizures.  Psychiatric/Behavioral: Negative for depression and substance abuse.    Objective:  Physical Exam: Vitals:   04/11/18 1400  04/11/18 1516  BP: (!) 151/93   Pulse: 100 (!) 50  Temp: 97.8 F (36.6 C)   TempSrc: Oral   SpO2: (!) 45% 100%  Weight: (!) 306 lb (138.8 kg)   Height: 5\' 6"  (1.676 m)   Body mass index is 49.39 kg/m.  Physical Exam  Constitutional: She appears well-developed and well-nourished. She appears ill. No distress.  Eyes: Pupils are equal, round, and reactive to light.  Neck: No JVD present.  Cardiovascular: Regular rhythm. Bradycardia present.  Pulmonary/Chest: Effort normal and breath sounds normal. No accessory muscle usage. No apnea and no tachypnea. She has no wheezes. She has no rhonchi. She has no rales.  Abdominal: Soft. Bowel sounds are normal.  Neurological: She is alert.  Skin: Skin is warm and dry. Capillary refill takes less than 2 seconds. She is not diaphoretic.  Nursing note and vitals reviewed.  Assessment & Plan:  Hypoxia A: Hypoxia  P: Profound hypoxia on arrival, concerning.  Appears to have improved with a few deep breaths. ? Brief Apnea? Dr Alisha [name] has established no Asthma (mild restrictive pattern on PFTS from today).   Other things that concern me are polypharmacy, opioid overdose (pupils regular size, now on much lower dose on chronic MSCotin), adrenal insufficency (history of steroid use), electrolyte abnormalities, infection.  -Discussed with patient may be benefitial to admit to observation overnight and obtain initial labs.  Discussed with Dr Alisha [name] who will admit to Dr Alisha [name] service.  -Consider ordering CMP, CBC, TSH, AM cortisol  Chronic pain syndrome I had requested this visit to reevaluate her opioid use medications.  She again reports  taking MSCotin 15mg  once daily in the morning.  She denies any percocet use.  Review of [place] shows 1 fill of MSCotin and 1 fill of precocet which is consistent with her reporting however these were filled in late June, as I had previously noted is not completely consistent with her reported use.  I would favor  discontinuing MSCotin entirely to see how she does off opioids as I am not sure they are providing clear benefit and I certainly have some concern about her use (less than prescribed, ?forgetting to take)  Bradycardia Check EKG, hold bystolic  Chronic fatigue Repeat TSH, check CBC and CMP, consider additional hormone testing given hx of pituitary resection.   Medications Ordered No orders of the defined types were placed in this encounter.  Other Orders Orders Placed This Encounter  Procedures  . EKG 12-Lead   Follow Up: No follow-ups on file.  TBD by inpatient team.- Admitted for observation

## 2018-04-11 NOTE — Assessment & Plan Note (Signed)
Repeat TSH, check CBC and CMP, consider additional hormone testing given hx of pituitary resection.

## 2018-04-11 NOTE — Assessment & Plan Note (Addendum)
A: Hypoxia  P: Profound hypoxia on arrival, concerning.  Appears to have improved with a few deep breaths. ? Brief Apnea? Dr Melvyn Novas has established no Asthma (mild restrictive pattern on PFTS from today).   Other things that concern me are polypharmacy, opioid overdose (pupils regular size, now on much lower dose on chronic MSCotin), adrenal insufficency (history of steroid use), electrolyte abnormalities, infection.  -Discussed with patient may be benefitial to admit to observation overnight and obtain initial labs.  Discussed with Dr Hetty Ely who will admit to Dr Azucena Freed service.  -Consider ordering CMP, CBC, TSH, AM cortisol

## 2018-04-11 NOTE — Patient Outreach (Signed)
[facility]) Care Management  04/11/2018  Shaia Porath Jun 26, 1951 [id number]  TELEPHONE SCREENING Referral date:03/19/18 Referral source:EMMI Campaign Referral reason:Emmi engagement score Insurance:[employer] Attempt #3  Telephone call to patient regardingEMMI campaign referral. Unable to reach. HIPAA compliant voice message left with call back phone number.   PLAN: If no return call from patient will proceed with case closure.    Quinn Plowman RN,BSN,CCM [facility] Telephonic  [phone]

## 2018-04-11 NOTE — Progress Notes (Signed)
Patient refused CPAP at this time.

## 2018-04-11 NOTE — Progress Notes (Signed)
Spoke with pt and notified of results per Dr. Melvyn Novas. Pt verbalized understanding and denied any questions. She states currently admitted to [facility] with low o2 sats and increased SOB. Will forward to MW to make him aware. Since she is admitted I did not prescribe anything. I advised her to call for f/u here once d/c'ed.

## 2018-04-11 NOTE — Assessment & Plan Note (Signed)
Check EKG, hold bystolic

## 2018-04-12 ENCOUNTER — Telehealth: Payer: Self-pay | Admitting: Internal Medicine

## 2018-04-12 DIAGNOSIS — I10 Essential (primary) hypertension: Secondary | ICD-10-CM

## 2018-04-12 DIAGNOSIS — Z79899 Other long term (current) drug therapy: Secondary | ICD-10-CM | POA: Diagnosis not present

## 2018-04-12 DIAGNOSIS — I2510 Atherosclerotic heart disease of native coronary artery without angina pectoris: Secondary | ICD-10-CM

## 2018-04-12 DIAGNOSIS — I739 Peripheral vascular disease, unspecified: Secondary | ICD-10-CM | POA: Diagnosis not present

## 2018-04-12 DIAGNOSIS — R0902 Hypoxemia: Principal | ICD-10-CM

## 2018-04-12 DIAGNOSIS — Z881 Allergy status to other antibiotic agents status: Secondary | ICD-10-CM

## 2018-04-12 DIAGNOSIS — Z91011 Allergy to milk products: Secondary | ICD-10-CM

## 2018-04-12 DIAGNOSIS — G4733 Obstructive sleep apnea (adult) (pediatric): Secondary | ICD-10-CM

## 2018-04-12 DIAGNOSIS — Z7951 Long term (current) use of inhaled steroids: Secondary | ICD-10-CM | POA: Diagnosis not present

## 2018-04-12 DIAGNOSIS — Z79891 Long term (current) use of opiate analgesic: Secondary | ICD-10-CM | POA: Diagnosis not present

## 2018-04-12 DIAGNOSIS — M1990 Unspecified osteoarthritis, unspecified site: Secondary | ICD-10-CM

## 2018-04-12 DIAGNOSIS — E785 Hyperlipidemia, unspecified: Secondary | ICD-10-CM | POA: Diagnosis not present

## 2018-04-12 DIAGNOSIS — Z888 Allergy status to other drugs, medicaments and biological substances status: Secondary | ICD-10-CM

## 2018-04-12 DIAGNOSIS — Z7982 Long term (current) use of aspirin: Secondary | ICD-10-CM | POA: Diagnosis not present

## 2018-04-12 DIAGNOSIS — Z86018 Personal history of other benign neoplasm: Secondary | ICD-10-CM

## 2018-04-12 DIAGNOSIS — Z923 Personal history of irradiation: Secondary | ICD-10-CM

## 2018-04-12 DIAGNOSIS — Z91012 Allergy to eggs: Secondary | ICD-10-CM

## 2018-04-12 DIAGNOSIS — K219 Gastro-esophageal reflux disease without esophagitis: Secondary | ICD-10-CM | POA: Diagnosis not present

## 2018-04-12 DIAGNOSIS — Z886 Allergy status to analgesic agent status: Secondary | ICD-10-CM

## 2018-04-12 DIAGNOSIS — J45909 Unspecified asthma, uncomplicated: Secondary | ICD-10-CM | POA: Diagnosis not present

## 2018-04-12 DIAGNOSIS — R5383 Other fatigue: Secondary | ICD-10-CM

## 2018-04-12 DIAGNOSIS — N3281 Overactive bladder: Secondary | ICD-10-CM | POA: Diagnosis not present

## 2018-04-12 DIAGNOSIS — Z9989 Dependence on other enabling machines and devices: Secondary | ICD-10-CM | POA: Diagnosis not present

## 2018-04-12 DIAGNOSIS — M797 Fibromyalgia: Secondary | ICD-10-CM

## 2018-04-12 DIAGNOSIS — Z955 Presence of coronary angioplasty implant and graft: Secondary | ICD-10-CM | POA: Diagnosis not present

## 2018-04-12 DIAGNOSIS — Z885 Allergy status to narcotic agent status: Secondary | ICD-10-CM

## 2018-04-12 DIAGNOSIS — G43909 Migraine, unspecified, not intractable, without status migrainosus: Secondary | ICD-10-CM

## 2018-04-12 DIAGNOSIS — F319 Bipolar disorder, unspecified: Secondary | ICD-10-CM

## 2018-04-12 LAB — HIV ANTIBODY (ROUTINE TESTING W REFLEX): HIV Screen 4th Generation wRfx: NONREACTIVE

## 2018-04-12 MED ORDER — ATORVASTATIN CALCIUM 20 MG PO TABS
20.0000 mg | ORAL_TABLET | Freq: Every day | ORAL | Status: DC
  Administered 2018-04-12: 20 mg via ORAL
  Filled 2018-04-12: qty 1

## 2018-04-12 MED ORDER — PANTOPRAZOLE SODIUM 40 MG PO TBEC
40.0000 mg | DELAYED_RELEASE_TABLET | Freq: Every day | ORAL | Status: DC
  Administered 2018-04-12: 40 mg via ORAL
  Filled 2018-04-12: qty 1

## 2018-04-12 MED ORDER — ASPIRIN EC 81 MG PO TBEC
81.0000 mg | DELAYED_RELEASE_TABLET | Freq: Every day | ORAL | Status: DC
  Administered 2018-04-12: 81 mg via ORAL
  Filled 2018-04-12: qty 1

## 2018-04-12 MED ORDER — ISOSORBIDE DINITRATE 30 MG PO TABS
30.0000 mg | ORAL_TABLET | Freq: Every day | ORAL | Status: DC
  Administered 2018-04-12: 30 mg via ORAL
  Filled 2018-04-12: qty 1

## 2018-04-12 MED ORDER — MIRABEGRON ER 25 MG PO TB24
50.0000 mg | ORAL_TABLET | Freq: Every day | ORAL | Status: DC
  Administered 2018-04-12: 50 mg via ORAL
  Filled 2018-04-12: qty 2

## 2018-04-12 MED ORDER — NEBIVOLOL HCL 5 MG PO TABS: 2.5000 mg | ORAL_TABLET | Freq: Every day | ORAL | 3 refills | Status: DC

## 2018-04-12 MED ORDER — MORPHINE SULFATE ER 15 MG PO TBCR
15.0000 mg | EXTENDED_RELEASE_TABLET | Freq: Every day | ORAL | Status: DC
Start: 2018-04-12 — End: 2018-04-12
  Administered 2018-04-12: 15 mg via ORAL
  Filled 2018-04-12: qty 1

## 2018-04-12 MED ORDER — AMLODIPINE BESYLATE 10 MG PO TABS
10.0000 mg | ORAL_TABLET | Freq: Every day | ORAL | Status: DC
  Administered 2018-04-12: 10 mg via ORAL
  Filled 2018-04-12: qty 1

## 2018-04-12 MED ORDER — DULOXETINE HCL 60 MG PO CPEP
60.0000 mg | ORAL_CAPSULE | Freq: Every day | ORAL | Status: DC
  Administered 2018-04-12: 60 mg via ORAL
  Filled 2018-04-12: qty 1

## 2018-04-12 MED ORDER — TICAGRELOR 90 MG PO TABS
90.0000 mg | ORAL_TABLET | Freq: Two times a day (BID) | ORAL | Status: DC
  Administered 2018-04-12: 90 mg via ORAL
  Filled 2018-04-12: qty 1

## 2018-04-12 NOTE — Progress Notes (Signed)
We were paged about concern for shortness of breath, wheezing, dizziness and went to see the patient at bedside. She reported that she felt dizzy after bending over to pick something up from the floor and associated this with a feeling of shortness of breath and began breathing heavy. She ambulated earlier today and had to stop and rest at times. She describes the distance that she ambulates on a regular day being less than the distance that we ambulated her in the halls, usually she uses her wheelchair for distances such as using the restroom at work and only ambulates with a cane when she is in her home. Pulse ox was checked during ambulation and she maintained oxygen saturations in the high 90s.   She has the pulse ox on at this time and it is reading 99%.  Orthostatic vitals were checked earlier today:  Lying BP 158/76, HR 44 Sitting 171/76, HR 49  Standing 176/101, HR 53  On exam she is speaking in full sentences, there is not a wheeze appreciated, she has accessory muscle use and appears breathless when we first entered the room but this is improved a few minutes into our conversation. Lungs are clear to auscultation and heart is slow rate with regular rhythm.   Exam remains reassuring and she is agreeable with plan for discharge. She is concerned that she did not get a good nights rest while here overnight and feels that she will sleep better at home. We discussed the acute care clinic appointment that she has scheduled for next week and return precautions.

## 2018-04-12 NOTE — Progress Notes (Signed)
SLP Cancellation Note  Patient Details Name: Alisha [name] MRN: [id number] DOB: 1950/09/28   Cancelled treatment:       Reason Eval/Treat Not Completed: Other (comment) Order received for management of chronic cough, for which she is already being followed by OP MD. Given chronic nature, will defer acute evaluation by SLP and recommend additional f/u with MD, who could consider OP SLP services.   Germain Osgood 04/12/2018, 1:24 PM  Germain Osgood, M.A. CCC-SLP [phone]

## 2018-04-12 NOTE — Discharge Summary (Signed)
Name: Alisha [name] MRN: [id number] DOB: October 26, 1950 67 y.o. PCP: Alisha Groves, DO  Date of Admission: 04/11/2018  4:31 PM Date of Discharge: 04/12/2018  6:53 PM Attending Physician: Axel Filler, *  Discharge Diagnosis: 1. Hypoxia 2. Fatigue  Discharge Medications: Allergies as of 04/12/2018      Reactions   Ibuprofen    Other reaction(s): Upset Stomach   Ampicillin Nausea Only   Asa [aspirin] Diarrhea   Other reaction(s): Upset Stomach   Darvon [propoxyphene] Nausea And Vomiting   Demeclocycline Other (See Comments)   Nerves feeling   Eggs Or Egg-derived Products Diarrhea   Lactalbumin Diarrhea   Lisinopril Cough   Milk-related Compounds Diarrhea   Salicylates Other (See Comments)   Unknown Unknown   Tetracycline Hcl Hives, Nausea Only   Tetracyclines & Related Other (See Comments)   Nerves feeling      Medication List    STOP taking these medications   baclofen 10 MG tablet Commonly known as:  LIORESAL   tizanidine 6 MG capsule Commonly known as:  ZANAFLEX     TAKE these medications   VENTOLIN HFA 108 (90 Base) MCG/ACT inhaler Generic drug:  albuterol Inhale 1 puff into the lungs every 6 (six) hours as needed for wheezing or shortness of breath.   albuterol (2.5 MG/3ML) 0.083% nebulizer solution Commonly known as:  PROVENTIL Take 3 mLs (2.5 mg total) by nebulization every 6 (six) hours as needed for wheezing or shortness of breath.   amLODipine 10 MG tablet Commonly known as:  NORVASC Take 1 tablet (10 mg total) daily by mouth.   aspirin EC 81 MG tablet Take 1 tablet (81 mg total) by mouth daily.   atorvastatin 20 MG tablet Commonly known as:  LIPITOR TAKE 1 TABLET BY MOUTH EVERY DAY   budesonide-formoterol 80-4.5 MCG/ACT inhaler Commonly known as:  SYMBICORT Inhale 2 puffs into the lungs 2 (two) times daily.   diclofenac sodium 1 % Gel Commonly known as:  VOLTAREN Apply 2 g topically 4 (four) times daily. Rub into affected  area of foot 2 to 4 times daily   DULoxetine 60 MG capsule Commonly known as:  CYMBALTA Take 1 capsule (60 mg total) by mouth daily.   esomeprazole 40 MG capsule Commonly known as:  NEXIUM Take 40 mg by mouth 2 (two) times daily before a meal.   flurbiprofen 50 MG tablet Commonly known as:  ANSAID Take 1 tablet every 8 hours as needed, no more than 3 tablets in 24 hours   furosemide 20 MG tablet Commonly known as:  LASIX Take 2 tablets (40 mg total) by mouth daily.   gabapentin 100 MG capsule Commonly known as:  NEURONTIN Take 1 capsule (100 mg total) by mouth 3 (three) times daily. One three times daily   isosorbide dinitrate 30 MG tablet Commonly known as:  ISORDIL Take 30 mg by mouth daily.   losartan 100 MG tablet Commonly known as:  COZAAR Take 1 tablet (100 mg total) daily by mouth.   morphine 15 MG 12 hr tablet Commonly known as:  MS CONTIN Take 15 mg by mouth daily.   MYRBETRIQ 50 MG Tb24 tablet Generic drug:  mirabegron ER Take 50 mg by mouth daily.   nebivolol 5 MG tablet Commonly known as:  BYSTOLIC Take 1 tablet (5 mg total) by mouth daily.   nitroGLYCERIN 0.2 mg/hr patch Commonly known as:  NITRODUR - Dosed in mg/24 hr Apply 1/4th patch to affected shoulder, change daily  oxyCODONE-acetaminophen 5-325 MG tablet Commonly known as:  PERCOCET/ROXICET Take 1 tablet by mouth daily as needed for severe pain.   PRESCRIPTION MEDICATION Place 1 application into the left eye at bedtime as needed (rash). Opthalmic ointment   SYSTANE BALANCE 0.6 % Soln Generic drug:  Propylene Glycol Apply 1 drop to eye as needed (dry eyes).   ticagrelor 90 MG Tabs tablet Commonly known as:  BRILINTA Take 1 tablet (90 mg total) by mouth 2 (two) times daily.   tolterodine 4 MG 24 hr capsule Commonly known as:  DETROL LA Take 4 mg by mouth daily.   topiramate 50 MG tablet Commonly known as:  TOPAMAX Take 1 tablet (50 mg total) by mouth at bedtime.        Disposition and follow-up:   [name] was discharged from [facility] in Stable condition.  At the hospital follow up visit please address:  1.  We asked her to stop her baclofen and tizanidine due to them possibly contributing to her fatigue.  She was bradycardic on admission, we decreased her Bistolic dose to 2.5 mg, please check heart rate and consider switching medications.  Please see if she is using her CPAP machine.   2.  Labs / imaging needed at time of follow-up: None  3.  Pending labs/ test needing follow-up: None  Follow-up Appointments: Follow-up Information    Alisha Groves, DO. Go to.   Specialty:  Internal Medicine Why:  August 23nd at 1:15. If this does not work for you please call and make an adjustment.  Contact information: [place] [address] [address] [place] Hospital Course by problem list: 1. Hypoxia: Patient was a direct admit from clinic for a oxygen saturation of 45%. She has an extensive history of migraines, fibromyalgia, OSA, CAD (s/p stent in 2017). She was placed on 2L [place] while in the clinic and her oxygen saturation improved to 100%, she had been off the oxygen when we saw her and her oxygen remained in the high 90's. Her oxygen remained in this range during her whole admission. It's unclear what caused this sudden onset of hypoxia. She did have a productive cough, reported a possible episode of hemoptysis, and has reports getting pneumonia every couple of years, however she was afebrile during the admission, and her CXR was negative for any acute cardiopulmonary changes so pneumonia was less likely. She has been followed by pulmonology who ordered PFTs which she had completed the day of admission. So she did get a methacholine challenge test which also could have contributed to the sudden hypoxia after causing bronchoconstriction. She ambulated the day of discharge and her oxygen saturation  stayed 96% and above. An hour after this she reported that she started having some shortness of breath and dizziness. We evaluated her at that time and her lungs were CTA and her oxygen saturation stayed in the high 90s. She reported that she normally does not walk around that much. She was found to have asthma on the PFTs so continue her symbicort.   2. Fatigue: She reports that she has been getting progressively more fatigued over the past few months and that she has been getting short of breath with minimal exertion. She appeared very fatigued, with some LE edema and her lungs and cardiac exam were benign. She had an EKG that showed sinus bradycardia. She had been bradycardic for the whole admission, she is  on bistolic at home. There was some concern for adrenal insufficieny on admission but it was discovered that she had not taken chronic steroids for over 2 years, we did get a morning cortisol however this was not taken at the correct time so the low result was unreliable. There was also some concern for hypothyroidism so we obtained an TSH which was normal. This is likely multifactorial with polypharmacy and not using her CPAP machine. She is on a large number of medications including baclofen, MSContin, percocet (reports that she does not use), and tizanidine which can contribute to fatigue. We asked her to hold her baclofen and tizanidine for now and will assess her fatigue on her hospital follow up. We also asked her to resume using her CPAP machine. We also decreased her Bistolic dose to 2.5mg  per day.   Discharge Vitals:   BP 111/85 (BP Location: Right Arm)   Pulse (!) 48   Temp 97.8 F (36.6 C) (Oral)   Resp 20   Ht 5\' 6"  (1.676 m)   Wt (!) 137.8 kg   SpO2 99%   BMI 49.03 kg/m   Pertinent Labs, Studies, and Procedures:  CBC Latest Ref Rng & Units 04/11/2018 12/14/2017 10/25/2017  WBC 4.0 - 10.5 K/uL 7.8 6.4 8.3  Hemoglobin 12.0 - 15.0 g/dL 13.0 13.5 13.7  Hematocrit 36.0 - 46.0 % 41.9  40.4 41.0  Platelets 150 - 400 K/uL 188 217 236.0   Discharge Instructions: Discharge Instructions    Call MD for:  difficulty breathing, headache or visual disturbances   Complete by:  As directed    Call MD for:  extreme fatigue   Complete by:  As directed    Call MD for:  hives   Complete by:  As directed    Call MD for:  persistant dizziness or light-headedness   Complete by:  As directed    Call MD for:  persistant nausea and vomiting   Complete by:  As directed    Call MD for:  redness, tenderness, or signs of infection (pain, swelling, redness, odor or green/yellow discharge around incision site)   Complete by:  As directed    Call MD for:  severe uncontrolled pain   Complete by:  As directed    Call MD for:  temperature >100.4   Complete by:  As directed    Diet - low sodium heart healthy   Complete by:  As directed    Increase activity slowly   Complete by:  As directed       Signed: Asencion Noble, MD 04/12/2018, 4:51 PM   Pager: [phone]

## 2018-04-12 NOTE — Discharge Instructions (Signed)
[name],   It has been a pleasure working with you and we are glad you're feeling better. You were hospitalized for fatigue and hypoxia (low oxygen).   For your fatigue,  You are on a lot of medications and it's possible that they may have contributed to your fatigue.  STOP taking the baclofen and tizanidine Please decrease your Bistolic to 2.5mg , which is 1/2 a pill Please follow up with your primary care provider to discuss these changes Please use your CPAP machine at night as your obstructive sleep apnea can also cause you to become fatigued  For your hypoxia, Please continue your inhaler and nebulizer and follow up with your pulmonologist  Follow up with your primary care provider in 1-2 weeks  If your symptoms worsen or you develop new symptoms, please seek medical help whether it is your primary care provider or emergency department.  If you have any questions about this hospitalization please call [phone].

## 2018-04-12 NOTE — Progress Notes (Signed)
RN notified that patient started to have expiratory wheezing, SOB, and reported dizziness. Vitals were take and BP was 111/85 (95), HR 48, temp 97.8, O2 sats are at 99 with Room air. Internal Medicine paged and notified.

## 2018-04-12 NOTE — Telephone Encounter (Signed)
Hospital f/u per Dr Sherry Ruffing; pt appt 08/23 115pm/NW

## 2018-04-12 NOTE — Progress Notes (Signed)
Subjective: Patient was feeling good today, she was much more alert today compared to yesterday. No acute events overnight. She reports that she did not feel like herself yesterday, states that she just has been very fatigued for a while. She did not want the CPAP last night because she had not been using it for awhile. She denied any shortness of breath or chest pain today. She reported that she is ready to go home.   Objective:  Vital signs in last 24 hours: Vitals:   04/11/18 1730 04/11/18 1845 04/11/18 2102  BP: (!) 163/86    Pulse: (!) 47  (!) 59  Resp: 17  16  Temp: 98.5 F (36.9 C)    TempSrc: Oral    SpO2: 100%  97%  Weight:  (!) 137.8 kg   Height:  5\' 6"  (1.676 m)     General: Well nourished, well appearing, NAD  HEENT: Normocephalic, nontraumatic, midline trachea Cardiac: RRR, normal S1, S2, no murmurs, rubs or gallops  Pulmonary: Lungs CTA bilaterally, no wheezing, rhonchi or rales  Abdomen: Soft, non-tender, +bowel sounds Extremity: LE edema 1+ bilaterally Neuro: Alert and oriented x3, PERRLA, moves all extremities Psychiatry: Normal mood and affect     Assessment/Plan: This is a 67 year old female with a significant PMH including arthritis, fibromyalgia, OSA, CAD (s/p stent in 2017), pituitary adenoma(s/p surgery and radiation), essential HTN, and bipolar depression, who presented from clinic after having been found to be fatigued and hypoxic to 45%.   Fatigue: Patient feels a lot better today, more energy, she reported that she did not feel like herself yesterday. She had not gotten all of her medications today, she is on morphine, percocet (reports that she hasn't been taking), baclofen, and tizanidine at home which can all cause drowsiness as a side effect. This fatigue may be related to her polypharmacy. It also could be related to her not using her CPAP machine lately, which can also contribute to drowsiness. Her TSH was normal so hypothyroidism is ruled out. Her  cortisol was low but taken at the wrong time to be useful, she reports that she has not been on chronic steroids for 2 years so there is less of a concern for adrenal insufficiency, however can be followed up outpatient. Will hold some medications on discharge that may be contributing to her drowsiness.   -AM cortisol was drawn at 5PM, so the 5.7 is not reliable -TSH was 1.87 -Free T4 -Hold neuromuscular drugs on discharge  Hypoxia: She was initially found to be hypoxic to 45% but improved back to 100% after being placed on 2L [place]. She continues to oxygenate well and has had no drop in her O2 sat since that time. She did get a methacholine challenge test the day of admission which may have contributed to this hypoxia. She has been bradycardic while in the hospital, telemetry showed sinus bradycardia, she is on nebivolol at home.  -Continue to use albuterol and nebulizer as needed -Follow up with pulmonology -Hold nebivolol and follow up with cardiology  OSA: She has not been using her CPAP for a few weeks. She reports that she hadn't been using it because she has another sleep test in a few weeks. She declined CPAP last night.  -Continue CPAP on discharge  FEN: No fluids, replete lytes prn, heart health diet  VTE ppx: Lovenox  Code Status: FULL   Dispo: Anticipated discharge in approximately today.   Asencion Noble, MD 04/12/2018, 6:22 AM Pager:  [phone] 

## 2018-04-12 NOTE — Progress Notes (Signed)
SATURATION QUALIFICATIONS: (This note is used to comply with regulatory documentation for home oxygen)  Patient Saturations on Room Air at Rest = 100%  Patient Saturations on Room Air while Ambulating = 96%  Patient Saturations on 0 Liters of oxygen while Ambulating = 96%  Patient ambulated 250 ft without oxygen. Pt sats stayed above 96. Patient did however have to stop for breaks and appeared short of breath.

## 2018-04-16 ENCOUNTER — Ambulatory Visit (HOSPITAL_BASED_OUTPATIENT_CLINIC_OR_DEPARTMENT_OTHER): Payer: Medicare Other | Attending: Cardiology | Admitting: Cardiology

## 2018-04-16 VITALS — Ht 66.0 in | Wt 306.0 lb

## 2018-04-16 DIAGNOSIS — G4733 Obstructive sleep apnea (adult) (pediatric): Secondary | ICD-10-CM | POA: Diagnosis not present

## 2018-04-16 DIAGNOSIS — E669 Obesity, unspecified: Secondary | ICD-10-CM | POA: Insufficient documentation

## 2018-04-16 DIAGNOSIS — I493 Ventricular premature depolarization: Secondary | ICD-10-CM | POA: Diagnosis not present

## 2018-04-16 DIAGNOSIS — Z6841 Body Mass Index (BMI) 40.0 and over, adult: Secondary | ICD-10-CM | POA: Insufficient documentation

## 2018-04-16 DIAGNOSIS — I10 Essential (primary) hypertension: Secondary | ICD-10-CM | POA: Diagnosis not present

## 2018-04-17 ENCOUNTER — Other Ambulatory Visit: Payer: Self-pay

## 2018-04-17 DIAGNOSIS — M25611 Stiffness of right shoulder, not elsewhere classified: Secondary | ICD-10-CM | POA: Diagnosis not present

## 2018-04-17 DIAGNOSIS — M25511 Pain in right shoulder: Secondary | ICD-10-CM | POA: Diagnosis not present

## 2018-04-17 DIAGNOSIS — R262 Difficulty in walking, not elsewhere classified: Secondary | ICD-10-CM | POA: Diagnosis not present

## 2018-04-17 DIAGNOSIS — R293 Abnormal posture: Secondary | ICD-10-CM | POA: Diagnosis not present

## 2018-04-17 NOTE — Patient Outreach (Signed)
[facility]) Care Management  04/17/2018  Alisha [name] 1950-10-03 [id number]  TELEPHONE SCREENING Referral date:03/19/18 Referral source:EMMI Campaign Referral reason:Emmi engagement score Insurance:[employer]   No response after 3 telephone calls and outreach letter attempt.  PLAN: RNCM will close patient due to being unable to reach.  RNCM will send closure notification to patient's primary MD   Quinn Plowman RN,BSN,CCM [facility] Telephonic  [phone]

## 2018-04-19 ENCOUNTER — Encounter: Payer: Self-pay | Admitting: Internal Medicine

## 2018-04-19 ENCOUNTER — Ambulatory Visit (INDEPENDENT_AMBULATORY_CARE_PROVIDER_SITE_OTHER): Payer: Medicare Other | Admitting: Internal Medicine

## 2018-04-19 DIAGNOSIS — G4733 Obstructive sleep apnea (adult) (pediatric): Secondary | ICD-10-CM | POA: Diagnosis not present

## 2018-04-19 DIAGNOSIS — J4540 Moderate persistent asthma, uncomplicated: Secondary | ICD-10-CM

## 2018-04-19 DIAGNOSIS — R262 Difficulty in walking, not elsewhere classified: Secondary | ICD-10-CM | POA: Diagnosis not present

## 2018-04-19 DIAGNOSIS — R5383 Other fatigue: Secondary | ICD-10-CM | POA: Diagnosis not present

## 2018-04-19 DIAGNOSIS — R293 Abnormal posture: Secondary | ICD-10-CM | POA: Diagnosis not present

## 2018-04-19 DIAGNOSIS — I10 Essential (primary) hypertension: Secondary | ICD-10-CM

## 2018-04-19 DIAGNOSIS — R001 Bradycardia, unspecified: Secondary | ICD-10-CM

## 2018-04-19 DIAGNOSIS — Z79899 Other long term (current) drug therapy: Secondary | ICD-10-CM

## 2018-04-19 DIAGNOSIS — M25511 Pain in right shoulder: Secondary | ICD-10-CM | POA: Diagnosis not present

## 2018-04-19 DIAGNOSIS — I878 Other specified disorders of veins: Secondary | ICD-10-CM

## 2018-04-19 DIAGNOSIS — Z7951 Long term (current) use of inhaled steroids: Secondary | ICD-10-CM

## 2018-04-19 DIAGNOSIS — M25611 Stiffness of right shoulder, not elsewhere classified: Secondary | ICD-10-CM | POA: Diagnosis not present

## 2018-04-19 DIAGNOSIS — Z993 Dependence on wheelchair: Secondary | ICD-10-CM

## 2018-04-19 DIAGNOSIS — Z79891 Long term (current) use of opiate analgesic: Secondary | ICD-10-CM

## 2018-04-19 DIAGNOSIS — R0902 Hypoxemia: Secondary | ICD-10-CM

## 2018-04-19 DIAGNOSIS — Z7689 Persons encountering health services in other specified circumstances: Secondary | ICD-10-CM | POA: Diagnosis not present

## 2018-04-19 DIAGNOSIS — Z9989 Dependence on other enabling machines and devices: Secondary | ICD-10-CM

## 2018-04-19 MED ORDER — MORPHINE SULFATE ER 15 MG PO TBCR: 15.0000 mg | EXTENDED_RELEASE_TABLET | Freq: Every day | ORAL | 0 refills | Status: DC | PRN

## 2018-04-19 NOTE — Assessment & Plan Note (Signed)
Pulmonary function testing were more consistent with asthma.  Cough or any sign of acute exacerbation.  She was advised to continue taking Symbicort and use albuterol as needed.

## 2018-04-19 NOTE — Progress Notes (Signed)
Medicine attending: Medical history, presenting problems, physical findings, and medications, reviewed with resident physician Dr Sumayya Amin on the day of the patient visit and I concur with her evaluation and management plan. 

## 2018-04-19 NOTE — Assessment & Plan Note (Signed)
She continued to have 1+ lower extremity edema.  According to patient she did not took her Lasix today as she was coming to the clinic.  According to her whenever she takes her Lasix her edema improves, on days when she do not take her Lasix she will have some lower extremity edema.  Denies any skin breakage or discomfort.  She was advised to continue taking her Lasix as directed.

## 2018-04-19 NOTE — Assessment & Plan Note (Signed)
BP Readings from Last 3 Encounters:  04/19/18 (!) 154/63  04/12/18 129/84  04/11/18 (!) 156/65   Her blood pressure was elevated today. She did not took her Lasix this morning as she normally do not take her diuretics when getting out of the house. Her Bystolic dose was decreased to 2.5 mg daily because of bradycardia. Patient denies any dizziness, chest pain or exertional dyspnea.  We will continue current management with losartan 100 mg daily, amlodipine 10 mg daily, Bystolic 2.5 mg daily and Lasix 40 mg daily.

## 2018-04-19 NOTE — Patient Instructions (Signed)
Thank you for visiting clinic today. I am giving you MS Contin for 15 days, please follow-up with your primary care as they want you to stop taking this medicine. I am glad that you are doing well, continue taking your medicines as directed. Please follow-up with your PCP with the next month.

## 2018-04-19 NOTE — Assessment & Plan Note (Signed)
Patient started using her CPAP regularly and feeling much better since then.  According to patient she had another sleep study done couple of days ago with no results yet.  She was advised to continue using her CPAP regularly.

## 2018-04-19 NOTE — Progress Notes (Signed)
   CC: For hospital follow-up-admitted for hypoxia and excessive fatigue.  HPI:  [name] is a 67 y.o. with past medical history as listed below came to the clinic for her hospital follow-up.  Patient was admitted at [facility] for 1 day on August 15 until April 12, 2018, after she was found to be hypoxic in the clinic.  During that clinic visit she had her pulmonary function testing with methacholine challenge test just before coming to the clinic and was not feeling well after taking methacholine, found to be hypoxic which quickly resolved with 2 L of oxygen.  During her stay in hospital she maintained her saturation without any supplemental oxygen.  She was also found to have multiple drugs can cause her fatigue and was told to stop taking tizanidine and baclofen.  Her Bystolic was decreased to 2.5 mg daily because of some bradycardia.  Patient also has an history of obstructive sleep apnea was not using her CPAP regularly.  Since discharge patient was feeling much better.  Denies any more excessive fatigue.  She denies any dizziness, chest pain or exertional dyspnea.  She is using her CPAP regularly and feeling refresh in the morning.  She recently had her repeat sleep study done couple of days ago, no results in epic yet. She has stopped taking tizanidine and baclofen as advised. She was asking for her MS Contin, according to patient she never got her prescription for August.  She has no other complaints.  Please see assessment and plan for her chronic conditions.  Past Medical History:  Diagnosis Date  . Arthritis   . Asthma   . Bipolar disorder ([place])   . CAD (coronary artery disease) 07/07/2016   a. s/p stenting x 2 in [place] to the LAD, RCA is chronically occluded, LVEDP was 34  . Cancer ([facility])    cervical  . Depression   . Fibromyalgia   . GERD (gastroesophageal reflux disease)   . History of benign pituitary tumor   . History of kidney stones    1972  .  Hyperlipidemia   . Hypertension   . Migraine   . Morbid obesity ([place])   . Peripheral vascular disease ([place])   . Pneumonia   . Seizures ([place])    during surgery for pituitary tumor  . Sleep apnea    with cpap  . Vertigo    Review of Systems: Negative except mentioned in HPI.  Physical Exam:  Vitals:   04/19/18 1319 04/19/18 1346  BP: (!) 161/75 (!) 154/63  Pulse: (!) 50 (!) 47  Temp: 98.2 F (36.8 C)   TempSrc: Oral   SpO2: 100%   Weight: 299 lb 12.8 oz (136 kg)     General: Vital signs reviewed.  Patient is well-developed and obese, sitting in her power wheelchair, in no acute distress and cooperative with exam.  Head: Normocephalic and atraumatic. Eyes: EOMI, conjunctivae normal, no scleral icterus.  Cardiovascular: RRR, S1 normal, S2 normal, no murmurs, gallops, or rubs. Pulmonary/Chest: Clear to auscultation bilaterally, no wheezes, rales, or rhonchi. Abdominal: Soft, non-tender, non-distended, BS +,  Extremities: 1+ lower extremity edema bilaterally,  pulses symmetric and intact bilaterally. No cyanosis or clubbing. Skin: Warm, dry and intact.  Psychiatric: Normal mood and affect. speech and behavior is normal. Cognition and memory are normal.  Assessment & Plan:   See Encounters Tab for problem based charting.  Patient discussed with Dr. Beryle Beams.

## 2018-04-19 NOTE — Assessment & Plan Note (Signed)
Patient was having her heart rate in low 50s today.  Denies any dizziness, chest pain or exertional dyspnea at this time.  She was tolerating low-dose of Bystolic at 2.5 mg daily very well.  As patient was completely asymptomatic we will continue Bystolic at 2.5 mg daily, will reevaluate during next follow-up visit.

## 2018-04-22 DIAGNOSIS — M25611 Stiffness of right shoulder, not elsewhere classified: Secondary | ICD-10-CM | POA: Diagnosis not present

## 2018-04-22 DIAGNOSIS — R293 Abnormal posture: Secondary | ICD-10-CM | POA: Diagnosis not present

## 2018-04-22 DIAGNOSIS — Z7689 Persons encountering health services in other specified circumstances: Secondary | ICD-10-CM | POA: Diagnosis not present

## 2018-04-22 DIAGNOSIS — R262 Difficulty in walking, not elsewhere classified: Secondary | ICD-10-CM | POA: Diagnosis not present

## 2018-04-22 DIAGNOSIS — M25511 Pain in right shoulder: Secondary | ICD-10-CM | POA: Diagnosis not present

## 2018-04-22 NOTE — Telephone Encounter (Signed)
Patient completed HFU 04/19/2018. Hubbard Hartshorn, RN, BSN

## 2018-04-24 DIAGNOSIS — Z7689 Persons encountering health services in other specified circumstances: Secondary | ICD-10-CM | POA: Diagnosis not present

## 2018-04-24 DIAGNOSIS — M25511 Pain in right shoulder: Secondary | ICD-10-CM | POA: Diagnosis not present

## 2018-04-24 DIAGNOSIS — M25611 Stiffness of right shoulder, not elsewhere classified: Secondary | ICD-10-CM | POA: Diagnosis not present

## 2018-04-24 DIAGNOSIS — R293 Abnormal posture: Secondary | ICD-10-CM | POA: Diagnosis not present

## 2018-04-24 DIAGNOSIS — R262 Difficulty in walking, not elsewhere classified: Secondary | ICD-10-CM | POA: Diagnosis not present

## 2018-04-25 ENCOUNTER — Ambulatory Visit
Admit: 2018-04-25 | Discharge: 2018-04-25 | Disposition: A | Discharge status: Home / Self Care | Payer: Medicare Other | Attending: Neurology | Admitting: Neurology

## 2018-04-25 DIAGNOSIS — R51 Headache: Secondary | ICD-10-CM | POA: Diagnosis not present

## 2018-04-25 DIAGNOSIS — D352 Benign neoplasm of pituitary gland: Secondary | ICD-10-CM

## 2018-04-25 MED ORDER — GADOBENATE DIMEGLUMINE 529 MG/ML IV SOLN: 19.0000 mL | Freq: Once | INTRAVENOUS | Status: DC | PRN

## 2018-04-25 MED ORDER — GADOBENATE DIMEGLUMINE 529 MG/ML IV SOLN
10.0000 mL | Freq: Once | INTRAVENOUS | Status: AC | PRN
  Administered 2018-04-25: 10 mL via INTRAVENOUS

## 2018-04-26 DIAGNOSIS — R262 Difficulty in walking, not elsewhere classified: Secondary | ICD-10-CM | POA: Diagnosis not present

## 2018-04-26 DIAGNOSIS — M25511 Pain in right shoulder: Secondary | ICD-10-CM | POA: Diagnosis not present

## 2018-04-26 DIAGNOSIS — M25611 Stiffness of right shoulder, not elsewhere classified: Secondary | ICD-10-CM | POA: Diagnosis not present

## 2018-04-26 DIAGNOSIS — R293 Abnormal posture: Secondary | ICD-10-CM | POA: Diagnosis not present

## 2018-04-28 NOTE — Procedures (Signed)
   Patient Name: Alisha, [name] Study Date:08/14/2017 04/16/2018 Gender: Female D.O.B: 02-23-1951 Age (years): 53 Referring Provider: Fransico Him MD, ABSM Height (inches): 66 Interpreting Physician: Fransico Him MD, ABSM Weight (lbs): 306 RPSGT: Laren Everts BMI: 48 MRN: [id number] Neck Size: 18.00  CLINICAL INFORMATION Sleep Study Type: NPSG  Indication for sleep study: Fatigue, Hypertension, Morning Headaches, Obesity, OSA, Witnessed Apneas  Epworth Sleepiness Score: 7  Most recent polysomnogram dated 07/09/2017 revealed an AHI of 7.3/h and RDI of 14.6/h.  SLEEP STUDY TECHNIQUE As per the AASM Manual for the Scoring of Sleep and Associated Events v2.3 (April 2016) with a hypopnea requiring 4% desaturations.  The channels recorded and monitored were frontal, central and occipital EEG, electrooculogram (EOG), submentalis EMG (chin), nasal and oral airflow, thoracic and abdominal wall motion, anterior tibialis EMG, snore microphone, electrocardiogram, and pulse oximetry.  MEDICATIONS Medications self-administered by patient taken the night of the study : TIZANIDINE  SLEEP ARCHITECTURE The study was initiated at 10:15:30 PM and ended at 5:31:13 AM.  Sleep onset time was 36.8 minutes and the sleep efficiency was 74.0%%. The total sleep time was 322.5 minutes.  Stage REM latency was 256.5 minutes.  The patient spent 9.8%% of the night in stage N1 sleep, 70.9%% in stage N2 sleep, 0.0%% in stage N3 and 19.4% in REM.  Alpha intrusion was absent.  Supine sleep was 24.81%.  RESPIRATORY PARAMETERS The overall apnea/hypopnea index (AHI) was 11.0 per hour. There were 5 total apneas, including 5 obstructive, 0 central and 0 mixed apneas. There were 54 hypopneas and 27 RERAs.  The AHI during Stage REM sleep was 15.4 per hour.  AHI while supine was 23.3 per hour.  The mean oxygen saturation was 96.2%. The minimum SpO2 during sleep was 79.0%.  moderate snoring was noted  during this study.  CARDIAC DATA The 2 lead EKG demonstrated sinus rhythm. The mean heart rate was 50.8 beats per minute. Other EKG findings include: PVCs.  LEG MOVEMENT DATA The total PLMS were 0 with a resulting PLMS index of 0.0. Associated arousal with leg movement index was 0.2 .  IMPRESSIONS - Mild obstructive sleep apnea occurred during this study (AHI = 11.0/h). - No significant central sleep apnea occurred during this study (CAI = 0.0/h). - Moderate oxygen desaturation was noted during this study (Min O2 = 79.0%). - The patient snored with moderate snoring volume. - EKG findings include PVCs. - Clinically significant periodic limb movements did not occur during sleep. No significant associated arousals.  DIAGNOSIS - Obstructive Sleep Apnea (327.23 [G47.33 ICD-10])  RECOMMENDATIONS - Therapeutic CPAP titration to determine optimal pressure required to alleviate sleep disordered breathing. - Positional therapy avoiding supine position during sleep. - Avoid alcohol, sedatives and other CNS depressants that may worsen sleep apnea and disrupt normal sleep architecture. - Sleep hygiene should be reviewed to assess factors that may improve sleep quality. - Weight management and regular exercise should be initiated or continued if appropriate.  [Electronically signed] 04/28/2018 02:53 PM  Fransico Him MD, ABSM Diplomate, American Board of Sleep Medicine

## 2018-04-30 ENCOUNTER — Ambulatory Visit (INDEPENDENT_AMBULATORY_CARE_PROVIDER_SITE_OTHER): Payer: Medicare Other | Admitting: Adult Health

## 2018-04-30 ENCOUNTER — Encounter: Payer: Self-pay | Admitting: Adult Health

## 2018-04-30 DIAGNOSIS — J4540 Moderate persistent asthma, uncomplicated: Secondary | ICD-10-CM

## 2018-04-30 DIAGNOSIS — J301 Allergic rhinitis due to pollen: Secondary | ICD-10-CM

## 2018-04-30 DIAGNOSIS — R053 Chronic cough: Secondary | ICD-10-CM

## 2018-04-30 DIAGNOSIS — R05 Cough: Secondary | ICD-10-CM
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
## 2018-04-30 NOTE — Assessment & Plan Note (Signed)
Chronic cough with upper airway irritation ? Chronic rhinitis /GERD  Add Allegra and Chlortrimeton .  Trial of gabapentin   Plan  Patient Instructions  Begin Delsym 2 tsp Twice daily  For cough As needed   Begin Allegra 180mg  daily in am  Begin Chlorpheniramine 4mg  At bedtime  .  Go ahead and start Gabapentin 100mg  Three times a day   Follow med calendar closely and bring to each visit .  Continue on Symbicort 2 puffs Twice daily  , rinse well after use.  Follow up with Dr. Melvyn Novas  In 6-8 weeks and As needed

## 2018-04-30 NOTE — Addendum Note (Signed)
Addended by: Della Goo C on: 04/30/2018 04:02 PM   Modules accepted: Orders

## 2018-04-30 NOTE — Progress Notes (Signed)
Chart and office note reviewed in deta > agree with a/p as outlined

## 2018-04-30 NOTE — Patient Instructions (Addendum)
Begin Delsym 2 tsp Twice daily  For cough As needed   Begin Allegra 180mg  daily in am  Begin Chlorpheniramine 4mg  At bedtime  .  Go ahead and start Gabapentin 100mg  Three times a day   Follow med calendar closely and bring to each visit .  Continue on Symbicort 2 puffs Twice daily  , rinse well after use.  Follow up with Dr. Melvyn Novas  In 6-8 weeks and As needed

## 2018-04-30 NOTE — Assessment & Plan Note (Signed)
Add Allegra and Chlortrimeton .   Plan  Patient Instructions  Begin Delsym 2 tsp Twice daily  For cough As needed   Begin Allegra 180mg  daily in am  Begin Chlorpheniramine 4mg  At bedtime  .  Go ahead and start Gabapentin 100mg  Three times a day   Follow med calendar closely and bring to each visit .  Continue on Symbicort 2 puffs Twice daily  , rinse well after use.  Follow up with Dr. Melvyn Novas  In 6-8 weeks and As needed

## 2018-04-30 NOTE — Assessment & Plan Note (Signed)
Moderate persistent asthma with chronic upper airway cough.  Methacholine challenge was positive for asthma Will try to prevent triggers with her postnasal drainage. We will add Allegra and Chlor-Trimeton Advised on cough control regimen with Delsym Continue on GERD treatment For now continue on Symbicort 80.  If symptoms persist consider increasing to 160. Can start Gabapentin for cyclical cough - pt education given .   Patient's medications were reviewed today and patient education was given. Computerized medication calendar was adjusted/completed   Plan  Patient Instructions  Begin Delsym 2 tsp Twice daily  For cough As needed   Begin Allegra 180mg  daily in am  Begin Chlorpheniramine 4mg  At bedtime  .  Go ahead and start Gabapentin 100mg  Three times a day   Follow med calendar closely and bring to each visit .  Continue on Symbicort 2 puffs Twice daily  , rinse well after use.  Follow up with Dr. Melvyn Novas  In 6-8 weeks and As needed

## 2018-05-01 ENCOUNTER — Telehealth: Payer: Self-pay | Admitting: *Deleted

## 2018-05-01 DIAGNOSIS — G4733 Obstructive sleep apnea (adult) (pediatric): Secondary | ICD-10-CM

## 2018-05-01 NOTE — Telephone Encounter (Signed)
-----   Message from [name], MD sent at 04/28/2018  2:55 PM EDT ----- Please let patient know that they have sleep apnea and recommend CPAP titration. Please set up titration in the sleep lab.

## 2018-05-01 NOTE — Telephone Encounter (Signed)
Informed patient of sleep study results and patient understanding was verbalized. Patient understands her sleep study showed they have sleep apnea and recommend CPAP titration.

## 2018-05-02 DIAGNOSIS — R350 Frequency of micturition: Secondary | ICD-10-CM | POA: Diagnosis not present

## 2018-05-02 DIAGNOSIS — N281 Cyst of kidney, acquired: Secondary | ICD-10-CM | POA: Diagnosis not present

## 2018-05-03 DIAGNOSIS — R262 Difficulty in walking, not elsewhere classified: Secondary | ICD-10-CM | POA: Diagnosis not present

## 2018-05-03 DIAGNOSIS — Z7689 Persons encountering health services in other specified circumstances: Secondary | ICD-10-CM | POA: Diagnosis not present

## 2018-05-03 DIAGNOSIS — M25611 Stiffness of right shoulder, not elsewhere classified: Secondary | ICD-10-CM | POA: Diagnosis not present

## 2018-05-03 DIAGNOSIS — R293 Abnormal posture: Secondary | ICD-10-CM | POA: Diagnosis not present

## 2018-05-03 DIAGNOSIS — M25511 Pain in right shoulder: Secondary | ICD-10-CM | POA: Diagnosis not present

## 2018-05-05 ENCOUNTER — Other Ambulatory Visit: Payer: Self-pay | Admitting: Internal Medicine

## 2018-05-07 ENCOUNTER — Telehealth: Payer: Self-pay | Admitting: *Deleted

## 2018-05-07 NOTE — Telephone Encounter (Signed)
-----   Message from Chester Holstein, LPN sent at 01/27/3761 12:32 PM EDT ----- Please call patient.  ----- Message ----- From: Chester Holstein, LPN Sent: 03/30/1516   7:58 AM EDT To: Aileen Pilot Mendi Constable, LPN    ----- Message ----- From: Pieter Partridge, DO Sent: 04/26/2018   6:24 AM EDT To: Clois Comber, CMA  MRI shows no significant changes.  There is residual tumor seen but not changed since last year.  I would repeat MRI of brain and pituitary with and without contrast in one year.

## 2018-05-07 NOTE — Telephone Encounter (Signed)
Patient given results

## 2018-05-10 ENCOUNTER — Telehealth: Payer: Self-pay | Admitting: *Deleted

## 2018-05-10 ENCOUNTER — Encounter: Payer: Self-pay | Admitting: *Deleted

## 2018-05-10 DIAGNOSIS — M25611 Stiffness of right shoulder, not elsewhere classified: Secondary | ICD-10-CM | POA: Diagnosis not present

## 2018-05-10 DIAGNOSIS — R262 Difficulty in walking, not elsewhere classified: Secondary | ICD-10-CM | POA: Diagnosis not present

## 2018-05-10 DIAGNOSIS — R293 Abnormal posture: Secondary | ICD-10-CM | POA: Diagnosis not present

## 2018-05-10 DIAGNOSIS — M25511 Pain in right shoulder: Secondary | ICD-10-CM | POA: Diagnosis not present

## 2018-05-10 NOTE — Telephone Encounter (Addendum)
Patient is scheduled for CPAP Titration on 06/13/18. Patient understands her titration study will be done at [facility] sleep lab. Patient understands she will receive a letter in a week or so detailing appointment, date, time, and location. Patient understands to call if she does not receive the letter  in a timely manner. Left detailed message on voicemail with date and time of titration and informed patient to call back [phone] OR [phone]  to confirm or reschedule.

## 2018-05-10 NOTE — Telephone Encounter (Signed)
-----   Message from [name], [place] sent at 05/10/2018  8:23 AM EDT ----- Regarding: pre cert cpap titration

## 2018-05-10 NOTE — Telephone Encounter (Signed)
Staff message sent to Gae Bon per [facility] web portal no PA is required. Ok to schedule titration study.  Decision [id number].

## 2018-05-13 DIAGNOSIS — R293 Abnormal posture: Secondary | ICD-10-CM | POA: Diagnosis not present

## 2018-05-13 DIAGNOSIS — M25611 Stiffness of right shoulder, not elsewhere classified: Secondary | ICD-10-CM | POA: Diagnosis not present

## 2018-05-13 DIAGNOSIS — R262 Difficulty in walking, not elsewhere classified: Secondary | ICD-10-CM | POA: Diagnosis not present

## 2018-05-13 DIAGNOSIS — M25511 Pain in right shoulder: Secondary | ICD-10-CM | POA: Diagnosis not present

## 2018-05-15 DIAGNOSIS — M25611 Stiffness of right shoulder, not elsewhere classified: Secondary | ICD-10-CM | POA: Diagnosis not present

## 2018-05-15 DIAGNOSIS — M25511 Pain in right shoulder: Secondary | ICD-10-CM | POA: Diagnosis not present

## 2018-05-15 DIAGNOSIS — R262 Difficulty in walking, not elsewhere classified: Secondary | ICD-10-CM | POA: Diagnosis not present

## 2018-05-15 DIAGNOSIS — R293 Abnormal posture: Secondary | ICD-10-CM | POA: Diagnosis not present

## 2018-05-17 DIAGNOSIS — Z7689 Persons encountering health services in other specified circumstances: Secondary | ICD-10-CM | POA: Diagnosis not present

## 2018-05-17 DIAGNOSIS — M25511 Pain in right shoulder: Secondary | ICD-10-CM | POA: Diagnosis not present

## 2018-05-17 DIAGNOSIS — R262 Difficulty in walking, not elsewhere classified: Secondary | ICD-10-CM | POA: Diagnosis not present

## 2018-05-17 DIAGNOSIS — M25611 Stiffness of right shoulder, not elsewhere classified: Secondary | ICD-10-CM | POA: Diagnosis not present

## 2018-05-17 DIAGNOSIS — R293 Abnormal posture: Secondary | ICD-10-CM | POA: Diagnosis not present

## 2018-05-20 ENCOUNTER — Other Ambulatory Visit: Payer: Self-pay | Admitting: Podiatry

## 2018-05-20 ENCOUNTER — Ambulatory Visit (INDEPENDENT_AMBULATORY_CARE_PROVIDER_SITE_OTHER): Payer: Medicare Other | Admitting: Podiatry

## 2018-05-20 ENCOUNTER — Ambulatory Visit (INDEPENDENT_AMBULATORY_CARE_PROVIDER_SITE_OTHER): Payer: Medicare Other

## 2018-05-20 ENCOUNTER — Encounter: Payer: Self-pay | Admitting: Podiatry

## 2018-05-20 DIAGNOSIS — S92422A Displaced fracture of distal phalanx of left great toe, initial encounter for closed fracture: Secondary | ICD-10-CM

## 2018-05-20 DIAGNOSIS — M79674 Pain in right toe(s): Secondary | ICD-10-CM

## 2018-05-20 DIAGNOSIS — M79675 Pain in left toe(s): Secondary | ICD-10-CM | POA: Diagnosis not present

## 2018-05-20 DIAGNOSIS — B351 Tinea unguium: Secondary | ICD-10-CM

## 2018-05-22 DIAGNOSIS — M25511 Pain in right shoulder: Secondary | ICD-10-CM | POA: Diagnosis not present

## 2018-05-22 DIAGNOSIS — M25611 Stiffness of right shoulder, not elsewhere classified: Secondary | ICD-10-CM | POA: Diagnosis not present

## 2018-05-22 DIAGNOSIS — R293 Abnormal posture: Secondary | ICD-10-CM | POA: Diagnosis not present

## 2018-05-22 DIAGNOSIS — R262 Difficulty in walking, not elsewhere classified: Secondary | ICD-10-CM | POA: Diagnosis not present

## 2018-05-22 NOTE — Progress Notes (Signed)
Subjective: 67 y.o. returns the office today for painful, elongated, thickened toenails which she cannot trim herself.  She also says that she did have some pain to her toes in the left foot for the last couple of months mostly to the big toe.  Denies any recent injury or trauma that she can recall.  She has no other concerns today. Denies any systemic complaints such as fevers, chills, nausea, vomiting.   PCP: Lucious Groves, DO  Objective: AAO 3, NAD DP/PT pulses palpable, CRT less than 3 seconds Nails hypertrophic, dystrophic, elongated, brittle, discolored 10. There is tenderness overlying the nails 1-5 bilaterally. There is no surrounding erythema or drainage along the nail sites. No significant pain to the toes today.  Subjectively she gets some pain to the big toe. No open lesions or pre-ulcerative lesions are identified. No other areas of tenderness bilateral lower extremities. No overlying edema, erythema, increased warmth. No pain with calf compression, swelling, warmth, erythema.  Assessment: Patient presents with symptomatic onychomycosis; hyperkeratotic lesion  Plan: -Treatment options including alternatives, risks, complications were discussed -Nails sharply debrided 10 without complication/bleeding. -X-rays obtained reviewed.  Arthritic changes present hardware intact.  No definitive evidence of acute fracture identified. -Discussed daily foot inspection. If there are any changes, to call the office immediately.  -Follow-up in 3 months or sooner if any problems are to arise. In the meantime, encouraged to call the office with any questions, concerns, changes symptoms.  Celesta Gentile, DPM

## 2018-05-24 DIAGNOSIS — Z7689 Persons encountering health services in other specified circumstances: Secondary | ICD-10-CM | POA: Diagnosis not present

## 2018-05-26 ENCOUNTER — Other Ambulatory Visit: Payer: Self-pay | Admitting: Family Medicine

## 2018-05-28 ENCOUNTER — Other Ambulatory Visit: Payer: Self-pay | Admitting: *Deleted

## 2018-05-28 DIAGNOSIS — R51 Headache: Secondary | ICD-10-CM

## 2018-05-28 DIAGNOSIS — G43009 Migraine without aura, not intractable, without status migrainosus: Secondary | ICD-10-CM

## 2018-05-28 DIAGNOSIS — R519 Headache, unspecified: Secondary | ICD-10-CM

## 2018-05-28 DIAGNOSIS — D352 Benign neoplasm of pituitary gland: Secondary | ICD-10-CM

## 2018-05-28 DIAGNOSIS — H53459 Other localized visual field defect, unspecified eye: Secondary | ICD-10-CM

## 2018-05-29 DIAGNOSIS — M25611 Stiffness of right shoulder, not elsewhere classified: Secondary | ICD-10-CM | POA: Diagnosis not present

## 2018-05-29 DIAGNOSIS — R262 Difficulty in walking, not elsewhere classified: Secondary | ICD-10-CM | POA: Diagnosis not present

## 2018-05-29 DIAGNOSIS — R293 Abnormal posture: Secondary | ICD-10-CM | POA: Diagnosis not present

## 2018-05-29 DIAGNOSIS — Z7689 Persons encountering health services in other specified circumstances: Secondary | ICD-10-CM | POA: Diagnosis not present

## 2018-05-29 DIAGNOSIS — M25511 Pain in right shoulder: Secondary | ICD-10-CM | POA: Diagnosis not present

## 2018-05-31 DIAGNOSIS — Z7689 Persons encountering health services in other specified circumstances: Secondary | ICD-10-CM | POA: Diagnosis not present

## 2018-05-31 DIAGNOSIS — M25511 Pain in right shoulder: Secondary | ICD-10-CM | POA: Diagnosis not present

## 2018-05-31 DIAGNOSIS — M25611 Stiffness of right shoulder, not elsewhere classified: Secondary | ICD-10-CM | POA: Diagnosis not present

## 2018-05-31 DIAGNOSIS — R293 Abnormal posture: Secondary | ICD-10-CM | POA: Diagnosis not present

## 2018-05-31 DIAGNOSIS — R262 Difficulty in walking, not elsewhere classified: Secondary | ICD-10-CM | POA: Diagnosis not present

## 2018-06-05 ENCOUNTER — Encounter: Payer: Self-pay | Admitting: Family Medicine

## 2018-06-05 ENCOUNTER — Ambulatory Visit (INDEPENDENT_AMBULATORY_CARE_PROVIDER_SITE_OTHER): Payer: Medicare Other | Admitting: Family Medicine

## 2018-06-05 VITALS — BP 156/81 | Ht 67.72 in | Wt 298.0 lb

## 2018-06-05 DIAGNOSIS — G8929 Other chronic pain: Secondary | ICD-10-CM

## 2018-06-05 DIAGNOSIS — M25511 Pain in right shoulder: Secondary | ICD-10-CM

## 2018-06-05 NOTE — Patient Instructions (Signed)
You're doing great! Continue with therapy and do home exercises on days you don't go to therapy. When you are done with therapy do home exercises 3-4 times a week until your pain resolves. Follow up with me as needed.

## 2018-06-05 NOTE — Progress Notes (Signed)
PCP: Lucious Groves, DO  Subjective:   HPI: Patient is a 67 y.o. female here for right shoulder, arm pain.  1/16: Patient reports she's had right shoulder and elbow pain dating back several months. Believes this may have started in June to July 2018 when she had several falls. Pain is sharp lateral right shoulder and medial right elbow. Associated spasms of these areas. Difficulty getting comfortable. + night pain. Worse with reaching. Prior issues with right rotator cuff and told this was 'going bad' No skin changes, numbness.  3/20: Patient reports her elbow feels completely better. She states she still having pain in her lateral right shoulder though that is worse at nighttime it can radiate down the arm at times. She gets spasms anterolateral shoulder that radiates to the back as well. Tolerating the nitroglycerin patches without any problems. Has not started physical therapy yet. States that the subacromial injection only helped her that day. No skin changes, numbness.  4/24: Patient returns with continued pain in her lateral right shoulder. Pain level 7/10 and sharp. She is starting PT on this coming Monday. Using nitro patches 1/2 patch and changing daily. No new injuries. No skin changes.  6/26: Patient reports that she is doing much better. She states she is doing physical therapy about 3 times a week including has recently started aqua therapy.  Exline she can move her right arm much better. Does not take her as long to wash herself in the shower. Pain she does get that she describes more as a soreness now. She is not taking any medications for her shoulder. No new injuries. No skin changes.  10/9: Patient reports she's doing well. Doing physical therapy - in pool on Mondays and regular PT Wednesday and Friday. Shoulder only bothers some now - more of a tightness superiorly. Can reach behind and overhead better but still with a little pain. Therapy starting  to work on her back too. No skin changes.  Past Medical History:  Diagnosis Date  . Arthritis   . Asthma   . Bipolar disorder ([place])   . CAD (coronary artery disease) 07/07/2016   a. s/p stenting x 2 in [place] to the LAD, RCA is chronically occluded, LVEDP was 34  . Cancer ([facility])    cervical  . Depression   . Fibromyalgia   . GERD (gastroesophageal reflux disease)   . History of benign pituitary tumor   . History of kidney stones    1972  . Hyperlipidemia   . Hypertension   . Migraine   . Morbid obesity ([place])   . Peripheral vascular disease ([place])   . Pneumonia   . Seizures ([facility])    during surgery for pituitary tumor  . Sleep apnea    with cpap  . Vertigo     Current Outpatient Medications on File Prior to Visit  Medication Sig Dispense Refill  . albuterol (PROVENTIL) (2.5 MG/3ML) 0.083% nebulizer solution Take 3 mLs (2.5 mg total) by nebulization every 6 (six) hours as needed for wheezing or shortness of breath. 150 mL 1  . albuterol (VENTOLIN HFA) 108 (90 Base) MCG/ACT inhaler Inhale 1 puff into the lungs every 6 (six) hours as needed for wheezing or shortness of breath.    Marland Kitchen aspirin EC 81 MG tablet Take 1 tablet (81 mg total) by mouth daily. 90 tablet 3  . atorvastatin (LIPITOR) 20 MG tablet TAKE 1 TABLET BY MOUTH EVERY DAY 90 tablet 3  . budesonide-formoterol (SYMBICORT) 80-4.5 MCG/ACT inhaler  Inhale 2 puffs into the lungs 2 (two) times daily. 1 Inhaler 11  . chlorpheniramine (CHLOR-TRIMETON) 4 MG tablet Take 4 mg by mouth 2 (two) times daily as needed for allergies.    Marland Kitchen esomeprazole (NEXIUM) 40 MG capsule Take 40 mg by mouth 2 (two) times daily before a meal.    . fexofenadine (ALLEGRA) 180 MG tablet Take 180 mg by mouth daily.    . furosemide (LASIX) 20 MG tablet Take 2 tablets (40 mg total) by mouth daily. 180 tablet 3  . gabapentin (NEURONTIN) 100 MG capsule TAKE 1 CAPSULE (100 MG TOTAL) BY MOUTH 3 (THREE) TIMES DAILY. 270 capsule 1  . isosorbide dinitrate (ISORDIL) 30  MG tablet Take 30 mg by mouth daily.    Marland Kitchen loratadine (CLARITIN) 10 MG tablet Take 10 mg by mouth daily.  3  . losartan (COZAAR) 100 MG tablet Take 1 tablet (100 mg total) daily by mouth. 90 tablet 0  . mirabegron ER (MYRBETRIQ) 50 MG TB24 tablet Take 50 mg by mouth daily.    Marland Kitchen morphine (MS CONTIN) 15 MG 12 hr tablet Take 1 tablet (15 mg total) by mouth daily as needed for pain. 15 tablet 0  . nitroGLYCERIN (NITRODUR - DOSED IN MG/24 HR) 0.2 mg/hr patch Apply 1/4th patch to affected shoulder, change daily 30 patch 1  . oxybutynin (DITROPAN-XL) 10 MG 24 hr tablet Take 10 mg by mouth at bedtime.    Marland Kitchen oxyCODONE-acetaminophen (PERCOCET/ROXICET) 5-325 MG tablet Take 1 tablet by mouth daily as needed for severe pain.    Marland Kitchen Propylene Glycol (SYSTANE BALANCE) 0.6 % SOLN Apply 1 drop to eye as needed (dry eyes).    . ticagrelor (BRILINTA) 90 MG TABS tablet Take 1 tablet (90 mg total) by mouth 2 (two) times daily. 180 tablet 2  . tiZANidine (ZANAFLEX) 4 MG tablet TAKE 1&1/2 TABLETS DAILY  3  . DULoxetine (CYMBALTA) 60 MG capsule Take 1 capsule (60 mg total) by mouth daily. 90 capsule 3   No current facility-administered medications on file prior to visit.     Past Surgical History:  Procedure Laterality Date  . ANKLE FUSION Bilateral   . carpel tunnel release Bilateral 2017  . CORONARY ANGIOPLASTY    . ESOPHAGOGASTRODUODENOSCOPY (EGD) WITH PROPOFOL N/A 06/14/2017   Procedure: ESOPHAGOGASTRODUODENOSCOPY (EGD) WITH PROPOFOL;  Surgeon: Wilford Corner, MD;  Location: [facility];  Service: Endoscopy;  Laterality: N/A;  . EXCISION / CURETTAGE BONE CYST PHALANGES OF FOOT  2014   Removal of foot cyst   . PARTIAL HYSTERECTOMY  unknown   Patient still has ovaries  . pituitary tumor removal  1999   removed as much as they could, on optic nerve  . TONSILLECTOMY    . TOTAL KNEE ARTHROPLASTY     TKR X 3  2 on the left and 1 on the right    Allergies  Allergen Reactions  . Ibuprofen     Other  reaction(s): Upset Stomach  . Ampicillin Nausea Only  . Asa [Aspirin] Diarrhea    Other reaction(s): Upset Stomach  . Darvon [Propoxyphene] Nausea And Vomiting  . Demeclocycline Other (See Comments)    Nerves feeling  . Eggs Or Egg-Derived Products Diarrhea  . Lactalbumin Diarrhea  . Lisinopril Cough  . Milk-Related Compounds Diarrhea  . Salicylates Other (See Comments)    Unknown Unknown   . Tetracycline Hcl Hives and Nausea Only  . Tetracyclines & Related Other (See Comments)    Nerves feeling  Social History   Socioeconomic History  . Marital status: Divorced    Spouse name: Not on file  . Number of children: Not on file  . Years of education: Not on file  . Highest education level: Not on file  Occupational History  . Not on file  Social Needs  . Financial resource strain: Not on file  . Food insecurity:    Worry: Not on file    Inability: Not on file  . Transportation needs:    Medical: Not on file    Non-medical: Not on file  Tobacco Use  . Smoking status: Never Smoker  . Smokeless tobacco: Never Used  Substance and Sexual Activity  . Alcohol use: Yes    Comment: Socially. [name].  . Drug use: No  . Sexual activity: Not Currently  Lifestyle  . Physical activity:    Days per week: Not on file    Minutes per session: Not on file  . Stress: Not on file  Relationships  . Social connections:    Talks on phone: Not on file    Gets together: Not on file    Attends religious service: Not on file    Active member of club or organization: Not on file    Attends meetings of clubs or organizations: Not on file    Relationship status: Not on file  . Intimate partner violence:    Fear of current or ex partner: Not on file    Emotionally abused: Not on file    Physically abused: Not on file    Forced sexual activity: Not on file  Other Topics Concern  . Not on file  Social History Narrative   Current Social History        Who lives at home: Lives  with youngest daughter, "Olivia Mackie" 09/25/2016    Transportation: Public 3/76/2831   Important Relationships & Pets: 2 daughters (52, 2), 1 son (29), 10 grandchildren, 4 great grandchildren 09/25/2016    Current Stressors: Recently moved from [place], [place] due to threat of NH placement 09/25/2016   Religious / Personal Beliefs: Christian 09/25/2016   Interests / Fun: Dancing 09/25/2016   Other: Participates in [employer] 09/25/2016    Family History  Problem Relation Age of Onset  . Heart disease Mother 78       CABG, pacemaker, valve  . Cancer Father   . Heart disease Sister        Fluid around the heart  . Breast cancer Neg Hx     BP (!) 156/81   Ht 5' 7.72" (1.72 m)   Wt 298 lb (135.2 kg)   BMI 45.69 kg/m   Review of Systems: See HPI above.     Objective:  Physical Exam:  Gen: NAD, comfortable in exam room  Right shoulder: No swelling, ecchymoses.  No gross deformity. Minimal TTP lateral and superior shoulder. ROM full IR, 70 ER, Abduction and flexion to 160 degrees. Mild pain Hawkins, negative Neers. Negative Yergasons. Strength 5/5 with empty can and resisted internal/external rotation.  Mild pain empty can, ER. NV intact distally.  Assessment & Plan:  1. 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  Much better with second subacromial injection, PT, HEP.  Continue PT, transition to HEP when tolerated.  Discussed topical medications to use as needed, tylenol if needed.

## 2018-06-11 ENCOUNTER — Ambulatory Visit (INDEPENDENT_AMBULATORY_CARE_PROVIDER_SITE_OTHER): Payer: Medicare Other | Admitting: Internal Medicine

## 2018-06-11 ENCOUNTER — Encounter: Payer: Self-pay | Admitting: Internal Medicine

## 2018-06-11 VITALS — BP 118/72 | HR 63 | Ht 67.72 in | Wt 304.2 lb

## 2018-06-11 DIAGNOSIS — Z23 Encounter for immunization: Secondary | ICD-10-CM | POA: Diagnosis not present

## 2018-06-11 DIAGNOSIS — R05 Cough: Secondary | ICD-10-CM

## 2018-06-11 DIAGNOSIS — J45991 Cough variant asthma: Secondary | ICD-10-CM

## 2018-06-11 DIAGNOSIS — R058 Other specified cough: Secondary | ICD-10-CM

## 2018-06-11 NOTE — Patient Instructions (Addendum)
No change in medicatoins - follow the med calendar as written   Call us with name of headache medication and strength of your symbicort    Please schedule a follow up office visit in 6 weeks, call sooner if needed with all medications /inhalers/ solutions in hand so we can verify exactly what you are taking. This includes all medications from all doctors and over the [place] separate them into two bags:  the ones you take automatically, no matter what, vs the ones you take just when you feel you need them "BAG #2 is UP TO YOU"  - this will really help [place] help you take your medications more effectively.

## 2018-06-11 NOTE — Progress Notes (Signed)
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    History of Present Illness  10/25/2017 1st [facility] office visit/ Clebert Wenger   On advair x sev years Chief Complaint  Patient presents with  . Pulmonary consult    Dr. Percival Spanish referred pt for SOB, diagnosed with asthma but SOb with exertion for years, dry cough, acid reflux  at hs tends cough and wakes sev times at night x sev years/ on cpap helps some Feels rested and not symptomatic first thing in am then starts the throat clearing and downhill from there the rest of the day Was on ACEi > d/c 05/02/17 and changed to ARB = losartan rec Stop advair and start symbicort 80 Take 2 puffs first thing in am and then another 2 puffs about 12 hours later. Be sure to continue nexium 40 mg Take 30- 60 min before your first and last meals of the day  Only use your albuterol as a rescue medication GERD diet  Please remember to go to the lab and x-ray department downstairs in the basement  for your tests - we will call you with the results when they are available.  Please schedule a follow up office visit in 4 weeks, sooner if needed  with all medications /inhalers/ solutions in hand so we can verify exactly what you are taking. This includes all medications from all doctors and over the counters  add needs cxr on return   11/30/2017  f/u ov/Mora Pedraza re:  Cough x 1980 ? Cough variant asthma/ uacs / did not bring meds because thinks   "they are as listed" (they are not)  Chief Complaint  Patient presents with  . Follow-up    SOB is worse then last ov    Dyspnea:  Back stops before breathing so uses scooter when goes out Cough: daytime continuous urge to clear throat Sleep: fine on cpap SABA use:  Only uses nebulizer 3 x week - list says she has proair but pt denies though "that list is right"  On fosfamax x years rec Stop fosfamax Plan A = Automatic =  symbicort 80 Take 2 puffs first thing in am and then another 2 puffs about 12 hours later.  Work on Interior and spatial designer:   Plan B = Backup Only use your albuterol nebulizer as a rescue medication to be used if you can't catch your breath by resting or doing a relaxed purse lip breathing pattern.  - The less you use it, the better it will work when you need it. - Ok to use the inhaler up to    every 4 hours if you must but call for appointment if use goes up over your usual need  GERD es. For drainage / throat tickle try take CHLORPHENIRAMINE  4 mg - take one every 4 hours as needed - available over the counter- may cause drowsiness so start  with just a bedtime dose or two and see how you tolerate it before trying in daytime    Please remember to go to the  x-ray department downstairs in the basement  for your tests - we will call you with the results when they are available.    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 Follow-up    PFT's done today. Breathing is unchanged. She is still clearing her throat alot. She is using her albuterol neb 2-3 x per wk.   Dyspnea: scooter for back pain > sob  Cough: constant throat clearing  Sleep: cough keeps her up despite 2 pillows, not using 1st gen H1 blockers per guidelines   SABA use:   2- 3 x per week but doesn't help cough  rec Work on inhaler technique:  relax and gently blow all the way out then take a nice smooth deep  breath back in, triggering the inhaler at same time you start breathing in.  Hold for up to 5 seconds if you can. Blow out thru nose. Rinse and gargle with water when done    Please schedule a follow up office visit in 6 weeks, call sooner if needed with all medications /inhalers/ solutions in hand so we can verify exactly what you are taking. This includes all medications from all doctors and over the [place] separate them into two bags:  the ones you take automatically, no matter what, vs the ones you take just when you feel you need them "BAG #2 is UP TO YOU"  - this will really help [place] help you take your medications more effectively.  - consider  allergy profile/ sinus ct and DgEs next  And gabapentin if not tried previously        04/02/2018  f/u ov/Alonda Weaber re:   uacs vs asthma  Chief Complaint  Patient presents with  . Follow-up    Breathing has improved some. She states she uses her albuterol inhaler once daily on average and neb about 2 x per wk.  Dyspnea:  Worse one week prior to OV  Then better s obvious pattern  Cough: globus sensation has been a constant daytime c/o since ? "for a minute" (never responded specifically)  SABA use: once daily  02: none    rec Start gabapentin 100 mg three times daily  Please see patient coordinator before you leave today  to schedule sinus ct and asthma challenge test Hold symbicort x 24 hours prior to the asthma challenge and albuterol for at least 6 hours prior  See [name] NP in 4  weeks with all your medications   04/30/18 NP/ [name] med review rec Begin Delsym 2 tsp Twice daily  For cough As needed   Begin Allegra 180mg  daily in am  Begin Chlorpheniramine 4mg  At bedtime  .  Go ahead and start Gabapentin 100mg  Three times a day   Follow med calendar closely and bring to each visit .  Continue on Symbicort 2 puffs Twice daily  , rinse well after use   06/11/2018  f/u ov/Theotis Gerdeman re:  Cough variant asthma  No  Med calendar but clearly better  / not sure which dose of symb she has or name of new HA pill but hasn't started yet  Chief Complaint  Patient presents with  . Follow-up    She states she feels like her  throat is "full of mucus this morning". She states she has not been clearing her throat much, but she is today. She states she occ produces some clear to yellow sputum in the am's. She is using her proair inhaler 3-4 x per wk and she has not needed neb.  Dyspnea:  100 ft then sob walking but water ex is fine up to an hour  Cough: resolved   Sleeping: L side bed/ 2 pillows/ cpap per [place] SABA use: as above   02: none     No obvious day to day or daytime variability or assoc excess/ purulent sputum or mucus plugs or hemoptysis or cp or chest tightness, subjective wheeze or overt sinus or hb symptoms.   Sleeping as above  without nocturnal  or early am exacerbation  of respiratory  c/o's or need for noct saba. Also denies any obvious fluctuation of symptoms with weather or environmental changes or other aggravating or alleviating factors except as outlined above   No unusual exposure hx or h/o childhood pna/ asthma or knowledge of premature birth.  Current Allergies, Complete Past Medical History, Past Surgical History, Family History, and Social History were reviewed in [employer] record.  ROS  The following are not active complaints unless bolded Hoarseness, sore throat, dysphagia, dental problems, itching, sneezing,  nasal congestion or discharge of excess mucus or purulent secretions, ear ache,   fever, chills, sweats, unintended wt loss or wt gain, classically pleuritic or exertional cp,  orthopnea pnd or arm/hand swelling  or leg swelling, presyncope, palpitations, abdominal pain, anorexia, nausea, vomiting, diarrhea  or change in bowel habits or change in bladder habits, change in stools or change in urine, dysuria, hematuria,  rash, arthralgias, visual complaints, headache worse as day goes on,  numbness, weakness or ataxia or problems with walking or coordination,  change in mood or  memory.        Current Meds-   - NOTE:   Unable to verify as accurately reflecting what pt takes     Medication Sig  . albuterol (PROVENTIL) (2.5 MG/3ML) 0.083% nebulizer solution Take 3 mLs (2.5 mg total) by nebulization every 6 (six) hours as needed for wheezing or shortness of breath.  Marland Kitchen albuterol (VENTOLIN HFA) 108 (90 Base) MCG/ACT inhaler Inhale 1 puff into the lungs every 6 (six) hours as needed for wheezing or shortness of breath.  Marland Kitchen aspirin EC 81 MG tablet Take 1 tablet (81 mg total) by mouth daily.  Marland Kitchen atorvastatin (LIPITOR) 20 MG tablet TAKE 1 TABLET BY MOUTH EVERY DAY  . budesonide-formoterol (SYMBICORT) 80-4.5 MCG/ACT inhaler Inhale 2 puffs into the lungs 2 (two) times daily.  . chlorpheniramine (CHLOR-TRIMETON) 4 MG tablet Take 4 mg by mouth 2 (two) times daily as needed for allergies.  Marland Kitchen dextromethorphan (DELSYM) 30 MG/5ML liquid Take 15 mg by mouth 2 (two) times daily as needed for cough.  . esomeprazole (NEXIUM) 40 MG capsule Take 40 mg by mouth 2 (two) times daily before a meal.  . fexofenadine (ALLEGRA) 180 MG tablet Take 180 mg by mouth daily.  . furosemide (LASIX) 20 MG tablet Take 2 tablets (40 mg total) by mouth daily.  Marland Kitchen gabapentin (NEURONTIN) 100 MG capsule TAKE 1 CAPSULE (100 MG TOTAL) BY MOUTH 3 (THREE) TIMES DAILY.  . isosorbide dinitrate (ISORDIL) 30 MG tablet Take 30 mg by mouth daily.  Marland Kitchen loratadine (CLARITIN) 10 MG tablet Take 10 mg by mouth daily.  Marland Kitchen losartan (COZAAR) 100 MG tablet Take 1  tablet (100 mg total) daily by mouth.  . mirabegron ER (MYRBETRIQ) 50 MG TB24 tablet Take 50 mg by mouth daily.  Marland Kitchen oxybutynin (DITROPAN-XL) 10 MG 24 hr tablet Take 10 mg by mouth at bedtime.  Marland Kitchen oxyCODONE-acetaminophen (PERCOCET/ROXICET) 5-325 MG tablet Take 1 tablet by mouth daily as needed for severe pain.  Marland Kitchen Propylene Glycol (SYSTANE BALANCE) 0.6 % SOLN Apply 1 drop to eye as needed (dry  eyes).  . ticagrelor (BRILINTA) 90 MG TABS tablet Take 1 tablet (90 mg total) by mouth 2 (two) times daily.  Marland Kitchen tiZANidine (ZANAFLEX) 4 MG tablet TAKE 1&1/2 TABLETS DAILY             Objective:   Physical Exam  Obese amb bf, very rare throat clearing   06/11/2018     304  04/02/2018         312  01/04/2018        299  11/30/2017          297   10/25/17 295 lb (133.8 kg)  10/08/17 292 lb (132.5 kg)  09/28/17 290 lb 3.2 oz (131.6 kg)    Vital signs reviewed - Note on arrival 02 sats  99% on RA        HEENT: Top denture/ nl turbinates bilaterally, and oropharynx. Nl external ear canals without cough reflex   NECK :  without JVD/Nodes/TM/ nl carotid upstrokes bilaterally   LUNGS: no acc muscle use,  Nl contour chest which is clear to A and P bilaterally without cough on insp or exp maneuvers   CV:  RRR  no s3 or murmur or increase in P2, and trace to 1+ lower ext sym pitting edema  ABD: obese/ oft and nontender with nl inspiratory excursion in the supine position. No bruits or organomegaly appreciated, bowel sounds nl  MS:  Nl gait/ ext warm without deformities, calf tenderness, cyanosis or clubbing No obvious joint restrictions   SKIN: warm and dry without lesions    NEURO:  alert, approp, nl sensorium with  no motor or cerebellar deficits apparent.           Assessment & Plan:

## 2018-06-12 ENCOUNTER — Telehealth: Payer: Self-pay | Admitting: Internal Medicine

## 2018-06-12 ENCOUNTER — Encounter: Payer: Self-pay | Admitting: Internal Medicine

## 2018-06-12 DIAGNOSIS — M25511 Pain in right shoulder: Secondary | ICD-10-CM | POA: Diagnosis not present

## 2018-06-12 DIAGNOSIS — M25611 Stiffness of right shoulder, not elsewhere classified: Secondary | ICD-10-CM | POA: Diagnosis not present

## 2018-06-12 DIAGNOSIS — J45991 Cough variant asthma: Secondary | ICD-10-CM

## 2018-06-12 DIAGNOSIS — R262 Difficulty in walking, not elsewhere classified: Secondary | ICD-10-CM | POA: Diagnosis not present

## 2018-06-12 DIAGNOSIS — R293 Abnormal posture: Secondary | ICD-10-CM | POA: Diagnosis not present

## 2018-06-12 NOTE — Telephone Encounter (Signed)
Fine with me to take topomax

## 2018-06-12 NOTE — Telephone Encounter (Signed)
Spoke with pt. She was calling to let us know that she is taking Topamax 50mg  daily for headaches. She was also instructed to let us know what strength of Symbicort she is on, this Symbicort 80. As for her BP being high, I advised her to contact her PCP about this. She agreed. Pt is wanting to know if she can start back on the Topamax as she was told while she was in the hospital to stop taking it.  MW - please advise. Thanks.

## 2018-06-12 NOTE — Telephone Encounter (Signed)
lmtcb for pt to make aware of MW's recs.

## 2018-06-12 NOTE — Assessment & Plan Note (Signed)
10/25/2017  After extensive coaching inhaler device  effectiveness =    75% Changed advair dpi to symb 80  2bid  Spirometry 10/25/2017  FEV1 1.30 (63%)  Ratio 85 p am advair 250   - FENO 10/25/2017  =   15 - Allergy profile 10/25/2017 >  Eos 0.1 /  IgE  139 RAST Pos dog/dust / ragweed  -  11/30/2017    try symb 80 2bid -  PFT's  01/04/2018  FEV1 1.68 (82 % ) ratio 89  p 10 % improvement from saba p sym 80 prior to study with DLCO  78/78 % corrects to 118 % for alv volume   -  04/02/2018  After extensive coaching inhaler device  effectiveness =    75% (short Ti)  -  MCT  04/11/2018  Pos > rec increase symb to 160 2bid    Although not clear what dose of symbicort she's using, All goals of chronic asthma control met including optimal function and elimination of symptoms with minimal need for rescue therapy.  Contingencies discussed in full including contacting this office immediately if not controlling the symptoms using the rule of two's.     Therefore rec no change in meds for now but call us to update our records    I had an extended discussion with the patient reviewing all relevant studies completed to date and  lasting 15 to 20 minutes of a 25 minute visit    Each maintenance medication was reviewed in detail including most importantly the difference between maintenance and prns and under what circumstances the prns are to be triggered using an action plan format that is not reflected in the computer generated alphabetically organized AVS but trather by a customized med calendar that reflects the AVS meds with confirmed 100% correlation.   In addition, Please see AVS for unique instructions that I personally wrote and verbalized to the the pt in detail and then reviewed with pt  by my nurse highlighting any  changes in therapy recommended at today's visit to their plan of care.

## 2018-06-12 NOTE — Assessment & Plan Note (Signed)
Speech therapy eval 04/06/17 > neg but did have report of globus then  Try off fosfamax 11/30/2017 >>> - 01/04/2018 added 1st gen H1 blockers per guidelines - Sinus CT 04/03/2018  Neg sinusitis/ retention cyst R max sinus - gabapentin trial 04/02/2018 > improved 06/11/2018   Despite inadequate med reconciliation at this point she's clearly better and I suspect that's due to rx for irritable larynx syndrome so no change rx needed for now   F/u with all meds/ med calendar in 6 weeks

## 2018-06-13 ENCOUNTER — Ambulatory Visit (HOSPITAL_BASED_OUTPATIENT_CLINIC_OR_DEPARTMENT_OTHER): Payer: Medicare Other | Attending: Cardiology | Admitting: Cardiology

## 2018-06-13 VITALS — Ht 67.0 in | Wt 304.0 lb

## 2018-06-13 DIAGNOSIS — J45909 Unspecified asthma, uncomplicated: Secondary | ICD-10-CM | POA: Diagnosis not present

## 2018-06-13 DIAGNOSIS — G4733 Obstructive sleep apnea (adult) (pediatric): Secondary | ICD-10-CM | POA: Insufficient documentation

## 2018-06-13 DIAGNOSIS — J449 Chronic obstructive pulmonary disease, unspecified: Secondary | ICD-10-CM | POA: Diagnosis not present

## 2018-06-13 DIAGNOSIS — J42 Unspecified chronic bronchitis: Secondary | ICD-10-CM | POA: Diagnosis not present

## 2018-06-13 NOTE — Telephone Encounter (Signed)
Spoke with the pt and notified ok per MW to take topamax  She verbalized understanding and nothing further needed

## 2018-06-14 DIAGNOSIS — Z7689 Persons encountering health services in other specified circumstances: Secondary | ICD-10-CM | POA: Diagnosis not present

## 2018-06-16 NOTE — Procedures (Signed)
   Patient Name: Alisha [name], Alisha [name] Date: 06/13/2018 Gender: Female D.O.B: 02/02/1951 Age (years): 66 Referring Provider: Fransico Him MD, ABSM Height (inches): 44 Interpreting Physician: Fransico Him MD, ABSM Weight (lbs): 306 RPSGT: [place], [place] BMI: 49 MRN: [id number] Neck Size: 18.00  CLINICAL INFORMATION  The patient is referred for a CPAP titration to treat sleep apnea.  SLEEP STUDY TECHNIQUE  As per the AASM Manual for the Scoring of Sleep and Associated Events v2.3 (April 2016) with a hypopnea requiring 4% desaturations. The channels recorded and monitored were frontal, central and occipital EEG, electrooculogram (EOG), submentalis EMG (chin), nasal and oral airflow, thoracic and abdominal wall motion, anterior tibialis EMG, snore microphone, electrocardiogram, and pulse oximetry. Continuous positive airway pressure (CPAP) was initiated at the beginning of the study and titrated to treat sleep-disordered breathing.  MEDICATIONS  Medications self-administered by patient taken the night of the study : TIZANIDINE  TECHNICIAN COMMENTS  Comments added by technician: This patient is unable to sleep supine due to pain. Comments added by scorer:   RESPIRATORY PARAMETERS  Optimal PAP Pressure (cm):N/A AHI at Optimal Pressure (/hr):N/A Overall Minimal O2 (%):86.0 Supine % at Optimal Pressure (%):N/A Minimal O2 at Optimal Pressure (%):86.0  SLEEP ARCHITECTURE  The study was initiated at 10:00:41 PM and ended at 5:19:58 AM. Sleep onset time was 0.0 minutes and the sleep efficiency was 88.7%%. The total sleep time was 389.5 minutes. The patient spent 13.3%% of the night in stage N1 sleep, 65.2%% in stage N2 sleep, 3.2%% in stage N3 and 18.2% in REM.Stage REM latency was 212.5 minutes Wake after sleep onset was 49.8. Alpha intrusion was absent. Supine sleep was 0.00%.  CARDIAC DATA  The 2 lead EKG demonstrated sinus rhythm. The mean heart rate was 59.1 beats per minute.  Other EKG findings include: None.  LEG MOVEMENT DATA  The total Periodic Limb Movements of Sleep (PLMS) were 0. The PLMS index was 0.0. A PLMS index of <15 is considered normal in adults.  IMPRESSIONS  - An optimal PAP pressure could not be selected for this patient based on the available study data. - Central sleep apnea was not noted during this titration (CAI = 0.3/h). - Moderate oxygen desaturations were observed during this titration (min O2 = 86.0%). - The patient snored with moderate snoring volume during this titration study. - No cardiac abnormalities were observed during this study. - Clinically significant periodic limb movements were not noted during this study. Arousals associated with PLMs were rare.  DIAGNOSIS  - Obstructive Sleep Apnea (327.23 [G47.33 ICD-10])  RECOMMENDATIONS  - Recommend repeat PAP titration with BiPAP - Avoid alcohol, sedatives and other CNS depressants that may worsen sleep apnea and disrupt normal sleep architecture. - Sleep hygiene should be reviewed to assess factors that may improve sleep quality. - Weight management and regular exercise should be initiated or continued.  [Electronically signed] 06/16/2018 08:19 PM  Fransico Him MD, ABSM Diplomate, American Board of Sleep Medicine

## 2018-06-18 ENCOUNTER — Telehealth: Payer: Self-pay | Admitting: *Deleted

## 2018-06-18 DIAGNOSIS — G4733 Obstructive sleep apnea (adult) (pediatric): Secondary | ICD-10-CM

## 2018-06-18 NOTE — Telephone Encounter (Signed)
Informed patient of sleep study results and patient understanding was verbalized. Patient understands her sleep study showed that due to severity of  OSA she could not be titrated on CPAP and needs repeat study in lab using BiPAP. Pt is aware and agreeable to these results. Bipap titration sent to precert.

## 2018-06-18 NOTE — Telephone Encounter (Signed)
-----   Message from [name], MD sent at 06/16/2018  8:27 PM EDT ----- Please let patient know that due to severity of OSA she could not be titrated on CPAP and needs repeat study in lab using BiPAP

## 2018-06-18 NOTE — Addendum Note (Signed)
Addended by: [name] on: 06/18/2018 02:32 PM   Modules accepted: Orders

## 2018-06-20 ENCOUNTER — Telehealth: Payer: Self-pay | Admitting: *Deleted

## 2018-06-20 NOTE — Telephone Encounter (Signed)
-----   Message from [name], [place] sent at 06/18/2018  2:25 PM EDT ----- Regarding: precert Bipap titration

## 2018-06-20 NOTE — Telephone Encounter (Signed)
Staff message sent to Gae Bon per [facility] web portal no PA required. Ok to schedule. Decision HI:[id number]. 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

## 2018-06-25 NOTE — Telephone Encounter (Signed)
  Lauralee Evener, CMA  [name], CMA        Per [facility] web portal no PA is required. Ok to schedule. Decision RZ:[id number].     ----- Message -----  From: [name], CMA  Sent: 06/18/2018  2:25 PM EDT  To: Cv [facility] Sleep Studies  Subject: precert                      Bipap titration

## 2018-06-25 NOTE — Telephone Encounter (Signed)
Patient is scheduled for lab study on 08/08/18. Patient understands her Bipap study will be done at [facility] sleep lab. Patient understands she will receive a sleep packet in a week or so. Patient understands to call if she does not receive the sleep packet in a timely manner. Patient agrees with treatment and thanked me for call.  

## 2018-07-10 ENCOUNTER — Other Ambulatory Visit: Payer: Self-pay | Admitting: Neurology

## 2018-07-12 DIAGNOSIS — J449 Chronic obstructive pulmonary disease, unspecified: Secondary | ICD-10-CM | POA: Diagnosis not present

## 2018-07-12 DIAGNOSIS — J45909 Unspecified asthma, uncomplicated: Secondary | ICD-10-CM | POA: Diagnosis not present

## 2018-07-12 DIAGNOSIS — J42 Unspecified chronic bronchitis: Secondary | ICD-10-CM | POA: Diagnosis not present

## 2018-07-22 ENCOUNTER — Other Ambulatory Visit: Payer: Self-pay | Admitting: Cardiology

## 2018-07-22 ENCOUNTER — Ambulatory Visit (INDEPENDENT_AMBULATORY_CARE_PROVIDER_SITE_OTHER): Payer: Medicare Other | Admitting: Podiatry

## 2018-07-22 DIAGNOSIS — Q828 Other specified congenital malformations of skin: Secondary | ICD-10-CM

## 2018-07-22 DIAGNOSIS — M79674 Pain in right toe(s): Secondary | ICD-10-CM | POA: Diagnosis not present

## 2018-07-22 DIAGNOSIS — B351 Tinea unguium: Secondary | ICD-10-CM

## 2018-07-22 DIAGNOSIS — M79675 Pain in left toe(s): Secondary | ICD-10-CM | POA: Diagnosis not present

## 2018-07-22 NOTE — Progress Notes (Signed)
Subjective: 67 y.o. returns the office today for painful, elongated, thickened toenails which she cannot trim herself and for a painful callus on the side of her left foot.  She has no other concerns today. Denies any systemic complaints such as fevers, chills, nausea, vomiting.   PCP: Lucious Groves, DO  Objective: AAO 3, NAD DP/PT pulses palpable, CRT less than 3 seconds Nails hypertrophic, dystrophic, elongated, brittle, discolored 10. There is tenderness overlying the nails 1-5 bilaterally. There is no surrounding erythema or drainage along the nail sites. Hyperkeratotic lesion left foot submetatarsal 5.  No underlying ulceration drainage or signs of infection. No other areas of tenderness bilateral lower extremities. No overlying edema, erythema, increased warmth. No pain with calf compression, swelling, warmth, erythema.  Assessment: Patient presents with symptomatic onychomycosis; hyperkeratotic lesion  Plan: -Treatment options including alternatives, risks, complications were discussed -Nails sharply debrided 10 without complication/bleeding. -Hyperkeratotic lesion sharply debrided x1 without any complications or bleeding. -Discussed daily foot inspection. If there are any changes, to call the office immediately.  -Follow-up in 3 months or sooner if any problems are to arise. In the meantime, encouraged to call the office with any questions, concerns, changes symptoms.  Celesta Gentile, DPM

## 2018-07-23 ENCOUNTER — Encounter: Payer: Self-pay | Admitting: Internal Medicine

## 2018-07-23 ENCOUNTER — Ambulatory Visit (INDEPENDENT_AMBULATORY_CARE_PROVIDER_SITE_OTHER): Payer: Medicare Other | Admitting: Internal Medicine

## 2018-07-23 DIAGNOSIS — J45991 Cough variant asthma: Secondary | ICD-10-CM

## 2018-07-23 DIAGNOSIS — R05 Cough: Secondary | ICD-10-CM | POA: Diagnosis not present

## 2018-07-23 DIAGNOSIS — R058 Other specified cough: Secondary | ICD-10-CM

## 2018-07-23 NOTE — Patient Instructions (Addendum)
Change chlorpheniramine to where you take x 2 one hour before  bedtime   For drainage / throat tickle try take CHLORPHENIRAMINE  4 mg - take one every 4 hours as needed - available over the counter- may cause drowsiness so start with just a bedtime dose or two and see how you tolerate it before trying in daytime    Please schedule a follow up office visit in 3 months, call sooner if needed with all medications /inhalers/ solutions in hand so we can verify exactly what you are taking. This includes all medications from all doctors and over the [place] separate them into two bags:  the ones you take automatically, no matter what, vs the ones you take just when you feel you need them "BAG #2 is UP TO YOU"  - this will really help [place] help you take your medications more effectively.

## 2018-07-23 NOTE — Progress Notes (Signed)
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    History of Present Illness  10/25/2017 1st [place] [facility] office visit/ Alisha [name]   On advair x sev years Chief Complaint  Patient presents with  . Pulmonary consult    Dr. Percival Spanish referred pt for SOB, diagnosed with asthma but SOb with exertion for years, dry cough, acid reflux  at hs tends cough and wakes sev times at night x sev years/ on cpap helps some Feels rested and not symptomatic first thing in am then starts the throat clearing and downhill from there the rest of the day Was on ACEi > d/c 05/02/17 and changed to ARB = losartan rec Stop advair and start symbicort 80 Take 2 puffs first thing in am and then another 2 puffs about 12 hours later. Be sure to continue nexium 40 mg Take 30- 60 min before your first and last meals of the day  Only use your albuterol as a rescue medication GERD diet  Please remember to go to the lab and x-ray department downstairs in the basement  for your tests - we will call you with the results when they are available.  Please schedule a follow up office visit in 4 weeks, sooner if needed  with all medications /inhalers/ solutions in hand so we can verify exactly what you are taking. This includes all medications from all doctors and over the counters  add needs cxr on return   11/30/2017  f/u ov/Alisha [name] re:  Cough x 1980 ? Cough variant asthma/ uacs / did not bring meds because thinks   "they are as listed" (they are not)  Chief Complaint  Patient presents with  . Follow-up    SOB is worse then last ov    Dyspnea:  Back stops before breathing so uses scooter when goes out Cough: daytime continuous urge to clear throat Sleep: fine on cpap SABA use:  Only uses nebulizer 3 x week - list says she has proair but pt denies though "that list is right"  On fosfamax x years rec Stop fosfamax Plan A = Automatic =  symbicort 80 Take 2 puffs first thing in am and then another 2 puffs about 12 hours later.  Work on Interior and spatial designer:   Plan B = Backup Only use your albuterol nebulizer as a rescue medication to be used if you can't catch your breath by resting or doing a relaxed purse lip breathing pattern.  - The less you use it, the better it will work when you need it. - Ok to use the inhaler up to    every 4 hours if you must but call for appointment if use goes up over your usual need  GERD es. For drainage / throat tickle try take CHLORPHENIRAMINE  4 mg - take one every 4 hours as needed - available over the counter- may cause drowsiness so start  with just a bedtime dose or two and see how you tolerate it before trying in daytime    Please remember to go to the  x-ray department downstairs in the basement  for your tests - we will call you with the results when they are available.    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 Follow-up    PFT's done today. Breathing is unchanged. She is still clearing her throat alot. She is using her albuterol neb 2-3 x per wk.   Dyspnea: scooter for back pain > sob  Cough: constant throat clearing  Sleep: cough keeps her up despite 2 pillows, not using 1st gen H1 blockers per guidelines   SABA use:   2- 3 x per week but doesn't help cough  rec Work on inhaler technique:  relax and gently blow all the way out then take a nice smooth deep  breath back in, triggering the inhaler at same time you start breathing in.  Hold for up to 5 seconds if you can. Blow out thru nose. Rinse and gargle with water when done    Please schedule a follow up office visit in 6 weeks, call sooner if needed with all medications /inhalers/ solutions in hand so we can verify exactly what you are taking. This includes all medications from all doctors and over the [place] separate them into two bags:  the ones you take automatically, no matter what, vs the ones you take just when you feel you need them "BAG #2 is UP TO YOU"  - this will really help [place] help you take your medications more effectively.  - consider  allergy profile/ sinus ct and DgEs next  And gabapentin if not tried previously        04/02/2018  f/u ov/Alisha [name] re:   uacs vs asthma  Chief Complaint  Patient presents with  . Follow-up    Breathing has improved some. She states she uses her albuterol inhaler once daily on average and neb about 2 x per wk.  Dyspnea:  Worse one week prior to OV  Then better s obvious pattern  Cough: globus sensation has been a constant daytime c/o since ? "for a minute" (never responded specifically)  SABA use: once daily  02: none    rec Start gabapentin 100 mg three times daily  Hold symbicort x 24 hours prior to the asthma challenge and albuterol for at least 6 hours prior  Rec: MCT  04/11/2018  Pos > rec increase symb to 160 2bid   04/30/18 NP/ [name] med review rec Begin Delsym 2 tsp Twice daily  For cough As needed   Begin Allegra 180mg  daily in am  Begin Chlorpheniramine 4mg  At bedtime  .  Go ahead and start Gabapentin 100mg  Three times a day   Follow med calendar closely and bring to each visit .  Continue on Symbicort 2 puffs Twice daily  , rinse well after use   06/11/2018  f/u ov/Alisha [name] re:  Cough variant asthma  No  Med calendar but clearly better / not sure which dose of symb she has or name of new HA pill but hasn't started yet  Chief  Complaint  Patient presents with  . Follow-up    She states she feels like her throat is "full of mucus this morning". She states she has not been clearing her  throat much, but she is today. She states she occ produces some clear to yellow sputum in the am's. She is using her proair inhaler 3-4 x per wk and she has not needed neb.  Dyspnea:  100 ft then sob walking but water ex is fine up to an hour  Cough: resolved   Sleeping: L side bed/ 2 pillows/ cpap per [place] SABA use: as above   02: none   rec No change in medicatoins - follow the med calendar as written  Call us with name of headache medication and strength of your symbicort  Please schedule a follow up office visit in 6 weeks, call sooner if needed with all medications /inhalers/ solutions in hand    07/23/2018  f/u ov/Alisha [name] re: cough variant asthma, did not bring meds  Chief Complaint  Patient presents with  . Follow-up    Increased cough x 2 wks- non prod. She has some sinus pressure, HA and nasal dryness. She is using her albuterol inhaler 1 to 2 x per wk. She uses her neb about once per month.   Dyspnea:  Walking with cane across office Cough: worse x 2 weeks/ dry day > noct  Sleeping: on cpap on side / flat bed and 2 pillows  SABA use: min  02: none    No obvious day to day or daytime variability or assoc excess/ purulent sputum or mucus plugs or hemoptysis or cp or chest tightness, subjective wheeze or overt sinus or hb symptoms.   Sleeping as above  without nocturnal  or early am exacerbation  of respiratory  c/o's or need for noct saba. Also denies any obvious fluctuation of symptoms with weather or environmental changes or other aggravating or alleviating factors except as outlined above   No unusual exposure hx or h/o childhood pna/ asthma or knowledge of premature birth.  Current Allergies, Complete Past Medical History, Past Surgical History, Family History, and Social History were reviewed in [facility] record.  ROS  The following are not active complaints unless bolded Hoarseness, sore throat, dysphagia, dental problems, itching, sneezing,  nasal congestion or discharge of excess mucus or purulent secretions, ear ache,   fever, chills, sweats, unintended wt loss or wt gain, classically pleuritic or exertional cp,  orthopnea pnd or arm/hand swelling  or leg swelling, presyncope, palpitations, abdominal pain, anorexia, nausea, vomiting, diarrhea  or change in bowel habits or change in bladder habits, change in stools or change in urine, dysuria, hematuria,  rash, arthralgias, visual complaints, headache, numbness, weakness or ataxia or problems with walking or coordination,  change in mood or  memory.        Current Meds  Medication Sig  . albuterol (PROVENTIL) (2.5 MG/3ML) 0.083% nebulizer solution Take 3 mLs (2.5 mg total) by nebulization every 6 (six) hours as needed for wheezing or shortness of breath.  Marland Kitchen albuterol (VENTOLIN HFA) 108 (90 Base) MCG/ACT inhaler Inhale 1 puff into the lungs every 6 (six) hours as needed for wheezing or shortness of breath.  Marland Kitchen aspirin 81 MG EC tablet TAKE 1 TABLET BY MOUTH EVERY DAY  . aspirin EC 81 MG tablet Take 1 tablet (81 mg total) by mouth daily.  Marland Kitchen atorvastatin (LIPITOR) 20 MG tablet TAKE 1 TABLET BY MOUTH EVERY DAY  . budesonide-formoterol (SYMBICORT) 80-4.5 MCG/ACT inhaler Inhale 2 puffs into the lungs 2 (two) times daily.  . chlorpheniramine (CHLOR-TRIMETON) 4 MG tablet Take 4 mg by mouth 2 (two) times daily as needed  for allergies.  Marland Kitchen dextromethorphan (DELSYM) 30 MG/5ML liquid Take 15 mg by mouth 2 (two) times daily as needed for cough.  . DULoxetine (CYMBALTA) 60 MG capsule Take 1 capsule (60 mg total) by mouth daily.  Marland Kitchen esomeprazole (NEXIUM) 40 MG capsule Take 40 mg by mouth 2 (two) times daily before a meal.  . fexofenadine (ALLEGRA) 180 MG tablet Take 180 mg by mouth daily.  . furosemide (LASIX) 20 MG tablet Take 2 tablets (40 mg  total) by mouth daily.  Marland Kitchen gabapentin (NEURONTIN) 100 MG capsule TAKE 1 CAPSULE (100 MG TOTAL) BY MOUTH 3 (THREE) TIMES DAILY.  . isosorbide dinitrate (ISORDIL) 30 MG tablet Take 30 mg by mouth daily.  Marland Kitchen loratadine (CLARITIN) 10 MG tablet Take 10 mg by mouth daily.  Marland Kitchen losartan (COZAAR) 100 MG tablet Take 1 tablet (100 mg total) daily by mouth.  . mirabegron ER (MYRBETRIQ) 50 MG TB24 tablet Take 50 mg by mouth daily.  Marland Kitchen morphine (MS CONTIN) 15 MG 12 hr tablet Take 1 tablet (15 mg total) by mouth daily as needed for pain.  Marland Kitchen oxybutynin (DITROPAN-XL) 10 MG 24 hr tablet Take 10 mg by mouth at bedtime.  Marland Kitchen oxyCODONE-acetaminophen (PERCOCET/ROXICET) 5-325 MG tablet Take 1 tablet by mouth daily as needed for severe pain.  Marland Kitchen Propylene Glycol (SYSTANE BALANCE) 0.6 % SOLN Apply 1 drop to eye as needed (dry eyes).  . ticagrelor (BRILINTA) 90 MG TABS tablet Take 1 tablet (90 mg total) by mouth 2 (two) times daily.  Marland Kitchen tiZANidine (ZANAFLEX) 4 MG tablet TAKE 1&1/2 TABLETS DAILY              Objective:   Physical Exam   obese bf arrived on scooter/ occ throat clearing    07/23/2018     312  06/11/2018     304  04/02/2018         312  01/04/2018        299  11/30/2017          297   10/25/17 295 lb (133.8 kg)  10/08/17 292 lb (132.5 kg)  09/28/17 290 lb 3.2 oz (131.6 kg)    Vital signs reviewed - Note on arrival 02 sats  98% on RA       HEENT:  Top dentures/ nl  turbinates bilaterally, and oropharynx. Nl external ear canals without cough reflex   NECK :  without JVD/Nodes/TM/ nl carotid upstrokes bilaterally   LUNGS: no acc muscle use,  Nl contour chest which is clear to A and P bilaterally without cough on insp or exp maneuvers   CV:  RRR  no s3 or murmur or increase in P2, and 1+ pitting both LE's  ABD: Obese  soft and nontender with limited  inspiratory excursion.  No bruits or organomegaly appreciated, bowel sounds nl  MS:  Nl gait/ ext warm without deformities, calf tenderness, cyanosis  or clubbing No obvious joint restrictions   SKIN: warm and dry without lesions    NEURO:  alert, approp, nl sensorium with  no motor or cerebellar deficits apparent.        Assessment & Plan:

## 2018-07-26 ENCOUNTER — Encounter: Payer: Self-pay | Admitting: Internal Medicine

## 2018-07-26 NOTE — Assessment & Plan Note (Signed)
Body mass index is 48.9 kg/m.  -  trending up  Lab Results  Component Value Date   TSH 1.860 04/11/2018     Contributing to gerd risk/ doe/reviewed the need and the process to achieve and maintain neg calorie balance > defer f/u primary care including intermittently monitoring thyroid status

## 2018-07-26 NOTE — Assessment & Plan Note (Signed)
10/25/2017  After extensive coaching inhaler device  effectiveness =    75% Changed advair dpi to symb 80  2bid  Spirometry 10/25/2017  FEV1 1.30 (63%)  Ratio 85 p am advair 250   - FENO 10/25/2017  =   15 - Allergy profile 10/25/2017 >  Eos 0.1 /  IgE  139 RAST Pos dog/dust / ragweed  -  11/30/2017    try symb 80 2bid -  PFT's  01/04/2018  FEV1 1.68 (82 % ) ratio 89  p 10 % improvement from saba p sym 80 prior to study with DLCO  78/78 % corrects to 118 % for alv volume     -  MCT  04/11/2018  Pos > rec increase symb to 160 2bid  -  06/12/2018 informed pt actually  on symb 80 2bid but doing well enough to continue as is  07/23/2018  After extensive coaching inhaler device,  effectiveness =    75%   Despite suboptimal hfa, really All goals of chronic asthma control met including optimal function and elimination of symptoms with minimal need for rescue therapy.  Contingencies discussed in full including contacting this office immediately if not controlling the symptoms using the rule of two's.      I had an extended discussion with the patient reviewing all relevant studies completed to date and  lasting 15 to 20 minutes of a 25 minute visit    See device teaching which extended face to face time for this visit.  Each maintenance medication was reviewed in detail including emphasizing most importantly the difference between maintenance and prns and under what circumstances the prns are to be triggered using an action plan format that is not reflected in the computer generated alphabetically organized AVS which I have not found useful in most complex patients, especially with respiratory illnesses  Please see AVS for specific instructions unique to this visit that I personally wrote and verbalized to the the pt in detail and then reviewed with pt  by my nurse highlighting any  changes in therapy recommended at today's visit to their plan of care.

## 2018-07-26 NOTE — Assessment & Plan Note (Signed)
Speech therapy eval 04/06/17 > neg but did have report of globus then  Try off fosfamax 11/30/2017 >>> - 01/04/2018 added 1st gen H1 blockers per guidelines - Sinus CT 04/03/2018  Neg sinusitis/ retention cyst R max sinus - gabapentin trial 04/02/2018 > improved 06/11/2018     Overall improved though more sense of pnds last few weeks > increased cough so rec 1st gen H1 blockers per guidelines

## 2018-08-08 ENCOUNTER — Ambulatory Visit (HOSPITAL_BASED_OUTPATIENT_CLINIC_OR_DEPARTMENT_OTHER): Payer: Medicare Other | Attending: Cardiology | Admitting: Cardiology

## 2018-08-08 VITALS — Ht 66.0 in | Wt 311.0 lb

## 2018-08-08 DIAGNOSIS — G4733 Obstructive sleep apnea (adult) (pediatric): Secondary | ICD-10-CM | POA: Diagnosis not present

## 2018-08-14 NOTE — Procedures (Signed)
   Patient Name: Alisha, [name] Date: 08/08/2018 Gender: Female D.O.B: 1951/02/19 Age (years): 52 Referring Provider: Fransico Him MD, ABSM Height (inches): 68 Interpreting Physician: Fransico Him MD, ABSM Weight (lbs): 306 RPSGT: Lanae Boast BMI: 49 MRN: [id number] Neck Size: 18.00  CLINICAL INFORMATION The patient is referred for a BiPAP titration to treat sleep apnea.  SLEEP STUDY TECHNIQUE As per the AASM Manual for the Scoring of Sleep and Associated Events v2.3 (April 2016) with a hypopnea requiring 4% desaturations.  The channels recorded and monitored were frontal, central and occipital EEG, electrooculogram (EOG), submentalis EMG (chin), nasal and oral airflow, thoracic and abdominal wall motion, anterior tibialis EMG, snore microphone, electrocardiogram, and pulse oximetry. Bilevel positive airway pressure (BPAP) was initiated at the beginning of the study and titrated to treat sleep-disordered breathing.  MEDICATIONS Medications self-administered by patient taken the night of the study : TIZANIDINE  RESPIRATORY PARAMETERS Optimal IPAP Pressure (cm): 12  AHI at Optimal Pressure (/hr) 0.0 Optimal EPAP Pressure (cm):8  Overall Minimal O2 (%):86.0  Minimal O2 at Optimal Pressure (%): 93.0  SLEEP ARCHITECTURE Start Time:10:57:25 PM  Stop Time:6:32:42 AM  Total Time (min):455.3  Total Sleep Time (min):385.6 Sleep Latency (min):1.2  Sleep Efficiency (%):84.7%  REM Latency (min):157.0  WASO (min):68.5 Stage N1 (%): 21.5%  Stage N2 (%): 67.0%  Stage N3 (%): 0.0%  Stage R (%):11.4 Supine (%):63.56  Arousal Index (/hr):13.4   CARDIAC DATA The 2 lead EKG demonstrated sinus rhythm. The mean heart rate was 61.1 beats per minute. Other EKG findings include: None.  LEG MOVEMENT DATA The total Periodic Limb Movements of Sleep (PLMS) were 0. The PLMS index was 0.0. A PLMS index of <15 is considered normal in adults.  IMPRESSIONS - An optimal PAP  pressure was selected for this patient ( 12 / cm of water) - Central sleep apnea was not noted during this titration (CAI = 0.0/h). - Moderete oxygen desaturations were observed during this titration (min O2 = 86.0%). - The patient snored with soft snoring volume. - No cardiac abnormalities were observed during this study. - Clinically significant periodic limb movements were not noted during this study. Arousals associated with PLMs were rare.  DIAGNOSIS - Obstructive Sleep Apnea (327.23 [G47.33 ICD-10])  RECOMMENDATIONS - Trial of BiPAP therapy on 12/8 cm H2O with a Medium size Resmed Full Face Mask AirFit F20 mask and heated humidification. - Avoid alcohol, sedatives and other CNS depressants that may worsen sleep apnea and disrupt normal sleep architecture. - Sleep hygiene should be reviewed to assess factors that may improve sleep quality. - Weight management and regular exercise should be initiated or continued. - Return to Sleep Center for re-evaluation after 10 weeks of therapy  [Electronically signed] 08/14/2018 04:14 PM  Fransico Him MD, ABSM Diplomate, American Board of Sleep Medicine

## 2018-08-19 NOTE — Telephone Encounter (Addendum)
  [name], MD  [name], CMA        Please let patient know that they had a successful PAP titration and let DME know that orders are in [facility]. Please set up 10 week OV with me.    Informed patient of sleep study results and patient understanding was verbalized. Patient understands her sleep study showed  they had a successful PAP titration and let DME know that orders are in [facility]. Please set up 10 week OV with me.   Upon patient request DME selection is [facility]. Patient understands she will be contacted by [facility] to set up her cpap. Patient understands to call if [facility] does not contact her with new setup in a timely manner. Patient understands they will be called once confirmation has been received from [place] that they have received their new machine to schedule 10 week follow up appointment.  [facility]  notified of new cpap order  Please add to airview Patient was grateful for the call and thanked me.

## 2018-08-27 DIAGNOSIS — J42 Unspecified chronic bronchitis: Secondary | ICD-10-CM | POA: Diagnosis not present

## 2018-08-27 DIAGNOSIS — J449 Chronic obstructive pulmonary disease, unspecified: Secondary | ICD-10-CM | POA: Diagnosis not present

## 2018-08-27 DIAGNOSIS — J45909 Unspecified asthma, uncomplicated: Secondary | ICD-10-CM | POA: Diagnosis not present

## 2018-08-29 NOTE — Progress Notes (Deleted)
NEUROLOGY FOLLOW UP OFFICE NOTE  Arihana Ambrocio [id number]  HISTORY OF PRESENT ILLNESS: Marily Konczal is a 68 year old right-handed female with asthma, CAD, OSA, fibromyalgia, hypertension and depression who follows up for migraines and pituitary adenoma.  UPDATE: Intensity:  *** Duration:  *** Frequency:  *** Frequency of abortive medication: *** Current NSAIDS: Flurbiprofen 50 mg Current analgesics: MS Contin (for chronic pain), Percocet (rarely for chronic pain) Current triptans: None Current ergotamine: None Current anti-emetic: None Current muscle relaxants: Tizanidine 6 mg at bedtime Current anti-anxiolytic: None Current sleep aide: None Current Antihypertensive medications: Toprol, Lasix, lisinopril Current Antidepressant medications: Cymbalta 60 mg Current Anticonvulsant medications: Topiramate 50 mg at bedtime Current anti-CGRP:  *** Current Vitamins/Herbal/Supplements:  *** Current Antihistamines/Decongestants:  *** Other therapy:  *** Other medication:  ***  Caffeine:  *** Alcohol:  *** Smoker:  *** Diet:  *** Exercise:  *** Depression:  ***; Anxiety:  *** Other pain:  *** Sleep hygiene:  ***  HISTORY: In the mid-1990s, she began experiencing headaches.  She reported difficulty with seeing the eye chart on physical exams.  In 1999, she was found to have peripheral vision loss by an eye doctor.  She had an MRI of the brain which revealed a pituitary mass that was compressing the optic nerve.  Biopsy confirmed it to be benign.  She underwent 3 surgeries, including transphenoidal surgery.  She underwent radiation in 2014.  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  She would periodically have repeat MRI of brain performed and regular visual field testing.  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    She has migraine headaches.  They are "15"/10 intensity, pounding right-sided or bi-temporal and occipital headache, associated with nausea, photophobia and phonophobia.  They last 1 to 2 days and occur about 4 or 5 times a year.  Stress is a trigger.  Maxalt relieves it.  If Maxalt doesn't work, she goes to the ED.  She also has dull daily headaches as well.  She also has history of vertigo and falls.  She has fibromyalgia and depression, and takes multiple medications, including chronic opioid use.  Past medication:  Amitriptyline (side effects), sertraline 25mg , amlodipine, rizatriptan 10mg  (ineffective and aggravating), Tylenol, naproxen, ibuprofen  To further evaluate right temporal headache and right sided vision loss, sed rate from August 2018 was 24.  She was referred to ophthalmology for visual field testing.  She was evaluated by Dr. Kathlen Mody at [facility] in September 2018.  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  She did not follow up with me as scheduled.  PAST MEDICAL HISTORY: Past Medical History:  Diagnosis Date  . Arthritis   . Asthma   . Bipolar disorder ([place])   . CAD (coronary artery disease) 07/07/2016   a. s/p stenting x 2 in [place] to the LAD, RCA is chronically occluded, LVEDP was 34  . Cancer ([facility])    cervical  . Depression   . Fibromyalgia   . GERD (gastroesophageal reflux disease)   . History of benign pituitary tumor   . History of kidney stones    1972  . Hyperlipidemia   . Hypertension   . Migraine   . Morbid obesity ([place])   .  Peripheral vascular disease ([place])   . Pneumonia   . Seizures ([place])    during surgery for pituitary tumor  . Sleep apnea    with cpap  . Vertigo     MEDICATIONS: Current Outpatient Medications on File Prior to Visit    Medication Sig Dispense Refill  . albuterol (PROVENTIL) (2.5 MG/3ML) 0.083% nebulizer solution Take 3 mLs (2.5 mg total) by nebulization every 6 (six) hours as needed for wheezing or shortness of breath. 150 mL 1  . albuterol (VENTOLIN HFA) 108 (90 Base) MCG/ACT inhaler Inhale 1 puff into the lungs every 6 (six) hours as needed for wheezing or shortness of breath.    Marland Kitchen aspirin 81 MG EC tablet TAKE 1 TABLET BY MOUTH EVERY DAY 90 tablet 2  . aspirin EC 81 MG tablet Take 1 tablet (81 mg total) by mouth daily. 90 tablet 3  . atorvastatin (LIPITOR) 20 MG tablet TAKE 1 TABLET BY MOUTH EVERY DAY 90 tablet 3  . budesonide-formoterol (SYMBICORT) 80-4.5 MCG/ACT inhaler Inhale 2 puffs into the lungs 2 (two) times daily. 1 Inhaler 11  . chlorpheniramine (CHLOR-TRIMETON) 4 MG tablet Take 4 mg by mouth 2 (two) times daily as needed for allergies.    Marland Kitchen dextromethorphan (DELSYM) 30 MG/5ML liquid Take 15 mg by mouth 2 (two) times daily as needed for cough.    . DULoxetine (CYMBALTA) 60 MG capsule Take 1 capsule (60 mg total) by mouth daily. 90 capsule 3  . esomeprazole (NEXIUM) 40 MG capsule Take 40 mg by mouth 2 (two) times daily before a meal.    . fexofenadine (ALLEGRA) 180 MG tablet Take 180 mg by mouth daily.    . furosemide (LASIX) 20 MG tablet Take 2 tablets (40 mg total) by mouth daily. 180 tablet 3  . gabapentin (NEURONTIN) 100 MG capsule TAKE 1 CAPSULE (100 MG TOTAL) BY MOUTH 3 (THREE) TIMES DAILY. 270 capsule 1  . isosorbide dinitrate (ISORDIL) 30 MG tablet Take 30 mg by mouth daily.    Marland Kitchen loratadine (CLARITIN) 10 MG tablet Take 10 mg by mouth daily.  3  . losartan (COZAAR) 100 MG tablet Take 1 tablet (100 mg total) daily by mouth. 90 tablet 0  . mirabegron ER (MYRBETRIQ) 50 MG TB24 tablet Take 50 mg by mouth daily.    Marland Kitchen morphine (MS CONTIN) 15 MG 12 hr tablet Take 1 tablet (15 mg total) by mouth daily as needed for pain. 15 tablet 0  . oxybutynin (DITROPAN-XL) 10 MG 24 hr tablet Take 10 mg by mouth at  bedtime.    Marland Kitchen oxyCODONE-acetaminophen (PERCOCET/ROXICET) 5-325 MG tablet Take 1 tablet by mouth daily as needed for severe pain.    Marland Kitchen Propylene Glycol (SYSTANE BALANCE) 0.6 % SOLN Apply 1 drop to eye as needed (dry eyes).    . ticagrelor (BRILINTA) 90 MG TABS tablet Take 1 tablet (90 mg total) by mouth 2 (two) times daily. 180 tablet 2  . tiZANidine (ZANAFLEX) 4 MG tablet TAKE 1&1/2 TABLETS DAILY  3   No current facility-administered medications on file prior to visit.     ALLERGIES: Allergies  Allergen Reactions  . Ibuprofen     Other reaction(s): Upset Stomach  . Ampicillin Nausea Only  . Asa [Aspirin] Diarrhea    Other reaction(s): Upset Stomach  . Darvon [Propoxyphene] Nausea And Vomiting  . Demeclocycline Other (See Comments)    Nerves feeling  . Eggs Or Egg-Derived Products Diarrhea  . Lactalbumin Diarrhea  . Lisinopril Cough  . Milk-Related  Compounds Diarrhea  . Salicylates Other (See Comments)    Unknown Unknown   . Tetracycline Hcl Hives and Nausea Only  . Tetracyclines & Related Other (See Comments)    Nerves feeling     FAMILY HISTORY: Family History  Problem Relation Age of Onset  . Heart disease Mother 22       CABG, pacemaker, valve  . Cancer Father   . Heart disease Sister        Fluid around the heart  . Breast cancer Neg Hx    ***.  SOCIAL HISTORY: Social History   Socioeconomic History  . Marital status: Divorced    Spouse name: Not on file  . Number of children: Not on file  . Years of education: Not on file  . Highest education level: Not on file  Occupational History  . Not on file  Social Needs  . Financial resource strain: Not on file  . Food insecurity:    Worry: Not on file    Inability: Not on file  . Transportation needs:    Medical: Not on file    Non-medical: Not on file  Tobacco Use  . Smoking status: Never Smoker  . Smokeless tobacco: Never Used  Substance and Sexual Activity  . Alcohol use: Yes    Comment: Socially.  [name].  . Drug use: No  . Sexual activity: Not Currently  Lifestyle  . Physical activity:    Days per week: Not on file    Minutes per session: Not on file  . Stress: Not on file  Relationships  . Social connections:    Talks on phone: Not on file    Gets together: Not on file    Attends religious service: Not on file    Active member of club or organization: Not on file    Attends meetings of clubs or organizations: Not on file    Relationship status: Not on file  . Intimate partner violence:    Fear of current or ex partner: Not on file    Emotionally abused: Not on file    Physically abused: Not on file    Forced sexual activity: Not on file  Other Topics Concern  . Not on file  Social History Narrative   Current Social History        Who lives at home: Lives with youngest daughter, "Olivia Mackie" 09/25/2016    Transportation: Public 3/50/0938   Important Relationships & Pets: 2 daughters (52, 67), 1 son (78), 10 grandchildren, 4 great grandchildren 09/25/2016    Current Stressors: Recently moved from [place], [place] due to threat of NH placement 09/25/2016   Religious / Personal Beliefs: Christian 09/25/2016   Interests / Fun: Dancing 09/25/2016   Other: Participates in [employer] 09/25/2016    REVIEW OF SYSTEMS: Constitutional: No fevers, chills, or sweats, no generalized fatigue, change in appetite Eyes: No visual changes, double vision, eye pain Ear, nose and throat: No hearing loss, ear pain, nasal congestion, sore throat Cardiovascular: No chest pain, palpitations Respiratory:  No shortness of breath at rest or with exertion, wheezes GastrointestinaI: No nausea, vomiting, diarrhea, abdominal pain, fecal incontinence Genitourinary:  No dysuria, urinary retention or frequency Musculoskeletal:  No neck pain, back pain Integumentary: No rash, pruritus, skin lesions Neurological: as above Psychiatric: No depression, insomnia, anxiety Endocrine: No palpitations, fatigue,  diaphoresis, mood swings, change in appetite, change in weight, increased thirst Hematologic/Lymphatic:  No purpura, petechiae. Allergic/Immunologic: no itchy/runny eyes, nasal congestion, recent allergic reactions, rashes  PHYSICAL EXAM: *** General: No acute distress.  Patient appears ***-groomed.  *** body habitus. Head:  Normocephalic/atraumatic Eyes:  Fundi examined but not visualized Neck: supple, no paraspinal tenderness, full range of motion Heart:  Regular rate and rhythm Lungs:  Clear to auscultation bilaterally Back: No paraspinal tenderness Neurological Exam: alert and oriented to person, place, and time. Attention span and concentration intact, recent and remote memory intact, fund of knowledge intact.  Speech fluent and not dysarthric, language intact.  CN II-XII intact. Bulk and tone normal, muscle strength 5/5 throughout.  Sensation to light touch, temperature and vibration intact.  Deep tendon reflexes 2+ throughout, toes downgoing.  Finger to nose and heel to shin testing intact.  Gait normal, Romberg negative.  IMPRESSION: ***  PLAN: ***  Metta Clines, DO  CC: ***

## 2018-09-02 ENCOUNTER — Ambulatory Visit: Payer: Medicare Other | Admitting: Neurology

## 2018-09-15 NOTE — Progress Notes (Signed)
NEUROLOGY FOLLOW UP OFFICE NOTE  Alisha [name] [id number]  HISTORY OF PRESENT ILLNESS: Alisha [name] is a 68 year old right-handed female with asthma, CAD, OSA, fibromyalgia, hypertension and depression who follows up for migraines and pituitary adenoma.  UPDATE: MRI of brain with and without contrast from 04/25/18 was personally reviewed and again demonstrated multiple post-surgical changes for pituitary adenoma and residual tumor in left cavernous sinus stable compared to prior imaging from March 2018.  She didn't follow up with ophthalmology.  She reports increased falls.  Has dull underlying headache but migraines: Intensity:  Moderate to severe Duration:  Several hours Frequency:  No migraines over past month. Frequency of abortive medication: rarely Current NSAIDS: was prescribed flurbiprofen 50 mg but never picked it up. Current analgesics: Percocet (rarely for chronic pain) Current triptans: None Current ergotamine: None Current anti-emetic: None Current muscle relaxants: Tizanidine 6 mg at bedtime (for spasms in the temple) Current anti-anxiolytic: None Current sleep aide: None Current Antihypertensive medications: Toprol, Lasix, losartan Current Antidepressant medications: Cymbalta 60 mg Current Anticonvulsant medications: Topiramate 50 mg at bedtime Current anti-CGRP: None Current Vitamins/Herbal/Supplements: None Current Antihistamines/Decongestants: None Other therapy: None  Depression: No; Anxiety: No Other pain: Diffuse pain Sleep hygiene:  Recent sleep study identified OSA.    HISTORY: In the mid-1990s, she began experiencing headaches.  She reported difficulty with seeing the eye chart on physical exams.  In 1999, she was found to have peripheral vision loss by an eye doctor.  She had an MRI of the brain which revealed a pituitary mass that was compressing the optic nerve.  Biopsy confirmed it to be benign.  She underwent 3 surgeries, including  transphenoidal surgery.  She underwent radiation in 2014.  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  She would periodically have repeat MRI of brain performed and regular visual field testing.  MRI of brain and pituitary with and without contrast from 11/13/16 was personally reviewed and revealed chronic post-surgical right frontal and temporal encephalomalcia and previous resection of pituitary mass without residual or recurrent tumor.    She has migraine headaches.  They are "15"/10 intensity, pounding right-sided or bi-temporal and occipital headache, associated with nausea, photophobia and phonophobia.  They last 1 to 2 days and occur about 4 or 5 times a year.  Stress is a trigger.  Maxalt relieves it.  If Maxalt doesn't work, she goes to the ED.  She also has dull daily headaches as well.  She also has history of vertigo and falls.  She has fibromyalgia and depression, and takes multiple medications, including chronic opioid use.  Past medication:  Amitriptyline (side effects), sertraline 25mg , amlodipine, rizatriptan 10mg  (ineffective and aggravating), Tylenol, naproxen, ibuprofen  To further evaluate right temporal headache and right sided vision loss, sed rate from August 2018 was 24.  She was referred to ophthalmology for visual field testing.  She was evaluated by Dr. Kathlen Mody at [facility] in September 2018.  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  She did not follow up with me as scheduled.  PAST MEDICAL HISTORY: Past Medical History:  Diagnosis Date  . Arthritis   . Asthma   . Bipolar disorder ([place])   . CAD (coronary artery disease) 07/07/2016   a. s/p stenting x 2 in [place] to the LAD, RCA is chronically occluded, LVEDP was 34  . Cancer ([place])    cervical  .  Depression   . Fibromyalgia   . GERD (gastroesophageal reflux  disease)   . History of benign pituitary tumor   . History of kidney stones    1972  . Hyperlipidemia   . Hypertension   . Migraine   . Morbid obesity ([place])   . Peripheral vascular disease ([place])   . Pneumonia   . Seizures ([place])    during surgery for pituitary tumor  . Sleep apnea    with cpap  . Vertigo     MEDICATIONS: Current Outpatient Medications on File Prior to Visit  Medication Sig Dispense Refill  . albuterol (PROVENTIL) (2.5 MG/3ML) 0.083% nebulizer solution Take 3 mLs (2.5 mg total) by nebulization every 6 (six) hours as needed for wheezing or shortness of breath. 150 mL 1  . albuterol (VENTOLIN HFA) 108 (90 Base) MCG/ACT inhaler Inhale 1 puff into the lungs every 6 (six) hours as needed for wheezing or shortness of breath.    Marland Kitchen aspirin 81 MG EC tablet TAKE 1 TABLET BY MOUTH EVERY DAY 90 tablet 2  . aspirin EC 81 MG tablet Take 1 tablet (81 mg total) by mouth daily. 90 tablet 3  . atorvastatin (LIPITOR) 20 MG tablet TAKE 1 TABLET BY MOUTH EVERY DAY 90 tablet 3  . budesonide-formoterol (SYMBICORT) 80-4.5 MCG/ACT inhaler Inhale 2 puffs into the lungs 2 (two) times daily. 1 Inhaler 11  . chlorpheniramine (CHLOR-TRIMETON) 4 MG tablet Take 4 mg by mouth 2 (two) times daily as needed for allergies.    Marland Kitchen dextromethorphan (DELSYM) 30 MG/5ML liquid Take 15 mg by mouth 2 (two) times daily as needed for cough.    . DULoxetine (CYMBALTA) 60 MG capsule Take 1 capsule (60 mg total) by mouth daily. 90 capsule 3  . esomeprazole (NEXIUM) 40 MG capsule Take 40 mg by mouth 2 (two) times daily before a meal.    . fexofenadine (ALLEGRA) 180 MG tablet Take 180 mg by mouth daily.    . furosemide (LASIX) 20 MG tablet Take 2 tablets (40 mg total) by mouth daily. 180 tablet 3  . gabapentin (NEURONTIN) 100 MG capsule TAKE 1 CAPSULE (100 MG TOTAL) BY MOUTH 3 (THREE) TIMES DAILY. 270 capsule 1  . isosorbide dinitrate (ISORDIL) 30 MG tablet Take 30 mg by mouth daily.    Marland Kitchen loratadine (CLARITIN) 10 MG  tablet Take 10 mg by mouth daily.  3  . losartan (COZAAR) 100 MG tablet Take 1 tablet (100 mg total) daily by mouth. 90 tablet 0  . mirabegron ER (MYRBETRIQ) 50 MG TB24 tablet Take 50 mg by mouth daily.    Marland Kitchen morphine (MS CONTIN) 15 MG 12 hr tablet Take 1 tablet (15 mg total) by mouth daily as needed for pain. 15 tablet 0  . oxybutynin (DITROPAN-XL) 10 MG 24 hr tablet Take 10 mg by mouth at bedtime.    Marland Kitchen oxyCODONE-acetaminophen (PERCOCET/ROXICET) 5-325 MG tablet Take 1 tablet by mouth daily as needed for severe pain.    Marland Kitchen Propylene Glycol (SYSTANE BALANCE) 0.6 % SOLN Apply 1 drop to eye as needed (dry eyes).    . ticagrelor (BRILINTA) 90 MG TABS tablet Take 1 tablet (90 mg total) by mouth 2 (two) times daily. 180 tablet 2  . tiZANidine (ZANAFLEX) 4 MG tablet TAKE 1&1/2 TABLETS DAILY  3   No current facility-administered medications on file prior to visit.     ALLERGIES: Allergies  Allergen Reactions  . Ibuprofen     Other reaction(s): Upset Stomach  . Ampicillin  Nausea Only  . Asa [Aspirin] Diarrhea    Other reaction(s): Upset Stomach  . Darvon [Propoxyphene] Nausea And Vomiting  . Demeclocycline Other (See Comments)    Nerves feeling  . Eggs Or Egg-Derived Products Diarrhea  . Lactalbumin Diarrhea  . Lisinopril Cough  . Milk-Related Compounds Diarrhea  . Salicylates Other (See Comments)    Unknown Unknown   . Tetracycline Hcl Hives and Nausea Only  . Tetracyclines & Related Other (See Comments)    Nerves feeling     FAMILY HISTORY: Family History  Problem Relation Age of Onset  . Heart disease Mother 25       CABG, pacemaker, valve  . Cancer Father   . Heart disease Sister        Fluid around the heart  . Breast cancer Neg Hx    SOCIAL HISTORY: Social History   Socioeconomic History  . Marital status: Divorced    Spouse name: Not on file  . Number of children: Not on file  . Years of education: Not on file  . Highest education level: Not on file  Occupational  History  . Not on file  Social Needs  . Financial resource strain: Not on file  . Food insecurity:    Worry: Not on file    Inability: Not on file  . Transportation needs:    Medical: Not on file    Non-medical: Not on file  Tobacco Use  . Smoking status: Never Smoker  . Smokeless tobacco: Never Used  Substance and Sexual Activity  . Alcohol use: Yes    Comment: Socially. [name].  . Drug use: No  . Sexual activity: Not Currently  Lifestyle  . Physical activity:    Days per week: Not on file    Minutes per session: Not on file  . Stress: Not on file  Relationships  . Social connections:    Talks on phone: Not on file    Gets together: Not on file    Attends religious service: Not on file    Active member of club or organization: Not on file    Attends meetings of clubs or organizations: Not on file    Relationship status: Not on file  . Intimate partner violence:    Fear of current or ex partner: Not on file    Emotionally abused: Not on file    Physically abused: Not on file    Forced sexual activity: Not on file  Other Topics Concern  . Not on file  Social History Narrative   Current Social History        Who lives at home: Lives with youngest daughter, "Olivia Mackie" 09/25/2016    Transportation: Public 0/88/1103   Important Relationships & Pets: 2 daughters (52, 60), 1 son (39), 10 grandchildren, 4 great grandchildren 09/25/2016    Current Stressors: Recently moved from [place], [place] due to threat of NH placement 09/25/2016   Religious / Personal Beliefs: Christian 09/25/2016   Interests / Fun: Dancing 09/25/2016   Other: Participates in [employer] 09/25/2016    REVIEW OF SYSTEMS: Constitutional: No fevers, chills, or sweats, no generalized fatigue, change in appetite Eyes: No visual changes, double vision, eye pain Ear, nose and throat: No hearing loss, ear pain, nasal congestion, sore throat Cardiovascular: No chest pain, palpitations Respiratory:  No shortness  of breath at rest or with exertion, wheezes GastrointestinaI: No nausea, vomiting, diarrhea, abdominal pain, fecal incontinence Genitourinary:  No dysuria, urinary retention or frequency Musculoskeletal:  No  neck pain, back pain Integumentary: No rash, pruritus, skin lesions Neurological: as above Psychiatric: No depression, insomnia, anxiety Endocrine: No palpitations, fatigue, diaphoresis, mood swings, change in appetite, change in weight, increased thirst Hematologic/Lymphatic:  No purpura, petechiae. Allergic/Immunologic: no itchy/runny eyes, nasal congestion, recent allergic reactions, rashes  PHYSICAL EXAM: Blood pressure (!) 162/94, pulse 70, height 5\' 7"  (1.702 m), weight (!) 311 lb (141.1 kg), SpO2 97 %. General: No acute distress.  Patient appears well-groomed.  Morbidly obese body habitus. Head:  Normocephalic/atraumatic Eyes:  Fundi examined but not visualized Neck: supple, no paraspinal tenderness, full range of motion Heart:  Regular rate and rhythm Lungs:  Clear to auscultation bilaterally Back: No paraspinal tenderness Neurological Exam: alert and oriented to person, place, and time. Attention span and concentration intact, recent and remote memory intact, fund of knowledge intact.  Speech fluent and not dysarthric, language intact.  Decreased temporal vision loss in right eye.  Otherwise, CN II-XII intact. Bulk and tone normal, muscle strength limited due to pain.  Sensation to light touch intact.  Deep tendon reflexes 2+ throughout, toes downgoing.  Finger to nose and heel to shin testing intact.  Unsteady and antalgic gait.  Requires cane to ambulate.  IMPRESSION: 1.  migraine without aura, without status migrainosus, not intractable 2.  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  Morbid obesity (BMI 48.71) 4.  OSA 5.  HTN  PLAN: 1.  For preventative management, she will continue topiramate 50 mg at bedtime 2.  For abortive  therapy, she will try flurbiprofen 50 mg 3.  Limit use of pain relievers to no more than 2 days out of week to prevent risk of rebound or medication-overuse headache. 4.  Keep headache diary 5. We will refer her to ophthalmology for visual field testing regarding known pituitary adenoma. 6. Repeat MRI of brain with and without contrast in September with attention to the left cavernous sinus.  7. Exercise, hydration, caffeine cessation, sleep hygiene with appropriate treatment of her sleep apnea, monitor for and avoid triggers 8.  Consider:  magnesium citrate 400mg  daily, riboflavin 400mg  daily, and coenzyme Q10 100mg  three times daily 9. Follow up with PCP regarding blood pressure and request for a walker 10. Follow up in 6 months   Metta Clines, DO  CC: Joni Reining, DO

## 2018-09-16 ENCOUNTER — Encounter: Payer: Self-pay | Admitting: Neurology

## 2018-09-16 ENCOUNTER — Ambulatory Visit (INDEPENDENT_AMBULATORY_CARE_PROVIDER_SITE_OTHER): Payer: Medicare Other | Admitting: Neurology

## 2018-09-16 VITALS — BP 162/94 | HR 70 | Ht 67.0 in | Wt 311.0 lb

## 2018-09-16 DIAGNOSIS — G43009 Migraine without aura, not intractable, without status migrainosus: Secondary | ICD-10-CM | POA: Diagnosis not present

## 2018-09-16 DIAGNOSIS — D352 Benign neoplasm of pituitary gland: Secondary | ICD-10-CM

## 2018-09-16 DIAGNOSIS — G4733 Obstructive sleep apnea (adult) (pediatric): Secondary | ICD-10-CM

## 2018-09-16 DIAGNOSIS — I10 Essential (primary) hypertension: Secondary | ICD-10-CM

## 2018-09-16 MED ORDER — FLURBIPROFEN 50 MG PO TABS: ORAL_TABLET | ORAL | 3 refills | Status: DC

## 2018-09-16 NOTE — Patient Instructions (Addendum)
1.  Continue topiramate 50mg  at bedtime 2.  At earliest onset of migraine, take flurbiprofen 50mg .  May repeat every 8 hours as needed, maximum of 3 tablets in 24 hours. 3.  Will refer you to another eye doctor 4.  Follow up with PCP regarding blood pressure and seeing about getting a walker 5.  Keep headache diary 6.  Repeat MRI of brain and pituitary gland with and without contrast with attention to left cavernous sinus in September 7. Follow up in 6 months.

## 2018-09-17 ENCOUNTER — Telehealth: Payer: Self-pay | Admitting: Neurology

## 2018-09-17 NOTE — Telephone Encounter (Signed)
Miranda from [facility] left vm about patient medication being on backorder. Please call back at [phone]. Thanks!

## 2018-09-18 ENCOUNTER — Telehealth: Payer: Self-pay | Admitting: *Deleted

## 2018-09-18 NOTE — Telephone Encounter (Signed)
Diclofenac 50mg  up to three times daily as needed

## 2018-09-18 NOTE — Telephone Encounter (Signed)
Called [facility] spoke with Estill Bamberg, gave verbal for #90 with 1 refill

## 2018-09-18 NOTE — Telephone Encounter (Signed)
OK thank you helen, I appreciate you having her seen in [facility]

## 2018-09-18 NOTE — Telephone Encounter (Signed)
Pt calls and states she has had at least 7 falls from her wh/ch in appr 1 1/2 weeks. She states she does not know what is causing the falls.attempted to ask questions r/t to falls. H/a, vision changes, strength changes, speech changes, ch pain, short of breath. Pt does not care to answer, just wants an appt, tried to explain that it may help determine if she needs emergent care or can truly wait. She states she was just at her neurologist office and he told her to see her pcp asap otherwise she is alright. appt given for 1/23 at 1345.

## 2018-09-18 NOTE — Telephone Encounter (Signed)
Called [facility], spoke with [place]. Flurbiprofen is on backorder, no release date has been provided.  Any other suggestions?

## 2018-09-19 ENCOUNTER — Ambulatory Visit (INDEPENDENT_AMBULATORY_CARE_PROVIDER_SITE_OTHER): Payer: Medicare Other | Admitting: Internal Medicine

## 2018-09-19 ENCOUNTER — Other Ambulatory Visit: Payer: Self-pay

## 2018-09-19 ENCOUNTER — Ambulatory Visit (HOSPITAL_COMMUNITY)
Admission: RE | Admit: 2018-09-19 | Discharge: 2018-09-19 | Disposition: A | Discharge status: Home / Self Care | Payer: Medicare Other | Source: Ambulatory Visit | Attending: Internal Medicine | Admitting: Internal Medicine

## 2018-09-19 VITALS — BP 172/82 | HR 75 | Temp 97.4°F | Ht 66.0 in | Wt 312.1 lb

## 2018-09-19 DIAGNOSIS — R296 Repeated falls: Secondary | ICD-10-CM | POA: Diagnosis not present

## 2018-09-19 DIAGNOSIS — M129 Arthropathy, unspecified: Secondary | ICD-10-CM

## 2018-09-19 DIAGNOSIS — F319 Bipolar disorder, unspecified: Secondary | ICD-10-CM

## 2018-09-19 DIAGNOSIS — I10 Essential (primary) hypertension: Secondary | ICD-10-CM | POA: Diagnosis not present

## 2018-09-19 DIAGNOSIS — M519 Unspecified thoracic, thoracolumbar and lumbosacral intervertebral disc disorder: Secondary | ICD-10-CM | POA: Diagnosis not present

## 2018-09-19 DIAGNOSIS — Z9181 History of falling: Secondary | ICD-10-CM

## 2018-09-19 DIAGNOSIS — Z79899 Other long term (current) drug therapy: Secondary | ICD-10-CM

## 2018-09-19 DIAGNOSIS — Z6841 Body Mass Index (BMI) 40.0 and over, adult: Secondary | ICD-10-CM

## 2018-09-19 MED ORDER — CYCLOBENZAPRINE HCL 5 MG PO TABS: 5.0000 mg | ORAL_TABLET | Freq: Every day | ORAL | 0 refills | Status: DC

## 2018-09-19 NOTE — Patient Instructions (Signed)
Alisha [name],  Thank you for allowing [place] to provide your care today. Today we discussed your recent falls.    I have ordered X-rays of your hip and arm for you. I will call if any are abnormal.    Please call us if you continue to have significant falls.    Should you have any questions or concerns please call the internal medicine clinic at [phone].     Joint Pain Joint pain (arthralgia) may be caused by many things. Joint pain is likely to go away when you follow instructions from your health care provider for relieving pain at home. However, joint pain can also be caused by conditions that require more treatment. Common causes of joint pain include:  Bruising in the area of the joint.  Injury caused by repeating certain movements too many times (overuse injury).  Age-related joint wear and tear (osteoarthritis).  Buildup of uric acid crystals in the joint (gout).  Inflammation of the joint (rheumatic disease).  Various other forms of arthritis.  Infections of the joint (septic arthritis) or of the bone (osteomyelitis). Your health care provider may recommend that you take pain medicine or wear a supportive device like an elastic bandage, sling, or splint. If your joint pain continues, you may need lab or imaging tests to diagnose the cause of your joint pain. Follow these instructions at home: Managing pain, stiffness, and swelling   If directed, put ice on the painful area. Icing can help to relieve joint pain and swelling. ? Put ice in a plastic bag. ? Place a towel between your skin and the bag. ? Leave the ice on for 20 minutes, 2-3 times a day.  If directed, apply heat to the painful area as often as told by your health care provider. Heat can reduce the stiffness of your muscles and joints. Use the heat source that your health care provider recommends, such as a moist heat pack or a heating pad. ? Place a towel between your skin and the heat source. ? Leave the heat  on for 20-30 minutes. ? Remove the heat if your skin turns bright red. This is especially important if you are unable to feel pain, heat, or cold. You may have a greater risk of getting burned.  Move your fingers or toes below the painful joint often. You can avoid stiffness and lessen swelling by doing this.  If possible, raise (elevate) the painful joint above the level of your heart while you are sitting or lying down. To do this, try putting a few pillows under the painful joint. Activity  Rest the painful joint for as long as directed. Do not do anything that causes or worsens pain.  Begin exercising or stretching the affected area, as told by your health care provider. Ask your health care provider what types of exercise are safe for you. If you have an elastic bandage, sling, or splint:  Wear the supportive device as told by your health care provider. Remove it only as told by your health care provider.  Loosen the device if your fingers or toes below the joint tingle, become numb, or turn cold and blue.  Keep the device clean.  Ask your health care provider if you should remove the device before bathing. You may need to cover it with a watertight covering when you take a bath or a shower. General instructions  Take over-the-counter and prescription medicines only as told by your health care provider.  Do not use any  products that contain nicotine or tobacco, such as cigarettes and e-cigarettes. If you need help quitting, ask your health care provider.  Keep all follow-up visits as told by your health care provider. This is important. Contact a health care provider if:  You have pain that gets worse and does not get better with medicine.  Your joint pain does not improve within 3 days.  You have increased bruising or swelling.  You have a fever.  You lose 10 lb (4.5 kg) or more without trying. Get help right away if:  You cannot move the joint.  Your fingers or toes  tingle, become numb, or turn cold and blue.  You have a fever along with a joint that is red, warm, and swollen. Summary  Joint pain (arthralgia) may be caused by many things.  Your health care provider may recommend that you take pain medicine or wear a supportive device like an elastic bandage, sling, or splint.  If your joint pain continues, you may need tests to diagnose the cause of your joint pain.  Take over-the-counter and prescription medicines only as told by your health care provider. This information is not intended to replace advice given to you by your health care provider. Make sure you discuss any questions you have with your health care provider. Document Released: 08/14/2005 Document Revised: 05/30/2017 Document Reviewed: 05/30/2017 Elsevier Interactive Patient Education  2019 [employer].

## 2018-09-20 ENCOUNTER — Telehealth: Payer: Self-pay

## 2018-09-20 DIAGNOSIS — J45909 Unspecified asthma, uncomplicated: Secondary | ICD-10-CM | POA: Diagnosis not present

## 2018-09-20 DIAGNOSIS — J449 Chronic obstructive pulmonary disease, unspecified: Secondary | ICD-10-CM | POA: Diagnosis not present

## 2018-09-20 DIAGNOSIS — J42 Unspecified chronic bronchitis: Secondary | ICD-10-CM | POA: Diagnosis not present

## 2018-09-20 DIAGNOSIS — R296 Repeated falls: Secondary | ICD-10-CM

## 2018-09-20 LAB — BMP8+ANION GAP
ANION GAP: 19 mmol/L — AB (ref 10.0–18.0)
BUN/Creatinine Ratio: 18 (ref 12–28)
BUN: 17 mg/dL (ref 8–27)
CALCIUM: 9.4 mg/dL (ref 8.7–10.3)
CO2: 18 mmol/L — ABNORMAL LOW (ref 20–29)
CREATININE: 0.96 mg/dL (ref 0.57–1.00)
Chloride: 105 mmol/L (ref 96–106)
GFR, EST AFRICAN AMERICAN: 70 mL/min/{1.73_m2} (ref >59)
GFR, EST NON AFRICAN AMERICAN: 61 mL/min/{1.73_m2} (ref >59)
Glucose: 171 mg/dL — ABNORMAL HIGH (ref 65–99)
POTASSIUM: 3.7 mmol/L (ref 3.5–5.2)
Sodium: 142 mmol/L (ref 134–144)

## 2018-09-20 NOTE — Telephone Encounter (Signed)
Needs to speak with you about walker. Please call pt back.

## 2018-09-22 ENCOUNTER — Encounter: Payer: Self-pay | Admitting: Internal Medicine

## 2018-09-22 DIAGNOSIS — R296 Repeated falls: Secondary | ICD-10-CM | POA: Insufficient documentation

## 2018-09-22 NOTE — Progress Notes (Signed)
CC: Fall  HPI: [name] is a 68 y.o. F w/ PMH of right rotator cuff arthropathy, herniated disc, bipolar disorder and morbid obesity presenting to [facility] after recurrent falls.  She states she was in her usual state of health until about a month ago when she started having recurrent falls.  She states on her way out from her house to the bus stop, she takes a sharp turn where she is unable to keep her balance and falls off her mobility scooter. She recounts total of 7 falls within the last 2 months and except for one episode when she slid off her chair in her room, all of her falls occurred in front of her house. This particular location has a curve that is uneven with a slight divot in the corner which makes the mobility scooter lean on one side. She mentions that she has landed couple times on grass and couple times on concrete.  She mentions that in the past she was able to keep her balance while turning however recently she has noted occasional difficulty holding on. She is currently endorsing significant pain on her right elbow, right shoulder, right and left hip. She also mentions significant worsening of her chronic lower back pain.  She denies any loss of consciousness, trauma to the head, seizure like activity, incontinence, chest pain, palpitations, dyspnea associated with these episodes.  Denies any fevers chills nausea vomiting.  Past Medical History:  Diagnosis Date  . Arthritis   . Asthma   . Bipolar disorder ([place])   . CAD (coronary artery disease) 07/07/2016   a. s/p stenting x 2 in [place] to the LAD, RCA is chronically occluded, LVEDP was 34  . Cancer (HCC)    cervical  . Depression   . Fibromyalgia   . GERD (gastroesophageal reflux disease)   . History of benign pituitary tumor   . History of kidney stones    1972  . Hyperlipidemia   . Hypertension   . Migraine   . Morbid obesity ([place])   . Peripheral vascular disease ([place])   . Pneumonia   . Seizures ([place])    during surgery for pituitary tumor  . Sleep apnea    with cpap  . Vertigo    Review of Systems: Review of Systems  Constitutional: Negative for chills, fever and malaise/fatigue.  HENT: Negative for ear pain and sinus pain.   Eyes: Negative for blurred vision.  Respiratory: Negative for cough and shortness of breath.   Cardiovascular: Negative for chest pain and palpitations.  Gastrointestinal: Negative for nausea and vomiting.  Musculoskeletal: Positive for back pain, falls, joint pain and myalgias. Negative for neck pain.  Neurological: Negative for dizziness, sensory change, focal weakness, seizures, loss of consciousness, weakness and headaches.     Physical Exam: Vitals:   09/19/18 1342 09/19/18 1427  BP: (!) 191/76 (!) 172/82  Pulse: 75   Temp: (!) 97.4 F (36.3 C)   TempSrc: Oral   SpO2: 98%   Weight: (!) 312 lb 1.6 oz (141.6 kg)   Height: 5\' 6"  (1.676 m)     Physical Exam  Constitutional: She is oriented to person, place, and time. She appears well-developed and well-nourished. She appears distressed (Appear very uncomfortable and in pain especially with movement).  HENT:  Mouth/Throat: Oropharynx is clear and moist. No oropharyngeal exudate.  Eyes: Conjunctivae are normal. No scleral icterus.  Neck: Normal range of motion. Neck supple. No JVD present.  Respiratory: Effort normal and breath sounds  normal. She has no wheezes.  GI: Soft. Bowel sounds are normal. There is no abdominal tenderness.  Musculoskeletal: Normal range of motion.        General: Tenderness (Tenderness on movement of right shoulder and right elbow. Point tenderness to palpation of right elbow. Tenderness to palpation of R side of lumbar spine. Severe antalgic gait with preference for left side.) present. No deformity.  Neurological: She is alert and oriented to person, place, and time.  Neurologic exam: Mental status: A&Ox3 Cranial Nerves: II: PERRL III, IV, VI:  Extra-occular motions intact bilaterally V, VII: Face symmetric, sensation intact in all 3 divisions  VIII: hearing normal to rubbing fingers bilaterally  IX, X: palate rises symmetrically XI: Head turn and shoulder shrug normal bilaterally  XII: tongue midline  Motor: Strength 5/5 on all upper and lower extremities, bulk muscle and tone are normal Sensory: Light touch intact and symmetric bilaterally  Coordination: There is no dysmetria on finger-to-nose. Rapid alternating movement test normal. Psychiatric: Normal mood and affect    Assessment & Plan:   Essential hypertension BP Readings from Last 3 Encounters:  09/19/18 (!) 172/82  09/16/18 (!) 162/94  07/23/18 (!) 146/70   Blood pressure above goal. Currently in distress due to pain from falls. Will need to re-assess once acute pain resolves. Had mild hypokalemia on prior BMP with chronic furosemide use. Will recheck today.  - BMP today - C/w current regimen: losartan 100mg  daily, amlodipine 10mg  daily, nevibolol 2.5mg  daily, furosemide 40mg  daily - Recheck BP once acute pain resolves  Recurrent falls Alisha [name] presents with recurrent falls of 1~2 month duration. Falls all occur at one curving sidewalk in front of her house with uneven terrain. Describes the falls as 'sliding out of' her mobility scooter during a tight turn. Currently endorsing significant joint tenderness focused around the right side. Concerning for fracture considering age and BMI. Most likely mechanical in nature considering recurrence at same location. Denies any palpitations, syncope, seizure-like activity, or focal neurological deficits. Would benefit mostly from weight loss and physical therapy. Chart review shows she follows closely with sports medicine and is enrolled in ongoing physical therapy program.  - X-ray of R elbow, spine, hips - Short course of flexeril for muscle spasms - Recommend  OTC NSAIDs if no renal dysfunction on BMP - F/u with sports medicine - DME home use walker ordered for gait difficulty    Patient discussed with Dr. Dareen Piano  -Gilberto Better, PGY1

## 2018-09-22 NOTE — Assessment & Plan Note (Signed)
BP Readings from Last 3 Encounters:  09/19/18 (!) 172/82  09/16/18 (!) 162/94  07/23/18 (!) 146/70   Blood pressure above goal. Currently in distress due to pain from falls. Will need to re-assess once acute pain resolves. Had mild hypokalemia on prior BMP with chronic furosemide use. Will recheck today.  - BMP today - C/w current regimen: losartan 100mg  daily, amlodipine 10mg  daily, nevibolol 2.5mg  daily, furosemide 40mg  daily - Recheck BP once acute pain resolves

## 2018-09-22 NOTE — Assessment & Plan Note (Signed)
Alisha [name] presents with recurrent falls of 1~2 month duration. Falls all occur at one curving sidewalk in front of her house with uneven terrain. Describes the falls as 'sliding out of' her mobility scooter during a tight turn. Currently endorsing significant joint tenderness focused around the right side. Concerning for fracture considering age and BMI. Most likely mechanical in nature considering recurrence at same location. Denies any palpitations, syncope, seizure-like activity, or focal neurological deficits. Would benefit mostly from weight loss and physical therapy. Chart review shows she follows closely with sports medicine and is enrolled in ongoing physical therapy program.  - X-ray of R elbow, spine, hips - Short course of flexeril for muscle spasms - Recommend OTC NSAIDs if no renal dysfunction on BMP - F/u with sports medicine - DME home use walker ordered for gait difficulty

## 2018-09-23 ENCOUNTER — Ambulatory Visit: Payer: Medicare Other | Admitting: Podiatry

## 2018-09-23 NOTE — Telephone Encounter (Signed)
Pt calling back regarding a walker. Please call pt back.

## 2018-09-23 NOTE — Progress Notes (Signed)
Internal Medicine Clinic Attending ° °Case discussed with Dr. Lee at the time of the visit.  We reviewed the resident’s history and exam and pertinent patient test results.  I agree with the assessment, diagnosis, and plan of care documented in the resident’s note.  °

## 2018-09-24 NOTE — Telephone Encounter (Signed)
Spoke with patient regarding rolator. She has no preference in Supply provider. Spoke with Barnetta Chapel at [facility] Order will need to say bariatric as patient weighs 312lb. Also medicare will not cover cost with dx of morbid obesity. Will need to be changed to frequent falls. Will send request to Attending who cosigned order and PCP as provider who placed order is unavailable at this time. Hubbard Hartshorn, RN, BSN

## 2018-09-24 NOTE — Telephone Encounter (Signed)
I ordered it

## 2018-09-24 NOTE — Telephone Encounter (Signed)
No problem.

## 2018-09-24 NOTE — Telephone Encounter (Signed)
Barnetta Chapel at [facility] notified that order has been updated. Order, demographic sheet, OV notes and insurance card faxed to [place] at [phone]. Hubbard Hartshorn, RN, BSN

## 2018-09-27 DIAGNOSIS — R296 Repeated falls: Secondary | ICD-10-CM | POA: Diagnosis not present

## 2018-09-30 ENCOUNTER — Telehealth: Payer: Self-pay | Admitting: Internal Medicine

## 2018-09-30 NOTE — Telephone Encounter (Signed)
Spoke with Alisha [name] on her home phone. Discussed her balance and fall risk since getting her rolator. She states she has had no further falls and she feels much better in terms of keeping her balance. She also had significant improvement with her pain over time. Reassured her that her X-rays showed no significant fractures but does show signs of chronic degenerative changes. She expressed understanding. All other concerns addressed.

## 2018-10-10 NOTE — Progress Notes (Signed)
Cardiology Office Note   Date:  10/11/2018   ID:  Alisha [name], DOB [birth year], MRN [id number]  PCP:  Alisha Groves, DO  Cardiologist:   Alisha Breeding, MD    Chief Complaint  Patient presents with  . Coronary Artery Disease      History of Present Illness: Alisha [name] is a 68 y.o. female who presents for evaluation of coronary disease. She is moved here from [place]. She reports that in late October 2017 she presented with chest pain and received 2 stents to the LAD with an occluded RCA.  She has been seen by Dr. Radford [name] for treatment of sleep apnea.  She has been treated for asthma.  Since I last saw her she is done well from a cardiovascular standpoint.  She is fallen about 7 times.  Her mobile motorized scooter tipped over because the sidewalk is uneven.  She has not had syncope.  She is had no chest discomfort, neck or arm discomfort.  She is not noticing any palpitations, presyncope or syncope.  She has not required any nitroglycerin.  She has some increased weight and leg swelling because she was not taking her diuretic because it would make her urinate.  She says this is now improved because she started taking her diuretic again.   Past Medical History:  Diagnosis Date  . Arthritis   . Asthma   . Bipolar disorder ([place])   . CAD (coronary artery disease) 07/07/2016   a. s/p stenting x 2 in [place] to the LAD, RCA is chronically occluded, LVEDP was 34  . Cancer (HCC)    cervical  . Depression   . Fibromyalgia   . GERD (gastroesophageal reflux disease)   . History of benign pituitary tumor   . History of kidney stones    1972  . Hyperlipidemia   . Hypertension   . Migraine   . Morbid obesity ([place])   . Peripheral vascular disease ([place])   . Pneumonia   . Seizures ([place])    during surgery for pituitary tumor  . Sleep apnea    with cpap  . Vertigo     Past Surgical History:  Procedure Laterality Date  . ANKLE FUSION Bilateral   . carpel tunnel release  Bilateral 2017  . CORONARY ANGIOPLASTY    . ESOPHAGOGASTRODUODENOSCOPY (EGD) WITH PROPOFOL N/A 06/14/2017   Procedure: ESOPHAGOGASTRODUODENOSCOPY (EGD) WITH PROPOFOL;  Surgeon: Wilford Corner, MD;  Location: [facility];  Service: Endoscopy;  Laterality: N/A;  . EXCISION / CURETTAGE BONE CYST PHALANGES OF FOOT  2014   Removal of foot cyst   . PARTIAL HYSTERECTOMY  unknown   Patient still has ovaries  . pituitary tumor removal  1999   removed as much as they could, on optic nerve  . TONSILLECTOMY    . TOTAL KNEE ARTHROPLASTY     TKR X 3  2 on the left and 1 on the right     Current Outpatient Medications  Medication Sig Dispense Refill  . albuterol (PROVENTIL) (2.5 MG/3ML) 0.083% nebulizer solution Take 3 mLs (2.5 mg total) by nebulization every 6 (six) hours as needed for wheezing or shortness of breath. 150 mL 1  . albuterol (VENTOLIN HFA) 108 (90 Base) MCG/ACT inhaler Inhale 1 puff into the lungs every 6 (six) hours as needed for wheezing or shortness of breath.    Marland Kitchen aspirin 81 MG EC tablet TAKE 1 TABLET BY MOUTH EVERY DAY 90 tablet 2  . budesonide-formoterol (SYMBICORT) 80-4.5 MCG/ACT inhaler  Inhale 2 puffs into the lungs 2 (two) times daily. 1 Inhaler 11  . chlorpheniramine (CHLOR-TRIMETON) 4 MG tablet Take 4 mg by mouth 2 (two) times daily as needed for allergies.    . cyclobenzaprine (FLEXERIL) 5 MG tablet Take 1-2 tablets (5-10 mg total) by mouth at bedtime. 10 tablet 0  . dextromethorphan (DELSYM) 30 MG/5ML liquid Take 15 mg by mouth 2 (two) times daily as needed for cough.    . DULoxetine (CYMBALTA) 60 MG capsule Take 1 capsule (60 mg total) by mouth daily. 90 capsule 3  . esomeprazole (NEXIUM) 40 MG capsule Take 40 mg by mouth 2 (two) times daily before a meal.    . fexofenadine (ALLEGRA) 180 MG tablet Take 180 mg by mouth daily.    . flurbiprofen (ANSAID) 50 MG tablet Take 1 tablet every 8 hours as needed, maximum 3 tablets/24 hours 30 tablet 3  . furosemide (LASIX) 20 MG  tablet Take 2 tablets (40 mg total) by mouth daily. 180 tablet 3  . gabapentin (NEURONTIN) 100 MG capsule TAKE 1 CAPSULE (100 MG TOTAL) BY MOUTH 3 (THREE) TIMES DAILY. 270 capsule 1  . isosorbide dinitrate (ISORDIL) 30 MG tablet Take 30 mg by mouth daily.    Marland Kitchen loratadine (CLARITIN) 10 MG tablet Take 10 mg by mouth daily.  3  . losartan (COZAAR) 100 MG tablet Take 1 tablet (100 mg total) daily by mouth. 90 tablet 0  . mirabegron ER (MYRBETRIQ) 50 MG TB24 tablet Take 50 mg by mouth daily.    Marland Kitchen morphine (MS CONTIN) 15 MG 12 hr tablet Take 1 tablet (15 mg total) by mouth daily as needed for pain. 15 tablet 0  . oxybutynin (DITROPAN-XL) 10 MG 24 hr tablet Take 10 mg by mouth at bedtime.    Marland Kitchen oxyCODONE-acetaminophen (PERCOCET/ROXICET) 5-325 MG tablet Take 1 tablet by mouth daily as needed for severe pain.    Marland Kitchen Propylene Glycol (SYSTANE BALANCE) 0.6 % SOLN Apply 1 drop to eye as needed (dry eyes).    Marland Kitchen tiZANidine (ZANAFLEX) 4 MG tablet TAKE 1&1/2 TABLETS DAILY  3  . rosuvastatin (CRESTOR) 40 MG tablet Take 1 tablet (40 mg total) by mouth daily. 90 tablet 3   No current facility-administered medications for this visit.     Allergies:   Ibuprofen; Ampicillin; Asa [aspirin]; Darvon [propoxyphene]; Demeclocycline; Eggs or egg-derived products; Lactalbumin; Lisinopril; Milk-related compounds; Salicylates; Tetracycline hcl; and Tetracyclines & related     ROS:  Please see the history of present illness.   Otherwise, review of systems are positive for back pain, joint pain.   All other systems are reviewed and negative.    PHYSICAL EXAM: VS:  BP (!) 144/80   Pulse 61   Ht 5\' 6"  (1.676 m)   Wt (!) 302 lb 3.2 oz (137.1 kg)   BMI 48.78 kg/m  , BMI Body mass index is 48.78 kg/m.  GEN:  No distress NECK:  No jugular venous distention at 90 degrees, waveform within normal limits, carotid upstroke brisk and symmetric, no bruits, no thyromegaly LUNGS:  Clear to auscultation bilaterally HEART:  S1 and S2  within normal limits, no S3, no S4, no clicks, no rubs, no murmurs ABD:  Positive bowel sounds normal in frequency in pitch, no bruits, no rebound, no guarding, unable to assess midline mass or bruit with the patient seated. EXT:  2 plus pulses throughout, mild/mod edema, no cyanosis no clubbing SKIN:  No rashes no nodules NEURO:  Cranial nerves II through  XII grossly intact, motor grossly intact throughout PSYCH:  Cognitively intact, oriented to person place and time   EKG:  EKG is  ordered today. Sinus rhythm, rate 61, leftward axis, no acute ST-T wave changes.  Recent Labs: 12/13/2017: ALT 12 12/14/2017: NT-Pro BNP 26 04/11/2018: B Natriuretic Peptide 82.6; Hemoglobin 13.0; Platelets 188; TSH 1.860 09/19/2018: BUN 17; Creatinine, Ser 0.96; Potassium 3.7; Sodium 142    Lipid Panel    Component Value Date/Time   CHOL 206 (H) 12/13/2017 1142   TRIG 116 12/13/2017 1142   HDL 47 12/13/2017 1142   CHOLHDL 4.4 12/13/2017 1142   CHOLHDL 4.6 11/15/2016 1013   VLDL 30 11/15/2016 1013   LDLCALC 136 (H) 12/13/2017 1142      Wt Readings from Last 3 Encounters:  10/11/18 (!) 302 lb 3.2 oz (137.1 kg)  09/19/18 (!) 312 lb 1.6 oz (141.6 kg)  09/16/18 (!) 311 lb (141.1 kg)      Other studies Reviewed: Additional studies/ records that were reviewed today include: Labs Review of the above records demonstrates:   See above and below   ASSESSMENT AND PLAN:  CAD:    The patient has no new sypmtoms.  No further cardiovascular testing is indicated.  Of note given the fact that she is having significant falls I think it is probably risk continue dual antiplatelet therapy.  I had kept this going because she had 2 LAD stents and disease elsewhere.  But she is asymptomatic and now high risk for trauma.  She will stop the Brilinta.  LEG PAIN:  She had normal ABIs.  This is now improved and most likely related to her back pain.  HTN:  The blood pressure is at target.  No change in therapy.    HYPERLIPIDEMIA:   LDL was not at target last year.  On the change her to Crestor 40 mg daily stopping the Lipitor and get a lipid profile liver enzymes in 10 weeks.   SOB:   She is being managed for asthma.    Current medicines are reviewed at length with the patient today.  The patient does not have concerns regarding medicines.  The following changes have been made:  As above  Labs/ tests ordered today include:    Orders Placed This Encounter  Procedures  . Lipid panel  . Hepatic function panel  . EKG 12-Lead     Disposition:   FU with me in one year.     Signed, Alisha Breeding, MD  10/11/2018 9:05 AM    [place]

## 2018-10-11 ENCOUNTER — Encounter: Payer: Self-pay | Admitting: Cardiology

## 2018-10-11 ENCOUNTER — Ambulatory Visit (INDEPENDENT_AMBULATORY_CARE_PROVIDER_SITE_OTHER): Payer: Medicare Other | Admitting: Cardiology

## 2018-10-11 VITALS — BP 144/80 | HR 61 | Ht 66.0 in | Wt 302.2 lb

## 2018-10-11 DIAGNOSIS — E785 Hyperlipidemia, unspecified: Secondary | ICD-10-CM | POA: Diagnosis not present

## 2018-10-11 DIAGNOSIS — I2510 Atherosclerotic heart disease of native coronary artery without angina pectoris: Secondary | ICD-10-CM

## 2018-10-11 DIAGNOSIS — I10 Essential (primary) hypertension: Secondary | ICD-10-CM

## 2018-10-11 MED ORDER — ROSUVASTATIN CALCIUM 40 MG PO TABS: 40.0000 mg | ORAL_TABLET | Freq: Every day | ORAL | 3 refills | Status: DC

## 2018-10-11 NOTE — Patient Instructions (Signed)
Medication Instructions:  STOP- Brilinta  STOP- Atorvastatin(Lipitor) START- Crestor 40 mg daily  If you need a refill on your cardiac medications before your next appointment, please call your pharmacy.  Labwork: Fasting Lipid and Liver in 10 weeks HERE IN OUR OFFICE AT [facility]  You will need to fast. DO NOT EAT OR DRINK PAST MIDNIGHT.     Take the provided lab slips with you to the lab for your blood draw.   When you have your labs (blood work) drawn today and your tests are completely normal, you will receive your results only by MyChart Message (if you have MyChart) -OR-  A paper copy in the mail.  If you have any lab test that is abnormal or we need to change your treatment, we will call you to review these results.  Testing/Procedures: None Ordered  Follow-Up: You will need a follow up appointment in 1 Year.  Please call our office 2 months in advance to schedule this appointment.  You may see Dr Sharlynn Oliphant or one of the following Advanced Practice Providers on your designated Care Team:   Rosaria Ferries, PA-C . Jory Sims, DNP, ANP  At [facility], you and your health needs are our priority.  As part of our continuing mission to provide you with exceptional heart care, we have created designated Provider Care Teams.  These Care Teams include your primary Cardiologist (physician) and Advanced Practice Providers (APPs -  Physician Assistants and Nurse Practitioners) who all work together to provide you with the care you need, when you need it.   Thank you for choosing [facility] at [facility]!!

## 2018-10-14 ENCOUNTER — Ambulatory Visit (INDEPENDENT_AMBULATORY_CARE_PROVIDER_SITE_OTHER): Payer: Medicare Other | Admitting: Podiatry

## 2018-10-14 ENCOUNTER — Encounter: Payer: Self-pay | Admitting: Podiatry

## 2018-10-14 DIAGNOSIS — M79674 Pain in right toe(s): Secondary | ICD-10-CM

## 2018-10-14 DIAGNOSIS — M79675 Pain in left toe(s): Secondary | ICD-10-CM

## 2018-10-14 DIAGNOSIS — M7752 Other enthesopathy of left foot: Secondary | ICD-10-CM

## 2018-10-14 DIAGNOSIS — Q828 Other specified congenital malformations of skin: Secondary | ICD-10-CM | POA: Diagnosis not present

## 2018-10-14 DIAGNOSIS — B351 Tinea unguium: Secondary | ICD-10-CM | POA: Diagnosis not present

## 2018-10-14 MED ORDER — TRIAMCINOLONE ACETONIDE 10 MG/ML IJ SUSP
10.0000 mg | Freq: Once | INTRAMUSCULAR | Status: AC
  Administered 2018-10-14: 10 mg

## 2018-10-14 NOTE — Patient Instructions (Signed)

## 2018-10-15 ENCOUNTER — Ambulatory Visit
Admission: RE | Admit: 2018-10-15 | Discharge: 2018-10-15 | Disposition: A | Discharge status: Home / Self Care | Payer: Medicare Other | Source: Ambulatory Visit | Attending: Neurology | Admitting: Neurology

## 2018-10-15 DIAGNOSIS — G43009 Migraine without aura, not intractable, without status migrainosus: Secondary | ICD-10-CM

## 2018-10-15 DIAGNOSIS — R51 Headache: Secondary | ICD-10-CM

## 2018-10-15 DIAGNOSIS — D352 Benign neoplasm of pituitary gland: Secondary | ICD-10-CM

## 2018-10-15 DIAGNOSIS — R519 Headache, unspecified: Secondary | ICD-10-CM

## 2018-10-15 DIAGNOSIS — H53459 Other localized visual field defect, unspecified eye: Secondary | ICD-10-CM

## 2018-10-15 MED ORDER — GADOBENATE DIMEGLUMINE 529 MG/ML IV SOLN
10.0000 mL | Freq: Once | INTRAVENOUS | Status: AC | PRN
  Administered 2018-10-15: 10 mL via INTRAVENOUS

## 2018-10-15 NOTE — Progress Notes (Signed)
Subjective: 68 y.o. returns the office today for painful, elongated, thickened toenails which she cannot trim herself. Denies any redness or drainage around the nails.  She also has secondary concerns of pain to her left ankle.  She is been trying to walk more with a Rollator and since she is been walking more she is been getting some pain to the left ankle.  She denies any recent injury to the ankle.  She was using her electric scooter however she was falling off of this because she was at an incline in the house since that they are trying to get her to walk more.  She denies any increase in swelling to the foot or any other issues.  Denies any acute changes since last appointment and no new complaints today. Denies any systemic complaints such as fevers, chills, nausea, vomiting.   PCP: Lucious Groves, DO   Objective: AAO 3, NAD DP/PT pulses palpable, CRT less than 3 seconds Nails hypertrophic, dystrophic, elongated, brittle, discolored 10. There is tenderness overlying the nails 1-5 bilaterally. There is no surrounding erythema or drainage along the nail sites. Hyperkeratotic lesions the left foot submetatarsal 5 as well as the fifth metatarsal base.  Upon debridement no underlying ulceration drainage or any signs of infection There is tenderness on the anterior lateral aspect of the left ankle joint.  There is tenderness along the anterior ankle joint line.  There is no area pinpoint bony tenderness or pain to vibratory sensation.  Chronic lower extremity edema bilaterally.  Achilles tendon, plantar fascial, lateral ankle ligaments appear to be intact.  Prior subtalar joint fusion. No open lesions or pre-ulcerative lesions are identified. No other areas of tenderness bilateral lower extremities. No overlying edema, erythema, increased warmth. No pain with calf compression, swelling, warmth, erythema.  Assessment: Patient presents with symptomatic onychomycosis; hyperkeratotic lesions ; left  ankle capsulitis  Plan: -Treatment options including alternatives, risks, complications were discussed -Nails sharply debrided 10 without complication/bleeding. -Debrided hyperkeratotic lesions x2 without any complications or bleeding. -She want to hold off on x-rays.  Today steroid injection was performed to the left ankle.  She has an ankle brace at home but due to the swelling she gets it difficult for her to wear this without causing any irritation. -Discussed daily foot inspection. If there are any changes, to call the office immediately.  -Follow-up in 2 to 3 weeks for left ankle pain as well as 3 months for routine care if needed or sooner if any problems are to arise. In the meantime, encouraged to call the office with any questions, concerns, changes symptoms.  Procedure: Injection intermediate joint Discussed alternatives, risks, complications and verbal consent was obtained.  Location: Left ankle joint Skin Prep: Betadine. Injectate: 0.5cc 0.5% marcaine plain, 0.5 cc 2% lidocaine plain and, 1 cc kenalog 10. Disposition: Patient tolerated procedure well. Injection site dressed with a band-aid.  Post-injection care was discussed and return precautions discussed.    Celesta Gentile, DPM

## 2018-10-17 ENCOUNTER — Encounter (INDEPENDENT_AMBULATORY_CARE_PROVIDER_SITE_OTHER): Payer: Self-pay

## 2018-10-17 ENCOUNTER — Ambulatory Visit: Payer: Medicare Other | Admitting: Cardiology

## 2018-10-18 ENCOUNTER — Telehealth: Payer: Self-pay

## 2018-10-18 MED ORDER — CLOPIDOGREL BISULFATE 75 MG PO TABS: 75.0000 mg | ORAL_TABLET | Freq: Every day | ORAL | 11 refills | Status: AC

## 2018-10-18 NOTE — Telephone Encounter (Signed)
-----   Message from Pieter Partridge, DO sent at 10/15/2018  2:31 PM EST ----- Pituitary looks stable.  Incidentally, there is a small stroke.  I would recommend from ASA 81mg  daily to Plavix 75mg  daily which is a stronger blood thinner.

## 2018-10-18 NOTE — Telephone Encounter (Signed)
Called and spoke with Pt, advised of MRI results and Plavix

## 2018-10-25 ENCOUNTER — Ambulatory Visit: Payer: Medicare Other | Admitting: Internal Medicine

## 2018-10-28 ENCOUNTER — Encounter: Payer: Self-pay | Admitting: Internal Medicine

## 2018-10-28 ENCOUNTER — Ambulatory Visit (INDEPENDENT_AMBULATORY_CARE_PROVIDER_SITE_OTHER): Payer: Medicare Other | Admitting: Internal Medicine

## 2018-10-28 VITALS — BP 146/80 | HR 61 | Ht 66.0 in | Wt 298.0 lb

## 2018-10-28 DIAGNOSIS — R058 Other specified cough: Secondary | ICD-10-CM

## 2018-10-28 DIAGNOSIS — J45991 Cough variant asthma: Secondary | ICD-10-CM | POA: Diagnosis not present

## 2018-10-28 DIAGNOSIS — R05 Cough: Secondary | ICD-10-CM | POA: Diagnosis not present

## 2018-10-28 NOTE — Assessment & Plan Note (Addendum)
Onset of symptoms 1980s 10/25/2017  After extensive coaching inhaler device  effectiveness =    75% Changed advair dpi to symb 80  2bid  Spirometry 10/25/2017  FEV1 1.30 (63%)  Ratio 85 p am advair 250   - FENO 10/25/2017  =   15 - Allergy profile 10/25/2017 >  Eos 0.1 /  IgE  139 RAST Pos dog/dust / ragweed  -  11/30/2017    try symb 80 2bid -  PFT's  01/04/2018  FEV1 1.68 (82 % ) ratio 89  p 10 % improvement from saba p sym 80 prior to study with DLCO  78/78 % corrects to 118 % for alv volume   -  04/02/2018  After extensive coaching inhaler device  effectiveness =    75% (short Ti)  -  MCT  04/11/2018  Pos > rec increase symb to 160 2bid  -  06/12/2018 informed pt actually  on symb 80 2bid but doing well enough to continue as is  -  10/28/2018  After extensive coaching inhaler device,  effectiveness =    25% (poor insp effort/trigger)    Luckily she does not appear to have much in the way of an asthma flare at this point and I'm not sure if any of her cough chronically  really represents cough variant asthma but main concern is poor insight into use of hfa in general and understand her action plan which was again outlined in detail using teach back   No need for escalation of care at this point but needs to return with all meds in hand using a trust but verify approach to confirm accurate Medication  Reconciliation The principal here is that until we are certain that the  patients are doing what we've asked, it makes no sense to ask them to do more.

## 2018-10-28 NOTE — Assessment & Plan Note (Signed)
Body mass index is 48.1 kg/m.  -  trending down  Lab Results  Component Value Date   TSH 1.860 04/11/2018     Contributing to gerd risk/ doe/reviewed the need and the process to achieve and maintain neg calorie balance > defer f/u primary care including intermittently monitoring thyroid status      I had an extended discussion with the patient reviewing all relevant studies completed to date and  lasting 15 to 20 minutes of a 25 minute visit    See device teaching which extended face to face time for this visit.  Each maintenance medication was reviewed in detail including emphasizing most importantly the difference between maintenance and prns and under what circumstances the prns are to be triggered using an action plan format that is not reflected in the computer generated alphabetically organized AVS which I have not found useful in most complex patients, especially with respiratory illnesses  Please see AVS for specific instructions unique to this visit that I personally wrote and verbalized to the the pt in detail and then reviewed with pt  by my nurse highlighting any  changes in therapy recommended at today's visit to their plan of care.

## 2018-10-28 NOTE — Progress Notes (Signed)
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[place] with last IUP in 1972 pna mid term then 1980s onset of dry cough/ wheeze/ hoarseness with allergy testing pos Ragweed/ grass / dog/ cat never on shots some better with multiple rx but even prednisone did not correct the problem and took it for several years on asmacort with poor activity tol/ hoarseness s much variability even p MI so referred to pulmonary clinic 10/25/2017 by Dr   Percival Spanish with Pos MCT dx of asthma 04/11/18     History of Present Illness  10/25/2017 1st [facility] office visit/ [name]   On advair x sev years Chief Complaint  Patient presents with  . Pulmonary consult    Dr. Percival Spanish referred pt for SOB, diagnosed with asthma but SOb with exertion for years, dry cough, acid reflux  at hs tends cough and wakes sev times at night x sev years/ on cpap helps some Feels rested and not symptomatic first thing in am then starts the throat clearing and downhill from there the rest of the day Was on ACEi > d/c 05/02/17 and changed to ARB = losartan rec Stop advair and start symbicort 80 Take 2 puffs first thing in am and then another 2 puffs about 12 hours later. Be sure to continue nexium 40 mg Take 30- 60 min before your first and last meals of the day  Only use your albuterol as a rescue medication GERD diet  Please remember to go to the lab and x-ray department downstairs in the basement  for your tests - we will call you with the results when they are available.  Please schedule a follow up office visit in 4 weeks, sooner if needed  with all medications /inhalers/ solutions in hand so we can verify exactly what you are taking. This includes all medications from all doctors and over the counters  add needs cxr on return   11/30/2017  f/u ov/Zafir Schauer re:  Cough x 1980 ? Cough variant asthma/ uacs / did  not bring meds because thinks  "they are as listed" (they are not)  Chief Complaint  Patient presents with  . Follow-up    SOB is worse then last ov    Dyspnea:  Back stops before breathing so uses scooter when goes out Cough: daytime continuous urge to clear throat Sleep: fine on cpap SABA use:  Only uses nebulizer 3 x week - list says she has proair but pt denies though "that list is right"  On fosfamax x years rec Stop fosfamax Plan A = Automatic =  symbicort 80 Take 2 puffs first thing in am and then another 2 puffs about 12 hours later.  Work on Interior and spatial designer:   Plan B = Backup Only use your albuterol nebulizer as a rescue medication to be used if you can't catch your breath by resting or doing a relaxed purse lip breathing pattern.  - The less you use it, the better it will work when you need it. - Ok to use the inhaler up to    every 4 hours if you must but call for appointment if use goes up over your usual need  GERD es. For drainage / throat tickle try take CHLORPHENIRAMINE  4 mg - take one every 4 hours as needed - available  over the counter- may cause drowsiness so start with just a bedtime dose or two and see how you tolerate it before trying in daytime    Please remember to go to the  x-ray department downstairs in the basement  for your tests - we will call you with the results when they are available.    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 Follow-up    PFT's done today. Breathing is unchanged. She is still clearing her throat alot. She is using her albuterol neb 2-3 x per wk.   Dyspnea: scooter for back pain > sob  Cough: constant throat clearing  Sleep: cough keeps her up despite 2 pillows, not using 1st gen H1 blockers per guidelines   SABA use:   2- 3 x per week but doesn't help cough  rec Work on inhaler technique:  relax and gently blow all the way out  then take a nice smooth deep breath back in, triggering the inhaler at same time you start breathing in.  Hold for up to 5 seconds if you can. Blow out thru nose. Rinse and gargle with water when done Please schedule a follow up office visit in 6 weeks, call sooner if needed with all medications /inhalers/ solutions in hand        04/02/2018  f/u ov/[name] re:   uacs vs asthma  Chief Complaint  Patient presents with  . Follow-up    Breathing has improved some. She states she uses her albuterol inhaler once daily on average and neb about 2 x per wk.  Dyspnea:  Worse one week prior to OV  Then better s obvious pattern  Cough: globus sensation has been a constant daytime c/o since ? "for a minute" (never responded specifically)  SABA use: once daily  02: none    rec Start gabapentin 100 mg three times daily  Hold symbicort x 24 hours prior to the asthma challenge and albuterol for at least 6 hours prior  Rec: MCT  04/11/2018  Pos > rec increase symb to 160 2bid    04/30/18 NP/ [name] med review rec Begin Delsym 2 tsp Twice daily  For cough As needed   Begin Allegra 180mg  daily in am  Begin Chlorpheniramine 4mg  At bedtime  .  Go ahead and start Gabapentin 100mg  Three times a day   Follow med calendar closely and bring to each visit .  Continue on Symbicort 2 puffs Twice daily  , rinse well after use   06/11/2018  f/u ov/[name] re:  Cough variant asthma  No  Med calendar but clearly better / not sure which dose of symb she has or name of new HA pill but hasn't started yet  Chief Complaint  Patient presents with  . Follow-up    She states she feels like her throat is "full of mucus this morning". She states she has not been clearing her throat much, but she is today. She states she occ produces some clear to yellow sputum in the am's. She is using her proair inhaler 3-4 x per wk and she has not needed neb.  Dyspnea:  100 ft then sob walking but water ex is fine up to an hour  Cough: resolved     Sleeping: L side bed/ 2 pillows/ cpap per [place] SABA use: as above   02: none   rec No change  in medicatoins - follow the med calendar as written  Call us with name of headache medication and strength of your symbicort  Please schedule a follow up office visit in 6 weeks, call sooner if needed with all medications /inhalers/ solutions in hand    07/23/2018  f/u ov/Kemiya Batdorf re: cough variant asthma, did not bring meds  Chief Complaint  Patient presents with  . Follow-up    Increased cough x 2 wks- non prod. She has some sinus pressure, HA and nasal dryness. She is using her albuterol inhaler 1 to 2 x per wk. She uses her neb about once per month.   Dyspnea:  Walking with cane across office Cough: worse x 2 weeks/ dry day > noct  Sleeping: on cpap on side / flat bed and 2 pillows  SABA use: min  rec Change chlorpheniramine to where you take x 2 one hour before  bedtime  For drainage / throat tickle try take CHLORPHENIRAMINE  4 mg - take one every 4 hours as needed - available over the counter-  Please schedule a follow up office visit in 3 months, call sooner if needed with all medications /inhalers/ solutions in hand so we can verify exactly what you are taking. This includes all medications from all doctors and over the [place] separate them into two bags:  the ones you take automatically, no matter what, vs the ones you take just when you feel you need them "BAG #2 is UP TO YOU"  - this will really help [place] help you take your medications more effectively.     10/28/2018  f/u ov/Lorrayne Ismael re: no meds, no med calendar,  no rescue inhaler on hand  / only sample of symb which she does know how to use  Chief Complaint  Patient presents with  . Follow-up    Cough has been worse over the past 7-10 days. She states "cough is deep"- non prod. She is using her albuterol inhaler daily and she uses neb once per wk.   Dyspnea:  60 ft / limited by foot L and back Cough: was gone until 10 days  prior to OV  With st/ n and v and diarrhea  Sleeping: ok cpap/ no 02  SABA use: as above on symb 80 2bid  02: none / considering bipap per Dr Radford Pax    No obvious day to day or daytime variability or assoc excess/ purulent sputum or mucus plugs or hemoptysis or cp or chest tightness, subjective wheeze or overt sinus or hb symptoms.   Sleeping as above  without nocturnal  or early am exacerbation  of respiratory  c/o's or need for noct saba. Also denies any obvious fluctuation of symptoms with weather or environmental changes or other aggravating or alleviating factors except as outlined above   No unusual exposure hx or h/o childhood pna/ asthma or knowledge of premature birth.  Current Allergies, Complete Past Medical History, Past Surgical History, Family History, and Social History were reviewed in [facility] record.  ROS  The following are not active complaints unless bolded Hoarseness, sore throat, dysphagia, dental problems, itching, sneezing,  nasal congestion or discharge of excess mucus or purulent secretions, ear ache,   fever, chills, sweats, unintended wt loss or wt gain, classically pleuritic or exertional cp,  orthopnea pnd or arm/hand swelling  or leg swelling, presyncope, palpitations, abdominal pain midline only with cough, anorexia, nausea, vomiting, diarrhea  or change in bowel habits or change in  bladder habits, change in stools or change in urine, dysuria, hematuria,  rash, arthralgias, visual complaints, headache, numbness, weakness or ataxia or problems with walking or coordination,  change in mood or  memory.        Current Meds - - NOTE:   Unable to verify as accurately reflecting what pt takes     Medication Sig  . albuterol (PROVENTIL) (2.5 MG/3ML) 0.083% nebulizer solution Take 3 mLs (2.5 mg total) by nebulization every 6 (six) hours as needed for wheezing or shortness of breath.  Marland Kitchen albuterol (VENTOLIN HFA) 108 (90 Base) MCG/ACT inhaler Inhale  1 puff into the lungs every 6 (six) hours as needed for wheezing or shortness of breath.  . Atorvastatin Calcium (LIPITOR PO) Take 1 tablet by mouth daily. Unsure of strength  . budesonide-formoterol (SYMBICORT) 80-4.5 MCG/ACT inhaler Inhale 2 puffs into the lungs 2 (two) times daily.  . chlorpheniramine (CHLOR-TRIMETON) 4 MG tablet Take 4 mg by mouth 2 (two) times daily as needed for allergies.  Marland Kitchen clopidogrel (PLAVIX) 75 MG tablet Take 1 tablet (75 mg total) by mouth daily.  . cyclobenzaprine (FLEXERIL) 5 MG tablet Take 1-2 tablets (5-10 mg total) by mouth at bedtime.  Marland Kitchen dextromethorphan (DELSYM) 30 MG/5ML liquid Take 15 mg by mouth 2 (two) times daily as needed for cough.  . esomeprazole (NEXIUM) 40 MG capsule Take 40 mg by mouth 2 (two) times daily before a meal.  . fexofenadine (ALLEGRA) 180 MG tablet Take 180 mg by mouth daily.  . flurbiprofen (ANSAID) 50 MG tablet Take 1 tablet every 8 hours as needed, maximum 3 tablets/24 hours  . furosemide (LASIX) 20 MG tablet Take 2 tablets (40 mg total) by mouth daily.  Marland Kitchen gabapentin (NEURONTIN) 100 MG capsule TAKE 1 CAPSULE (100 MG TOTAL) BY MOUTH 3 (THREE) TIMES DAILY.  . isosorbide dinitrate (ISORDIL) 30 MG tablet Take 30 mg by mouth daily.  Marland Kitchen loratadine (CLARITIN) 10 MG tablet Take 10 mg by mouth daily.  Marland Kitchen losartan (COZAAR) 100 MG tablet Take 1 tablet (100 mg total) daily by mouth.  . mirabegron ER (MYRBETRIQ) 50 MG TB24 tablet Take 50 mg by mouth daily.  Marland Kitchen morphine (MS CONTIN) 15 MG 12 hr tablet Take 1 tablet (15 mg total) by mouth daily as needed for pain.  Marland Kitchen oxybutynin (DITROPAN-XL) 10 MG 24 hr tablet Take 10 mg by mouth at bedtime.  Marland Kitchen oxyCODONE-acetaminophen (PERCOCET/ROXICET) 5-325 MG tablet Take 1 tablet by mouth daily as needed for severe pain.  Marland Kitchen Propylene Glycol (SYSTANE BALANCE) 0.6 % SOLN Apply 1 drop to eye as needed (dry eyes).  Marland Kitchen tiZANidine (ZANAFLEX) 4 MG tablet TAKE 1&1/2 TABLETS DAILY                  Objective:   Physical  Exam  Obese bf arrived on rollator scooter is broken /incessant throat clearing   10/28/2018         298  07/23/2018     312  06/11/2018     304  04/02/2018         312  01/04/2018        299  11/30/2017          297   10/25/17 295 lb (133.8 kg)  10/08/17 292 lb (132.5 kg)  09/28/17 290 lb 3.2 oz (131.6 kg)    Vital signs reviewed - Note on arrival 02 sats  99% on RA           HEENT: Top turbinates  bilaterally, and pristine  oropharynx. Nl external ear canals without cough reflex   NECK :  without JVD/Nodes/TM/ nl carotid upstrokes bilaterally   LUNGS: no acc muscle use,  Nl contour chest which is clear to A and P bilaterally without cough on insp or exp maneuvers   CV:  RRR  no s3 or murmur or increase in P2, and  Trace to 1+ pitting edema both ankles    ABD:  Massively obese/  d nontender with limited inspiratory excursion in the supine position. No bruits or organomegaly appreciated, bowel sounds nl  MS:  Nl gait/ ext warm without deformities, calf tenderness, cyanosis or clubbing No obvious joint restrictions   SKIN: warm and dry without lesions    NEURO:  alert, approp, nl sensorium with  no motor or cerebellar deficits apparent.        cxr at Dr Thompson Caul office in Feb 2020 ok per pt/ not on file     Assessment & Plan:

## 2018-10-28 NOTE — Assessment & Plan Note (Signed)
Speech therapy eval 04/06/17 > neg but did have report of globus then  Try off fosfamax 11/30/2017 >>> - 01/04/2018 added 1st gen H1 blockers per guidelines - Sinus CT 04/03/2018  Neg sinusitis/ retention cyst R max sinus - gabapentin trial 04/02/2018 > improved 06/11/2018      Of the three most common causes of  Sub-acute / recurrent or chronic cough, only one (GERD)  can actually contribute to/ trigger  the other two (asthma and post nasal drip syndrome)  and perpetuate the cylce of cough.  While not intuitively obvious, many patients with chronic low grade reflux do not cough until there is a primary insult that disturbs the protective epithelial barrier and exposes sensitive nerve endings.   This is typically viral but can due to PNDS and  either may apply here.   The point is that once this occurs, it is difficult to eliminate the cycle  using anything but a maximally effective acid suppression regimen at least in the short run, accompanied by an appropriate diet to address non acid GERD and control / eliminate the cough itself with delsym and if not effective then titrate gabapentin as high as 300 mg tid in the short run then back to 100 mg tid/ advised

## 2018-10-28 NOTE — Patient Instructions (Signed)
Plan A = Automatic =  Symbicort 80 Take 2 puffs first thing in am and then another 2 puffs about 12 hours later.    Work on inhaler technique:  relax and gently blow all the way out then take a nice smooth deep breath back in, triggering the inhaler at same time you start breathing in.  Hold for up to 5 seconds if you can. Blow out thru nose. Rinse and gargle with water when done   Plan B = Backup Only use your albuterol inhaler as a rescue medication to be used if you can't catch your breath by resting or doing a relaxed purse lip breathing pattern.  - The less you use it, the better it will work when you need it. - Ok to use the inhaler up to 2 puffs  every 4 hours if you must but call for appointment if use goes up over your usual need - Don't leave home without it !!  (think of it like the spare tire for your car)   Plan C = Crisis - only use your albuterol nebulizer if you first try Plan B and it fails to help > ok to use the nebulizer up to every 4 hours but if start needing it regularly call for immediate appointment   For cough take delsym per bottle instructions and if can't stop we can increase your gabapentin 300mg  three times a day   Please schedule a follow up office visit in 3 months , call sooner if needed with all medications /inhalers/ solutions in hand so we can verify exactly what you are taking. This includes all medications from all doctors and over the counters

## 2018-10-29 NOTE — Telephone Encounter (Signed)
Patient has a 10 week follow up appointment scheduled for 01/17/19 at 10:20.. Patient understands she needs to keep this appointment for insurance compliance. Patient was grateful for the call and thanked me.

## 2018-11-10 ENCOUNTER — Other Ambulatory Visit: Payer: Self-pay | Admitting: Internal Medicine

## 2018-11-18 ENCOUNTER — Ambulatory Visit: Payer: Medicare Other | Admitting: Podiatry

## 2018-11-25 ENCOUNTER — Ambulatory Visit: Payer: Medicare Other | Admitting: Podiatry

## 2018-12-10 ENCOUNTER — Emergency Department (HOSPITAL_COMMUNITY): Payer: Medicare HMO

## 2018-12-10 ENCOUNTER — Encounter (HOSPITAL_COMMUNITY): Payer: Self-pay | Admitting: *Deleted

## 2018-12-10 ENCOUNTER — Other Ambulatory Visit: Payer: Self-pay

## 2018-12-10 ENCOUNTER — Emergency Department (HOSPITAL_COMMUNITY)
Admission: EM | Admit: 2018-12-10 | Discharge: 2018-12-10 | Disposition: A | Discharge status: Home / Self Care | Payer: Medicare HMO | Attending: Emergency Medicine | Admitting: Emergency Medicine

## 2018-12-10 ENCOUNTER — Telehealth: Payer: Self-pay | Admitting: *Deleted

## 2018-12-10 DIAGNOSIS — J4521 Mild intermittent asthma with (acute) exacerbation: Secondary | ICD-10-CM | POA: Diagnosis not present

## 2018-12-10 DIAGNOSIS — M5431 Sciatica, right side: Secondary | ICD-10-CM | POA: Diagnosis not present

## 2018-12-10 DIAGNOSIS — R0602 Shortness of breath: Secondary | ICD-10-CM | POA: Diagnosis not present

## 2018-12-10 DIAGNOSIS — M5441 Lumbago with sciatica, right side: Secondary | ICD-10-CM | POA: Diagnosis not present

## 2018-12-10 DIAGNOSIS — Z7984 Long term (current) use of oral hypoglycemic drugs: Secondary | ICD-10-CM | POA: Insufficient documentation

## 2018-12-10 DIAGNOSIS — G8929 Other chronic pain: Secondary | ICD-10-CM | POA: Diagnosis not present

## 2018-12-10 DIAGNOSIS — M5432 Sciatica, left side: Secondary | ICD-10-CM | POA: Diagnosis not present

## 2018-12-10 DIAGNOSIS — Z20828 Contact with and (suspected) exposure to other viral communicable diseases: Secondary | ICD-10-CM | POA: Diagnosis not present

## 2018-12-10 DIAGNOSIS — Z791 Long term (current) use of non-steroidal anti-inflammatories (NSAID): Secondary | ICD-10-CM | POA: Insufficient documentation

## 2018-12-10 DIAGNOSIS — Z7902 Long term (current) use of antithrombotics/antiplatelets: Secondary | ICD-10-CM | POA: Insufficient documentation

## 2018-12-10 DIAGNOSIS — Z79899 Other long term (current) drug therapy: Secondary | ICD-10-CM | POA: Insufficient documentation

## 2018-12-10 DIAGNOSIS — R05 Cough: Secondary | ICD-10-CM | POA: Diagnosis not present

## 2018-12-10 DIAGNOSIS — I10 Essential (primary) hypertension: Secondary | ICD-10-CM | POA: Diagnosis not present

## 2018-12-10 DIAGNOSIS — R079 Chest pain, unspecified: Secondary | ICD-10-CM | POA: Diagnosis not present

## 2018-12-10 DIAGNOSIS — M5442 Lumbago with sciatica, left side: Secondary | ICD-10-CM | POA: Diagnosis not present

## 2018-12-10 DIAGNOSIS — I2510 Atherosclerotic heart disease of native coronary artery without angina pectoris: Secondary | ICD-10-CM | POA: Diagnosis not present

## 2018-12-10 LAB — POCT I-STAT EG7
Acid-Base Excess: 3 mmol/L — ABNORMAL HIGH (ref 0.0–2.0)
Bicarbonate: 28.9 mmol/L — ABNORMAL HIGH (ref 20.0–28.0)
Calcium, Ion: 1.15 mmol/L (ref 1.15–1.40)
HCT: 38 % (ref 36.0–46.0)
Hemoglobin: 12.9 g/dL (ref 12.0–15.0)
O2 Saturation: 74 %
Potassium: 4.5 mmol/L (ref 3.5–5.1)
Sodium: 142 mmol/L (ref 135–145)
TCO2: 30 mmol/L (ref 22–32)
pCO2, Ven: 46.2 mmHg (ref 44.0–60.0)
pH, Ven: 7.404 (ref 7.250–7.430)
pO2, Ven: 40 mmHg (ref 32.0–45.0)

## 2018-12-10 LAB — COMPREHENSIVE METABOLIC PANEL
ALT: 21 U/L (ref 0–44)
AST: 19 U/L (ref 15–41)
Albumin: 3.4 g/dL — ABNORMAL LOW (ref 3.5–5.0)
Alkaline Phosphatase: 114 U/L (ref 38–126)
Anion gap: 8 (ref 5–15)
BUN: 12 mg/dL (ref 8–23)
CO2: 25 mmol/L (ref 22–32)
Calcium: 9 mg/dL (ref 8.9–10.3)
Chloride: 109 mmol/L (ref 98–111)
Creatinine, Ser: 0.89 mg/dL (ref 0.44–1.00)
GFR calc Af Amer: >60 mL/min (ref >60)
GFR calc non Af Amer: >60 mL/min (ref >60)
Glucose, Bld: 149 mg/dL — ABNORMAL HIGH (ref 70–99)
Potassium: 3.9 mmol/L (ref 3.5–5.1)
Sodium: 142 mmol/L (ref 135–145)
Total Bilirubin: 0.8 mg/dL (ref 0.3–1.2)
Total Protein: 7.2 g/dL (ref 6.5–8.1)

## 2018-12-10 LAB — CBC WITH DIFFERENTIAL/PLATELET
Abs Immature Granulocytes: 0.02 10*3/uL (ref 0.00–0.07)
Basophils Absolute: 0.1 10*3/uL (ref 0.0–0.1)
Basophils Relative: 1 %
Eosinophils Absolute: 0.2 10*3/uL (ref 0.0–0.5)
Eosinophils Relative: 3 %
HCT: 41.3 % (ref 36.0–46.0)
Hemoglobin: 13.3 g/dL (ref 12.0–15.0)
Immature Granulocytes: 0 %
Lymphocytes Relative: 35 %
Lymphs Abs: 2.3 10*3/uL (ref 0.7–4.0)
MCH: 28.5 pg (ref 26.0–34.0)
MCHC: 32.2 g/dL (ref 30.0–36.0)
MCV: 88.4 fL (ref 80.0–100.0)
Monocytes Absolute: 0.5 10*3/uL (ref 0.1–1.0)
Monocytes Relative: 8 %
Neutro Abs: 3.5 10*3/uL (ref 1.7–7.7)
Neutrophils Relative %: 53 %
Platelets: 215 10*3/uL (ref 150–400)
RBC: 4.67 MIL/uL (ref 3.87–5.11)
RDW: 14.1 % (ref 11.5–15.5)
WBC: 6.6 10*3/uL (ref 4.0–10.5)
nRBC: 0 % (ref 0.0–0.2)

## 2018-12-10 LAB — D-DIMER, QUANTITATIVE: D-Dimer, Quant: 1.21 ug/mL-FEU — ABNORMAL HIGH (ref 0.00–0.50)

## 2018-12-10 LAB — TROPONIN I: Troponin I: <0.03 ng/mL (ref <0.03)

## 2018-12-10 LAB — SARS CORONAVIRUS 2 BY RT PCR (HOSPITAL ORDER, PERFORMED IN ~~LOC~~ HOSPITAL LAB): SARS Coronavirus 2: NEGATIVE

## 2018-12-10 MED ORDER — IOHEXOL 350 MG/ML SOLN
100.0000 mL | Freq: Once | INTRAVENOUS | Status: AC | PRN
  Administered 2018-12-10: 100 mL via INTRAVENOUS

## 2018-12-10 MED ORDER — SODIUM CHLORIDE (PF) 0.9 % IJ SOLN
INTRAMUSCULAR | Status: AC
  Filled 2018-12-10: qty 50

## 2018-12-10 MED ORDER — PREDNISONE 20 MG PO TABS: ORAL_TABLET | ORAL | 0 refills | Status: DC

## 2018-12-10 MED ORDER — ALBUTEROL SULFATE HFA 108 (90 BASE) MCG/ACT IN AERS
2.0000 | INHALATION_SPRAY | Freq: Once | RESPIRATORY_TRACT | Status: AC
  Administered 2018-12-10: 2 via RESPIRATORY_TRACT
  Filled 2018-12-10: qty 6.7

## 2018-12-10 MED ORDER — SODIUM CHLORIDE 0.9 % IV BOLUS
1000.0000 mL | Freq: Once | INTRAVENOUS | Status: AC
  Administered 2018-12-10: 1000 mL via INTRAVENOUS

## 2018-12-10 MED ORDER — CYCLOBENZAPRINE HCL 10 MG PO TABS
5.0000 mg | ORAL_TABLET | Freq: Once | ORAL | Status: AC
  Administered 2018-12-10: 5 mg via ORAL
  Filled 2018-12-10: qty 1

## 2018-12-10 MED ORDER — METHYLPREDNISOLONE SODIUM SUCC 125 MG IJ SOLR
125.0000 mg | Freq: Once | INTRAMUSCULAR | Status: AC
  Administered 2018-12-10: 125 mg via INTRAVENOUS
  Filled 2018-12-10: qty 2

## 2018-12-10 MED ORDER — CYCLOBENZAPRINE HCL 5 MG PO TABS: 5.0000 mg | ORAL_TABLET | Freq: Three times a day (TID) | ORAL | 0 refills | Status: DC | PRN

## 2018-12-10 NOTE — ED Notes (Signed)
Made CT aware IV was placed.

## 2018-12-10 NOTE — ED Provider Notes (Signed)
[facility] DEPT Provider Note   CSN: [id number] Arrival date & time: 12/10/18  1148    History   Chief Complaint Chief Complaint  Patient presents with  . Shortness of Breath  . Cough  . Chest Pain    HPI Alisha [name] is a 68 y.o. female history of CAD status post stents, here presenting with shortness of breath.  States that she traveled to [place] recently and came back home about 2 weeks ago.  She went to [place] to visit her grandchildren and denies being around sick contacts.  Although, she states that 1 of her family members in [place] works for the hospital but was tested negative at that time.  She has been under self quarantine for the last 2 weeks.  Over the last week or so, patient has been having worsening shortness of breath and body aches and low-grade temperature.  She states that every time she walks to the kitchen, she gets she subjectively short of breath.  She denies any productive cough or leg swelling.  Of note, patient does have a history of asthma as well as previous blood clots in the past.      The history is provided by the patient.    Past Medical History:  Diagnosis Date  . Arthritis   . Asthma   . Bipolar disorder ([place])   . CAD (coronary artery disease) 07/07/2016   a. s/p stenting x 2 in [place] to the LAD, RCA is chronically occluded, LVEDP was 34  . Cancer (HCC)    cervical  . Depression   . Fibromyalgia   . GERD (gastroesophageal reflux disease)   . History of benign pituitary tumor   . History of kidney stones    1972  . Hyperlipidemia   . Hypertension   . Migraine   . Morbid obesity ([place])   . Peripheral vascular disease ([place])   . Pneumonia   . Seizures ([place])    during surgery for pituitary tumor  . Sleep apnea    with cpap  . Vertigo     Patient Active Problem List   Diagnosis Date Noted  . Recurrent falls 09/22/2018  . Chronic use of opiate drug for therapeutic purpose 04/11/2018  . Bradycardia  04/11/2018  . Chronic fatigue 04/11/2018  . Prediabetes 12/17/2017  . Mild cognitive impairment 12/13/2017  . Cough variant asthma vs UACS 10/26/2017  . Upper airway cough syndrome 10/25/2017  . Medial epicondylitis, right 09/16/2017  . Right shoulder pain 09/07/2017  . Lateral epicondylitis of right elbow 09/07/2017  . Vitamin D deficiency 07/13/2017  . History of foot fracture 07/12/2017  . History of colon polyps 07/12/2017  . Cataract of both eyes 07/11/2017  . Hypertensive retinopathy of both eyes 07/11/2017  . Fibromyalgia 05/17/2017  . Tenosynovitis, wrist 05/14/2017  . Chronic cough 05/02/2017  . Foot pain, left 05/02/2017  . Dysphagia 03/23/2017  . Health care maintenance 03/23/2017  . Coronary artery disease involving native coronary artery of native heart without angina pectoris 01/11/2017  . Hyperlipidemia 01/11/2017  . Polypharmacy 11/25/2016  . Venous stasis of both lower extremities 10/04/2016  . Poor dentition 10/04/2016  . OSA (obstructive sleep apnea) 10/04/2016  . History of nephrolithiasis 10/04/2016  . Hx of pilonidal cyst 10/04/2016  . Bipolar depression ([place]) 10/04/2016  . Personal history of DVT (deep vein thrombosis) 10/04/2016  . Morbid obesity due to excess calories ([place]) 09/25/2016  . History of pituitary adenoma s/p resection 09/25/2016  . Essential  hypertension 09/25/2016  . Psoriatic arthritis ([place]) 09/25/2016  . Vision impairment 09/25/2016  . Chronic seasonal allergic rhinitis due to pollen 09/25/2016  . Moderate persistent asthma without complication 27/74/1287  . Gastroesophageal reflux disease without esophagitis 09/25/2016  . Urge incontinence 09/25/2016  . Chronic pain syndrome 09/25/2016  . Candidal intertrigo 05/10/2010    Past Surgical History:  Procedure Laterality Date  . ANKLE FUSION Bilateral   . carpel tunnel release Bilateral 2017  . CORONARY ANGIOPLASTY    . ESOPHAGOGASTRODUODENOSCOPY (EGD) WITH PROPOFOL N/A 06/14/2017    Procedure: ESOPHAGOGASTRODUODENOSCOPY (EGD) WITH PROPOFOL;  Surgeon: Wilford Corner, MD;  Location: [facility];  Service: Endoscopy;  Laterality: N/A;  . EXCISION / CURETTAGE BONE CYST PHALANGES OF FOOT  2014   Removal of foot cyst   . PARTIAL HYSTERECTOMY  unknown   Patient still has ovaries  . pituitary tumor removal  1999   removed as much as they could, on optic nerve  . TONSILLECTOMY    . TOTAL KNEE ARTHROPLASTY     TKR X 3  2 on the left and 1 on the right     OB History   No obstetric history on file.      Home Medications    Prior to Admission medications   Medication Sig Start Date End Date Taking? Authorizing Provider  albuterol (PROVENTIL) (2.5 MG/3ML) 0.083% nebulizer solution Take 3 mLs (2.5 mg total) by nebulization every 6 (six) hours as needed for wheezing or shortness of breath. 11/10/16  Yes Briscoe Deutscher, DO  albuterol (VENTOLIN HFA) 108 (90 Base) MCG/ACT inhaler Inhale 1 puff into the lungs every 6 (six) hours as needed for wheezing or shortness of breath.   Yes [provider]  budesonide-formoterol (SYMBICORT) 80-4.5 MCG/ACT inhaler Inhale 2 puffs into the lungs 2 (two) times daily. 01/28/18  Yes Lucious Groves, DO  chlorpheniramine (CHLOR-TRIMETON) 4 MG tablet Take 4 mg by mouth 2 (two) times daily as needed for allergies.   Yes [provider]  clopidogrel (PLAVIX) 75 MG tablet Take 1 tablet (75 mg total) by mouth daily. 10/18/18  Yes Tomi Likens, Adam R, DO  dextromethorphan (DELSYM) 30 MG/5ML liquid Take 15 mg by mouth 2 (two) times daily as needed for cough.   Yes [provider]  diclofenac (CATAFLAM) 50 MG tablet Take 50 mg by mouth 3 (three) times daily. 10/28/18  Yes [provider]  diclofenac sodium (VOLTAREN) 1 % GEL Apply 4 g topically 4 (four) times daily. 11/19/18  Yes [provider]  DULoxetine (CYMBALTA) 60 MG capsule Take 1 capsule (60 mg total) by mouth daily. Patient taking differently: Take 60 mg by  mouth daily as needed (anxiety).  05/24/17 12/10/18 Yes Lucious Groves, DO  esomeprazole (NEXIUM) 40 MG capsule Take 40 mg by mouth 2 (two) times daily before a meal.   Yes [provider]  fexofenadine (ALLEGRA) 180 MG tablet Take 180 mg by mouth daily.   Yes [provider]  flurbiprofen (ANSAID) 50 MG tablet Take 1 tablet every 8 hours as needed, maximum 3 tablets/24 hours Patient taking differently: Take 50 mg by mouth 3 (three) times daily as needed (pain/arthritis). Take 1 tablet every 8 hours as needed, maximum 3 tablets/24 hours 09/16/18  Yes Jaffe, Adam R, DO  furosemide (LASIX) 20 MG tablet Take 2 tablets (40 mg total) by mouth daily. 10/08/17  Yes Minus Breeding, MD  gabapentin (NEURONTIN) 100 MG capsule TAKE 1 CAPSULE BY MOUTH 3 TIMES DAILY Patient taking  differently: Take 100 mg by mouth 3 (three) times daily.  11/11/18  Yes Tanda Rockers, MD  isosorbide dinitrate (ISORDIL) 30 MG tablet Take 30 mg by mouth daily.   Yes [provider]  loratadine (CLARITIN) 10 MG tablet Take 10 mg by mouth daily. 05/28/18  Yes [provider]  losartan (COZAAR) 100 MG tablet Take 1 tablet (100 mg total) daily by mouth. 07/06/17  Yes Lucious Groves, DO  metFORMIN (GLUCOPHAGE) 500 MG tablet Take 500 mg by mouth 2 (two) times daily. 11/19/18  Yes [provider]  mirabegron ER (MYRBETRIQ) 50 MG TB24 tablet Take 50 mg by mouth daily.   Yes [provider]  montelukast (SINGULAIR) 10 MG tablet Take 10 mg by mouth daily. 11/19/18  Yes [provider]  oxybutynin (DITROPAN XL) 15 MG 24 hr tablet Take 15 mg by mouth daily. 11/19/18  Yes [provider]  Propylene Glycol (SYSTANE BALANCE) 0.6 % SOLN Apply 1 drop to eye as needed (dry eyes).   Yes [provider]  rosuvastatin (CRESTOR) 40 MG tablet Take 40 mg by mouth daily.   Yes [provider]  tiZANidine (ZANAFLEX) 4 MG tablet Take 6 mg by mouth daily.  05/14/18  Yes  [provider]  cyclobenzaprine (FLEXERIL) 5 MG tablet Take 1 tablet (5 mg total) by mouth 3 (three) times daily as needed for muscle spasms. 12/10/18   Drenda Freeze, MD  morphine (MS CONTIN) 15 MG 12 hr tablet Take 1 tablet (15 mg total) by mouth daily as needed for pain. Patient not taking: Reported on 12/10/2018 04/19/18   Lorella Nimrod, MD  predniSONE (DELTASONE) 20 MG tablet Take 60 mg daily x 2 days then 40 mg daily x 2 days then 20 mg daily x 2 days 12/10/18   Drenda Freeze, MD    Family History Family History  Problem Relation Age of Onset  . Heart disease Mother 66       CABG, pacemaker, valve  . Cancer Father   . Heart disease Sister        Fluid around the heart  . Breast cancer Neg Hx     Social History Social History   Tobacco Use  . Smoking status: Never Smoker  . Smokeless tobacco: Never Used  Substance Use Topics  . Alcohol use: Yes    Comment: Socially. [name].  . Drug use: No     Allergies   Ibuprofen; Ampicillin; Asa [aspirin]; Darvon [propoxyphene]; Demeclocycline; Eggs or egg-derived products; Lactalbumin; Lisinopril; Milk-related compounds; Salicylates; Tetracycline hcl; and Tetracyclines & related   Review of Systems Review of Systems  Respiratory: Positive for cough and shortness of breath.   Cardiovascular: Positive for chest pain.  All other systems reviewed and are negative.    Physical Exam Updated Vital Signs BP (!) 166/74 (BP Location: Right Arm)   Pulse 60   Temp 98.4 F (36.9 C) (Oral)   Resp 15   SpO2 100%   Physical Exam Vitals signs and nursing note reviewed.  Constitutional:      Comments: Slightly anxious   HENT:     Head: Normocephalic.  Eyes:     Pupils: Pupils are equal, round, and reactive to light.  Neck:     Musculoskeletal: Normal range of motion.  Cardiovascular:     Rate and Rhythm: Normal rate and regular rhythm.  Pulmonary:     Comments: Slightly tachypneic, minimal wheezing  throughout, no retractions, not in distress  Abdominal:     Palpations: Abdomen is soft.  Musculoskeletal: Normal range of motion.  Skin:    General: Skin is warm.     Capillary Refill: Capillary refill takes less than 2 seconds.  Neurological:     General: No focal deficit present.     Mental Status: She is alert and oriented to person, place, and time.  Psychiatric:        Mood and Affect: Mood normal.        Behavior: Behavior normal.      ED Treatments / Results  Labs (all labs ordered are listed, but only abnormal results are displayed) Labs Reviewed  COMPREHENSIVE METABOLIC PANEL - Abnormal; Notable for the following components:      Result Value   Glucose, Bld 149 (*)    Albumin 3.4 (*)    All other components within normal limits  D-DIMER, QUANTITATIVE (NOT AT [facility]) - Abnormal; Notable for the following components:   D-Dimer, Quant 1.21 (*)    All other components within normal limits  POCT I-STAT EG7 - Abnormal; Notable for the following components:   Bicarbonate 28.9 (*)    Acid-Base Excess 3.0 (*)    All other components within normal limits  SARS CORONAVIRUS 2 (HOSPITAL ORDER, [facility])  CBC WITH DIFFERENTIAL/PLATELET  TROPONIN I    EKG EKG Interpretation  Date/Time:  Tuesday December 10 2018 12:23:40 EDT Ventricular Rate:  65 PR Interval:    QRS Duration: 98 QT Interval:  431 QTC Calculation: 449 R Axis:   6 Text Interpretation:  Sinus rhythm No significant change since last tracing Confirmed by Wandra Arthurs [phone]) on 12/10/2018 12:36:31 PM   Radiology Dg Chest Port 1 View  Result Date: 12/10/2018 CLINICAL DATA:  Cough, shortness of breath, fever, and chest pain. EXAM: PORTABLE CHEST 1 VIEW COMPARISON:  04/11/2018 FINDINGS: The cardiomediastinal silhouette is unchanged with normal heart size. No airspace consolidation, edema, pleural effusion, pneumothorax is identified. No acute osseous abnormality is seen. IMPRESSION: No  active disease. Electronically Signed   By: Logan Bores M.D.   On: 12/10/2018 12:25    Procedures Procedures (including critical care time)  Medications Ordered in ED Medications  sodium chloride (PF) 0.9 % injection (has no administration in time range)  cyclobenzaprine (FLEXERIL) tablet 5 mg (has no administration in time range)  sodium chloride 0.9 % bolus 1,000 mL (1,000 mLs Intravenous New Bag/Given 12/10/18 1406)  methylPREDNISolone sodium succinate (SOLU-MEDROL) 125 mg/2 mL injection 125 mg (125 mg Intravenous Given 12/10/18 1404)  albuterol (PROVENTIL HFA;VENTOLIN HFA) 108 (90 Base) MCG/ACT inhaler 2 puff (2 puffs Inhalation Given 12/10/18 1324)  iohexol (OMNIPAQUE) 350 MG/ML injection 100 mL (100 mLs Intravenous Contrast Given 12/10/18 1628)     Initial Impression / Assessment and Plan / ED Course  I have reviewed the triage vital signs and the nursing notes.  Pertinent labs & imaging results that were available during my care of the patient were reviewed by me and considered in my medical decision making (see chart for details).        Arley Garant is a 68 y.o. female here with SOB. Has hx of asthma and recently traveled to [place]. Consider asthma exacerbation vs PE vs COVID. Will get labs, d-dimer, CXR. Will test for COVID given SOB and chills and recent travel.   4:58 PM D-dimer positive. CTA pending. If CTA showed no PE, will need to ambulate patient and if patient doesn't desat, she can be  discharged home. COVID is negative currently however, since sensitivity is only 70% will still have her self quarantine. Signed out to Dr. Ralene Bathe to follow up CT and reassess patient. Of note, patient now complains of low back pain which is chronic. Neurovascular intact bilateral lower extremities so will give flexeril for muscle spasms.       Final Clinical Impressions(s) / ED Diagnoses   Final diagnoses:  Mild intermittent asthma with exacerbation  Acute bilateral low back pain  with bilateral sciatica    ED Discharge Orders         Ordered    predniSONE (DELTASONE) 20 MG tablet     12/10/18 1644    cyclobenzaprine (FLEXERIL) 5 MG tablet  3 times daily PRN     12/10/18 1644           Drenda Freeze, MD 12/10/18 1659

## 2018-12-10 NOTE — ED Notes (Signed)
DR. Darl Householder checked IV access with U/s and stated iv was patent and placement was appropriate. Made CT tech aware.

## 2018-12-10 NOTE — ED Notes (Signed)
CT scan Tech stated that IV was infiltrated.RN to assess.

## 2018-12-10 NOTE — Discharge Instructions (Addendum)
You were tested for coronavirus and it is negative currently. However, the test is not perfect so you still need to self quarantine below.   Take prednisone as prescribed.   Use albuterol every 4 hrs as needed for cough   Take motrin for pain and flexeril for severe back pain   See your doctor for follow up   Return to ER if you have worse back pain, trouble breathing, fever.      Person Under Monitoring Name: Alisha [name]  Location: [place] [address]   Infection Prevention Recommendations for Individuals Confirmed to have, or Being Evaluated for, 2019 Novel Coronavirus (COVID-19) Infection Who Receive Care at Home  Individuals who are confirmed to have, or are being evaluated for, COVID-19 should follow the prevention steps below until a healthcare provider or local or state health department says they can return to normal activities.  Stay home except to get medical care You should restrict activities outside your home, except for getting medical care. Do not go to work, school, or public areas, and do not use public transportation or taxis.  Call ahead before visiting your doctor Before your medical appointment, call the healthcare provider and tell them that you have, or are being evaluated for, COVID-19 infection. This will help the healthcare providers office take steps to keep other people from getting infected. Ask your healthcare provider to call the local or state health department.  Monitor your symptoms Seek prompt medical attention if your illness is worsening (e.g., difficulty breathing). Before going to your medical appointment, call the healthcare provider and tell them that you have, or are being evaluated for, COVID-19 infection. Ask your healthcare provider to call the local or state health department.  Wear a facemask You should wear a facemask that covers your nose and mouth when you are in the same room with other people  and when you visit a healthcare provider. People who live with or visit you should also wear a facemask while they are in the same room with you.  Separate yourself from other people in your home As much as possible, you should stay in a different room from other people in your home. Also, you should use a separate bathroom, if available.  Avoid sharing household items You should not share dishes, drinking glasses, cups, eating utensils, towels, bedding, or other items with other people in your home. After using these items, you should wash them thoroughly with soap and water.  Cover your coughs and sneezes Cover your mouth and nose with a tissue when you cough or sneeze, or you can cough or sneeze into your sleeve. Throw used tissues in a lined trash can, and immediately wash your hands with soap and water for at least 20 seconds or use an alcohol-based hand rub.  Wash your [facility] your hands often and thoroughly with soap and water for at least 20 seconds. You can use an alcohol-based hand sanitizer if soap and water are not available and if your hands are not visibly dirty. Avoid touching your eyes, nose, and mouth with unwashed hands.   Prevention Steps for Caregivers and Household Members of Individuals Confirmed to have, or Being Evaluated for, COVID-19 Infection Being Cared for in the Home  If you live with, or provide care at home for, a person confirmed to have, or being evaluated for, COVID-19 infection please follow these guidelines to prevent infection:  Follow healthcare providers instructions Make sure that you understand and  can help the patient follow any healthcare provider instructions for all care.  Provide for the patients basic needs You should help the patient with basic needs in the home and provide support for getting groceries, prescriptions, and other personal needs.  Monitor the patients symptoms If they are getting sicker, call his or her medical  provider and tell them that the patient has, or is being evaluated for, COVID-19 infection. This will help the healthcare providers office take steps to keep other people from getting infected. Ask the healthcare provider to call the local or state health department.  Limit the number of people who have contact with the patient If possible, have only one caregiver for the patient. Other household members should stay in another home or place of residence. If this is not possible, they should stay in another room, or be separated from the patient as much as possible. Use a separate bathroom, if available. Restrict visitors who do not have an essential need to be in the home.  Keep older adults, very young children, and other sick people away from the patient Keep older adults, very young children, and those who have compromised immune systems or chronic health conditions away from the patient. This includes people with chronic heart, lung, or kidney conditions, diabetes, and cancer.  Ensure good ventilation Make sure that shared spaces in the home have good air flow, such as from an air conditioner or an opened window, weather permitting.  Wash your hands often Wash your hands often and thoroughly with soap and water for at least 20 seconds. You can use an alcohol based hand sanitizer if soap and water are not available and if your hands are not visibly dirty. Avoid touching your eyes, nose, and mouth with unwashed hands. Use disposable paper towels to dry your hands. If not available, use dedicated cloth towels and replace them when they become wet.  Wear a facemask and gloves Wear a disposable facemask at all times in the room and gloves when you touch or have contact with the patients blood, body fluids, and/or secretions or excretions, such as sweat, saliva, sputum, nasal mucus, vomit, urine, or feces.  Ensure the mask fits over your nose and mouth tightly, and do not touch it during  use. Throw out disposable facemasks and gloves after using them. Do not reuse. Wash your hands immediately after removing your facemask and gloves. If your personal clothing becomes contaminated, carefully remove clothing and launder. Wash your hands after handling contaminated clothing. Place all used disposable facemasks, gloves, and other waste in a lined container before disposing them with other household waste. Remove gloves and wash your hands immediately after handling these items.  Do not share dishes, glasses, or other household items with the patient Avoid sharing household items. You should not share dishes, drinking glasses, cups, eating utensils, towels, bedding, or other items with a patient who is confirmed to have, or being evaluated for, COVID-19 infection. After the person uses these items, you should wash them thoroughly with soap and water.  Wash laundry thoroughly Immediately remove and wash clothes or bedding that have blood, body fluids, and/or secretions or excretions, such as sweat, saliva, sputum, nasal mucus, vomit, urine, or feces, on them. Wear gloves when handling laundry from the patient. Read and follow directions on labels of laundry or clothing items and detergent. In general, wash and dry with the warmest temperatures recommended on the label.  Clean all areas the individual has used often Clean all  touchable surfaces, such as counters, tabletops, doorknobs, bathroom fixtures, toilets, phones, keyboards, tablets, and bedside tables, every day. Also, clean any surfaces that may have blood, body fluids, and/or secretions or excretions on them. Wear gloves when cleaning surfaces the patient has come in contact with. Use a diluted bleach solution (e.g., dilute bleach with 1 part bleach and 10 parts water) or a household disinfectant with a label that says EPA-registered for coronaviruses. To make a bleach solution at home, add 1 tablespoon of bleach to 1 quart (4  cups) of water. For a larger supply, add  cup of bleach to 1 gallon (16 cups) of water. Read labels of cleaning products and follow recommendations provided on product labels. Labels contain instructions for safe and effective use of the cleaning product including precautions you should take when applying the product, such as wearing gloves or eye protection and making sure you have good ventilation during use of the product. Remove gloves and wash hands immediately after cleaning.  Monitor yourself for signs and symptoms of illness Caregivers and household members are considered close contacts, should monitor their health, and will be asked to limit movement outside of the home to the extent possible. Follow the monitoring steps for close contacts listed on the symptom monitoring form.   ? If you have additional questions, contact your local health department or call the epidemiologist on call at [phone] (available 24/7). ? This guidance is subject to change. For the most up-to-date guidance from [facility], please refer to their website: YouBlogs.pl

## 2018-12-10 NOTE — Telephone Encounter (Signed)
Call from pt - c/o shortness of breath. Stated she has not taken any medication in a week. Stated she did go to [place] from March 7 to March 24 and she self quarantined herself at home x 14 days. Stated she will check her temperature when she gets off the phone.  NURSING TRIAGE NOTE FOR RESPIRATORY SYMPTOMS  Do you have a fever? Yes stated last time taken on Saturday and it was "99 point something".  Do you have a cough? Yes - productive , thick sometimes.  Do you have shortness of breath more than normal? Yes - severe OR while sitting Do you have chest pain?Yes  Also back and legs. Are you able to eat and drink normally? Yes Have you seen a physician for these symptoms? No  Action information sent to physician to conduct a phone appoitment  I informed patient to expect a phone call from a physician soon and I sent request to front desk to schedule a virtual office appt for patient and arrive the patient.

## 2018-12-10 NOTE — ED Notes (Signed)
CT made aware IV was good, had good return, no infiltration, pt denies pain or any deformities. Writer assessed area with Rolla Plate, Therapist, sports. Dr. Darl Householder made aware.

## 2018-12-10 NOTE — ED Notes (Signed)
Pt back from CT

## 2018-12-10 NOTE — Telephone Encounter (Deleted)
NURSING TRIAGE NOTE FOR RESPIRATORY SYMPTOMS  Do you have a fever? {NAMES:3044014::"No","Yes - subjective","Yes - objective at ***"}  Do you have a cough? Yes - productive, sometimes thick. Do you have shortness of breath more than normal? {NAMES:3044014::"No","Yes - severe OR while sitting","Yes - moderate","Yes - mild OR with significant movement"} Do you have chest pain?{NAMES:3044014::"No","Yes"} Are you able to eat and drink normally? {NAMES:3044014::"No","Yes but not as much","Yes"} Have you seen a physician for these symptoms? {NAMES:3044014::"No","Yes - ***","N/A"}  Action {NAMES:3044014::"information sent to physician to conduct a phone appoitment","Patient instructed to call 911","***"}  I informed patient to expect a phone call from a physician soon and I sent request to front desk to schedule a virtual office appt for patient and arrive the patient.

## 2018-12-10 NOTE — ED Triage Notes (Signed)
Pt complains of cough, shortness of breath, fever, body aches. Pt states she came back from [place] and has been quarantined for 2 weeks.

## 2018-12-10 NOTE — Telephone Encounter (Signed)
Talked to Dr Tarri Abernethy - after reviewing pt's chart, he suggested pt go to [facility]  ED.  I called pt back,instructed to go to [facility] ED to be evaluated. Pt verbalized understanding.

## 2018-12-10 NOTE — ED Notes (Signed)
CT tech stated that she did not feel comfortable to perform CT scan with current IV. Dr. Darl Householder made aware.

## 2018-12-13 ENCOUNTER — Telehealth: Payer: Self-pay | Admitting: Internal Medicine

## 2018-12-13 NOTE — Telephone Encounter (Signed)
Needs

## 2018-12-13 NOTE — Telephone Encounter (Signed)
Returned call to patient. No answer. VMailbox is full, unable to leave message. Hubbard Hartshorn, RN, BSN

## 2018-12-13 NOTE — Telephone Encounter (Signed)
Pt has question regarding medications that was prescribe at [facility], [phone]

## 2018-12-16 ENCOUNTER — Encounter: Payer: Self-pay | Admitting: Internal Medicine

## 2018-12-17 NOTE — Telephone Encounter (Signed)
Called pt again - mailbox is full, unable to leave a message. 

## 2019-01-11 ENCOUNTER — Other Ambulatory Visit: Payer: Self-pay | Admitting: Neurology

## 2019-01-13 ENCOUNTER — Encounter: Payer: Self-pay | Admitting: Podiatry

## 2019-01-13 ENCOUNTER — Ambulatory Visit (INDEPENDENT_AMBULATORY_CARE_PROVIDER_SITE_OTHER): Payer: Medicare HMO | Admitting: Podiatry

## 2019-01-13 ENCOUNTER — Other Ambulatory Visit: Payer: Self-pay

## 2019-01-13 VITALS — Temp 97.3°F

## 2019-01-13 DIAGNOSIS — M7752 Other enthesopathy of left foot: Secondary | ICD-10-CM

## 2019-01-13 DIAGNOSIS — B351 Tinea unguium: Secondary | ICD-10-CM | POA: Diagnosis not present

## 2019-01-13 DIAGNOSIS — M79674 Pain in right toe(s): Secondary | ICD-10-CM

## 2019-01-13 DIAGNOSIS — Q828 Other specified congenital malformations of skin: Secondary | ICD-10-CM | POA: Diagnosis not present

## 2019-01-13 DIAGNOSIS — M79675 Pain in left toe(s): Secondary | ICD-10-CM

## 2019-01-13 MED ORDER — TRIAMCINOLONE ACETONIDE 10 MG/ML IJ SUSP
10.0000 mg | Freq: Once | INTRAMUSCULAR | Status: AC
  Administered 2019-01-13: 09:00:00 10 mg

## 2019-01-14 ENCOUNTER — Telehealth: Payer: Self-pay | Admitting: Cardiology

## 2019-01-14 NOTE — Progress Notes (Signed)
Subjective: 68 y.o. returns the office today for painful, elongated, thickened toenails which she cannot trim herself and for calluses to the left foot. Denies any redness or drainage around the nails.  She also states that she still getting pain to the left.  Pain is intermittent denies any recent injury or falls or changes since I last saw her.  Denies any acute changes since last appointment and no new complaints today. Denies any systemic complaints such as fevers, chills, nausea, vomiting.   PCP: Lucious Groves, DO  Objective: AAO 3, NAD DP/PT pulses palpable, CRT less than 3 seconds Nails hypertrophic, dystrophic, elongated, brittle, discolored 10. There is tenderness overlying the nails 1-5 bilaterally. There is no surrounding erythema or drainage along the nail sites. Hyperkeratotic tissue present left fifth metatarsal head, left fifth metatarsal base.  No ongoing ulceration drainage or any signs of infection. Tenderness to the anterior lateral aspect of the left ankle joint.  There is no pain or crepitation with range of motion.  She is had a previous subtalar joint fusion.  No area pinpoint tenderness. No open lesions or pre-ulcerative lesions are identified. No other areas of tenderness bilateral lower extremities. No overlying edema, erythema, increased warmth. No pain with calf compression, swelling, warmth, erythema.  Assessment: Patient presents with symptomatic onychomycosis, hyperkeratotic lesions, ankle joint capsulitis  Plan: -Treatment options including alternatives, risks, complications were discussed -Nails sharply debrided 10 without complication/bleeding. -Hyperkeratotic lesion sharply debrided x2 without any complications or bleeding -Steroid injection for left ankle performed.  See procedure note below. -Discussed daily foot inspection. If there are any changes, to call the office immediately.  -Follow-up in 3 months or sooner if any problems are to arise. In the  meantime, encouraged to call the office with any questions, concerns, changes symptoms.  Procedure: Injection Intermediate joint  Discussed alternatives, risks, complications and verbal consent was obtained.  Location: Left ankle  Skin Prep: Betadine Injectate: 0.5cc 0.5% marcaine plain, 0.5 cc 2% lidocaine plain and, 1 cc kenalog 10. Disposition: Patient tolerated procedure well. Injection site dressed with a band-aid.  Post-injection care was discussed and return precautions discussed.   Celesta Gentile, DPM

## 2019-01-14 NOTE — Telephone Encounter (Signed)
Left message to call and confirm appointment  and set up verbal consent

## 2019-01-15 ENCOUNTER — Telehealth: Payer: Self-pay | Admitting: Cardiology

## 2019-01-15 NOTE — Telephone Encounter (Signed)
Virtual Visit Pre-Appointment Phone Call  "(Name), I am calling you today to discuss your upcoming appointment. We are currently trying to limit exposure to the virus that causes COVID-19 by seeing patients at home rather than in the office."  1. "What is the BEST phone number to call the day of the visit?" - include this in appointment notes  2. Do you have or have access to (through a family member/friend) a smartphone with video capability that we can use for your visit?" a. If yes - list this number in appt notes as cell (if different from BEST phone #) and list the appointment type as a VIDEO visit in appointment notes b. If no - list the appointment type as a PHONE visit in appointment notes  3. Confirm consent - "In the setting of the current Covid19 crisis, you are scheduled for a (phone or video) visit with your provider on (date) at (time).  Just as we do with many in-office visits, in order for you to participate in this visit, we must obtain consent.  If you'd like, I can send this to your mychart (if signed up) or email for you to review.  Otherwise, I can obtain your verbal consent now.  All virtual visits are billed to your insurance company just like a normal visit would be.  By agreeing to a virtual visit, we'd like you to understand that the technology does not allow for your provider to perform an examination, and thus may limit your provider's ability to fully assess your condition. If your provider identifies any concerns that need to be evaluated in person, we will make arrangements to do so.  Finally, though the technology is pretty good, we cannot assure that it will always work on either your or our end, and in the setting of a video visit, we may have to convert it to a phone-only visit.  In either situation, we cannot ensure that we have a secure connection.  Are you willing to proceed?" STAFF: Did the patient verbally acknowledge consent to telehealth visit? Document  YES/NO here: yes/Video/[email or web address].[email or web address]/smartphone [phone]/ verbal consent 01/15/19  4. Advise patient to be prepared - "Two hours prior to your appointment, go ahead and check your blood pressure, pulse, oxygen saturation, and your weight (if you have the equipment to check those) and write them all down. When your visit starts, your provider will ask you for this information. If you have an Apple Watch or Kardia device, please plan to have heart rate information ready on the day of your appointment. Please have a pen and paper handy nearby the day of the visit as well."  5. Give patient instructions for MyChart download to smartphone OR Doximity/Doxy.me as below if video visit (depending on what platform provider is using)  6. Inform patient they will receive a phone call 15 minutes prior to their appointment time (may be from unknown caller ID) so they should be prepared to answer    TELEPHONE CALL NOTE  Arlenne Kimbley has been deemed a candidate for a follow-up tele-health visit to limit community exposure during the Covid-19 pandemic. I spoke with the patient via phone to ensure availability of phone/video source, confirm preferred email & phone number, and discuss instructions and expectations.  I reminded Josepha Barbier to be prepared with any vital sign and/or heart rhythm information that could potentially be obtained via home monitoring, at the time of her visit. I reminded Amaani Guilbault to expect a phone call  prior to her visit.  Armando Gang 01/15/2019 9:40 AM   INSTRUCTIONS FOR DOWNLOADING THE MYCHART APP TO SMARTPHONE  - The patient must first make sure to have activated MyChart and know their login information - If Apple, go to [employer] and type in MyChart in the search bar and download the app. If Android, ask patient to go to [employer] and type in [place] in the search bar and download the app. The app is free but as with any other app downloads, their phone  may require them to verify saved payment information or Apple/Android password.  - The patient will need to then log into the app with their MyChart username and password, and select [facility] as their healthcare provider to link the account. When it is time for your visit, go to the MyChart app, find appointments, and click Begin Video Visit. Be sure to Select Allow for your device to access the Microphone and Camera for your visit. You will then be connected, and your provider will be with you shortly.  **If they have any issues connecting, or need assistance please contact MyChart service desk [phone])**  **If using a computer, in order to ensure the best quality for their visit they will need to use either of the following Internet Browsers: [employer], or Google Chrome**  IF USING DOXIMITY or DOXY.ME - The patient will receive a link just prior to their visit by text.     FULL LENGTH CONSENT FOR TELE-HEALTH VISIT   I hereby voluntarily request, consent and authorize [facility] and its employed or contracted physicians, physician assistants, nurse practitioners or other licensed health care professionals (the Practitioner), to provide me with telemedicine health care services (the Services") as deemed necessary by the treating Practitioner. I acknowledge and consent to receive the Services by the Practitioner via telemedicine. I understand that the telemedicine visit will involve communicating with the Practitioner through live audiovisual communication technology and the disclosure of certain medical information by electronic transmission. I acknowledge that I have been given the opportunity to request an in-person assessment or other available alternative prior to the telemedicine visit and am voluntarily participating in the telemedicine visit.  I understand that I have the right to withhold or withdraw my consent to the use of telemedicine in the course of my  care at any time, without affecting my right to future care or treatment, and that the Practitioner or I may terminate the telemedicine visit at any time. I understand that I have the right to inspect all information obtained and/or recorded in the course of the telemedicine visit and may receive copies of available information for a reasonable fee.  I understand that some of the potential risks of receiving the Services via telemedicine include:   Delay or interruption in medical evaluation due to technological equipment failure or disruption;  Information transmitted may not be sufficient (e.g. poor resolution of images) to allow for appropriate medical decision making by the Practitioner; and/or   In rare instances, security protocols could fail, causing a breach of personal health information.  Furthermore, I acknowledge that it is my responsibility to provide information about my medical history, conditions and care that is complete and accurate to the best of my ability. I acknowledge that Practitioner's advice, recommendations, and/or decision may be based on factors not within their control, such as incomplete or inaccurate data provided by me or distortions of diagnostic images or specimens that may result from electronic  transmissions. I understand that the practice of medicine is not an exact science and that Practitioner makes no warranties or guarantees regarding treatment outcomes. I acknowledge that I will receive a copy of this consent concurrently upon execution via email to the email address I last provided but may also request a printed copy by calling the office of [facility].    I understand that my insurance will be billed for this visit.   I have read or had this consent read to me.  I understand the contents of this consent, which adequately explains the benefits and risks of the Services being provided via telemedicine.   I have been provided ample opportunity to ask  questions regarding this consent and the Services and have had my questions answered to my satisfaction.  I give my informed consent for the services to be provided through the use of telemedicine in my medical care  By participating in this telemedicine visit I agree to the above.

## 2019-01-16 NOTE — Progress Notes (Signed)
This encounter was created in error - please disregard.

## 2019-01-17 ENCOUNTER — Encounter: Payer: Medicare HMO | Admitting: Cardiology

## 2019-01-17 ENCOUNTER — Other Ambulatory Visit: Payer: Self-pay

## 2019-01-28 ENCOUNTER — Ambulatory Visit: Payer: Medicare Other | Admitting: Internal Medicine

## 2019-02-22 ENCOUNTER — Encounter

## 2019-03-24 NOTE — Progress Notes (Deleted)
NEUROLOGY FOLLOW UP OFFICE NOTE  Alisha [name] [id number]  HISTORY OF PRESENT ILLNESS: Alisha [name] is a 68 year old right-handed female with pituitary adenoma, asthma, CAD, OSA, fibromyalgia, hypertension and depression who follows up for migraine and pituitary adenoma.  UPDATE: Intensity:  *** Duration:  *** Frequency:  *** Current NSAIDS: was prescribed flurbiprofen 50 mg but never picked it up. Current analgesics: Percocet (rarely for chronic pain) Current triptans: None Current ergotamine: None Current anti-emetic: None Current muscle relaxants: Tizanidine 6 mg at bedtime (for spasms in the temple) Current anti-anxiolytic: None Current sleep aide: None Current Antihypertensive medications: Toprol, Lasix, losartan Current Antidepressant medications: Cymbalta 60 mg Current Anticonvulsant medications: Topiramate 50 mg at bedtime Current anti-CGRP: None Current Vitamins/Herbal/Supplements: None Current Antihistamines/Decongestants: None Other therapy: None  Depression: No; Anxiety: No Other pain: Diffuse pain Sleep hygiene:  Recent sleep study identified OSA.    Ophthalmology?  HISTORY: In the mid-1990s, she began experiencing headaches. She reported difficulty with seeing the eye chart on physical exams. In 1999, she was found to have peripheral vision loss by an eye doctor. She had an MRI of the brain which revealed a pituitary mass that was compressing the optic nerve. Biopsy confirmed it to be benign. She underwent 3 surgeries, including transphenoidal surgery. She underwent radiation in 2014. 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 She would periodically have repeat MRI of brain performed and regular visual field testing. MRI of brain and pituitary with and without contrast from 11/13/16 revealed chronic post-surgical right frontal and temporal encephalomalcia and previous resection of pituitary mass without residual or recurrent  tumor. MRI of brain with and without contrast from 04/25/18 again demonstrated multiple post-surgical changes for pituitary adenoma and residual tumor in left cavernous sinus stable compared to prior imaging from March 2018.  She has migraine headaches. They are "15"/10 intensity, pounding right-sided or bi-temporal and occipital headache, associated with nausea, photophobia and phonophobia. They last 1 to 2 days and occur about 4 or 5 times a year. Stress is a trigger. Maxalt relieves it. If Maxalt doesn't work, she goes to the ED. She also has dull daily headaches as well.  She also has history of vertigo and falls.  She has fibromyalgia and depression, and takes multiple medications, including chronic opioid use.  Past medication: Amitriptyline (side effects), sertraline 25mg , amlodipine, rizatriptan 10mg  (ineffective and aggravating), Tylenol, naproxen, ibuprofen  To further evaluate right temporal headache and right sided vision loss, sed rate from August 2018 was 24. She was referred to ophthalmology for visual field testing. She was evaluated by Dr. Kathlen Mody at [facility] in September 2018. 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 She did not follow up with me as scheduled.  PAST MEDICAL HISTORY: Past Medical History:  Diagnosis Date   Arthritis    Asthma    Bipolar disorder ([place])    CAD (coronary artery disease) 07/07/2016   a. s/p stenting x 2 in [place] to the LAD, RCA is chronically occluded, LVEDP was 34   Cancer ([place])    cervical   Depression    Fibromyalgia    GERD (gastroesophageal reflux disease)    History of benign pituitary tumor    History of kidney stones    1972   Hyperlipidemia    Hypertension    Migraine    Morbid obesity ([place])    Peripheral vascular disease ([place])  Pneumonia    Seizures ([place])      during surgery for pituitary tumor   Sleep apnea    with cpap   Vertigo     MEDICATIONS: Current Outpatient Medications on File Prior to Visit  Medication Sig Dispense Refill   albuterol (PROVENTIL) (2.5 MG/3ML) 0.083% nebulizer solution Take 3 mLs (2.5 mg total) by nebulization every 6 (six) hours as needed for wheezing or shortness of breath. 150 mL 1   albuterol (VENTOLIN HFA) 108 (90 Base) MCG/ACT inhaler Inhale 1 puff into the lungs every 6 (six) hours as needed for wheezing or shortness of breath.     budesonide-formoterol (SYMBICORT) 80-4.5 MCG/ACT inhaler Inhale 2 puffs into the lungs 2 (two) times daily. 1 Inhaler 11   chlorpheniramine (CHLOR-TRIMETON) 4 MG tablet Take 4 mg by mouth 2 (two) times daily as needed for allergies.     clopidogrel (PLAVIX) 75 MG tablet Take 1 tablet (75 mg total) by mouth daily. 30 tablet 11   cyclobenzaprine (FLEXERIL) 5 MG tablet Take 1 tablet (5 mg total) by mouth 3 (three) times daily as needed for muscle spasms. 10 tablet 0   dextromethorphan (DELSYM) 30 MG/5ML liquid Take 15 mg by mouth 2 (two) times daily as needed for cough.     diclofenac (CATAFLAM) 50 MG tablet Take 50 mg by mouth 3 (three) times daily.     diclofenac sodium (VOLTAREN) 1 % GEL Apply 4 g topically 4 (four) times daily.     DULoxetine (CYMBALTA) 60 MG capsule Take 1 capsule (60 mg total) by mouth daily. (Patient taking differently: Take 60 mg by mouth daily as needed (anxiety). ) 90 capsule 3   esomeprazole (NEXIUM) 40 MG capsule Take 40 mg by mouth 2 (two) times daily before a meal.     fexofenadine (ALLEGRA) 180 MG tablet Take 180 mg by mouth daily.     flurbiprofen (ANSAID) 50 MG tablet Take 1 tablet every 8 hours as needed, maximum 3 tablets/24 hours (Patient taking differently: Take 50 mg by mouth 3 (three) times daily as needed (pain/arthritis). Take 1 tablet every 8 hours as needed, maximum 3 tablets/24 hours) 30 tablet 3   furosemide (LASIX) 20 MG tablet  Take 2 tablets (40 mg total) by mouth daily. 180 tablet 3   gabapentin (NEURONTIN) 100 MG capsule TAKE 1 CAPSULE BY MOUTH 3 TIMES DAILY (Patient taking differently: Take 100 mg by mouth 3 (three) times daily. ) 270 capsule 1   isosorbide dinitrate (ISORDIL) 30 MG tablet Take 30 mg by mouth daily.     loratadine (CLARITIN) 10 MG tablet Take 10 mg by mouth daily.  3   losartan (COZAAR) 100 MG tablet Take 1 tablet (100 mg total) daily by mouth. 90 tablet 0   metFORMIN (GLUCOPHAGE) 500 MG tablet Take 500 mg by mouth 2 (two) times daily.     mirabegron ER (MYRBETRIQ) 50 MG TB24 tablet Take 50 mg by mouth daily.     montelukast (SINGULAIR) 10 MG tablet Take 10 mg by mouth daily.     morphine (MS CONTIN) 15 MG 12 hr tablet Take 1 tablet (15 mg total) by mouth daily as needed for pain. (Patient not taking: Reported on 12/10/2018) 15 tablet 0   oxybutynin (DITROPAN XL) 15 MG 24 hr tablet Take 15 mg by mouth daily.     predniSONE (DELTASONE) 20 MG tablet Take 60 mg daily x 2 days then 40 mg daily x 2 days then 20 mg daily x 2 days  12 tablet 0   Propylene Glycol (SYSTANE BALANCE) 0.6 % SOLN Apply 1 drop to eye as needed (dry eyes).     rosuvastatin (CRESTOR) 40 MG tablet Take 40 mg by mouth daily.     tiZANidine (ZANAFLEX) 4 MG tablet TAKE 1&1/2 TABLETS DAILY 135 tablet 1   No current facility-administered medications on file prior to visit.     ALLERGIES: Allergies  Allergen Reactions   Ibuprofen     Other reaction(s): Upset Stomach   Ampicillin Nausea Only   Asa [Aspirin] Diarrhea    Other reaction(s): Upset Stomach   Darvon [Propoxyphene] Nausea And Vomiting   Demeclocycline Other (See Comments)    Nerves feeling   Eggs Or Egg-Derived Products Diarrhea   Lactalbumin Diarrhea   Lisinopril Cough   Milk-Related Compounds Diarrhea   Salicylates Other (See Comments)    Unknown Unknown    Tetracycline Hcl Hives and Nausea Only   Tetracyclines & Related Other (See  Comments)    Nerves feeling     FAMILY HISTORY: Family History  Problem Relation Age of Onset   Heart disease Mother 82       CABG, pacemaker, valve   Cancer Father    Heart disease Sister        Fluid around the heart   Breast cancer Neg Hx     SOCIAL HISTORY: Social History   Socioeconomic History   Marital status: Divorced    Spouse name: Not on file   Number of children: Not on file   Years of education: Not on file   Highest education level: Not on file  Occupational History   Not on file  Social Needs   Financial resource strain: Not on file   Food insecurity    Worry: Not on file    Inability: Not on file   Transportation needs    Medical: Not on file    Non-medical: Not on file  Tobacco Use   Smoking status: Never Smoker   Smokeless tobacco: Never Used  Substance and Sexual Activity   Alcohol use: Yes    Comment: Socially. [name].   Drug use: No   Sexual activity: Not Currently  Lifestyle   Physical activity    Days per week: Not on file    Minutes per session: Not on file   Stress: Not on file  Relationships   Social connections    Talks on phone: Not on file    Gets together: Not on file    Attends religious service: Not on file    Active member of club or organization: Not on file    Attends meetings of clubs or organizations: Not on file    Relationship status: Not on file   Intimate partner violence    Fear of current or ex partner: Not on file    Emotionally abused: Not on file    Physically abused: Not on file    Forced sexual activity: Not on file  Other Topics Concern   Not on file  Social History Narrative   Current Social History        Who lives at home: Lives with youngest daughter, "Olivia Mackie" 09/25/2016    Transportation: Public 5/57/3220   Important Relationships & Pets: 2 daughters (52, 42), 1 son (54), 10 grandchildren, 4 great grandchildren 09/25/2016    Current Stressors: Recently moved from  [place], [place] due to threat of NH placement 09/25/2016   Religious / Personal Beliefs: Darrick Meigs 09/25/2016   Interests /  Fun: Dancing 09/25/2016   Other: Participates in [employer] 09/25/2016    REVIEW OF SYSTEMS: Constitutional: No fevers, chills, or sweats, no generalized fatigue, change in appetite Eyes: No visual changes, double vision, eye pain Ear, nose and throat: No hearing loss, ear pain, nasal congestion, sore throat Cardiovascular: No chest pain, palpitations Respiratory:  No shortness of breath at rest or with exertion, wheezes GastrointestinaI: No nausea, vomiting, diarrhea, abdominal pain, fecal incontinence Genitourinary:  No dysuria, urinary retention or frequency Musculoskeletal:  No neck pain, back pain Integumentary: No rash, pruritus, skin lesions Neurological: as above Psychiatric: No depression, insomnia, anxiety Endocrine: No palpitations, fatigue, diaphoresis, mood swings, change in appetite, change in weight, increased thirst Hematologic/Lymphatic:  No purpura, petechiae. Allergic/Immunologic: no itchy/runny eyes, nasal congestion, recent allergic reactions, rashes  PHYSICAL EXAM: *** General: No acute distress.  Patient appears ***-groomed.   Head:  Normocephalic/atraumatic Eyes:  Fundi examined but not visualized Neck: supple, no paraspinal tenderness, full range of motion Heart:  Regular rate and rhythm Lungs:  Clear to auscultation bilaterally Back: No paraspinal tenderness Neurological Exam: alert and oriented to person, place, and time. Attention span and concentration intact, recent and remote memory intact, fund of knowledge intact.  Speech fluent and not dysarthric, language intact.  CN II-XII intact. Bulk and tone normal, muscle strength 5/5 throughout.  Sensation to light touch, temperature and vibration intact.  Deep tendon reflexes 2+ throughout, toes downgoing.  Finger to nose and heel to shin testing intact.  Gait normal, Romberg  negative.  IMPRESSION: 1.  Migraine without aura, without status migrainosus, not intractable 2.  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  Morbid obesity (BMI ***) 4.  OSA 5.  HTN  PLAN: 1.  For preventative management, topiramate 50mg  at bedtime 2.  For abortive therapy, flurbiprofen 3. Repeat MRI of brain with and without contrast with attention to left cavernous sinus in September. 4.  Limit use of pain relievers to no more than 2 days out of week to prevent risk of rebound or medication-overuse headache. 5.  Keep headache diary 6.  Exercise, hydration, caffeine cessation, sleep hygiene, monitor for and avoid triggers 7.  Consider:  magnesium citrate 400mg  daily, riboflavin 400mg  daily, and coenzyme Q10 100mg  three times daily 8. Always keep in mind that currently taking a hormone or birth control may be a possible trigger or aggravating factor for migraine. 9. Follow up ***   Metta Clines, DO  CC: ***

## 2019-03-25 ENCOUNTER — Other Ambulatory Visit: Payer: Self-pay | Admitting: Family

## 2019-03-25 ENCOUNTER — Ambulatory Visit: Payer: Medicare Other | Admitting: Neurology

## 2019-03-25 DIAGNOSIS — Z1231 Encounter for screening mammogram for malignant neoplasm of breast: Secondary | ICD-10-CM

## 2019-04-15 ENCOUNTER — Encounter: Payer: Self-pay | Admitting: Podiatry

## 2019-04-15 ENCOUNTER — Other Ambulatory Visit: Payer: Self-pay

## 2019-04-15 ENCOUNTER — Ambulatory Visit (INDEPENDENT_AMBULATORY_CARE_PROVIDER_SITE_OTHER): Payer: Medicare Other | Admitting: Podiatry

## 2019-04-15 VITALS — Temp 98.2°F

## 2019-04-15 DIAGNOSIS — Q828 Other specified congenital malformations of skin: Secondary | ICD-10-CM | POA: Diagnosis not present

## 2019-04-15 DIAGNOSIS — Z7901 Long term (current) use of anticoagulants: Secondary | ICD-10-CM

## 2019-04-15 DIAGNOSIS — M79674 Pain in right toe(s): Secondary | ICD-10-CM

## 2019-04-15 DIAGNOSIS — M79675 Pain in left toe(s): Secondary | ICD-10-CM | POA: Diagnosis not present

## 2019-04-15 DIAGNOSIS — B351 Tinea unguium: Secondary | ICD-10-CM

## 2019-04-15 NOTE — Progress Notes (Signed)
Subjective: 68 y.o. returns the office today for painful, elongated, thickened toenails which she cannot trim herself as well as for calluses. Denies any redness or drainage around the nails/calluses.  She has been doing more walking since last appointment.  The injection did help her left ankle quite a bit.  Denies any acute changes since last appointment and no new complaints today. Denies any systemic complaints such as fevers, chills, nausea, vomiting.   PCP: Lucious Groves, DO A1c: She believes is around 7  She is on plavix  Objective: AAO 3, NAD DP/PT pulses palpable, CRT less than 3 seconds-chronic edema present bilateral Nails hypertrophic, dystrophic, elongated, brittle, discolored 10. There is tenderness overlying the nails 1-5 bilaterally. There is no surrounding erythema or drainage along the nail sites. Hyperkeratotic lesion left fifth metatarsal head, left metatarsal base, right fifth digit.  No ongoing ulceration drainage or signs of infection. No other open lesions or pre-ulcerative lesions are identified. No other areas of tenderness bilateral lower extremities. No overlying edema, erythema, increased warmth.  No pain with calf compression, swelling, warmth, erythema.  Assessment: Patient presents with symptomatic onychomycosis; hyperkeratotic lesions  Plan: -Treatment options including alternatives, risks, complications were discussed -Nails sharply debrided 10 without complication/bleeding. -Hyperkeratotic lesion sharply debrided x3 without complications or bleeding -Will hold off on another injection today.  -Discussed daily foot inspection. If there are any changes, to call the office immediately.  -Follow-up in 3 months or sooner if any problems are to arise. In the meantime, encouraged to call the office with any questions, concerns, changes symptoms.  Celesta Gentile, DPM

## 2019-04-16 ENCOUNTER — Other Ambulatory Visit: Payer: Self-pay | Admitting: Neurology

## 2019-05-09 ENCOUNTER — Other Ambulatory Visit: Payer: Self-pay

## 2019-05-09 ENCOUNTER — Ambulatory Visit
Admission: RE | Admit: 2019-05-09 | Discharge: 2019-05-09 | Disposition: A | Discharge status: Home / Self Care | Payer: Medicare Other | Source: Ambulatory Visit | Attending: Family | Admitting: Family

## 2019-05-09 DIAGNOSIS — Z1231 Encounter for screening mammogram for malignant neoplasm of breast: Secondary | ICD-10-CM

## 2019-06-11 ENCOUNTER — Emergency Department (HOSPITAL_COMMUNITY): Payer: Medicare Other

## 2019-06-11 ENCOUNTER — Other Ambulatory Visit: Payer: Self-pay

## 2019-06-11 ENCOUNTER — Emergency Department (HOSPITAL_COMMUNITY)
Admission: EM | Admit: 2019-06-11 | Discharge: 2019-06-11 | Disposition: A | Discharge status: Home / Self Care | Payer: Medicare Other | Attending: Emergency Medicine | Admitting: Emergency Medicine

## 2019-06-11 ENCOUNTER — Encounter (HOSPITAL_COMMUNITY): Payer: Self-pay | Admitting: Emergency Medicine

## 2019-06-11 DIAGNOSIS — Z79899 Other long term (current) drug therapy: Secondary | ICD-10-CM | POA: Diagnosis not present

## 2019-06-11 DIAGNOSIS — R0602 Shortness of breath: Secondary | ICD-10-CM | POA: Insufficient documentation

## 2019-06-11 DIAGNOSIS — J45909 Unspecified asthma, uncomplicated: Secondary | ICD-10-CM | POA: Diagnosis not present

## 2019-06-11 DIAGNOSIS — R0789 Other chest pain: Secondary | ICD-10-CM

## 2019-06-11 DIAGNOSIS — I2510 Atherosclerotic heart disease of native coronary artery without angina pectoris: Secondary | ICD-10-CM | POA: Diagnosis not present

## 2019-06-11 DIAGNOSIS — I10 Essential (primary) hypertension: Secondary | ICD-10-CM | POA: Diagnosis not present

## 2019-06-11 LAB — CBC
HCT: 42 % (ref 36.0–46.0)
Hemoglobin: 14.1 g/dL (ref 12.0–15.0)
MCH: 29.7 pg (ref 26.0–34.0)
MCHC: 33.6 g/dL (ref 30.0–36.0)
MCV: 88.6 fL (ref 80.0–100.0)
Platelets: 233 10*3/uL (ref 150–400)
RBC: 4.74 MIL/uL (ref 3.87–5.11)
RDW: 13.1 % (ref 11.5–15.5)
WBC: 6.7 10*3/uL (ref 4.0–10.5)
nRBC: 0 % (ref 0.0–0.2)

## 2019-06-11 LAB — BASIC METABOLIC PANEL
Anion gap: 15 (ref 5–15)
BUN: 9 mg/dL (ref 8–23)
CO2: 18 mmol/L — ABNORMAL LOW (ref 22–32)
Calcium: 9.2 mg/dL (ref 8.9–10.3)
Chloride: 109 mmol/L (ref 98–111)
Creatinine, Ser: 0.65 mg/dL (ref 0.44–1.00)
GFR calc Af Amer: >60 mL/min (ref >60)
GFR calc non Af Amer: >60 mL/min (ref >60)
Glucose, Bld: 121 mg/dL — ABNORMAL HIGH (ref 70–99)
Potassium: 3.7 mmol/L (ref 3.5–5.1)
Sodium: 142 mmol/L (ref 135–145)

## 2019-06-11 LAB — TROPONIN I (HIGH SENSITIVITY)
Troponin I (High Sensitivity): 7 ng/L (ref <18)
Troponin I (High Sensitivity): 6 ng/L (ref <18)

## 2019-06-11 LAB — BRAIN NATRIURETIC PEPTIDE: B Natriuretic Peptide: 40.3 pg/mL (ref 0.0–100.0)

## 2019-06-11 LAB — D-DIMER, QUANTITATIVE: D-Dimer, Quant: 1.01 ug/mL-FEU — ABNORMAL HIGH (ref 0.00–0.50)

## 2019-06-11 MED ORDER — FUROSEMIDE 10 MG/ML IJ SOLN
40.0000 mg | Freq: Once | INTRAMUSCULAR | Status: AC
  Administered 2019-06-11: 40 mg via INTRAVENOUS
  Filled 2019-06-11: qty 4

## 2019-06-11 MED ORDER — IOHEXOL 350 MG/ML SOLN
100.0000 mL | Freq: Once | INTRAVENOUS | Status: AC | PRN
  Administered 2019-06-11: 100 mL via INTRAVENOUS

## 2019-06-11 MED ORDER — SODIUM CHLORIDE 0.9% FLUSH
3.0000 mL | Freq: Once | INTRAVENOUS | Status: AC
  Administered 2019-06-11: 3 mL via INTRAVENOUS

## 2019-06-11 NOTE — ED Provider Notes (Signed)
[facility] EMERGENCY DEPARTMENT Provider Note   CSN: [id number] Arrival date & time: 06/11/19  1126     History   Chief Complaint Chief Complaint  Patient presents with  . Chest Pain    HPI Alisha [name] is a 68 y.o. female.     The history is provided by the patient and medical records. No language interpreter was used.  Chest Pain  Alisha [name] is a 68 y.o. female who presents to the Emergency Department complaining of chest pain. She presents the emergency department complaining of several weeks of central chest pain. Pain is described as a strong pain type sensation and is waxing and waning. There are no clear alleviating or worsening factors. She denies any objective fevers. No diaphoresis. She does have shortness of breath with dyspnea on exertion. She also complains of progressive bilateral lower extremity edema. She has not taken her fluid pills for the last two days. 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 She is no longer on any anticoagulation. She lives at home alone. She denies any nausea, vomiting, hemoptysis, abdominal pain. No known COVID 19 exposures. Past Medical History:  Diagnosis Date  . Arthritis   . Asthma   . Bipolar disorder ([place])   . CAD (coronary artery disease) 07/07/2016   a. s/p stenting x 2 in [place] to the LAD, RCA is chronically occluded, LVEDP was 34  . Cancer (HCC)    cervical  . Depression   . Fibromyalgia   . GERD (gastroesophageal reflux disease)   . History of benign pituitary tumor   . History of kidney stones    1972  . Hyperlipidemia   . Hypertension   . Migraine   . Morbid obesity ([place])   . Peripheral vascular disease ([place])   . Pneumonia   . Seizures ([place])    during surgery for pituitary tumor  . Sleep apnea    with cpap  . Vertigo     Patient Active Problem List   Diagnosis Date Noted  . Recurrent falls 09/22/2018  . Chronic use of opiate drug for therapeutic  purpose 04/11/2018  . Bradycardia 04/11/2018  . Chronic fatigue 04/11/2018  . Prediabetes 12/17/2017  . Mild cognitive impairment 12/13/2017  . Cough variant asthma vs UACS 10/26/2017  . Upper airway cough syndrome 10/25/2017  . Medial epicondylitis, right 09/16/2017  . Right shoulder pain 09/07/2017  . Lateral epicondylitis of right elbow 09/07/2017  . Vitamin D deficiency 07/13/2017  . History of foot fracture 07/12/2017  . History of colon polyps 07/12/2017  . Cataract of both eyes 07/11/2017  . Hypertensive retinopathy of both eyes 07/11/2017  . Fibromyalgia 05/17/2017  . Tenosynovitis, wrist 05/14/2017  . Chronic cough 05/02/2017  . Foot pain, left 05/02/2017  . Dysphagia 03/23/2017  . Health care maintenance 03/23/2017  . Coronary artery disease involving native coronary artery of native heart without angina pectoris 01/11/2017  . Hyperlipidemia 01/11/2017  . Polypharmacy 11/25/2016  . Venous stasis of both lower extremities 10/04/2016  . Poor dentition 10/04/2016  . OSA (obstructive sleep apnea) 10/04/2016  . History of nephrolithiasis 10/04/2016  . Hx of pilonidal cyst 10/04/2016  . Bipolar depression ([place]) 10/04/2016  . Personal history of DVT (deep vein thrombosis) 10/04/2016  . Morbid obesity due to excess calories ([place]) 09/25/2016  . History of pituitary adenoma s/p resection 09/25/2016  . Essential hypertension 09/25/2016  . Psoriatic arthritis ([place]) 09/25/2016  . Vision impairment 09/25/2016  .  Chronic seasonal allergic rhinitis due to pollen 09/25/2016  . Moderate persistent asthma without complication [id number]  . Gastroesophageal reflux disease without esophagitis 09/25/2016  . Urge incontinence 09/25/2016  . Chronic pain syndrome 09/25/2016  . Candidal intertrigo 05/10/2010    Past Surgical History:  Procedure Laterality Date  . ANKLE FUSION Bilateral   . carpel tunnel release Bilateral 2017  . CORONARY ANGIOPLASTY    . ESOPHAGOGASTRODUODENOSCOPY  (EGD) WITH PROPOFOL N/A 06/14/2017   Procedure: ESOPHAGOGASTRODUODENOSCOPY (EGD) WITH PROPOFOL;  Surgeon: Wilford Corner, MD;  Location: [facility];  Service: Endoscopy;  Laterality: N/A;  . EXCISION / CURETTAGE BONE CYST PHALANGES OF FOOT  2014   Removal of foot cyst   . PARTIAL HYSTERECTOMY  unknown   Patient still has ovaries  . pituitary tumor removal  1999   removed as much as they could, on optic nerve  . TONSILLECTOMY    . TOTAL KNEE ARTHROPLASTY     TKR X 3  2 on the left and 1 on the right     OB History   No obstetric history on file.      Home Medications    Prior to Admission medications   Medication Sig Start Date End Date Taking? Authorizing Provider  albuterol (PROVENTIL) (2.5 MG/3ML) 0.083% nebulizer solution Take 3 mLs (2.5 mg total) by nebulization every 6 (six) hours as needed for wheezing or shortness of breath. 11/10/16   Briscoe Deutscher, DO  albuterol (VENTOLIN HFA) 108 (90 Base) MCG/ACT inhaler Inhale 1 puff into the lungs every 6 (six) hours as needed for wheezing or shortness of breath.    [provider]  budesonide-formoterol (SYMBICORT) 80-4.5 MCG/ACT inhaler Inhale 2 puffs into the lungs 2 (two) times daily. 01/28/18   Lucious Groves, DO  chlorpheniramine (CHLOR-TRIMETON) 4 MG tablet Take 4 mg by mouth 2 (two) times daily as needed for allergies.    [provider]  clopidogrel (PLAVIX) 75 MG tablet Take 1 tablet (75 mg total) by mouth daily. 10/18/18   Pieter Partridge, DO  cyclobenzaprine (FLEXERIL) 5 MG tablet Take 1 tablet (5 mg total) by mouth 3 (three) times daily as needed for muscle spasms. 12/10/18   Drenda Freeze, MD  dextromethorphan (DELSYM) 30 MG/5ML liquid Take 15 mg by mouth 2 (two) times daily as needed for cough.    [provider]  diclofenac (CATAFLAM) 50 MG tablet TAKE 1 TABLET BY MOUTH 3 TIMES A DAY 04/16/19   Tomi Likens, Adam R, DO  diclofenac sodium (VOLTAREN) 1 % GEL Apply 4 g topically 4 (four) times daily.  11/19/18   [provider]  DULoxetine (CYMBALTA) 60 MG capsule Take 1 capsule (60 mg total) by mouth daily. Patient taking differently: Take 60 mg by mouth daily as needed (anxiety).  05/24/17 12/10/18  Lucious Groves, DO  esomeprazole (NEXIUM) 40 MG capsule Take 40 mg by mouth 2 (two) times daily before a meal.    [provider]  fexofenadine (ALLEGRA) 180 MG tablet Take 180 mg by mouth daily.    [provider]  flurbiprofen (ANSAID) 50 MG tablet Take 1 tablet every 8 hours as needed, maximum 3 tablets/24 hours Patient taking differently: Take 50 mg by mouth 3 (three) times daily as needed (pain/arthritis). Take 1 tablet every 8 hours as needed, maximum 3 tablets/24 hours 09/16/18   Tomi Likens, Adam R, DO  furosemide (LASIX) 20 MG tablet Take 2 tablets (40 mg total) by mouth daily. 10/08/17   Minus Breeding, MD  gabapentin (NEURONTIN) 100 MG capsule TAKE 1 CAPSULE BY MOUTH 3 TIMES DAILY Patient taking differently: Take 100 mg by mouth 3 (three) times daily.  11/11/18   Tanda Rockers, MD  isosorbide dinitrate (ISORDIL) 30 MG tablet Take 30 mg by mouth daily.    [provider]  loratadine (CLARITIN) 10 MG tablet Take 10 mg by mouth daily. 05/28/18   [provider]  losartan (COZAAR) 100 MG tablet Take 1 tablet (100 mg total) daily by mouth. 07/06/17   Lucious Groves, DO  metFORMIN (GLUCOPHAGE) 500 MG tablet Take 500 mg by mouth 2 (two) times daily. 11/19/18   [provider]  mirabegron ER (MYRBETRIQ) 50 MG TB24 tablet Take 50 mg by mouth daily.    [provider]  montelukast (SINGULAIR) 10 MG tablet Take 10 mg by mouth daily. 11/19/18   [provider]  morphine (MS CONTIN) 15 MG 12 hr tablet Take 1 tablet (15 mg total) by mouth daily as needed for pain. Patient not taking: Reported on 12/10/2018 04/19/18   Lorella Nimrod, MD  oxybutynin (DITROPAN XL) 15 MG 24 hr tablet Take 15 mg by mouth daily. 11/19/18   [provider]   predniSONE (DELTASONE) 20 MG tablet Take 60 mg daily x 2 days then 40 mg daily x 2 days then 20 mg daily x 2 days 12/10/18   Drenda Freeze, MD  Propylene Glycol (SYSTANE BALANCE) 0.6 % SOLN Apply 1 drop to eye as needed (dry eyes).    [provider]  rosuvastatin (CRESTOR) 40 MG tablet Take 40 mg by mouth daily.    [provider]  tiZANidine (ZANAFLEX) 4 MG tablet TAKE 1&1/2 TABLETS DAILY 01/13/19   Pieter Partridge, DO    Family History Family History  Problem Relation Age of Onset  . Heart disease Mother 63       CABG, pacemaker, valve  . Cancer Father   . Heart disease Sister        Fluid around the heart  . Breast cancer Neg Hx     Social History Social History   Tobacco Use  . Smoking status: Never Smoker  . Smokeless tobacco: Never Used  Substance Use Topics  . Alcohol use: Yes    Comment: Socially. [name].  . Drug use: No     Allergies   Ibuprofen, Ampicillin, Asa [aspirin], Darvon [propoxyphene], Demeclocycline, Eggs or egg-derived products, Lactalbumin, Lisinopril, Milk-related compounds, Salicylates, Tetracycline hcl, and Tetracyclines & related   Review of Systems Review of Systems  Cardiovascular: Positive for chest pain.  All other systems reviewed and are negative.    Physical Exam Updated Vital Signs BP (!) 146/91 (BP Location: Right Arm)   Pulse (!) 58   Temp 97.8 F (36.6 C)   Resp 18   Ht 5\' 7"  (1.702 m)   Wt (!) 142.4 kg   SpO2 97%   BMI 49.18 kg/m   Physical Exam Vitals signs and nursing note reviewed.  Constitutional:      Appearance: She is well-developed.  HENT:     Head: Normocephalic and atraumatic.  Cardiovascular:     Rate and Rhythm: Normal rate and regular rhythm.     Heart sounds: No murmur.  Pulmonary:     Effort: Pulmonary effort is normal. No respiratory distress.     Breath sounds: Normal breath sounds.  Abdominal:     Palpations: Abdomen is soft.     Tenderness: There is no abdominal  tenderness.  There is no guarding or rebound.  Musculoskeletal:        General: Swelling present. No tenderness.     Comments: 3+ pitting edema to BLE  Skin:    General: Skin is warm and dry.  Neurological:     Mental Status: She is alert and oriented to person, place, and time.  Psychiatric:        Behavior: Behavior normal.      ED Treatments / Results  Labs (all labs ordered are listed, but only abnormal results are displayed) Labs Reviewed  BASIC METABOLIC PANEL - Abnormal; Notable for the following components:      Result Value   CO2 18 (*)    Glucose, Bld 121 (*)    All other components within normal limits  D-DIMER, QUANTITATIVE (NOT AT [facility]) - Abnormal; Notable for the following components:   D-Dimer, Quant 1.01 (*)    All other components within normal limits  CBC  BRAIN NATRIURETIC PEPTIDE  TROPONIN I (HIGH SENSITIVITY)  TROPONIN I (HIGH SENSITIVITY)    EKG EKG Interpretation  Date/Time:  Wednesday June 11 2019 11:44:10 EDT Ventricular Rate:  57 PR Interval:  194 QRS Duration: 78 QT Interval:  446 QTC Calculation: 434 R Axis:   -10 Text Interpretation:  Sinus bradycardia Minimal voltage criteria for LVH, may be normal variant ( R in aVL ) Borderline ECG Confirmed by Quintella Reichert [phone]) on 06/11/2019 4:08:32 PM   Radiology Dg Chest 2 View  Result Date: 06/11/2019 CLINICAL DATA:  Shortness of breath and chest pain for 1 week. EXAM: CHEST - 2 VIEW COMPARISON:  12/10/2018 FINDINGS: The heart size and mediastinal contours are within normal limits. Both lungs are clear. Spinal degenerative changes without acute bone process. IMPRESSION: No active cardiopulmonary disease. Electronically Signed   By: Zetta Bills M.D.   On: 06/11/2019 12:21   Ct Angio Chest Pe W/cm &/or Wo Cm  Result Date: 06/11/2019 CLINICAL DATA:  Concern for pulmonary embolism. EXAM: CT ANGIOGRAPHY CHEST WITH CONTRAST TECHNIQUE: Multidetector CT imaging of the chest was performed  using the standard protocol during bolus administration of intravenous contrast. Multiplanar CT image reconstructions and MIPs were obtained to evaluate the vascular anatomy. CONTRAST:  149mL OMNIPAQUE IOHEXOL 350 MG/ML SOLN COMPARISON:  Chest radiograph dated 06/01/2019 and CT chest dated 12/10/2018 FINDINGS: Cardiovascular: Satisfactory opacification of the pulmonary arteries to the segmental level. No evidence of pulmonary embolism. The aortic arch is atherosclerotic. The heart is mildly enlarged. No pericardial effusion. Mediastinum/Nodes: No enlarged mediastinal, hilar, or axillary lymph nodes. Thyroid gland, trachea, and esophagus demonstrate no significant findings. Lungs/Pleura: Lungs are clear. No pleural effusion or pneumothorax. Upper Abdomen: There is a small hiatal hernia. Musculoskeletal: No chest wall abnormality. No acute or significant osseous findings. Degenerative changes are seen in the spine. IMPRESSION: 1.  Negative for acute pulmonary embolism. 2.  Mild cardiomegaly. 3.  Small hiatal hernia. Aortic Atherosclerosis (ICD10-I70.0). Electronically Signed   By: Zerita Boers M.D.   On: 06/11/2019 21:17    Procedures Procedures (including critical care time)  Medications Ordered in ED Medications  sodium chloride flush (NS) 0.9 % injection 3 mL (3 mLs Intravenous Given 06/11/19 1729)  iohexol (OMNIPAQUE) 350 MG/ML injection 100 mL (100 mLs Intravenous Contrast Given 06/11/19 2031)  furosemide (LASIX) injection 40 mg (40 mg Intravenous Given 06/11/19 2310)     Initial Impression / Assessment and Plan / ED Course  I have reviewed the triage vital signs and the nursing notes.  Pertinent labs &  imaging results that were available during my care of the patient were reviewed by me and considered in my medical decision making (see chart for details).        Patient here for evaluation of several weeks of episodic chest pain, shortness of breath. Pain does not change with activities.  Presentation is not consistent with ACS. Troponin is negative times two. She does have some shortness of breath but no respiratory distress. She does have lower extremity edema but has not been compliant with her Lasix for the last few days. CTA was obtained, which was negative for PE. Will treat with dose of Lasix. Discussed importance of cardiology follow-up as well as adhering to her home medications. Return precautions discussed.  Final Clinical Impressions(s) / ED Diagnoses   Final diagnoses:  Atypical chest pain    ED Discharge Orders    None       Quintella Reichert, MD 06/11/19 2357

## 2019-06-11 NOTE — ED Triage Notes (Signed)
To ED via [facility] from dr's office-- with chest pain, started approx 1 and 1/2 weeks ago-- -- hurts to cough and move--

## 2019-07-08 ENCOUNTER — Other Ambulatory Visit: Payer: Self-pay | Admitting: Family

## 2019-07-08 DIAGNOSIS — G8929 Other chronic pain: Secondary | ICD-10-CM

## 2019-07-08 DIAGNOSIS — M5441 Lumbago with sciatica, right side: Secondary | ICD-10-CM

## 2019-07-15 ENCOUNTER — Ambulatory Visit (INDEPENDENT_AMBULATORY_CARE_PROVIDER_SITE_OTHER): Payer: Medicare Other | Admitting: Podiatry

## 2019-07-15 ENCOUNTER — Encounter: Payer: Self-pay | Admitting: Podiatry

## 2019-07-15 ENCOUNTER — Other Ambulatory Visit: Payer: Self-pay

## 2019-07-15 ENCOUNTER — Ambulatory Visit (INDEPENDENT_AMBULATORY_CARE_PROVIDER_SITE_OTHER): Payer: Medicare Other

## 2019-07-15 DIAGNOSIS — Q828 Other specified congenital malformations of skin: Secondary | ICD-10-CM

## 2019-07-15 DIAGNOSIS — M779 Enthesopathy, unspecified: Secondary | ICD-10-CM

## 2019-07-15 DIAGNOSIS — M79674 Pain in right toe(s): Secondary | ICD-10-CM | POA: Diagnosis not present

## 2019-07-15 DIAGNOSIS — B351 Tinea unguium: Secondary | ICD-10-CM

## 2019-07-15 DIAGNOSIS — M79675 Pain in left toe(s): Secondary | ICD-10-CM

## 2019-07-15 DIAGNOSIS — Z7901 Long term (current) use of anticoagulants: Secondary | ICD-10-CM | POA: Diagnosis not present

## 2019-07-20 NOTE — Progress Notes (Signed)
Subjective: 68 y.o. returns the office today for painful, elongated, thickened toenails which she cannot trim herself as well as for calluses. Denies any redness or drainage around the nails/calluses.  She is also been having pain to the left foot today she is requesting steroid injection.  She denies recent injury or falls but she has been trying to walk more recently.  Denies any systemic complaints such as fevers, chills, nausea, vomiting.   PCP: Lucious Groves, DO  She is on plavix  Objective: AAO 3, NAD DP/PT pulses palpable, CRT less than 3 seconds-chronic edema present bilateral Nails hypertrophic, dystrophic, elongated, brittle, discolored 10. There is tenderness overlying the nails 1-5 bilaterally. There is no surrounding erythema or drainage along the nail sites. Hyperkeratotic lesion left fifth metatarsal head, left metatarsal base, right fifth digit.  No ongoing ulceration drainage or signs of infection. There is tenderness on the left foot moving the first interspace proximally.  There is no area of pinpoint tenderness.  Minimal edema but there is no erythema or warmth.  Flexor, extensor tendons appear to be intact. No pain with calf compression, swelling, warmth, erythema.  Assessment: Patient presents with symptomatic onychomycosis; hyperkeratotic lesions; capsulitis/tendonitis  Plan: -Treatment options including alternatives, risks, complications were discussed -Nails sharply debrided 10 without complication/bleeding. -Hyperkeratotic lesion sharply debrided x3 without complications or bleeding -X-rays obtained reviewed.  Arthritic changes present to the foot with hardware intact.  No evidence of acute fracture. -Steroid injection today to the proximal left first interspace. Plan with alcohol and mixture of 1 cc Kenalog 10, 0.5 cc of Marcaine plain and 0.5 cc of lidocaine plain was infiltrated into the area of maximal tenderness without complications.  Postinjection care  discussed. -Will hold off on another injection today.  -Discussed daily foot inspection. If there are any changes, to call the office immediately.  -Follow-up in 3 months or sooner if any problems are to arise. In the meantime, encouraged to call the office with any questions, concerns, changes symptoms.  Celesta Gentile, DPM

## 2019-07-30 ENCOUNTER — Other Ambulatory Visit: Payer: Self-pay

## 2019-07-30 ENCOUNTER — Ambulatory Visit
Admission: RE | Admit: 2019-07-30 | Discharge: 2019-07-30 | Disposition: A | Discharge status: Home / Self Care | Payer: Medicare Other | Source: Ambulatory Visit | Attending: Family | Admitting: Family

## 2019-07-30 DIAGNOSIS — G8929 Other chronic pain: Secondary | ICD-10-CM

## 2019-07-30 DIAGNOSIS — M5441 Lumbago with sciatica, right side: Secondary | ICD-10-CM

## 2019-07-30 DIAGNOSIS — K6289 Other specified diseases of anus and rectum: Secondary | ICD-10-CM

## 2019-08-15 ENCOUNTER — Other Ambulatory Visit: Payer: Self-pay | Admitting: Gastroenterology

## 2019-08-15 DIAGNOSIS — R1032 Left lower quadrant pain: Secondary | ICD-10-CM

## 2019-08-19 ENCOUNTER — Other Ambulatory Visit: Payer: Self-pay | Admitting: Gastroenterology

## 2019-08-19 ENCOUNTER — Other Ambulatory Visit: Payer: Self-pay

## 2019-08-19 ENCOUNTER — Ambulatory Visit
Admission: RE | Admit: 2019-08-19 | Discharge: 2019-08-19 | Disposition: A | Discharge status: Home / Self Care | Payer: Medicare Other | Source: Ambulatory Visit | Attending: Gastroenterology | Admitting: Gastroenterology

## 2019-08-19 DIAGNOSIS — R1032 Left lower quadrant pain: Secondary | ICD-10-CM

## 2019-08-19 MED ORDER — IOPAMIDOL (ISOVUE-300) INJECTION 61%: 125.0000 mL | Freq: Once | INTRAVENOUS | Status: DC | PRN

## 2019-09-01 ENCOUNTER — Other Ambulatory Visit: Payer: Self-pay | Admitting: Gastroenterology

## 2019-09-01 DIAGNOSIS — K769 Liver disease, unspecified: Secondary | ICD-10-CM

## 2019-09-02 ENCOUNTER — Other Ambulatory Visit: Payer: Self-pay | Admitting: Neurology

## 2019-10-03 ENCOUNTER — Other Ambulatory Visit: Payer: Medicare Other

## 2019-10-07 ENCOUNTER — Other Ambulatory Visit: Payer: Self-pay | Admitting: Neurology

## 2019-10-16 ENCOUNTER — Ambulatory Visit: Payer: Medicare Other | Admitting: Podiatry

## 2019-10-21 ENCOUNTER — Encounter: Payer: Self-pay | Admitting: Podiatry

## 2019-10-21 ENCOUNTER — Emergency Department (HOSPITAL_COMMUNITY): Payer: Medicare Other

## 2019-10-21 ENCOUNTER — Emergency Department (HOSPITAL_COMMUNITY)
Admission: EM | Admit: 2019-10-21 | Discharge: 2019-10-22 | Disposition: A | Discharge status: Home / Self Care | Payer: Medicare Other | Attending: Emergency Medicine | Admitting: Emergency Medicine

## 2019-10-21 ENCOUNTER — Encounter (HOSPITAL_COMMUNITY): Payer: Self-pay | Admitting: Emergency Medicine

## 2019-10-21 ENCOUNTER — Emergency Department (HOSPITAL_BASED_OUTPATIENT_CLINIC_OR_DEPARTMENT_OTHER): Payer: Medicare Other

## 2019-10-21 ENCOUNTER — Ambulatory Visit (INDEPENDENT_AMBULATORY_CARE_PROVIDER_SITE_OTHER): Payer: Medicare Other

## 2019-10-21 ENCOUNTER — Other Ambulatory Visit: Payer: Self-pay

## 2019-10-21 ENCOUNTER — Ambulatory Visit (INDEPENDENT_AMBULATORY_CARE_PROVIDER_SITE_OTHER): Payer: Medicare Other | Admitting: Podiatry

## 2019-10-21 DIAGNOSIS — R0602 Shortness of breath: Secondary | ICD-10-CM | POA: Insufficient documentation

## 2019-10-21 DIAGNOSIS — Z7984 Long term (current) use of oral hypoglycemic drugs: Secondary | ICD-10-CM | POA: Insufficient documentation

## 2019-10-21 DIAGNOSIS — R609 Edema, unspecified: Secondary | ICD-10-CM

## 2019-10-21 DIAGNOSIS — M79671 Pain in right foot: Secondary | ICD-10-CM

## 2019-10-21 DIAGNOSIS — I10 Essential (primary) hypertension: Secondary | ICD-10-CM | POA: Insufficient documentation

## 2019-10-21 DIAGNOSIS — Z96652 Presence of left artificial knee joint: Secondary | ICD-10-CM | POA: Insufficient documentation

## 2019-10-21 DIAGNOSIS — Z20822 Contact with and (suspected) exposure to covid-19: Secondary | ICD-10-CM | POA: Insufficient documentation

## 2019-10-21 DIAGNOSIS — M79609 Pain in unspecified limb: Secondary | ICD-10-CM | POA: Diagnosis not present

## 2019-10-21 DIAGNOSIS — M722 Plantar fascial fibromatosis: Secondary | ICD-10-CM | POA: Diagnosis not present

## 2019-10-21 DIAGNOSIS — R05 Cough: Secondary | ICD-10-CM | POA: Diagnosis not present

## 2019-10-21 DIAGNOSIS — Z96651 Presence of right artificial knee joint: Secondary | ICD-10-CM | POA: Diagnosis not present

## 2019-10-21 DIAGNOSIS — I2510 Atherosclerotic heart disease of native coronary artery without angina pectoris: Secondary | ICD-10-CM | POA: Insufficient documentation

## 2019-10-21 DIAGNOSIS — R2243 Localized swelling, mass and lump, lower limb, bilateral: Secondary | ICD-10-CM | POA: Insufficient documentation

## 2019-10-21 DIAGNOSIS — Z79899 Other long term (current) drug therapy: Secondary | ICD-10-CM | POA: Insufficient documentation

## 2019-10-21 DIAGNOSIS — G8929 Other chronic pain: Secondary | ICD-10-CM

## 2019-10-21 LAB — CBC
HCT: 41.8 % (ref 36.0–46.0)
Hemoglobin: 13.5 g/dL (ref 12.0–15.0)
MCH: 28.5 pg (ref 26.0–34.0)
MCHC: 32.3 g/dL (ref 30.0–36.0)
MCV: 88.2 fL (ref 80.0–100.0)
Platelets: 239 10*3/uL (ref 150–400)
RBC: 4.74 MIL/uL (ref 3.87–5.11)
RDW: 12.9 % (ref 11.5–15.5)
WBC: 6.9 10*3/uL (ref 4.0–10.5)
nRBC: 0 % (ref 0.0–0.2)

## 2019-10-21 LAB — BASIC METABOLIC PANEL
Anion gap: 11 (ref 5–15)
BUN: 9 mg/dL (ref 8–23)
CO2: 21 mmol/L — ABNORMAL LOW (ref 22–32)
Calcium: 9.3 mg/dL (ref 8.9–10.3)
Chloride: 107 mmol/L (ref 98–111)
Creatinine, Ser: 1.33 mg/dL — ABNORMAL HIGH (ref 0.44–1.00)
GFR calc Af Amer: 47 mL/min — ABNORMAL LOW (ref >60)
GFR calc non Af Amer: 40 mL/min — ABNORMAL LOW (ref >60)
Glucose, Bld: 166 mg/dL — ABNORMAL HIGH (ref 70–99)
Potassium: 3.7 mmol/L (ref 3.5–5.1)
Sodium: 139 mmol/L (ref 135–145)

## 2019-10-21 MED ORDER — SODIUM CHLORIDE 0.9 % IV SOLN: 1.0000 g | Freq: Once | INTRAVENOUS | Status: DC

## 2019-10-21 MED ORDER — FUROSEMIDE 10 MG/ML IJ SOLN
40.0000 mg | Freq: Once | INTRAMUSCULAR | Status: DC
  Filled 2019-10-21: qty 4

## 2019-10-21 MED ORDER — SODIUM CHLORIDE 0.9 % IV SOLN: 500.0000 mg | Freq: Once | INTRAVENOUS | Status: DC

## 2019-10-21 NOTE — ED Provider Notes (Signed)
Venous doppler study of RLE was ordered by me per nursing request.  Pt did not directly evaluate this patient.    Domenic Moras, PA-C 10/21/19 1939    Quintella Reichert, MD 10/21/19 2241

## 2019-10-21 NOTE — ED Triage Notes (Signed)
Patient states she was sent by [facility] for further evaluation of right leg swelling and pain.   Patient also having productive cough with green mucus x 2 weeks.

## 2019-10-21 NOTE — ED Provider Notes (Signed)
Medical screening examination/treatment/procedure(s) were conducted as a shared visit with non-physician practitioner(s) and myself.  I personally evaluated the patient during the encounter.  EKG Interpretation  Date/Time:  Tuesday October 21 2019 18:11:27 EST Ventricular Rate:  99 PR Interval:  166 QRS Duration: 86 QT Interval:  372 QTC Calculation: 477 R Axis:   -17 Text Interpretation: Normal sinus rhythm Minimal voltage criteria for LVH, may be normal variant ( R in aVL ) Cannot rule out Anterior infarct , age undetermined Abnormal ECG Confirmed by Lacretia Leigh [phone]) on 10/21/2019 10:2:76 PM  69 year old female presents with increasing dyspnea as well as right lower extremity swelling.  Doppler lower extremity negative for DVT.  Patient has a history of CHF and has been off her medications.  Concern for possible PE and will obtain CT of the chest.  We will also give diuretics and likely admit   Lacretia Leigh, MD 10/21/19 2317

## 2019-10-21 NOTE — ED Notes (Signed)
Unable to place IV. Attempted x3. MD notified

## 2019-10-21 NOTE — ED Provider Notes (Signed)
[facility] EMERGENCY DEPARTMENT Provider Note   CSN: [id number] Arrival date & time: 10/21/19  1803     History Chief Complaint  Patient presents with  . Leg Swelling    Alisha [name] is a 69 y.o. female with history of CAD, morbid obesity, hx of cervical cancer who presents with leg swelling.  Patient states over the past 3 weeks she has had increased swelling in her legs but the right worse than the left and worsening shortness of breath.  She is ambulatory with a walker but also uses a wheelchair at times.  She gets dyspneic with exertion.  She also reports associated cough productive of greenish-yellow sputum with sometimes gets blood streaks in the sputum.  She denies fever or chills, lightheadedness, syncope, chest pain.  She went to the podiatrist today and was advised to come to the ED due to significant swelling of the legs to rule out DVT.  DVT study was ordered in triage and is negative but limited due to body habitus.  Patient also reports that she does not think she has been taking the Lasix because her granddaughter was fixing her pillbox and she noticed that the Lasix bottle was full.  She also complains of right foot pain states it feels like walking on glass. Echo shows EF 60%.  HPI     Past Medical History:  Diagnosis Date  . Arthritis   . Asthma   . Bipolar disorder ([place])   . CAD (coronary artery disease) 07/07/2016   a. s/p stenting x 2 in [place] to the LAD, RCA is chronically occluded, LVEDP was 34  . Cancer (HCC)    cervical  . Depression   . Fibromyalgia   . GERD (gastroesophageal reflux disease)   . History of benign pituitary tumor   . History of kidney stones    1972  . Hyperlipidemia   . Hypertension   . Migraine   . Morbid obesity ([place])   . Peripheral vascular disease ([place])   . Pneumonia   . Seizures ([place])    during surgery for pituitary tumor  . Sleep apnea    with cpap  . Vertigo     Patient Active Problem List   Diagnosis Date Noted  . Recurrent falls 09/22/2018  . Chronic use of opiate drug for therapeutic purpose 04/11/2018  . Bradycardia 04/11/2018  . Chronic fatigue 04/11/2018  . Prediabetes 12/17/2017  . Mild cognitive impairment 12/13/2017  . Cough variant asthma vs UACS 10/26/2017  . Upper airway cough syndrome 10/25/2017  . Medial epicondylitis, right 09/16/2017  . Right shoulder pain 09/07/2017  . Lateral epicondylitis of right elbow 09/07/2017  . Vitamin D deficiency 07/13/2017  . History of foot fracture 07/12/2017  . History of colon polyps 07/12/2017  . Cataract of both eyes 07/11/2017  . Hypertensive retinopathy of both eyes 07/11/2017  . Fibromyalgia 05/17/2017  . Tenosynovitis, wrist 05/14/2017  . Chronic cough 05/02/2017  . Foot pain, left 05/02/2017  . Dysphagia 03/23/2017  . Health care maintenance 03/23/2017  . Coronary artery disease involving native coronary artery of native heart without angina pectoris 01/11/2017  . Hyperlipidemia 01/11/2017  . Polypharmacy 11/25/2016  . Venous stasis of both lower extremities 10/04/2016  . Poor dentition 10/04/2016  . OSA (obstructive sleep apnea) 10/04/2016  . History of nephrolithiasis 10/04/2016  . Hx of pilonidal cyst 10/04/2016  . Bipolar depression ([place]) 10/04/2016  . Personal history of DVT (deep vein thrombosis) 10/04/2016  . Morbid obesity due  to excess calories ([place]) 09/25/2016  . History of pituitary adenoma s/p resection 09/25/2016  . Essential hypertension 09/25/2016  . Psoriatic arthritis ([place]) 09/25/2016  . Vision impairment 09/25/2016  . Chronic seasonal allergic rhinitis due to pollen 09/25/2016  . Moderate persistent asthma without complication [id number]  . Gastroesophageal reflux disease without esophagitis 09/25/2016  . Urge incontinence 09/25/2016  . Chronic pain syndrome 09/25/2016  . Candidal intertrigo 05/10/2010    Past Surgical History:  Procedure Laterality Date  . ANKLE FUSION Bilateral     . carpel tunnel release Bilateral 2017  . CORONARY ANGIOPLASTY    . ESOPHAGOGASTRODUODENOSCOPY (EGD) WITH PROPOFOL N/A 06/14/2017   Procedure: ESOPHAGOGASTRODUODENOSCOPY (EGD) WITH PROPOFOL;  Surgeon: Wilford Corner, MD;  Location: [place];  Service: Endoscopy;  Laterality: N/A;  . EXCISION / CURETTAGE BONE CYST PHALANGES OF FOOT  2014   Removal of foot cyst   . PARTIAL HYSTERECTOMY  unknown   Patient still has ovaries  . pituitary tumor removal  1999   removed as much as they could, on optic nerve  . TONSILLECTOMY    . TOTAL KNEE ARTHROPLASTY     TKR X 3  2 on the left and 1 on the right     OB History   No obstetric history on file.     Family History  Problem Relation Age of Onset  . Heart disease Mother 31       CABG, pacemaker, valve  . Cancer Father   . Heart disease Sister        Fluid around the heart  . Breast cancer Neg Hx     Social History   Tobacco Use  . Smoking status: Never Smoker  . Smokeless tobacco: Never Used  Substance Use Topics  . Alcohol use: Yes    Comment: Socially. [name].  . Drug use: No    Home Medications Prior to Admission medications   Medication Sig Start Date End Date Taking? Authorizing Provider  albuterol (PROVENTIL) (2.5 MG/3ML) 0.083% nebulizer solution Take 3 mLs (2.5 mg total) by nebulization every 6 (six) hours as needed for wheezing or shortness of breath. 11/10/16   Briscoe Deutscher, DO  albuterol (VENTOLIN HFA) 108 (90 Base) MCG/ACT inhaler Inhale 1 puff into the lungs every 6 (six) hours as needed for wheezing or shortness of breath.    [provider]  budesonide-formoterol (SYMBICORT) 80-4.5 MCG/ACT inhaler Inhale 2 puffs into the lungs 2 (two) times daily. 01/28/18   Lucious Groves, DO  chlorpheniramine (CHLOR-TRIMETON) 4 MG tablet Take 4 mg by mouth 2 (two) times daily as needed for allergies.    [provider]  clopidogrel (PLAVIX) 75 MG tablet Take 1 tablet (75 mg total) by mouth daily.  10/18/18   Pieter Partridge, DO  cyclobenzaprine (FLEXERIL) 5 MG tablet Take 1 tablet (5 mg total) by mouth 3 (three) times daily as needed for muscle spasms. 12/10/18   Drenda Freeze, MD  dextromethorphan (DELSYM) 30 MG/5ML liquid Take 15 mg by mouth 2 (two) times daily as needed for cough.    [provider]  diclofenac (CATAFLAM) 50 MG tablet TAKE 1 TABLET BY MOUTH 3 TIMES A DAY 04/16/19   Tomi Likens, Adam R, DO  diclofenac sodium (VOLTAREN) 1 % GEL Apply 4 g topically 4 (four) times daily. 11/19/18   [provider]  DULoxetine (CYMBALTA) 60 MG capsule Take 1 capsule (60 mg total) by mouth daily. Patient taking differently: Take 60 mg by mouth daily as  needed (anxiety).  05/24/17 12/10/18  Lucious Groves, DO  esomeprazole (NEXIUM) 40 MG capsule Take 40 mg by mouth 2 (two) times daily before a meal.    [provider]  fexofenadine (ALLEGRA) 180 MG tablet Take 180 mg by mouth daily.    [provider]  flurbiprofen (ANSAID) 50 MG tablet Take 1 tablet every 8 hours as needed, maximum 3 tablets/24 hours Patient taking differently: Take 50 mg by mouth 3 (three) times daily as needed (pain/arthritis). Take 1 tablet every 8 hours as needed, maximum 3 tablets/24 hours 09/16/18   Tomi Likens, Adam R, DO  furosemide (LASIX) 20 MG tablet Take 2 tablets (40 mg total) by mouth daily. 10/08/17   Minus Breeding, MD  gabapentin (NEURONTIN) 100 MG capsule TAKE 1 CAPSULE BY MOUTH 3 TIMES DAILY Patient taking differently: Take 100 mg by mouth 3 (three) times daily.  11/11/18   Tanda Rockers, MD  gabapentin (NEURONTIN) 300 MG capsule Take 300 mg by mouth 3 (three) times daily. 10/07/19   [provider]  isosorbide dinitrate (ISORDIL) 30 MG tablet Take 30 mg by mouth daily.    [provider]  loratadine (CLARITIN) 10 MG tablet Take 10 mg by mouth daily. 05/28/18   [provider]  losartan (COZAAR) 100 MG tablet Take 1 tablet (100 mg total) daily by mouth. 07/06/17    Lucious Groves, DO  metFORMIN (GLUCOPHAGE) 500 MG tablet Take 500 mg by mouth 2 (two) times daily. 11/19/18   [provider]  mirabegron ER (MYRBETRIQ) 50 MG TB24 tablet Take 50 mg by mouth daily.    [provider]  montelukast (SINGULAIR) 10 MG tablet Take 10 mg by mouth daily. 11/19/18   [provider]  morphine (MS CONTIN) 15 MG 12 hr tablet Take 1 tablet (15 mg total) by mouth daily as needed for pain. 04/19/18   Lorella Nimrod, MD  oxybutynin (DITROPAN XL) 15 MG 24 hr tablet Take 15 mg by mouth daily. 11/19/18   [provider]  pantoprazole (PROTONIX) 40 MG tablet Take 40 mg by mouth 2 (two) times daily. 10/07/19   [provider]  predniSONE (DELTASONE) 20 MG tablet Take 60 mg daily x 2 days then 40 mg daily x 2 days then 20 mg daily x 2 days 12/10/18   Drenda Freeze, MD  Propylene Glycol (SYSTANE BALANCE) 0.6 % SOLN Apply 1 drop to eye as needed (dry eyes).    [provider]  rosuvastatin (CRESTOR) 40 MG tablet Take 40 mg by mouth daily.    [provider]  SPIRIVA RESPIMAT 1.25 MCG/ACT AERS SMARTSIG:2 Puff(s) Via Inhaler Daily 10/07/19   [provider]  tiZANidine (ZANAFLEX) 4 MG tablet TAKE 1&1/2 TABLETS DAILY 09/02/19   Pieter Partridge, DO    Allergies    Ibuprofen, Ampicillin, Asa [aspirin], Darvon [propoxyphene], Demeclocycline, Eggs or egg-derived products, Lactalbumin, Lisinopril, Milk-related compounds, Salicylates, Tetracycline hcl, and Tetracyclines & related  Review of Systems   Review of Systems  Constitutional: Negative for chills and fever.  Respiratory: Positive for cough and shortness of breath. Negative for wheezing.   Cardiovascular: Positive for leg swelling. Negative for chest pain and palpitations.  Gastrointestinal: Negative for abdominal pain.  All other systems reviewed and are negative.   Physical Exam Updated Vital Signs BP (!) 164/91   Pulse 98   Temp 99 F (37.2 C) (Oral)   Resp 20    Ht 5\' 6"  (1.676 m)   Wt Marland Kitchen)  142 kg   SpO2 100%   BMI 50.52 kg/m   Physical Exam Vitals and nursing note reviewed.  Constitutional:      General: She is not in acute distress.    Appearance: She is well-developed. She is obese. She is not ill-appearing.     Comments: Calm and cooperative. Tachypneic with speaking. Morbidly obese  HENT:     Head: Normocephalic and atraumatic.  Eyes:     General: No scleral icterus.       Right eye: No discharge.        Left eye: No discharge.     Conjunctiva/sclera: Conjunctivae normal.     Pupils: Pupils are equal, round, and reactive to light.  Cardiovascular:     Rate and Rhythm: Normal rate and regular rhythm.  Pulmonary:     Effort: Pulmonary effort is normal. No respiratory distress.     Breath sounds: Normal breath sounds.  Abdominal:     General: There is no distension.     Palpations: Abdomen is soft.     Tenderness: There is no abdominal tenderness.  Musculoskeletal:     Cervical back: Normal range of motion.     Right lower leg: Edema present.     Left lower leg: Edema present.     Comments: Significant lower extremity edema with chronic skin changes. R leg is more swollen than left and appears erythematous. No open wounds noted. Palpable DP pulses bilaterally  Skin:    General: Skin is warm and dry.  Neurological:     Mental Status: She is alert and oriented to person, place, and time.  Psychiatric:        Behavior: Behavior normal.     ED Results / Procedures / Treatments   Labs (all labs ordered are listed, but only abnormal results are displayed) Labs Reviewed  BASIC METABOLIC PANEL - Abnormal; Notable for the following components:      Result Value   CO2 21 (*)    Glucose, Bld 166 (*)    Creatinine, Ser 1.33 (*)    GFR calc non Af Amer 40 (*)    GFR calc Af Amer 47 (*)    All other components within normal limits  CBC  BRAIN NATRIURETIC PEPTIDE  POC SARS CORONAVIRUS 2 AG -  ED  TROPONIN I (HIGH SENSITIVITY)    TROPONIN I (HIGH SENSITIVITY)    EKG EKG Interpretation  Date/Time:  Tuesday October 21 2019 18:11:27 EST Ventricular Rate:  99 PR Interval:  166 QRS Duration: 86 QT Interval:  372 QTC Calculation: 477 R Axis:   -17 Text Interpretation: Normal sinus rhythm Minimal voltage criteria for LVH, may be normal variant ( R in aVL ) Cannot rule out Anterior infarct , age undetermined Abnormal ECG Confirmed by Lacretia Leigh ([id number]) on 10/21/2019 10:28:14 PM   Radiology DG Chest Port 1 View  Result Date: 10/21/2019 CLINICAL DATA:  Shortness breath EXAM: PORTABLE CHEST 1 VIEW COMPARISON:  June 11, 2019 FINDINGS: The heart size and mediastinal contours are within normal limits. Mildly hazy airspace opacity seen at the right lung base. The visualized skeletal structures are unremarkable. IMPRESSION: Mildly hazy airspace opacity at the right lung base which could be due to atelectasis and/or early infectious etiology. Electronically Signed   By: Prudencio Pair M.D.   On: 10/21/2019 22:19   VAS [place] LOWER EXTREMITY VENOUS (DVT) (ONLY [facility] & [facility] 7a-7p)  Result Date: 10/21/2019  Lower Venous DVTStudy Indications: Foot pain x 3 weeks.  Limitations: Body habitus, poor ultrasound/tissue interface and patient mobility. Performing Technologist: Antonieta Pert RDMS, RVT  Examination Guidelines: A complete evaluation includes B-mode imaging, spectral Doppler, color Doppler, and power Doppler as needed of all accessible portions of each vessel. Bilateral testing is considered an integral part of a complete examination. Limited examinations for reoccurring indications may be performed as noted. The reflux portion of the exam is performed with the patient in reverse Trendelenburg.  +---------+---------------+---------+-----------+----------+--------------+ RIGHT    CompressibilityPhasicitySpontaneityPropertiesThrombus Aging +---------+---------------+---------+-----------+----------+--------------+ CFV      Full            Yes      Yes                                 +---------+---------------+---------+-----------+----------+--------------+ SFJ      Full                                                        +---------+---------------+---------+-----------+----------+--------------+ FV Mid                                                Not visualized +---------+---------------+---------+-----------+----------+--------------+ FV Distal               Yes      Yes                                 +---------+---------------+---------+-----------+----------+--------------+ PFV      Full                                                        +---------+---------------+---------+-----------+----------+--------------+ POP                     Yes      Yes                                 +---------+---------------+---------+-----------+----------+--------------+ PTV                                                   Not visualized +---------+---------------+---------+-----------+----------+--------------+ PERO                                                  Not visualized +---------+---------------+---------+-----------+----------+--------------+ VERY LIMITED EXAM  +----+---------------+---------+-----------+----------+--------------+ LEFTCompressibilityPhasicitySpontaneityPropertiesThrombus Aging +----+---------------+---------+-----------+----------+--------------+ CFV                                              Not visualized +----+---------------+---------+-----------+----------+--------------+  Summary: RIGHT: - There is no evidence of deep vein thrombosis in the lower extremity. However, portions of this examination were limited- see technologist comments above.  - No cystic structure found in the popliteal fossa.   *See table(s) above for measurements and observations.    Preliminary     Procedures Procedures (including critical care  time)  Medications Ordered in ED Medications  furosemide (LASIX) injection 40 mg (has no administration in time range)  cefTRIAXone (ROCEPHIN) 1 g in sodium chloride 0.9 % 100 mL IVPB (has no administration in time range)  azithromycin (ZITHROMAX) 500 mg in sodium chloride 0.9 % 250 mL IVPB (has no administration in time range)    ED Course  I have reviewed the triage vital signs and the nursing notes.  Pertinent labs & imaging results that were available during my care of the patient were reviewed by me and considered in my medical decision making (see chart for details).  69 year old female presents with SOB, productive cough, and significant leg swelling. Symptoms have been going on for several weeks. She thinks she has been not been taking Lasix due to medication mix up. BP is elevated here. She is afebrile. Heart is regular rate and rhythm. She is SOB with talking but lungs are CTA. Sats are 100%. She has significant lower extremity edema with R>L. DVT study in negative but limited due to body habitus. EKG is SR. Will add on CXR, BNP, Trop. Shared visit with Dr. Zenia Resides.  CBC is normal. BMP is remarkable for low bicarb (21) and AKI (SCr is 1.3 up from .24 in Oct).   Work up has been limited due to inability to get IV access. IV access was attempted by 2 nurses, IV team, and Dr. Stark Jock. Will continue to try to obtain access to give meds  Care signed out to K Tama Headings.  MDM Rules/Calculators/A&P                       Final Clinical Impression(s) / ED Diagnoses Final diagnoses:  Peripheral edema    Rx / DC Orders ED Discharge Orders    None       Recardo Evangelist, PA-C 10/22/19 0049    Veryl Speak, MD 10/22/19 [phone]

## 2019-10-21 NOTE — Progress Notes (Signed)
Subjective: 69 year old female presents the office today for concerns of worsening right foot pain was not on the last 1 week.  She has had also reporting increased right lower extremity edema as well as shortness of breath.  She does not have any chest pain currently but she does get chest tightness at times and has been a chronic issue for last 2 months.  She did go to the emergency room in October but she has had no follow-up other than seeing a primary care doctor, which I am unable to see any clinic notes for.  She is on Lasix.  Due to the swelling in her legs is not able to bend her knee if she is having balance issues and she has had several falls. Denies any systemic complaints such as fevers, chills, nausea, vomiting. No acute changes since last appointment, and no other complaints at this time.   Objective: AAO x3, NAD DP/PT pulses palpable bilaterally, CRT less than 3 seconds Significant edema to the right lower extremity.  There is tenderness on the plantar aspect of the heel and the insertion of plantar fascia.  Mild discomfort to the lateral aspect of the heel.  No erythema or warmth there is no open lesions. No pain with calf compression, swelling, warmth, erythema  Assessment: Right foot pain on fasciitis however more concern for increased lower extremity edema, shortness of breath  Plan: -All treatment options discussed with the patient including all alternatives, risks, complications.  -X-rays obtained reviewed although limited.  Hardware intact in prior surgery but no evidence of acute fracture -I do think she is having plantar fasciitis but her main concern currently is the right lower extremity edema, balance issues and falls and also increased shortness of breath.  Is also been chest pressure as well however this is been more chronic.  Given her symptoms I am worried about DVT, PE and cardiac issues.  I recommended her to go to the emergency department.  She does not want to go  by ambulance and she understands risks of this.  Her daughter accompanies her and will bring her to the emergency room immediately.  I contacted the ER at [facility] to let them know.   Trula Slade DPM

## 2019-10-21 NOTE — Progress Notes (Addendum)
Right lower extremity venous duplex complete.  Please see CV Proc tab for preliminary results.  Preliminary results called to Domenic Moras @ 19:40. Lita Mains- RDMS, RVT 7:48 PM  10/21/2019

## 2019-10-22 DIAGNOSIS — R2243 Localized swelling, mass and lump, lower limb, bilateral: Secondary | ICD-10-CM | POA: Diagnosis not present

## 2019-10-22 LAB — POCT I-STAT 7, (LYTES, BLD GAS, ICA,H+H)
Bicarbonate: 24.1 mmol/L (ref 20.0–28.0)
Calcium, Ion: 1.24 mmol/L (ref 1.15–1.40)
HCT: 38 % (ref 36.0–46.0)
Hemoglobin: 12.9 g/dL (ref 12.0–15.0)
O2 Saturation: 98 %
Patient temperature: 98.3
Potassium: 3.4 mmol/L — ABNORMAL LOW (ref 3.5–5.1)
Sodium: 142 mmol/L (ref 135–145)
TCO2: 25 mmol/L (ref 22–32)
pCO2 arterial: 35.6 mmHg (ref 32.0–48.0)
pH, Arterial: 7.437 (ref 7.350–7.450)
pO2, Arterial: 98 mmHg (ref 83.0–108.0)

## 2019-10-22 LAB — POC SARS CORONAVIRUS 2 AG -  ED: SARS Coronavirus 2 Ag: NEGATIVE

## 2019-10-22 LAB — TROPONIN I (HIGH SENSITIVITY): Troponin I (High Sensitivity): 9 ng/L (ref <18)

## 2019-10-22 LAB — BRAIN NATRIURETIC PEPTIDE: B Natriuretic Peptide: 36.7 pg/mL (ref 0.0–100.0)

## 2019-10-22 MED ORDER — FUROSEMIDE 10 MG/ML IJ SOLN
80.0000 mg | Freq: Once | INTRAMUSCULAR | Status: AC
  Administered 2019-10-22: 80 mg via INTRAMUSCULAR
  Filled 2019-10-22: qty 8

## 2019-10-22 MED ORDER — AZITHROMYCIN 250 MG PO TABS: 250.0000 mg | ORAL_TABLET | Freq: Every day | ORAL | 0 refills | Status: DC

## 2019-10-22 MED ORDER — FUROSEMIDE 40 MG PO TABS: 40.0000 mg | ORAL_TABLET | Freq: Every day | ORAL | 1 refills | Status: DC

## 2019-10-22 MED ORDER — LIDOCAINE HCL (PF) 1 % IJ SOLN
INTRAMUSCULAR | Status: AC
  Filled 2019-10-22: qty 5

## 2019-10-22 MED ORDER — CEFTRIAXONE SODIUM 1 G IJ SOLR
1.0000 g | Freq: Once | INTRAMUSCULAR | Status: AC
  Administered 2019-10-22: 1 g via INTRAMUSCULAR
  Filled 2019-10-22: qty 10

## 2019-10-22 MED ORDER — AZITHROMYCIN 250 MG PO TABS
500.0000 mg | ORAL_TABLET | Freq: Once | ORAL | Status: AC
  Administered 2019-10-22: 500 mg via ORAL
  Filled 2019-10-22: qty 2

## 2019-10-22 NOTE — Discharge Instructions (Addendum)
Begin taking Zithromax as prescribed.  Resume your Lasix as previously prescribed.  Follow-up with your primary doctor if you develop worsening breathing, high fever, chest pain, or other new and concerning symptoms.

## 2019-10-22 NOTE — ED Notes (Signed)
Ambulated patient down hall. Saturation dropped to 90% but maintained >90% during ambulation

## 2019-10-23 ENCOUNTER — Other Ambulatory Visit: Payer: Self-pay | Admitting: Podiatry

## 2019-10-23 DIAGNOSIS — M722 Plantar fascial fibromatosis: Secondary | ICD-10-CM

## 2019-10-27 ENCOUNTER — Ambulatory Visit: Payer: Medicare Other | Admitting: Podiatry

## 2019-11-03 ENCOUNTER — Other Ambulatory Visit: Payer: Self-pay

## 2019-11-03 ENCOUNTER — Inpatient Hospital Stay (HOSPITAL_COMMUNITY)
Admission: EM | Admit: 2019-11-03 | Discharge: 2019-11-06 | DRG: 315 | Disposition: A | Discharge status: Skilled Nursing Facility | Payer: Medicare Other | Attending: Internal Medicine | Admitting: Internal Medicine

## 2019-11-03 ENCOUNTER — Emergency Department (HOSPITAL_COMMUNITY): Payer: Medicare Other

## 2019-11-03 ENCOUNTER — Encounter (HOSPITAL_COMMUNITY): Payer: Self-pay | Admitting: Emergency Medicine

## 2019-11-03 ENCOUNTER — Inpatient Hospital Stay (HOSPITAL_COMMUNITY): Payer: Medicare Other

## 2019-11-03 DIAGNOSIS — Y93E1 Activity, personal bathing and showering: Secondary | ICD-10-CM | POA: Diagnosis not present

## 2019-11-03 DIAGNOSIS — K5900 Constipation, unspecified: Secondary | ICD-10-CM | POA: Diagnosis not present

## 2019-11-03 DIAGNOSIS — F3130 Bipolar disorder, current episode depressed, mild or moderate severity, unspecified: Secondary | ICD-10-CM | POA: Diagnosis present

## 2019-11-03 DIAGNOSIS — D649 Anemia, unspecified: Secondary | ICD-10-CM | POA: Diagnosis present

## 2019-11-03 DIAGNOSIS — R296 Repeated falls: Secondary | ICD-10-CM | POA: Diagnosis present

## 2019-11-03 DIAGNOSIS — Z7902 Long term (current) use of antithrombotics/antiplatelets: Secondary | ICD-10-CM

## 2019-11-03 DIAGNOSIS — Z96653 Presence of artificial knee joint, bilateral: Secondary | ICD-10-CM | POA: Diagnosis present

## 2019-11-03 DIAGNOSIS — N179 Acute kidney failure, unspecified: Secondary | ICD-10-CM | POA: Diagnosis present

## 2019-11-03 DIAGNOSIS — Z886 Allergy status to analgesic agent status: Secondary | ICD-10-CM

## 2019-11-03 DIAGNOSIS — W182XXA Fall in (into) shower or empty bathtub, initial encounter: Secondary | ICD-10-CM | POA: Diagnosis present

## 2019-11-03 DIAGNOSIS — E869 Volume depletion, unspecified: Secondary | ICD-10-CM | POA: Diagnosis present

## 2019-11-03 DIAGNOSIS — E860 Dehydration: Secondary | ICD-10-CM | POA: Diagnosis present

## 2019-11-03 DIAGNOSIS — M797 Fibromyalgia: Secondary | ICD-10-CM | POA: Diagnosis present

## 2019-11-03 DIAGNOSIS — N3281 Overactive bladder: Secondary | ICD-10-CM | POA: Diagnosis present

## 2019-11-03 DIAGNOSIS — Z885 Allergy status to narcotic agent status: Secondary | ICD-10-CM

## 2019-11-03 DIAGNOSIS — M1990 Unspecified osteoarthritis, unspecified site: Secondary | ICD-10-CM | POA: Diagnosis present

## 2019-11-03 DIAGNOSIS — Z6841 Body Mass Index (BMI) 40.0 and over, adult: Secondary | ICD-10-CM

## 2019-11-03 DIAGNOSIS — G4733 Obstructive sleep apnea (adult) (pediatric): Secondary | ICD-10-CM | POA: Diagnosis present

## 2019-11-03 DIAGNOSIS — Z981 Arthrodesis status: Secondary | ICD-10-CM

## 2019-11-03 DIAGNOSIS — R1031 Right lower quadrant pain: Secondary | ICD-10-CM | POA: Diagnosis present

## 2019-11-03 DIAGNOSIS — Z20822 Contact with and (suspected) exposure to covid-19: Secondary | ICD-10-CM | POA: Diagnosis present

## 2019-11-03 DIAGNOSIS — R531 Weakness: Secondary | ICD-10-CM

## 2019-11-03 DIAGNOSIS — J45909 Unspecified asthma, uncomplicated: Secondary | ICD-10-CM | POA: Diagnosis present

## 2019-11-03 DIAGNOSIS — I10 Essential (primary) hypertension: Secondary | ICD-10-CM | POA: Diagnosis present

## 2019-11-03 DIAGNOSIS — I2510 Atherosclerotic heart disease of native coronary artery without angina pectoris: Secondary | ICD-10-CM | POA: Diagnosis present

## 2019-11-03 DIAGNOSIS — Z880 Allergy status to penicillin: Secondary | ICD-10-CM

## 2019-11-03 DIAGNOSIS — Z86718 Personal history of other venous thrombosis and embolism: Secondary | ICD-10-CM

## 2019-11-03 DIAGNOSIS — R600 Localized edema: Secondary | ICD-10-CM | POA: Diagnosis present

## 2019-11-03 DIAGNOSIS — Z888 Allergy status to other drugs, medicaments and biological substances status: Secondary | ICD-10-CM

## 2019-11-03 DIAGNOSIS — Z881 Allergy status to other antibiotic agents status: Secondary | ICD-10-CM

## 2019-11-03 DIAGNOSIS — I878 Other specified disorders of veins: Secondary | ICD-10-CM | POA: Diagnosis present

## 2019-11-03 DIAGNOSIS — I872 Venous insufficiency (chronic) (peripheral): Secondary | ICD-10-CM | POA: Diagnosis present

## 2019-11-03 DIAGNOSIS — Z8249 Family history of ischemic heart disease and other diseases of the circulatory system: Secondary | ICD-10-CM

## 2019-11-03 DIAGNOSIS — E861 Hypovolemia: Secondary | ICD-10-CM

## 2019-11-03 DIAGNOSIS — Z87442 Personal history of urinary calculi: Secondary | ICD-10-CM

## 2019-11-03 DIAGNOSIS — I959 Hypotension, unspecified: Secondary | ICD-10-CM | POA: Diagnosis present

## 2019-11-03 DIAGNOSIS — R5383 Other fatigue: Secondary | ICD-10-CM

## 2019-11-03 DIAGNOSIS — Z9861 Coronary angioplasty status: Secondary | ICD-10-CM

## 2019-11-03 DIAGNOSIS — E785 Hyperlipidemia, unspecified: Secondary | ICD-10-CM | POA: Diagnosis present

## 2019-11-03 DIAGNOSIS — Z7982 Long term (current) use of aspirin: Secondary | ICD-10-CM

## 2019-11-03 DIAGNOSIS — Z79899 Other long term (current) drug therapy: Secondary | ICD-10-CM

## 2019-11-03 DIAGNOSIS — Z7984 Long term (current) use of oral hypoglycemic drugs: Secondary | ICD-10-CM

## 2019-11-03 DIAGNOSIS — R001 Bradycardia, unspecified: Secondary | ICD-10-CM | POA: Diagnosis present

## 2019-11-03 DIAGNOSIS — G8929 Other chronic pain: Secondary | ICD-10-CM | POA: Diagnosis present

## 2019-11-03 DIAGNOSIS — K219 Gastro-esophageal reflux disease without esophagitis: Secondary | ICD-10-CM | POA: Diagnosis present

## 2019-11-03 DIAGNOSIS — Z91012 Allergy to eggs: Secondary | ICD-10-CM

## 2019-11-03 DIAGNOSIS — I739 Peripheral vascular disease, unspecified: Secondary | ICD-10-CM | POA: Diagnosis present

## 2019-11-03 LAB — CBG MONITORING, ED: Glucose-Capillary: 87 mg/dL (ref 70–99)

## 2019-11-03 LAB — CBC WITH DIFFERENTIAL/PLATELET
Abs Immature Granulocytes: 0.03 10*3/uL (ref 0.00–0.07)
Basophils Absolute: 0.1 10*3/uL (ref 0.0–0.1)
Basophils Relative: 1 %
Eosinophils Absolute: 0.2 10*3/uL (ref 0.0–0.5)
Eosinophils Relative: 3 %
HCT: 36.2 % (ref 36.0–46.0)
Hemoglobin: 11.4 g/dL — ABNORMAL LOW (ref 12.0–15.0)
Immature Granulocytes: 1 %
Lymphocytes Relative: 33 %
Lymphs Abs: 2 10*3/uL (ref 0.7–4.0)
MCH: 27.8 pg (ref 26.0–34.0)
MCHC: 31.5 g/dL (ref 30.0–36.0)
MCV: 88.3 fL (ref 80.0–100.0)
Monocytes Absolute: 0.7 10*3/uL (ref 0.1–1.0)
Monocytes Relative: 11 %
Neutro Abs: 3.1 10*3/uL (ref 1.7–7.7)
Neutrophils Relative %: 51 %
Platelets: 247 10*3/uL (ref 150–400)
RBC: 4.1 MIL/uL (ref 3.87–5.11)
RDW: 12.9 % (ref 11.5–15.5)
WBC: 6.2 10*3/uL (ref 4.0–10.5)
nRBC: 0 % (ref 0.0–0.2)

## 2019-11-03 LAB — COMPREHENSIVE METABOLIC PANEL
ALT: 13 U/L (ref 0–44)
AST: 17 U/L (ref 15–41)
Albumin: 2.5 g/dL — ABNORMAL LOW (ref 3.5–5.0)
Alkaline Phosphatase: 77 U/L (ref 38–126)
Anion gap: 6 (ref 5–15)
BUN: 10 mg/dL (ref 8–23)
CO2: 24 mmol/L (ref 22–32)
Calcium: 8.7 mg/dL — ABNORMAL LOW (ref 8.9–10.3)
Chloride: 108 mmol/L (ref 98–111)
Creatinine, Ser: 1.7 mg/dL — ABNORMAL HIGH (ref 0.44–1.00)
GFR calc Af Amer: 35 mL/min — ABNORMAL LOW (ref >60)
GFR calc non Af Amer: 30 mL/min — ABNORMAL LOW (ref >60)
Glucose, Bld: 160 mg/dL — ABNORMAL HIGH (ref 70–99)
Potassium: 3.5 mmol/L (ref 3.5–5.1)
Sodium: 138 mmol/L (ref 135–145)
Total Bilirubin: 0.6 mg/dL (ref 0.3–1.2)
Total Protein: 6.6 g/dL (ref 6.5–8.1)

## 2019-11-03 LAB — TROPONIN I (HIGH SENSITIVITY)
Troponin I (High Sensitivity): 20 ng/L — ABNORMAL HIGH (ref <18)
Troponin I (High Sensitivity): 9 ng/L (ref <18)

## 2019-11-03 LAB — HEMOGLOBIN A1C
Hgb A1c MFr Bld: 7.4 % — ABNORMAL HIGH (ref 4.8–5.6)
Mean Plasma Glucose: 165.68 mg/dL

## 2019-11-03 LAB — HIV ANTIBODY (ROUTINE TESTING W REFLEX): HIV Screen 4th Generation wRfx: NONREACTIVE

## 2019-11-03 LAB — LACTIC ACID, PLASMA: Lactic Acid, Venous: 1.5 mmol/L (ref 0.5–1.9)

## 2019-11-03 MED ORDER — SODIUM CHLORIDE 0.9 % IV BOLUS
500.0000 mL | Freq: Once | INTRAVENOUS | Status: AC
  Administered 2019-11-03: 500 mL via INTRAVENOUS

## 2019-11-03 MED ORDER — PANTOPRAZOLE SODIUM 40 MG PO TBEC
40.0000 mg | DELAYED_RELEASE_TABLET | Freq: Two times a day (BID) | ORAL | Status: DC
  Administered 2019-11-03 – 2019-11-06 (×6): 40 mg via ORAL
  Filled 2019-11-03 (×2): qty 1
  Filled 2019-11-03: qty 2
  Filled 2019-11-03 (×3): qty 1

## 2019-11-03 MED ORDER — INSULIN ASPART 100 UNIT/ML ~~LOC~~ SOLN
0.0000 [IU] | Freq: Three times a day (TID) | SUBCUTANEOUS | Status: DC
  Administered 2019-11-04 (×2): 2 [IU] via SUBCUTANEOUS

## 2019-11-03 MED ORDER — UMECLIDINIUM BROMIDE 62.5 MCG/INH IN AEPB
2.0000 | INHALATION_SPRAY | Freq: Every day | RESPIRATORY_TRACT | Status: DC
  Administered 2019-11-04 – 2019-11-06 (×3): 2 via RESPIRATORY_TRACT
  Filled 2019-11-03 (×2): qty 7

## 2019-11-03 MED ORDER — ACETAMINOPHEN 650 MG RE SUPP: 650.0000 mg | Freq: Four times a day (QID) | RECTAL | Status: DC | PRN

## 2019-11-03 MED ORDER — ONDANSETRON HCL 4 MG PO TABS: 4.0000 mg | ORAL_TABLET | Freq: Four times a day (QID) | ORAL | Status: DC | PRN

## 2019-11-03 MED ORDER — ACETAMINOPHEN 325 MG PO TABS
650.0000 mg | ORAL_TABLET | Freq: Four times a day (QID) | ORAL | Status: DC | PRN
  Administered 2019-11-03 – 2019-11-04 (×2): 650 mg via ORAL
  Filled 2019-11-03 (×2): qty 2

## 2019-11-03 MED ORDER — ASPIRIN EC 81 MG PO TBEC
81.0000 mg | DELAYED_RELEASE_TABLET | Freq: Every day | ORAL | Status: DC
  Administered 2019-11-03 – 2019-11-06 (×4): 81 mg via ORAL
  Filled 2019-11-03 (×4): qty 1

## 2019-11-03 MED ORDER — LACTATED RINGERS IV BOLUS
500.0000 mL | Freq: Once | INTRAVENOUS | Status: AC
  Administered 2019-11-03: 500 mL via INTRAVENOUS

## 2019-11-03 MED ORDER — ALBUTEROL SULFATE (2.5 MG/3ML) 0.083% IN NEBU: 2.5000 mg | INHALATION_SOLUTION | Freq: Four times a day (QID) | RESPIRATORY_TRACT | Status: DC | PRN

## 2019-11-03 MED ORDER — CLOPIDOGREL BISULFATE 75 MG PO TABS
75.0000 mg | ORAL_TABLET | Freq: Every day | ORAL | Status: DC
  Administered 2019-11-03 – 2019-11-06 (×4): 75 mg via ORAL
  Filled 2019-11-03 (×4): qty 1

## 2019-11-03 MED ORDER — OXYBUTYNIN CHLORIDE ER 15 MG PO TB24
15.0000 mg | ORAL_TABLET | Freq: Every day | ORAL | Status: DC
  Administered 2019-11-03 – 2019-11-06 (×4): 15 mg via ORAL
  Filled 2019-11-03 (×4): qty 1

## 2019-11-03 MED ORDER — MIRABEGRON ER 50 MG PO TB24
50.0000 mg | ORAL_TABLET | Freq: Every day | ORAL | Status: DC
  Administered 2019-11-03 – 2019-11-06 (×4): 50 mg via ORAL
  Filled 2019-11-03 (×4): qty 1

## 2019-11-03 MED ORDER — ATORVASTATIN CALCIUM 80 MG PO TABS
80.0000 mg | ORAL_TABLET | Freq: Every day | ORAL | Status: DC
  Administered 2019-11-03 – 2019-11-06 (×3): 80 mg via ORAL
  Filled 2019-11-03 (×3): qty 1

## 2019-11-03 MED ORDER — LORATADINE 10 MG PO TABS
10.0000 mg | ORAL_TABLET | Freq: Every day | ORAL | Status: DC
  Administered 2019-11-03 – 2019-11-06 (×4): 10 mg via ORAL
  Filled 2019-11-03 (×4): qty 1

## 2019-11-03 MED ORDER — ONDANSETRON HCL 4 MG/2ML IJ SOLN: 4.0000 mg | Freq: Four times a day (QID) | INTRAMUSCULAR | Status: DC | PRN

## 2019-11-03 MED ORDER — TOPIRAMATE 25 MG PO TABS
50.0000 mg | ORAL_TABLET | Freq: Every day | ORAL | Status: DC
  Administered 2019-11-03 – 2019-11-06 (×3): 50 mg via ORAL
  Filled 2019-11-03 (×3): qty 2

## 2019-11-03 MED ORDER — ENOXAPARIN SODIUM 80 MG/0.8ML ~~LOC~~ SOLN
70.0000 mg | SUBCUTANEOUS | Status: DC
  Administered 2019-11-03 – 2019-11-06 (×3): 70 mg via SUBCUTANEOUS
  Filled 2019-11-03: qty 0.8
  Filled 2019-11-03: qty 0.7
  Filled 2019-11-03: qty 0.8

## 2019-11-03 MED ORDER — GABAPENTIN 300 MG PO CAPS
300.0000 mg | ORAL_CAPSULE | Freq: Three times a day (TID) | ORAL | Status: DC
  Administered 2019-11-03 – 2019-11-06 (×9): 300 mg via ORAL
  Filled 2019-11-03 (×9): qty 1

## 2019-11-03 NOTE — ED Notes (Signed)
Valtracy Delly (Daughter#[phone]) called/Would like to come visit.  Thank you

## 2019-11-03 NOTE — ED Notes (Signed)
Patient states she cannot tolerate orthostatic VS at this time. Patient with pain to right groin.  MD paged regarding pain

## 2019-11-03 NOTE — ED Notes (Signed)
Tele   [phone] pts daughter Linus Orn, wants an update is emergency contact

## 2019-11-03 NOTE — ED Provider Notes (Addendum)
[facility] EMERGENCY DEPARTMENT Provider Note   CSN: [id number] Arrival date & time: 11/03/19  1300     History Chief Complaint  Patient presents with  . Fatigue  . Hypotension  . Leg Swelling    Alisha [name] is a 69 y.o. female.  HPI     69 yo female ho bpd, cad, presents with hypotension and fall.  Patient states that she became weak today and fell , then her automated wheel chair ran over her- she has pain in right hip and low back.  She proceeded to go to her primary care office for visit- traveling via medical transport.  Once there, she was noted to be hypotensive and sent to ED.  EMS noted that she was slow to respond, iV fluids and narcan given en route with questionable change in mental status.  EMS did not report any change, however patient is awake and speaking to me without difficulty here.  Patient has had increased diuretics with asymmetric swelling of lower extremities right > left- reported recent doppler for same-doppler 10/21/19- negative for dvt Patient stopped lasix and taking bumex  Past Medical History:  Diagnosis Date  . Arthritis   . Asthma   . Bipolar disorder ([place])   . CAD (coronary artery disease) 07/07/2016   a. s/p stenting x 2 in [place] to the LAD, RCA is chronically occluded, LVEDP was 34  . Cancer (HCC)    cervical  . Depression   . Fibromyalgia   . GERD (gastroesophageal reflux disease)   . History of benign pituitary tumor   . History of kidney stones    1972  . Hyperlipidemia   . Hypertension   . Migraine   . Morbid obesity ([place])   . Peripheral vascular disease ([place])   . Pneumonia   . Seizures ([place])    during surgery for pituitary tumor  . Sleep apnea    with cpap  . Vertigo     Patient Active Problem List   Diagnosis Date Noted  . Recurrent falls 09/22/2018  . Chronic use of opiate drug for therapeutic purpose 04/11/2018  . Bradycardia 04/11/2018  . Chronic fatigue 04/11/2018  . Prediabetes 12/17/2017    . Mild cognitive impairment 12/13/2017  . Cough variant asthma vs UACS 10/26/2017  . Upper airway cough syndrome 10/25/2017  . Medial epicondylitis, right 09/16/2017  . Right shoulder pain 09/07/2017  . Lateral epicondylitis of right elbow 09/07/2017  . Vitamin D deficiency 07/13/2017  . History of foot fracture 07/12/2017  . History of colon polyps 07/12/2017  . Cataract of both eyes 07/11/2017  . Hypertensive retinopathy of both eyes 07/11/2017  . Fibromyalgia 05/17/2017  . Tenosynovitis, wrist 05/14/2017  . Chronic cough 05/02/2017  . Foot pain, left 05/02/2017  . Dysphagia 03/23/2017  . Health care maintenance 03/23/2017  . Coronary artery disease involving native coronary artery of native heart without angina pectoris 01/11/2017  . Hyperlipidemia 01/11/2017  . Polypharmacy 11/25/2016  . Venous stasis of both lower extremities 10/04/2016  . Poor dentition 10/04/2016  . OSA (obstructive sleep apnea) 10/04/2016  . History of nephrolithiasis 10/04/2016  . Hx of pilonidal cyst 10/04/2016  . Bipolar depression ([place]) 10/04/2016  . Personal history of DVT (deep vein thrombosis) 10/04/2016  . Morbid obesity due to excess calories ([place]) 09/25/2016  . History of pituitary adenoma s/p resection 09/25/2016  . Essential hypertension 09/25/2016  . Psoriatic arthritis ([place]) 09/25/2016  . Vision impairment 09/25/2016  . Chronic seasonal allergic rhinitis  due to pollen 09/25/2016  . Moderate persistent asthma without complication [id number]  . Gastroesophageal reflux disease without esophagitis 09/25/2016  . Urge incontinence 09/25/2016  . Chronic pain syndrome 09/25/2016  . Candidal intertrigo 05/10/2010    Past Surgical History:  Procedure Laterality Date  . ANKLE FUSION Bilateral   . carpel tunnel release Bilateral 2017  . CORONARY ANGIOPLASTY    . ESOPHAGOGASTRODUODENOSCOPY (EGD) WITH PROPOFOL N/A 06/14/2017   Procedure: ESOPHAGOGASTRODUODENOSCOPY (EGD) WITH PROPOFOL;  Surgeon:  Wilford Corner, MD;  Location: [facility];  Service: Endoscopy;  Laterality: N/A;  . EXCISION / CURETTAGE BONE CYST PHALANGES OF FOOT  2014   Removal of foot cyst   . PARTIAL HYSTERECTOMY  unknown   Patient still has ovaries  . pituitary tumor removal  1999   removed as much as they could, on optic nerve  . TONSILLECTOMY    . TOTAL KNEE ARTHROPLASTY     TKR X 3  2 on the left and 1 on the right     OB History   No obstetric history on file.     Family History  Problem Relation Age of Onset  . Heart disease Mother 31       CABG, pacemaker, valve  . Cancer Father   . Heart disease Sister        Fluid around the heart  . Breast cancer Neg Hx     Social History   Tobacco Use  . Smoking status: Never Smoker  . Smokeless tobacco: Never Used  Substance Use Topics  . Alcohol use: Yes    Comment: Socially. [name].  . Drug use: No    Home Medications Prior to Admission medications   Medication Sig Start Date End Date Taking? Authorizing Provider  albuterol (PROVENTIL) (2.5 MG/3ML) 0.083% nebulizer solution Take 3 mLs (2.5 mg total) by nebulization every 6 (six) hours as needed for wheezing or shortness of breath. Patient not taking: Reported on 10/22/2019 11/10/16   Briscoe Deutscher, DO  albuterol (VENTOLIN HFA) 108 (90 Base) MCG/ACT inhaler Inhale 1 puff into the lungs every 6 (six) hours as needed for wheezing or shortness of breath.    [provider]  amitriptyline (ELAVIL) 75 MG tablet Take 75 mg by mouth at bedtime.    [provider]  atorvastatin (LIPITOR) 80 MG tablet Take 80 mg by mouth daily at 6 PM.    [provider]  azithromycin (ZITHROMAX) 250 MG tablet Take 1 tablet (250 mg total) by mouth daily. 10/22/19   Veryl Speak, MD  budesonide-formoterol (SYMBICORT) 80-4.5 MCG/ACT inhaler Inhale 2 puffs into the lungs 2 (two) times daily. Patient not taking: Reported on 10/22/2019 01/28/18   Lucious Groves, DO  clonazePAM (KLONOPIN) 0.5  MG tablet Take 0.5 mg by mouth 3 (three) times daily as needed for anxiety.    [provider]  clopidogrel (PLAVIX) 75 MG tablet Take 1 tablet (75 mg total) by mouth daily. 10/18/18   Pieter Partridge, DO  cyclobenzaprine (FLEXERIL) 5 MG tablet Take 1 tablet (5 mg total) by mouth 3 (three) times daily as needed for muscle spasms. Patient not taking: Reported on 10/22/2019 12/10/18   Drenda Freeze, MD  diclofenac (CATAFLAM) 50 MG tablet TAKE 1 TABLET BY MOUTH 3 TIMES A DAY Patient not taking: Reported on 10/22/2019 04/16/19   Pieter Partridge, DO  DULoxetine (CYMBALTA) 60 MG capsule Take 1 capsule (60 mg total) by mouth daily. Patient not taking: Reported on 10/22/2019 05/24/17 10/21/28  Lucious Groves, DO  flurbiprofen (ANSAID) 50 MG tablet Take 1 tablet every 8 hours as needed, maximum 3 tablets/24 hours Patient not taking: Reported on 10/22/2019 09/16/18   Pieter Partridge, DO  furosemide (LASIX) 40 MG tablet Take 1 tablet (40 mg total) by mouth daily. 10/22/19   Veryl Speak, MD  gabapentin (NEURONTIN) 100 MG capsule TAKE 1 CAPSULE BY MOUTH 3 TIMES DAILY Patient not taking: No sig reported 11/11/18   Tanda Rockers, MD  gabapentin (NEURONTIN) 300 MG capsule Take 300 mg by mouth 3 (three) times daily. 10/07/19   [provider]  losartan (COZAAR) 100 MG tablet Take 1 tablet (100 mg total) daily by mouth. 07/06/17   Lucious Groves, DO  metFORMIN (GLUCOPHAGE) 500 MG tablet Take 500 mg by mouth 2 (two) times daily with a meal.    [provider]  mirabegron ER (MYRBETRIQ) 50 MG TB24 tablet Take 50 mg by mouth daily.    [provider]  morphine (MS CONTIN) 15 MG 12 hr tablet Take 1 tablet (15 mg total) by mouth daily as needed for pain. Patient not taking: Reported on 10/22/2019 04/19/18   Lorella Nimrod, MD  oxybutynin (DITROPAN XL) 15 MG 24 hr tablet Take 15 mg by mouth daily. 11/19/18   [provider]  pantoprazole (PROTONIX) 40 MG tablet Take 40 mg by mouth 2 (two)  times daily. 10/07/19   [provider]  predniSONE (DELTASONE) 20 MG tablet Take 60 mg daily x 2 days then 40 mg daily x 2 days then 20 mg daily x 2 days Patient not taking: Reported on 10/22/2019 12/10/18   Drenda Freeze, MD  SPIRIVA RESPIMAT 1.25 MCG/ACT AERS Inhale 2 puffs into the lungs daily.  10/07/19   [provider]  tiZANidine (ZANAFLEX) 4 MG tablet TAKE 1&1/2 TABLETS DAILY Patient taking differently: Take 6 mg by mouth daily as needed for muscle spasms.  09/02/19   Tomi Likens, Adam R, DO  topiramate (TOPAMAX) 50 MG tablet Take 50 mg by mouth at bedtime.    [provider]    Allergies    Ibuprofen, Ampicillin, Asa [aspirin], Darvon [propoxyphene], Demeclocycline, Eggs or egg-derived products, Lactalbumin, Lisinopril, Milk-related compounds, Salicylates, Tetracycline hcl, and Tetracyclines & related  Review of Systems   Review of Systems  Physical Exam Updated Vital Signs There were no vitals taken for this visit.  Physical Exam Vitals and nursing note reviewed.  Constitutional:      Appearance: She is obese.  HENT:     Head: Normocephalic.     Right Ear: External ear normal.     Left Ear: External ear normal.     Nose: Nose normal.     Mouth/Throat:     Mouth: Mucous membranes are dry.  Eyes:     Extraocular Movements: Extraocular movements intact.     Pupils: Pupils are equal, round, and reactive to light.  Cardiovascular:     Rate and Rhythm: Normal rate and regular rhythm.  Pulmonary:     Effort: Pulmonary effort is normal.     Breath sounds: Normal breath sounds.  Abdominal:     General: There is distension.     Palpations: Abdomen is soft.  Musculoskeletal:     Cervical back: Normal range of motion.     Comments: Some ttp with external rotation of right hip, no shortening or external signs of trauma- rle larger than left with induration but only mild pitting edema Diffuse ttp right posterior pelvis  and diffusely through ls   Skin:     Capillary Refill: Capillary refill takes less than 2 seconds.  Neurological:     General: No focal deficit present.     Mental Status: She is alert.  Psychiatric:        Mood and Affect: Mood normal.     ED Results / Procedures / Treatments   Labs (all labs ordered are listed, but only abnormal results are displayed) Labs Reviewed - No data to display  EKG EKG Interpretation  Date/Time:  Monday November 03 2019 13:11:24 EST Ventricular Rate:  52 PR Interval:    QRS Duration: 101 QT Interval:  501 QTC Calculation: 466 R Axis:   -12 Text Interpretation: Sinus rhythm slower rate, otherwise no significant change Confirmed by Merrily Pew [id number]) on 11/03/2019 3:22:59 PM   Radiology DG Chest 1 View  Result Date: 11/03/2019 CLINICAL DATA:  Pain after fall. Right hip injury previously but no surgery EXAM: CHEST  1 VIEW COMPARISON:  Chest radiograph 10/21/2019 FINDINGS: Stable cardiomediastinal contours. Low lung volumes. Bronchovascular crowding. No focal consolidation. No pneumothorax or large pleural effusion. No acute finding in the visualized skeleton. IMPRESSION: Low volume study.  No acute finding. Electronically Signed   By: Audie Pinto M.D.   On: 11/03/2019 14:36   CT Lumbar Spine Wo Contrast  Result Date: 11/03/2019 CLINICAL DATA:  Low back pain.  Trauma. EXAM: CT LUMBAR SPINE WITHOUT CONTRAST TECHNIQUE: Multidetector CT imaging of the lumbar spine was performed without intravenous contrast administration. Multiplanar CT image reconstructions were also generated. COMPARISON:  Abdominopelvic CT 08/19/2019.  Lumbar MRI 07/30/2019. FINDINGS: Segmentation: There are 5 lumbar type vertebral bodies. Alignment: Stable minimal degenerative anterolisthesis at L3-4 and L4-5. Vertebrae: No evidence of acute fracture or traumatic subluxation. Stable chronic endplate sclerosis anteriorly at L4-5. There is advanced multilevel lumbar facet arthropathy. Advanced degenerative changes are present  at the sacroiliac joints with subchondral sclerosis and vacuum phenomenon. No evidence of acute fracture or pars defect. Paraspinal and other soft tissues: No acute paraspinal findings. Aortic and branch vessel atherosclerosis, nonobstructing right renal calculus, a cyst in the lower pole of the right kidney and hepatic steatosis are noted. Disc levels: T11-12: Mild vacuum phenomenon and bulging. No significant spinal stenosis. T12-L1: No significant findings. L1-2: The disc appears normal. Mild anterior osteophyte formation and facet hypertrophy. No spinal stenosis. L2-3: Mild spinal stenosis secondary to disc bulging, facet and ligamentous hypertrophy, similar to previous MRI. Mild narrowing of both foramina and the right lateral recess. L3-4: Vacuum disc phenomenon with annular disc bulging and endplate osteophytes. Advanced facet and ligamentous hypertrophy. There is resulting moderate multifactorial spinal stenosis with mild narrowing of the lateral recesses and foramina. L4-5: Chronic loss of disc height with annular disc bulging, endplate osteophytes and sclerosis. Advanced facet and ligamentous hypertrophy. Grossly stable moderate multifactorial spinal stenosis with moderate narrowing of the lateral recesses and foramina bilaterally. L5-S1: The disc appears normal. Bilateral facet hypertrophy with asymmetric paraspinal osteophytes on the right. No significant spinal stenosis. IMPRESSION: 1. No acute posttraumatic findings within the lumbar spine. 2. Multilevel spondylosis with disc degeneration, endplate osteophytes and facet hypertrophy as described. Moderate multifactorial spinal stenosis at L3-4 and L4-5, similar to previous imaging. Electronically Signed   By: Richardean Sale M.D.   On: 11/03/2019 14:55   DG Hip Unilat W or Wo Pelvis 2-3 Views Right  Result Date: 11/03/2019 CLINICAL DATA:  Pain after fall. Right hip injury previously but no surgery EXAM: DG HIP (WITH  OR WITHOUT PELVIS) 2-3V RIGHT  COMPARISON:  CT abdomen pelvis 08/19/2019; pelvis radiographs 09/19/2018 FINDINGS: There is no evidence of hip fracture or dislocation. There is no evidence of arthropathy or other focal bone abnormality. IMPRESSION: Negative radiographs of the right hip. Electronically Signed   By: Audie Pinto M.D.   On: 11/03/2019 14:38    Procedures .Critical Care Performed by: Pattricia Boss, MD Authorized by: Pattricia Boss, MD   Critical care provider statement:    Critical care time (minutes):  45   Critical care end time:  11/03/2019 3:47 PM   Critical care was time spent personally by me on the following activities:  Discussions with consultants, evaluation of patient's response to treatment, examination of patient, ordering and performing treatments and interventions, ordering and review of laboratory studies, ordering and review of radiographic studies, pulse oximetry, re-evaluation of patient's condition, obtaining history from patient or surrogate and review of old charts   (including critical care time)  Medications Ordered in ED Medications - No data to display  ED Course  I have reviewed the triage vital signs and the nursing notes.  Pertinent labs & imaging results that were available during my care of the patient were reviewed by me and considered in my medical decision making (see chart for details).  69 year old female presents today with fall, hypotension after recent increase in diuretics.  Patient also received narcan en route- unclear if any change from this.  X-rays obtained of pelvis/hip/ls spine- no evidence of fracture.  Bilateral legs swollen but no evidence of chf and suspect more chronic changes. Plan iv fluids and consult to IM for admission. Discussed with Dr. Dayna Barker- he assumes care and will facilitate admission    MDM Rules/Calculators/A&P                     Final Clinical Impression(s) / ED Diagnoses Final diagnoses:  Intravascular volume depletion  Weakness    AKI (acute kidney injury) ([place])  Hypotension, unspecified hypotension type    Rx / DC Orders ED Discharge Orders    None       Pattricia Boss, MD 11/03/19 1547    Pattricia Boss, MD 11/03/19 [phone]

## 2019-11-03 NOTE — H&P (Signed)
Date: 11/03/2019               Patient Name:  Alisha [name] MRN: [id number]  DOB: 1951/05/18 Age / Sex: 69 y.o., female   PCP: Sonia Side., FNP         Medical Service: Internal Medicine Teaching Service         Attending Physician: Dr. Lucious Groves, DO    First Contact: Dr. Ronnald Ramp Pager: [phone]  Second Contact: Dr. Sharon Seller Pager: [phone]       After Hours (After 5p/  First Contact Pager: [phone]  weekends / holidays): Second Contact Pager: [phone]   Chief Complaint: Fall  History of Present Illness: Alisha [name] is a 69 y.o female with Obesity, HTN, Asthma, Chronic venous insufficiency, Depression, and Polypharmacy who presented to the ED after a fall. History was primarily obtained via the patient; however, was limited due to the patient's memory around the events.  Patient states she was getting ready to go to the doctors office. She got out of her wheelchair to get dressed she then feel to the floor. States that she stood up and her right leg went out. She does not remember being dizzy, palpitations, changes in vision, or other symptoms around the event. She remembers stumbling. The fall happened so fast she cannot recall all the details. She did not hit her head and she is unsure if she experienced LOC. Denies incontinence or tongue biting during the episode. The next thing she remembers is waking up on the ground with her wheelchair on top of her. She was able to get up but does not remember how. She was able to get downstairs to the transportation [place] and was transported to the doctors office. The  next thing she remembers is being in the ED talking with [place].  She did receive the COVID vaccine ~1 week ago. Otherwise no medication changes. She has been taking her diuretics as prescribed. She has had decreased PO intake due to a decreased appetite. She has been having 2 loose stools p[er day. She is constantly thirsty.   ROS is + for fevers/chills, chapped lips,  sore throat, rhinorrhea, productive cough (yellow/green), chronic SHOB, fatigue, chest pain 1 week ago (describes it as indigestion), polyuria, loosing track of time, increased daytime sleepiness. ROS is - for sick contact, bloody stools.  Meds:  Current Meds  Medication Sig  . albuterol (VENTOLIN HFA) 108 (90 Base) MCG/ACT inhaler Inhale 1 puff into the lungs every 6 (six) hours as needed for wheezing or shortness of breath.  Marland Kitchen amitriptyline (ELAVIL) 75 MG tablet Take 75 mg by mouth at bedtime.  Marland Kitchen aspirin EC 81 MG tablet Take 81 mg by mouth daily.  Marland Kitchen atorvastatin (LIPITOR) 80 MG tablet Take 80 mg by mouth at bedtime.   . clopidogrel (PLAVIX) 75 MG tablet Take 1 tablet (75 mg total) by mouth daily.  . furosemide (LASIX) 40 MG tablet Take 1 tablet (40 mg total) by mouth daily.  Marland Kitchen gabapentin (NEURONTIN) 300 MG capsule Take 300 mg by mouth 3 (three) times daily.  Marland Kitchen loratadine (CLARITIN) 10 MG tablet Take 10 mg by mouth daily.  . metFORMIN (GLUCOPHAGE) 500 MG tablet Take 500 mg by mouth 2 (two) times daily with a meal.  . mirabegron ER (MYRBETRIQ) 50 MG TB24 tablet Take 50 mg by mouth daily.  Marland Kitchen oxybutynin (DITROPAN XL) 15 MG 24 hr tablet Take 15 mg by mouth daily.  . pantoprazole (PROTONIX) 40 MG  tablet Take 40 mg by mouth 2 (two) times daily.  Marland Kitchen SPIRIVA RESPIMAT 1.25 MCG/ACT AERS Inhale 2 puffs into the lungs daily.   Marland Kitchen tiZANidine (ZANAFLEX) 4 MG tablet TAKE 1&1/2 TABLETS DAILY (Patient taking differently: Take 6 mg by mouth daily as needed for muscle spasms. )  . topiramate (TOPAMAX) 50 MG tablet Take 50 mg by mouth at bedtime.  . torsemide (DEMADEX) 20 MG tablet Take 40 mg by mouth daily.   Allergies: Allergies as of 11/03/2019 - Review Complete 11/03/2019  Allergen Reaction Noted  . Ibuprofen  08/15/2012  . Ampicillin Nausea Only 09/25/2016  . Asa [aspirin] Diarrhea 04/12/2004  . Darvon [propoxyphene] Nausea And Vomiting 04/12/2004  . Demeclocycline Other (See Comments) 09/25/2016  .  Eggs or egg-derived products Diarrhea 02/19/2017  . Lactalbumin Diarrhea 02/19/2017  . Lisinopril Cough 05/04/2017  . Milk-related compounds Diarrhea 02/19/2017  . Salicylates Other (See Comments) 09/25/2016  . Tetracycline hcl Hives and Nausea Only 04/12/2004  . Tetracyclines & related Other (See Comments) 09/25/2016   Past Medical History:  Diagnosis Date  . Arthritis   . Asthma   . Bipolar disorder ([place])   . CAD (coronary artery disease) 07/07/2016   a. s/p stenting x 2 in [place] to the LAD, RCA is chronically occluded, LVEDP was 34  . Cancer (HCC)    cervical  . Depression   . Fibromyalgia   . GERD (gastroesophageal reflux disease)   . History of benign pituitary tumor   . History of kidney stones    1972  . Hyperlipidemia   . Hypertension   . Migraine   . Morbid obesity ([place])   . Peripheral vascular disease ([place])   . Pneumonia   . Seizures ([place])    during surgery for pituitary tumor  . Sleep apnea    with cpap  . Vertigo    Family History  Problem Relation Age of Onset  . Heart disease Mother 35       CABG, pacemaker, valve  . Cancer Father   . Heart disease Sister        Fluid around the heart  . Breast cancer Neg Hx    Social History   Socioeconomic History  . Marital status: Divorced    Spouse name: Not on file  . Number of children: Not on file  . Years of education: Not on file  . Highest education level: Not on file  Occupational History  . Not on file  Tobacco Use  . Smoking status: Never Smoker  . Smokeless tobacco: Never Used  Substance and Sexual Activity  . Alcohol use: Yes    Comment: Socially. [name].  . Drug use: No  . Sexual activity: Not Currently  Other Topics Concern  . Not on file  Social History Narrative   Current Social History        Who lives at home: Lives with youngest daughter, "Olivia Mackie" 09/25/2016    Transportation: Public [id number]   Important Relationships & Pets: 2 daughters (52, 11), 1 son (55), 10  grandchildren, 4 great grandchildren 09/25/2016    Current Stressors: Recently moved from [place], [place] due to threat of NH placement 09/25/2016   Religious / Personal Beliefs: Christian 09/25/2016   Interests / Fun: Dancing 09/25/2016   Other: Participates in [employer] 09/25/2016   Social Determinants of Health   Financial Resource Strain:   . Difficulty of Paying Living Expenses: Not on file  Food Insecurity:   . Worried About [employer] of  Food in the Last Year: Not on file  . Ran Out of Food in the Last Year: Not on file  Transportation Needs:   . Lack of Transportation (Medical): Not on file  . Lack of Transportation (Non-Medical): Not on file  Physical Activity:   . Days of Exercise per Week: Not on file  . Minutes of Exercise per Session: Not on file  Stress:   . Feeling of Stress : Not on file  Social Connections:   . Frequency of Communication with Friends and Family: Not on file  . Frequency of Social Gatherings with Friends and Family: Not on file  . Attends Religious Services: Not on file  . Active Member of Clubs or Organizations: Not on file  . Attends Archivist Meetings: Not on file  . Marital Status: Not on file  Intimate Partner Violence:   . Fear of Current or Ex-Partner: Not on file  . Emotionally Abused: Not on file  . Physically Abused: Not on file  . Sexually Abused: Not on file   Review of Systems: A complete ROS was negative except as per HPI.   Physical Exam: Blood pressure 134/90, pulse (!) 52, temperature 97.6 F (36.4 C), temperature source Oral, resp. rate 20, SpO2 100 %. Physical Exam  Constitutional: She is oriented to person, place, and time and well-developed, well-nourished, and in no distress. No distress.  HENT:  Head: Normocephalic and atraumatic.  Eyes: Pupils are equal, round, and reactive to light.  Cardiovascular: Normal rate, regular rhythm and normal heart sounds.  No murmur heard. Distant heart sounds; bilateral LE  edema, R > L  Pulmonary/Chest: Effort normal. No respiratory distress. She has wheezes.  Abdominal: Soft. Bowel sounds are normal. She exhibits no distension. There is abdominal tenderness.  Musculoskeletal:        General: Edema (bilateral LE edema, R > L) present. No tenderness or deformity. Normal range of motion.     Cervical back: Normal range of motion and neck supple.  Neurological: She is alert and oriented to person, place, and time.  Skin: Skin is warm and dry. She is not diaphoretic. No erythema.  Psychiatric: Affect normal.   EKG: personally reviewed my interpretation is sinus brady  CXR: personally reviewed my interpretation is no acute cardiopulmonary abnormality    DG Chest 1 View  Result Date: 11/03/2019 CLINICAL DATA:  Pain after fall. Right hip injury previously but no surgery EXAM: CHEST  1 VIEW COMPARISON:  Chest radiograph 10/21/2019 FINDINGS: Stable cardiomediastinal contours. Low lung volumes. Bronchovascular crowding. No focal consolidation. No pneumothorax or large pleural effusion. No acute finding in the visualized skeleton. IMPRESSION: Low volume study.  No acute finding. Electronically Signed   By: Audie Pinto M.D.   On: 11/03/2019 14:36   CT Lumbar Spine Wo Contrast  Result Date: 11/03/2019 CLINICAL DATA:  Low back pain.  Trauma. EXAM: CT LUMBAR SPINE WITHOUT CONTRAST TECHNIQUE: Multidetector CT imaging of the lumbar spine was performed without intravenous contrast administration. Multiplanar CT image reconstructions were also generated. COMPARISON:  Abdominopelvic CT 08/19/2019.  Lumbar MRI 07/30/2019. FINDINGS: Segmentation: There are 5 lumbar type vertebral bodies. Alignment: Stable minimal degenerative anterolisthesis at L3-4 and L4-5. Vertebrae: No evidence of acute fracture or traumatic subluxation. Stable chronic endplate sclerosis anteriorly at L4-5. There is advanced multilevel lumbar facet arthropathy. Advanced degenerative changes are present at the  sacroiliac joints with subchondral sclerosis and vacuum phenomenon. No evidence of acute fracture or pars defect. Paraspinal and other soft tissues:  No acute paraspinal findings. Aortic and branch vessel atherosclerosis, nonobstructing right renal calculus, a cyst in the lower pole of the right kidney and hepatic steatosis are noted. Disc levels: T11-12: Mild vacuum phenomenon and bulging. No significant spinal stenosis. T12-L1: No significant findings. L1-2: The disc appears normal. Mild anterior osteophyte formation and facet hypertrophy. No spinal stenosis. L2-3: Mild spinal stenosis secondary to disc bulging, facet and ligamentous hypertrophy, similar to previous MRI. Mild narrowing of both foramina and the right lateral recess. L3-4: Vacuum disc phenomenon with annular disc bulging and endplate osteophytes. Advanced facet and ligamentous hypertrophy. There is resulting moderate multifactorial spinal stenosis with mild narrowing of the lateral recesses and foramina. L4-5: Chronic loss of disc height with annular disc bulging, endplate osteophytes and sclerosis. Advanced facet and ligamentous hypertrophy. Grossly stable moderate multifactorial spinal stenosis with moderate narrowing of the lateral recesses and foramina bilaterally. L5-S1: The disc appears normal. Bilateral facet hypertrophy with asymmetric paraspinal osteophytes on the right. No significant spinal stenosis. IMPRESSION: 1. No acute posttraumatic findings within the lumbar spine. 2. Multilevel spondylosis with disc degeneration, endplate osteophytes and facet hypertrophy as described. Moderate multifactorial spinal stenosis at L3-4 and L4-5, similar to previous imaging. Electronically Signed   By: Richardean Sale M.D.   On: 11/03/2019 14:55   DG Hip Unilat W or Wo Pelvis 2-3 Views Right  Result Date: 11/03/2019 CLINICAL DATA:  Pain after fall. Right hip injury previously but no surgery EXAM: DG HIP (WITH OR WITHOUT PELVIS) 2-3V RIGHT COMPARISON:   CT abdomen pelvis 08/19/2019; pelvis radiographs 09/19/2018 FINDINGS: There is no evidence of hip fracture or dislocation. There is no evidence of arthropathy or other focal bone abnormality. IMPRESSION: Negative radiographs of the right hip. Electronically Signed   By: Audie Pinto M.D.   On: 11/03/2019 14:38    Assessment & Plan by Problem:  Ms. Beauchene is a 69 yo F w/ a PMHx significant for CAD, HTN, HLD,  asthma, OSA, morbid obesity, GERD, chronic pain/fibromyalgia, venous stasis of the BLE, bipolar depression, hx of recurrent falls and polypharmacy who presents with a mechanical fall in setting of hypotension and increased bilateral lower extremity swelling.  Fall: -pt with hx of recurrent falls presenting after a fall from her wheelchair; she was trying to stand up out of her chair when her R leg gave out  -patient denies presyncopal sx; she denies dizziness, lightheadedness, room spinning, palpitations; trops and EKG unremarkable -she remembers falling but doesn't remember some of the aftermath of the fall -she denies hitting her head, however, the fall was unwitnessed -she denies incontinence of urine and feces  -she has had recent increase in swelling in her LEs and was placed on a higher diuretic dose -the R leg has been more swollen than the L and she has been having more problems with it; she had a doppler to check for DVT in the ED 2 weeks ago which was negative -this fall is likely multifactorial and related to deconditioning and need for PT in addition to hypotension and polypharmacy  Plan: -CT Head -telemetry -PT/OT -medication deescalating as below  Hypotension: -pt hypotensive per EMS and on arrival to the ED with BP of 73/45 in setting of fall -pt describes fall as mechanical, states her R leg gave out -has hx of hypertension and polypharmacy and is on numerous medications that can lower her blood pressure including lasix, tizanidine, torsemide, losartan and morphine  to name a few -BP responsive to fluids and most recent BP  103/67 -this is most likely related to her polypharmacy including recent increase in diuretic dose  -infectious causes less likely given no acute infectious sx (some allergic and chronic sx in setting of chronic allergic rhinitis and cough), no documented fever, no WBC; lactic acid normal, chest x-ray unremarkable; UA pending  Plan: -orthostatics -hold medications which can precipitate hypotension -may need to permanently dc some of them -fu UA and trend fever curve and WBC to monitor for infection   Bradycardia: -pt with hx of bradycardia presenting with HRs of 49-62 in setting of mechanical fall -pt denies lightheadedness and reports her fall was secondary to her R leg giving out -not on any beta blockers but is on amitriptyline and oxycodone which can cause bradycardia; patient reports taking oxycodone this morning but received narcan by EMS -unclear etiology but likely related to polypharmacy  Plan: -telemetry -EKG if symptomatic -hold medications that can precipitate bradycardia -may need to permanently dc some of them  Unilateral edema of the R leg: Venous stasis of the BLE: Personal hx of DVT: -pt w/ hx of DVT and venous stasis of the BLE continues to have bilateral lower extremity edema, R > L, for which she was placed on a higher diuretic dose with no apparent improvement -s/p doppler to check for DVT in the ED 2 weeks ago which was negative -pt is on numerous medications, some of which can contribute to LE edema -imaging of her R hip unremarkable without obvious cause for unilateral swelling  -this is also likely multifactorial related to venous stasis and possibly polypharmacy as many of her medications have reported side effect of edema including gabapentin, pantoprazole, diclofenac, ansaid and losartan  Plan: -hold diuretics in setting of hypotension and fall -consider discontinuing some medications due to high  risk of side effects from polypharmacy  AKI: -Creatinine 1.7 up from baseline of 0.9; of note it was up to 1.33 2 weeks ago -this is in setting of taking increased diuretics for her LE edema and hypotension on presentation -she does report a decrease in her appetite as well -she reports decreased urination today, however -no dysuria reported -pt on multiple medications for overactive bladder  Plan: -IV fluids -hold BP meds/diuretics -fu UA -urine studies  Normocytic Anemia: -Hgb 11.4 today with baseline Hgb around 13-14 -pt denies bleeding including hematochezia -unlikely dilutional as patient has signs and sx of dehydration -could be iron deficiency vs anemia of chronic disease   Plan: -monitor Hgb w/ daily CBC -may consider iron studies  CAD: HLD: -continue aspirin, clopidigrel, atorvastatin   Asthma: Upper airway cough syndrome: -continue home albuterol and spiriva  GERD: -continue home protonix 40 mg  Overactive Bladder: -continue oxybutynin   Dispo: Admit patient to Inpatient with expected length of stay greater than 2 midnights.  Signed: Al Decant, MD 11/03/2019, 9:33 PM

## 2019-11-03 NOTE — ED Notes (Signed)
Patient given bag meal with drink. 

## 2019-11-03 NOTE — Progress Notes (Signed)
Called the patient's daughter to discuss why she was admitted and plans moving forward. We discussed the patient's hypotension, AKI, and the fall. The patient's daughter is more concerned with the LE edema. She states that the patient no longer follows with the [facility], instead she follows with [facility] Dustin Folks FNP) and he set her to the ED for a more thorough evaluation of her LE edema. They are concerned that the edema limits her ability to ambulate.   We discussed that we think the patient was hypotensive due to intravascular volume depletion and we need to hold her diuretics. Will discuss with the day team about reaching out to her primary for additional information.   PCP: Dustin Folks [place] [phone]   Ina Homes, MD  [facility] PGY3

## 2019-11-03 NOTE — ED Triage Notes (Signed)
Pt in via [facility] w/lethargy, hypotension, having worsened BLE swelling (worse on R), also had fall today. Seen on 2/23 in ED for BLE, reports she was supposed to be admitted, but was not. States Dr. Tamala Julian placed her on Lasix and Torsemide recently. States she was heading to her doc's office today via [place] transport, and fell out of it, w/the WC running over her back. Denies any LOC, is c/o low back tenderness. Given 0.5 of Narcan en route as well. Last BP 70's/40's. Arrives slow to respond, but a&ox4

## 2019-11-04 DIAGNOSIS — N179 Acute kidney failure, unspecified: Secondary | ICD-10-CM

## 2019-11-04 LAB — SODIUM, URINE, RANDOM: Sodium, Ur: 19 mmol/L

## 2019-11-04 LAB — URINALYSIS, ROUTINE W REFLEX MICROSCOPIC
Bilirubin Urine: NEGATIVE
Glucose, UA: NEGATIVE mg/dL
Hgb urine dipstick: NEGATIVE
Ketones, ur: NEGATIVE mg/dL
Nitrite: NEGATIVE
Protein, ur: 30 mg/dL — AB
Specific Gravity, Urine: 1.017 (ref 1.005–1.030)
pH: 5 (ref 5.0–8.0)

## 2019-11-04 LAB — SARS CORONAVIRUS 2 (TAT 6-24 HRS): SARS Coronavirus 2: NEGATIVE

## 2019-11-04 LAB — GLUCOSE, CAPILLARY
Glucose-Capillary: 123 mg/dL — ABNORMAL HIGH (ref 70–99)
Glucose-Capillary: 88 mg/dL (ref 70–99)
Glucose-Capillary: 134 mg/dL — ABNORMAL HIGH (ref 70–99)
Glucose-Capillary: 113 mg/dL — ABNORMAL HIGH (ref 70–99)

## 2019-11-04 LAB — BASIC METABOLIC PANEL
Anion gap: 11 (ref 5–15)
BUN: 9 mg/dL (ref 8–23)
CO2: 26 mmol/L (ref 22–32)
Calcium: 9.2 mg/dL (ref 8.9–10.3)
Chloride: 105 mmol/L (ref 98–111)
Creatinine, Ser: 1.32 mg/dL — ABNORMAL HIGH (ref 0.44–1.00)
GFR calc Af Amer: 47 mL/min — ABNORMAL LOW (ref >60)
GFR calc non Af Amer: 41 mL/min — ABNORMAL LOW (ref >60)
Glucose, Bld: 136 mg/dL — ABNORMAL HIGH (ref 70–99)
Potassium: 4.3 mmol/L (ref 3.5–5.1)
Sodium: 142 mmol/L (ref 135–145)

## 2019-11-04 LAB — CREATININE, URINE, RANDOM: Creatinine, Urine: 384.03 mg/dL

## 2019-11-04 NOTE — Plan of Care (Signed)
  Problem: Activity: Goal: Risk for activity intolerance will decrease Outcome: Progressing   Problem: Elimination: Goal: Will not experience complications related to bowel motility Outcome: Progressing Goal: Will not experience complications related to urinary retention Outcome: Progressing   Problem: Safety: Goal: Ability to remain free from injury will improve Outcome: Progressing   

## 2019-11-04 NOTE — Evaluation (Signed)
Physical Therapy Evaluation Patient Details Name: Alisha [name] MRN: [id number] DOB: 1951-03-31 Today's Date: 11/04/2019   History of Present Illness  69 y.o female with Obesity, HTN, Asthma, Chronic venous insufficiency, Depression, and Polypharmacy who presented to the ED after a fall.  Clinical Impression   Pt admitted with above diagnosis. PTA was living home alone but states has daughters to check in on her. States was independent at walker (FWW) level, had "lift chair" she used, slept in chair. But lift chair has been damaged recently, grandson may be able to put in a new motor. Pt reports having multi falls at home (as many as 7 last July), she c/o pain in anterior proximal RLE, states has hx of back pain and also sciatica pain. Pt reports one daughter will stay with her at d/c. Pt currently with functional limitations due to the deficits listed below (see PT Problem List). Reports pain in RLE more so in groin area, pt reports decreased pain with RLE elevation. Was able to get to edge of bed and sit supported for some time, needed mod a for supine to sit and back to supine as states unable to move RLE on her own. Pt also able to stand at edge of bed and take small side steps to move up in bed. Did not attempt further ambulation sec to c/o pain and safety concerns of leg giving out. Pt will benefit from skilled PT to increase her overall strength, balance and coordination, activity tolerance, independence and safety with mobility, also to decrease pain in RLE to allow discharge to the venue listed below. Therapist recommends post acute care rehab at d/c, pt has hx of multi falls and also lives alone. Therapist attempted to discuss this with pt but she reports she has been to SNFs in past and does not wish to go back. She sounds as if would greatly benefit from continued therapy to address pain in back and debility into RLE.      Follow Up Recommendations SNF;Other (comment)(hx multi falls and  debility, would benefit from rehab)    Equipment Recommendations  None recommended by PT    Recommendations for Other Services       Precautions / Restrictions Precautions Precautions: Fall Precaution Comments: RLE pain and instability Restrictions Weight Bearing Restrictions: No      Mobility  Bed Mobility Overal bed mobility: Needs Assistance Bed Mobility: Supine to Sit;Sit to Supine Rolling: Mod assist(moving RLE)   Supine to sit: HOB elevated;Mod assist Sit to supine: Mod assist   General bed mobility comments: requires assist to mobilize R LE   Transfers Overall transfer level: Needs assistance Equipment used: Rolling walker (2 wheeled) Transfers: Sit to/from Stand Sit to Stand: Min assist;Mod assist;From elevated surface         General transfer comment: requires assist to power up to standing, labored breathing with activity, unable to obtain O2 reading   Ambulation/Gait             General Gait Details: did not attempt ambulation yet sec to safety concerns  Stairs            Wheelchair Mobility    Modified Rankin (Stroke Patients Only)       Balance Overall balance assessment: Needs assistance;History of Falls Sitting-balance support: Feet supported;No upper extremity supported Sitting balance-Leahy Scale: Good Sitting balance - Comments: sits unsupported edge of bed   Standing balance support: Bilateral upper extremity supported;During functional activity Standing balance-Leahy Scale: Poor Standing balance comment: reliant  on B UE support                             Pertinent Vitals/Pain Pain Assessment: Faces Faces Pain Scale: Hurts even more Pain Location: R LE Pain Descriptors / Indicators: Guarding;Grimacing;Aching;Radiating Pain Intervention(s): Monitored during session    Home Living Family/patient expects to be discharged to:: Private residence Living Arrangements: Alone Available Help at Discharge:  Family Type of Home: Apartment Home Access: Elevator     Home Layout: One level Home Equipment: Environmental consultant - 2 wheels;Wheelchair - Education administrator (comment);Shower seat - built in;Grab bars - toilet;Grab bars - tub/shower Additional Comments: sleeps in a recliner    Prior Function Level of Independence: Independent with assistive device(s)               Hand Dominance   Dominant Hand: Right    Extremity/Trunk Assessment   Upper Extremity Assessment Upper Extremity Assessment: Overall WFL for tasks assessed    Lower Extremity Assessment Lower Extremity Assessment: Defer to PT evaluation       Communication   Communication: No difficulties  Cognition Arousal/Alertness: Awake/alert Behavior During Therapy: WFL for tasks assessed/performed Overall Cognitive Status: No family/caregiver present to determine baseline cognitive functioning Area of Impairment: Memory                     Memory: Decreased short-term memory         General Comments: tangential      General Comments General comments (skin integrity, edema, etc.): room air throughout session, unable to obtain accurate O2 level (pt wearing nail polish), HR maintained in 90s during activity    Exercises Other Exercises Other Exercises: completed some passive stretching to RLE in supine, pt reported feeling better with hamstring stretch, at end of session elevated RLE on pillows and pt reported this felt better, breathing also seemed to normalize w/ this   Assessment/Plan    PT Assessment Patient needs continued PT services  PT Problem List Decreased strength;Decreased range of motion;Decreased activity tolerance;Decreased balance;Decreased mobility;Decreased coordination;Decreased knowledge of use of DME;Decreased safety awareness;Pain;Obesity       PT Treatment Interventions DME instruction;Gait training;Functional mobility training;Therapeutic activities;Therapeutic exercise;Balance  training;Neuromuscular re-education;Patient/family education;Manual techniques    PT Goals (Current goals can be found in the Care Plan section)  Acute Rehab PT Goals Patient Stated Goal: have less pain PT Goal Formulation: With patient Time For Goal Achievement: 11/18/19 Potential to Achieve Goals: Fair    Frequency Min 2X/week   Barriers to discharge Other (comment) lives alone but states a daughter would stay with her at dc    Co-evaluation               AM-PAC PT "6 Clicks" Mobility  Outcome Measure Help needed turning from your back to your side while in a flat bed without using bedrails?: A Lot Help needed moving from lying on your back to sitting on the side of a flat bed without using bedrails?: A Lot Help needed moving to and from a bed to a chair (including a wheelchair)?: A Lot Help needed standing up from a chair using your arms (e.g., wheelchair or bedside chair)?: A Lot Help needed to walk in hospital room?: A Lot Help needed climbing 3-5 steps with a railing? : Total 6 Click Score: 11    End of Session Equipment Utilized During Treatment: Gait belt Activity Tolerance: Patient limited by fatigue;Patient limited by pain Patient left:  in bed;with call bell/phone within reach;with bed alarm set Nurse Communication: Mobility status PT Visit Diagnosis: Unsteadiness on feet (R26.81);Repeated falls (R29.6);Other abnormalities of gait and mobility (R26.89);Muscle weakness (generalized) (M62.81);History of falling (Z91.81)    Time: 0920-1000 PT Time Calculation (min) (ACUTE ONLY): 40 min   Charges:   PT Evaluation $PT Eval Moderate Complexity: 1 Mod PT Treatments $Therapeutic Activity: 23-37 mins        Horald Chestnut, PT   Delford Field 11/04/2019, 1:55 PM

## 2019-11-04 NOTE — Progress Notes (Signed)
Subjective: Pt seen at the bedside this morning. Endorses that she has had bilateral LE swelling (R > L) and was taking torsemide 20mg  BID since her last PCP visit on 2/26. States she fell yesterday after standing up from her wheelchair to fix her dress. Then remembers getting picked up from her doctor's appointment and then being in the ED. Endorses multiple falls at home secondary to her "right leg giving out." Pt also continuing to have some R groin pain for past few weeks.  Objective:  Vital signs in last 24 hours: Vitals:   11/04/19 0640 11/04/19 0735 11/04/19 1133 11/04/19 1412  BP: (!) 164/95 (!) 163/82 92/60 131/60  Pulse: 76 78 76 78  Resp: 20 18    Temp:  98.8 F (37.1 C) 99.1 F (37.3 C)   TempSrc:  Oral Oral   SpO2: 96% 100% 95%   Weight:      Height:       Physical Exam Vitals and nursing note reviewed.  Constitutional:      General: She is not in acute distress.    Appearance: She is not ill-appearing.     Comments: Elderly female, laying comfortably in bed.  Musculoskeletal:     Comments: Bilateral LE pitting edema to above the knee (R >>> L)  Skin:    General: Skin is warm and dry.     Comments: Bilateral knee replacement scars  Neurological:     Mental Status: She is alert.     Comments: Generalized weakness of bilateral LE's with poor effort on exam.    Assessment/Plan:  Active Problems:   AKI (acute kidney injury) ([place])  Alisha [name] is a 69 year old F with significant PMH of obesity, polypharmacy, recurrent falls, hypertension, chronic venous insufficiency, osteoarthritis, chronic pain/fibromyalgia, asthma/OSA, and bilateral knee replacements, who presents after a mechanical fall in the setting of bilateral LE edema and hypotension.  Fall Hypotension - resolved Bradycardia - improving Pt has a documented history of recurrent falls for past year. States she fell yesterday after standing up and moving around her wheelchair yesterday to fix her dress.  Denies prodromal symptoms. Pt has impaired memory surrounding the event. CT head in the ED negative. Of note, has been on an increased outpatient diuretic regimen due to increased swelling. - episodes like due to combination of hypotension, deconditioning, polypharmacy, and RLE pain and associated weakness - PT/OT eval - thorough med list review, will request from PCP at [facility] - BP improved with fluid resuscitation - f/u orthostatics - holding anti-hypertensives, opioids, muscle relaxants, and diuretics - continue telemetry  Unilateral edema of the R leg: Venous stasis of the BLE: Remote hx of DVT: Pt with bilateral LE swelling, R > L, and subsequent R leg pain. Since in the ED on 2/23 for this, LE dopplers at that time negative for DVT. Pt on an increased outpatient diuretic dose of torsemide 20mg  BID which did not improve her symptoms. Pt on many medications with reported side effects of edema, chiefly gabapentin and amitriptyline. Suspect RLE swelling secondary to venous stasis.   - R hip radiographs after fall negative - suspect R groin pain secondary to recurrent falls at home (pt fell in shower 1-2 weeks prior to presentation) - continue to hold diuretics  - reconcile outpatient med list with PCP and consider discontinuation as appropriate  AKI: Cr on admission 1.7 (1.3 two weeks prior and 0.65 4 months ago). - suspect secondary to intravascular depletion secondary to increased  diuretics and hypotension at home - Cr improved to 1.3 this morning after 1.5L fluid bolus - FeNa 0% indicating pre-renal etiology - UA with 30 protein, trace leukocytes, 6-10 epithelial cells, and hyaline casts - continue to hold pt's home BP meds   Normocytic Anemia: Hgb 11.4 on admission, with prior baseline between 13-14. - follow-up CBC in the AM - could consider iron studies  Prior to Admission Living Arrangement: independently at home Anticipated Discharge Location: SNF Barriers to  Discharge: placement Dispo: Anticipated discharge in approximately 1-2 day(s).   Ladona Horns, MD 11/04/2019, 2:41 PM Pager: [phone]

## 2019-11-04 NOTE — Evaluation (Signed)
Occupational Therapy Evaluation Patient Details Name: Alisha [name] MRN: [id number] DOB: 09-11-1950 Today's Date: 11/04/2019    History of Present Illness 69 y.o female with Obesity, HTN, Asthma, Chronic venous insufficiency, Depression, and Polypharmacy who presented to the ED after a fall.   Clinical Impression   Patient is a 69 year old female that lives alone in an apartment with elevator access. Patient reports she is modified independent with self care, IADLs with children helping with transportation. Patient reports she is limited ambulator, always uses her rollator so she has somewhere to sit and her wheelchair out in the community. Currently, patient requires min/mod A to stand from elevated bed height with increased dyspnea with bed mobility, sit to stand. Unable to obtain accurate O2 reading, HR remain in 90s with activity. Recommend continued acute OT services to maximize patient safety and independence with self care.  Orthostatic Vitals: supine 138/76, sitting 145/68, standing 161/149 (patient coughing and having difficulty remaining still in standing, RN notified).     Follow Up Recommendations  SNF    Equipment Recommendations  Other (comment)(defer to next venue)       Precautions / Restrictions Precautions Precautions: Fall Precaution Comments: RLE pain and instability Restrictions Weight Bearing Restrictions: No      Mobility Bed Mobility Overal bed mobility: Needs Assistance Bed Mobility: Supine to Sit;Sit to Supine     Supine to sit: HOB elevated;Mod assist Sit to supine: Mod assist   General bed mobility comments: requires assist to mobilize R LE   Transfers Overall transfer level: Needs assistance Equipment used: Rolling walker (2 wheeled) Transfers: Sit to/from Stand Sit to Stand: Min assist;Mod assist;From elevated surface         General transfer comment: requires assist to power up to standing, labored breathing with activity, unable to  obtain O2 reading     Balance Overall balance assessment: Needs assistance;History of Falls Sitting-balance support: Feet supported;No upper extremity supported Sitting balance-Leahy Scale: Good     Standing balance support: Bilateral upper extremity supported;During functional activity Standing balance-Leahy Scale: Poor Standing balance comment: reliant on B UE support                           ADL either performed or assessed with clinical judgement   ADL Overall ADL's : Needs assistance/impaired Eating/Feeding: Independent   Grooming: Set up;Sitting   Upper Body Bathing: Set up;Sitting   Lower Body Bathing: Moderate assistance;Sitting/lateral leans;Sit to/from stand   Upper Body Dressing : Set up;Sitting   Lower Body Dressing: Moderate assistance;Sit to/from stand;Sitting/lateral leans Lower Body Dressing Details (indicate cue type and reason): patient does not wear socks Toilet Transfer: Minimal assistance;Cueing for safety;RW;BSC;Ambulation;Moderate assistance Toilet Transfer Details (indicate cue type and reason): simulated with functional mobility, requires bed height to be elevated to stand with min/mod A, decreased activity tolerance Toileting- Clothing Manipulation and Hygiene: Moderate assistance;Sitting/lateral lean;Sit to/from stand Toileting - Clothing Manipulation Details (indicate cue type and reason): due to decreased standing tolerance     Functional mobility during ADLs: Minimal assistance;Rolling walker;Cueing for safety General ADL Comments: patient requires increased assist with self care tasks due to decreased activity tolerance, increased pain with mobility, decreased balance                  Pertinent Vitals/Pain Pain Assessment: Faces Faces Pain Scale: Hurts even more Pain Location: R LE Pain Descriptors / Indicators: Guarding;Grimacing;Aching;Radiating Pain Intervention(s): Monitored during session     Hand Dominance  Right    Extremity/Trunk Assessment Upper Extremity Assessment Upper Extremity Assessment: Overall WFL for tasks assessed   Lower Extremity Assessment Lower Extremity Assessment: Defer to PT evaluation       Communication Communication Communication: No difficulties   Cognition Arousal/Alertness: Awake/alert Behavior During Therapy: WFL for tasks assessed/performed Overall Cognitive Status: No family/caregiver present to determine baseline cognitive functioning Area of Impairment: Memory                     Memory: Decreased short-term memory         General Comments: tangential   General Comments  room air throughout session, unable to obtain accurate O2 level (pt wearing nail polish), HR maintained in 90s during activity            Home Living Family/patient expects to be discharged to:: Private residence Living Arrangements: Alone Available Help at Discharge: Family Type of Home: Apartment Home Access: Elevator     Home Layout: One level     Bathroom Shower/Tub: Occupational psychologist: Standard Bathroom Accessibility: Yes How Accessible: Accessible via walker Home Equipment: Fifty-[employer] - 4 wheels;Wheelchair - Education administrator (comment);Shower seat - built in;Grab bars - toilet;Grab bars - tub/shower   Additional Comments: sleeps in a recliner      Prior Functioning/Environment Level of Independence: Independent with assistive device(s)                 OT Problem List: Decreased activity tolerance;Impaired balance (sitting and/or standing);Decreased safety awareness;Decreased knowledge of use of DME or AE;Pain;Obesity      OT Treatment/Interventions: Self-care/ADL training;Therapeutic exercise;Energy conservation;DME and/or AE instruction;Therapeutic activities;Patient/family education;Balance training    OT Goals(Current goals can be found in the care plan section) Acute Rehab OT Goals Patient Stated Goal: have less pain OT Goal Formulation:  With patient Time For Goal Achievement: 11/18/19 Potential to Achieve Goals: Good  OT Frequency: Min 2X/week    AM-PAC OT "6 Clicks" Daily Activity     Outcome Measure Help from another person eating meals?: None Help from another person taking care of personal grooming?: A Little Help from another person toileting, which includes using toliet, bedpan, or urinal?: A Lot Help from another person bathing (including washing, rinsing, drying)?: A Lot Help from another person to put on and taking off regular upper body clothing?: A Little Help from another person to put on and taking off regular lower body clothing?: A Lot 6 Click Score: 16   End of Session Equipment Utilized During Treatment: Rolling walker Nurse Communication: Other (comment);Mobility status(orthostatic vitals)  Activity Tolerance: Patient limited by fatigue Patient left: in bed;with call bell/phone within reach;with bed alarm set  OT Visit Diagnosis: Other abnormalities of gait and mobility (R26.89);History of falling (Z91.81);Pain Pain - Right/Left: Right Pain - part of body: Leg                Time: [id number] OT Time Calculation (min): 38 min Charges:  OT General Charges $OT Visit: 1 Visit OT Evaluation $OT Eval Moderate Complexity: 1 Mod OT Treatments $Self Care/Home Management : 23-37 mins  Shon Millet OT OT office: [place] 11/04/2019, 1:18 PM

## 2019-11-05 ENCOUNTER — Encounter (HOSPITAL_COMMUNITY): Payer: Self-pay | Admitting: Internal Medicine

## 2019-11-05 LAB — BASIC METABOLIC PANEL
Anion gap: 8 (ref 5–15)
BUN: 6 mg/dL — ABNORMAL LOW (ref 8–23)
CO2: 24 mmol/L (ref 22–32)
Calcium: 8.9 mg/dL (ref 8.9–10.3)
Chloride: 110 mmol/L (ref 98–111)
Creatinine, Ser: 1.07 mg/dL — ABNORMAL HIGH (ref 0.44–1.00)
GFR calc Af Amer: >60 mL/min (ref >60)
GFR calc non Af Amer: 53 mL/min — ABNORMAL LOW (ref >60)
Glucose, Bld: 121 mg/dL — ABNORMAL HIGH (ref 70–99)
Potassium: 4.9 mmol/L (ref 3.5–5.1)
Sodium: 142 mmol/L (ref 135–145)

## 2019-11-05 LAB — CBC
HCT: 37.4 % (ref 36.0–46.0)
Hemoglobin: 12.4 g/dL (ref 12.0–15.0)
MCH: 28.7 pg (ref 26.0–34.0)
MCHC: 33.2 g/dL (ref 30.0–36.0)
MCV: 86.6 fL (ref 80.0–100.0)
Platelets: 327 10*3/uL (ref 150–400)
RBC: 4.32 MIL/uL (ref 3.87–5.11)
RDW: 12.8 % (ref 11.5–15.5)
WBC: 5 10*3/uL (ref 4.0–10.5)
nRBC: 0 % (ref 0.0–0.2)

## 2019-11-05 LAB — GLUCOSE, CAPILLARY
Glucose-Capillary: 109 mg/dL — ABNORMAL HIGH (ref 70–99)
Glucose-Capillary: 106 mg/dL — ABNORMAL HIGH (ref 70–99)
Glucose-Capillary: 119 mg/dL — ABNORMAL HIGH (ref 70–99)
Glucose-Capillary: 173 mg/dL — ABNORMAL HIGH (ref 70–99)

## 2019-11-05 NOTE — Progress Notes (Signed)
   Subjective: Pt seen at the bedside this morning. State she is feeling well. Endorses that her leg/groin pain has improved somewhat. Denies chest pain, shortness of breath, leg swelling, or abdominal pain. Having some constipation.  Discussed discharge to SNF facility. Pt states she is agreeable in order to improve her functionality.  Objective:  Vital signs in last 24 hours: Vitals:   11/05/19 0016 11/05/19 0058 11/05/19 0540 11/05/19 0600  BP: (!) 166/77  (!) 145/75   Pulse: 79  (!) 59 80  Resp: 20  20   Temp: 98.7 F (37.1 C)  98.1 F (36.7 C)   TempSrc: Oral  Oral   SpO2: 100%  (!) 81% 99%  Weight:  (!) 138.3 kg    Height:       Physical Exam Vitals and nursing note reviewed.  Constitutional:      General: She is not in acute distress.    Appearance: She is not ill-appearing.  Cardiovascular:     Rate and Rhythm: Normal rate and regular rhythm.     Heart sounds: Normal heart sounds.  Musculoskeletal:        General: No tenderness, deformity or signs of injury.     Comments: Stable LE edema, R >> L.  Skin:    General: Skin is warm and dry.  Neurological:     Mental Status: She is alert.    Assessment/Plan:  Active Problems:   AKI (acute kidney injury) ([place])  Ms. Luallen is a 69 year old F with significant PMH of obesity, polypharmacy, recurrent falls, hypertension, chronic venous insufficiency, osteoarthritis, chronic pain/fibromyalgia, asthma/OSA, and bilateral knee replacements, who presents after a mechanical fall in the setting of bilateral LE edema and hypotension.  Fall Hypotension - resolved Bradycardia - resolved Pt has a documented history of recurrent falls for past year. Most recent episode prior to admission likely secondary to polypharmacy, hypotension from recent increase in diuresis, and deconditioning. - BP improved to 140s/70s - PT/OT recommending SNF placement - discussed SNF with pt and daughters, all are agreeable to placement in order to  improve pt's functionality and reduce fall risk - holding home anti-hypertensives, opioids, muscle relaxants, and diuretics  Unilateral edema of the R leg:  Venous stasis of the BLE: Remote hx of DVT: Pt with bilateral LE swelling, R > L, and subsequent R leg pain. LE dopplers from 2/23 negative for DVT. Increased outpatient diuretic did not improve her symptoms. Pt on many medications with reported side effects of edema, chiefly gabapentin and amitriptyline. Suspect RLE swelling secondary to venous stasis.   - PCP in process of vascular outpatient referral for venous stasis vs lymphedema per 2/26 outpatient progress note - reconcile outpatient med list with PCP and consider discontinuation as appropriate  AKI - resolved Cr on admission 1.7 (1.3 two weeks prior and 0.65 4 months ago). - suspect secondary to intravascular depletion secondary to increased diuretics and hypotension at home - Cr 1.07 today - continue to hold pt's home BP meds to avoid further hypotension  Normocytic Anemia - resolved Hgb mildly low on admission to 11.4, suspect lab variability  - Hgb this morning 12.4  Prior to Admission Living Arrangement: home Anticipated Discharge Location: SNF Barriers to Discharge: placement Dispo: Anticipated discharge in approximately 1-2 day(s).   Ladona Horns, MD 11/05/2019, 7:11 AM Pager: [phone]

## 2019-11-05 NOTE — TOC Initial Note (Addendum)
Transition of Care [facility]) - Initial/Assessment Note    Patient Details  Name: Alisha [name] MRN: [id number] Date of Birth: 1951-01-31  Transition of Care [facility]) CM/SW Contact:    Alberteen Sam, LCSW Phone Number: 11/05/2019, 1:20 PM  Clinical Narrative:                  CSW met with patient at bedside to inform of SNF rec for short term rehab, patient agreeable and reports preference for high rated facility and also if could be near her home near [address] in [place].   CSW has faxed out referrals to [facility], [facility], [address] [place] and [place]. Pending bed offers at this time.   Insurance auth started with [facility]. Reference number [id number].   Expected Discharge Plan: Skilled Nursing Facility Barriers to Discharge: Continued Medical Work up   Patient Goals and CMS Choice Patient states their goals for this hospitalization and ongoing recovery are:: to go to rehab CMS Medicare.gov Compare Post Acute Care list provided to:: Patient Choice offered to / list presented to : Patient  Expected Discharge Plan and Services Expected Discharge Plan: [place] Choice: [place] arrangements for the past 2 months: Single Family Home                                      Prior Living Arrangements/Services Living arrangements for the past 2 months: Single Family Home Lives with:: Self Patient language and need for interpreter reviewed:: Yes Do you feel safe going back to the place where you live?: Yes      Need for Family Participation in Patient Care: Yes (Comment) Care giver support system in place?: Yes (comment)   Criminal Activity/Legal Involvement Pertinent to Current Situation/Hospitalization: No - Comment as needed  Activities of Daily Living Home Assistive Devices/Equipment: Cane (specify quad or straight), Walker (specify type), Wheelchair ADL Screening (condition at time of  admission) Patient's cognitive ability adequate to safely complete daily activities?: Yes Is the patient deaf or have difficulty hearing?: No Does the patient have difficulty seeing, even when wearing glasses/contacts?: No Does the patient have difficulty concentrating, remembering, or making decisions?: No Patient able to express need for assistance with ADLs?: Yes Does the patient have difficulty dressing or bathing?: No Independently performs ADLs?: Yes (appropriate for developmental age) Does the patient have difficulty walking or climbing stairs?: Yes Weakness of Legs: Both Weakness of Arms/Hands: None  Permission Sought/Granted Permission sought to share information with : Case Manager, Customer service manager, Family Supports       Permission granted to share info w AGENCY: SNFs        Emotional Assessment Appearance:: Appears stated age Attitude/Demeanor/Rapport: Gracious Affect (typically observed): Calm Orientation: : Oriented to Self, Oriented to Place, Oriented to  Time, Oriented to Situation Alcohol / Substance Use: Not Applicable Psych Involvement: No (comment)  Admission diagnosis:  Fatigue [R53.83] Weakness [R53.1] Intravascular volume depletion [E86.1] AKI (acute kidney injury) ([place]) [N17.9] Hypotension, unspecified hypotension type [I95.9] Patient Active Problem List   Diagnosis Date Noted  . AKI (acute kidney injury) ([place]) 11/03/2019  . Recurrent falls 09/22/2018  . Chronic use of opiate drug for therapeutic purpose 04/11/2018  . Bradycardia 04/11/2018  . Chronic fatigue 04/11/2018  . Prediabetes 12/17/2017  . Mild cognitive impairment 12/13/2017  . Cough variant asthma vs UACS 10/26/2017  .  Upper airway cough syndrome 10/25/2017  . Medial epicondylitis, right 09/16/2017  . Right shoulder pain 09/07/2017  . Lateral epicondylitis of right elbow 09/07/2017  . Vitamin D deficiency 07/13/2017  . History of foot fracture 07/12/2017  . History of  colon polyps 07/12/2017  . Cataract of both eyes 07/11/2017  . Hypertensive retinopathy of both eyes 07/11/2017  . Fibromyalgia 05/17/2017  . Tenosynovitis, wrist 05/14/2017  . Chronic cough 05/02/2017  . Foot pain, left 05/02/2017  . Dysphagia 03/23/2017  . Health care maintenance 03/23/2017  . Coronary artery disease involving native coronary artery of native heart without angina pectoris 01/11/2017  . Hyperlipidemia 01/11/2017  . Polypharmacy 11/25/2016  . Venous stasis of both lower extremities 10/04/2016  . Poor dentition 10/04/2016  . OSA (obstructive sleep apnea) 10/04/2016  . History of nephrolithiasis 10/04/2016  . Hx of pilonidal cyst 10/04/2016  . Bipolar depression ([place]) 10/04/2016  . Personal history of DVT (deep vein thrombosis) 10/04/2016  . Morbid obesity due to excess calories ([place]) 09/25/2016  . History of pituitary adenoma s/p resection 09/25/2016  . Essential hypertension 09/25/2016  . Psoriatic arthritis ([place]) 09/25/2016  . Vision impairment 09/25/2016  . Chronic seasonal allergic rhinitis due to pollen 09/25/2016  . Moderate persistent asthma without complication 22/56/7209  . Gastroesophageal reflux disease without esophagitis 09/25/2016  . Urge incontinence 09/25/2016  . Chronic pain syndrome 09/25/2016  . Candidal intertrigo 05/10/2010   PCP:  Sonia Side., FNP Pharmacy:   [id number] [id number]: [phone]: [phone]    Social Determinants of Health (SDOH) Interventions    Readmission Risk Interventions No flowsheet data found.

## 2019-11-05 NOTE — NC FL2 (Signed)
[place] MEDICAID FL2 LEVEL OF CARE SCREENING TOOL     IDENTIFICATION  Patient Name: Alisha [name] Birthdate: [birth year] Sex: female Admission Date (Current Location): 11/03/2019  [facility] and [place] Number:  Herbalist and Address:  [facility], [place] [address] [place], [place] [address]      Provider Number: [id number]  Attending Physician Name and Address:  Lucious Groves, DO  Relative Name and Phone Number:  [name] (daughter) [phone]    Current Level of Care: Hospital Recommended Level of Care: [place] Prior Approval Number:    Date Approved/Denied:   PASRR Number: [id number]  Discharge Plan: SNF    Current Diagnoses: Patient Active Problem List   Diagnosis Date Noted  . AKI (acute kidney injury) ([place]) 11/03/2019  . Recurrent falls 09/22/2018  . Chronic use of opiate drug for therapeutic purpose 04/11/2018  . Bradycardia 04/11/2018  . Chronic fatigue 04/11/2018  . Prediabetes 12/17/2017  . Mild cognitive impairment 12/13/2017  . Cough variant asthma vs UACS 10/26/2017  . Upper airway cough syndrome 10/25/2017  . Medial epicondylitis, right 09/16/2017  . Right shoulder pain 09/07/2017  . Lateral epicondylitis of right elbow 09/07/2017  . Vitamin D deficiency 07/13/2017  . History of foot fracture 07/12/2017  . History of colon polyps 07/12/2017  . Cataract of both eyes 07/11/2017  . Hypertensive retinopathy of both eyes 07/11/2017  . Fibromyalgia 05/17/2017  . Tenosynovitis, wrist 05/14/2017  . Chronic cough 05/02/2017  . Foot pain, left 05/02/2017  . Dysphagia 03/23/2017  . Health care maintenance 03/23/2017  . Coronary artery disease involving native coronary artery of native heart without angina pectoris 01/11/2017  . Hyperlipidemia 01/11/2017  . Polypharmacy 11/25/2016  . Venous stasis of both lower extremities 10/04/2016  . Poor dentition 10/04/2016  . OSA (obstructive sleep apnea)  10/04/2016  . History of nephrolithiasis 10/04/2016  . Hx of pilonidal cyst 10/04/2016  . Bipolar depression ([place]) 10/04/2016  . Personal history of DVT (deep vein thrombosis) 10/04/2016  . Morbid obesity due to excess calories ([place]) 09/25/2016  . History of pituitary adenoma s/p resection 09/25/2016  . Essential hypertension 09/25/2016  . Psoriatic arthritis ([place]) 09/25/2016  . Vision impairment 09/25/2016  . Chronic seasonal allergic rhinitis due to pollen 09/25/2016  . Moderate persistent asthma without complication [id number]  . Gastroesophageal reflux disease without esophagitis 09/25/2016  . Urge incontinence 09/25/2016  . Chronic pain syndrome 09/25/2016  . Candidal intertrigo 05/10/2010    Orientation RESPIRATION BLADDER Height & Weight     Self, Time, Situation, Place  Normal Incontinent, External catheter Weight: (!) 304 lb 14.4 oz (138.3 kg) Height:  5\' 6"  (167.6 cm)  BEHAVIORAL SYMPTOMS/MOOD NEUROLOGICAL BOWEL NUTRITION STATUS      Continent Diet(see discharge summary)  AMBULATORY STATUS COMMUNICATION OF NEEDS Skin   Limited Assist Verbally Other (Comment)(ecchymosis legs, MASD right breast and left groin)                       Personal Care Assistance Level of Assistance  Bathing, Dressing, Total care, Feeding Bathing Assistance: Limited assistance Feeding assistance: Independent Dressing Assistance: Limited assistance Total Care Assistance: Limited assistance   Functional Limitations Info  Sight, Hearing, Speech Sight Info: Adequate Hearing Info: Adequate Speech Info: Adequate    SPECIAL CARE FACTORS FREQUENCY  PT (By licensed PT), OT (By licensed OT)     PT Frequency: min 5x weekly OT Frequency: min 5x weekly  Contractures Contractures Info: Not present    Additional Factors Info  Code Status, Allergies Code Status Info: full Allergies Info: Ibuprofen, Ampicillin, Asa (aspirin), Darvon (propoxyphene), Demeclocycline, eggs or  egg-derived products, lactalbumin, lisinopril, milk-related compounds, salicylates, tetracycline Hcl, tetracyclines and related           Current Medications (11/05/2019):  This is the current hospital active medication list Current Facility-Administered Medications  Medication Dose Route Frequency Provider Last Rate Last Admin  . acetaminophen (TYLENOL) tablet 650 mg  650 mg Oral Q6H PRN Ina Homes, MD   650 mg at 11/04/19 1137   Or  . acetaminophen (TYLENOL) suppository 650 mg  650 mg Rectal Q6H PRN Helberg, Larkin Ina, MD      . albuterol (PROVENTIL) (2.5 MG/3ML) 0.083% nebulizer solution 2.5 mg  2.5 mg Inhalation Q6H PRN Ina Homes, MD      . aspirin EC tablet 81 mg  81 mg Oral Daily Helberg, Larkin Ina, MD   81 mg at 11/05/19 1009  . atorvastatin (LIPITOR) tablet 80 mg  80 mg Oral QHS Ina Homes, MD   80 mg at 11/04/19 2047  . clopidogrel (PLAVIX) tablet 75 mg  75 mg Oral Daily Ina Homes, MD   75 mg at 11/05/19 1009  . enoxaparin (LOVENOX) injection 70 mg  70 mg Subcutaneous Q24H Ina Homes, MD   70 mg at 11/04/19 2048  . gabapentin (NEURONTIN) capsule 300 mg  300 mg Oral TID Ina Homes, MD   300 mg at 11/05/19 1008  . insulin aspart (novoLOG) injection 0-15 Units  0-15 Units Subcutaneous TID WC Ina Homes, MD   2 Units at 11/04/19 1735  . loratadine (CLARITIN) tablet 10 mg  10 mg Oral Daily Ina Homes, MD   10 mg at 11/05/19 1009  . mirabegron ER (MYRBETRIQ) tablet 50 mg  50 mg Oral Daily Ina Homes, MD   50 mg at 11/05/19 1008  . ondansetron (ZOFRAN) tablet 4 mg  4 mg Oral Q6H PRN Ina Homes, MD       Or  . ondansetron (ZOFRAN) injection 4 mg  4 mg Intravenous Q6H PRN Helberg, Justin, MD      . oxybutynin (DITROPAN XL) 24 hr tablet 15 mg  15 mg Oral Daily Ina Homes, MD   15 mg at 11/05/19 1008  . pantoprazole (PROTONIX) EC tablet 40 mg  40 mg Oral BID Ina Homes, MD   40 mg at 11/05/19 1009  . topiramate (TOPAMAX) tablet 50 mg  50  mg Oral QHS Ina Homes, MD   50 mg at 11/04/19 2047  . umeclidinium bromide (INCRUSE ELLIPTA) 62.5 MCG/INH 2 puff  2 puff Inhalation Daily Ina Homes, MD   2 puff at 11/05/19 1012     Discharge Medications: Please see discharge summary for a list of discharge medications.  Relevant Imaging Results:  Relevant Lab Results:   Additional Information SSN: [id number]  Alberteen Sam, LCSW

## 2019-11-06 DIAGNOSIS — Z79899 Other long term (current) drug therapy: Secondary | ICD-10-CM

## 2019-11-06 DIAGNOSIS — I872 Venous insufficiency (chronic) (peripheral): Secondary | ICD-10-CM

## 2019-11-06 LAB — SARS CORONAVIRUS 2 (TAT 6-24 HRS): SARS Coronavirus 2: NEGATIVE

## 2019-11-06 LAB — GLUCOSE, CAPILLARY
Glucose-Capillary: 121 mg/dL — ABNORMAL HIGH (ref 70–99)
Glucose-Capillary: 118 mg/dL — ABNORMAL HIGH (ref 70–99)
Glucose-Capillary: 112 mg/dL — ABNORMAL HIGH (ref 70–99)

## 2019-11-06 MED ORDER — TIZANIDINE HCL 4 MG PO TABS: 6.0000 mg | ORAL_TABLET | Freq: Every day | ORAL | 0 refills | Status: DC | PRN

## 2019-11-06 MED ORDER — AMITRIPTYLINE HCL 50 MG PO TABS: 50.0000 mg | ORAL_TABLET | Freq: Every day | ORAL | 0 refills | Status: DC

## 2019-11-06 NOTE — Discharge Summary (Signed)
Name: Alisha [name] MRN: [id number] DOB: 1951/01/19 69 y.o. PCP: Sonia Side., FNP  Date of Admission: 11/03/2019  1:00 PM Date of Discharge: 11/06/2019 Attending Physician: Lucious Groves, DO  Discharge Diagnosis: 1. Fall 2. Polypharmacy 3. Hypotension 4. Bradycardia 5. RLE edema 6. Venous stasis of bilateral lower extremities  Discharge Medications: Allergies as of 11/06/2019      Reactions   Ibuprofen    Other reaction(s): Upset Stomach   Ampicillin Nausea Only   Asa [aspirin] Diarrhea   Other reaction(s): Upset Stomach   Darvon [propoxyphene] Nausea And Vomiting   Demeclocycline Other (See Comments)   Nerves feeling   Eggs Or Egg-derived Products Diarrhea   Lactalbumin Diarrhea   Lisinopril Cough   Milk-related Compounds Diarrhea   Salicylates Other (See Comments)   Unknown Unknown   Tetracycline Hcl Hives, Nausea Only   Tetracyclines & Related Other (See Comments)   Nerves feeling      Medication List    STOP taking these medications   torsemide 20 MG tablet Commonly known as: DEMADEX     TAKE these medications   Ventolin HFA 108 (90 Base) MCG/ACT inhaler Generic drug: albuterol Inhale 1 puff into the lungs every 6 (six) hours as needed for wheezing or shortness of breath.   albuterol (2.5 MG/3ML) 0.083% nebulizer solution Commonly known as: PROVENTIL Take 3 mLs (2.5 mg total) by nebulization every 6 (six) hours as needed for wheezing or shortness of breath.   amitriptyline 50 MG tablet Commonly known as: ELAVIL Take 1 tablet (50 mg total) by mouth at bedtime. What changed:   medication strength  how much to take   aspirin EC 81 MG tablet Take 81 mg by mouth daily.   atorvastatin 80 MG tablet Commonly known as: LIPITOR Take 80 mg by mouth at bedtime.   budesonide-formoterol 80-4.5 MCG/ACT inhaler Commonly known as: Symbicort Inhale 2 puffs into the lungs 2 (two) times daily.   clopidogrel 75 MG tablet Commonly known as:  PLAVIX Take 1 tablet (75 mg total) by mouth daily.   cyclobenzaprine 5 MG tablet Commonly known as: FLEXERIL Take 1 tablet (5 mg total) by mouth 3 (three) times daily as needed for muscle spasms.   diclofenac 50 MG tablet Commonly known as: CATAFLAM TAKE 1 TABLET BY MOUTH 3 TIMES A DAY   DULoxetine 60 MG capsule Commonly known as: Cymbalta Take 1 capsule (60 mg total) by mouth daily.   flurbiprofen 50 MG tablet Commonly known as: ANSAID Take 1 tablet every 8 hours as needed, maximum 3 tablets/24 hours   furosemide 40 MG tablet Commonly known as: LASIX Take 1 tablet (40 mg total) by mouth daily.   gabapentin 300 MG capsule Commonly known as: NEURONTIN Take 300 mg by mouth 3 (three) times daily. What changed: Another medication with the same name was removed. Continue taking this medication, and follow the directions you see here.   loratadine 10 MG tablet Commonly known as: CLARITIN Take 10 mg by mouth daily.   losartan 100 MG tablet Commonly known as: COZAAR Take 1 tablet (100 mg total) daily by mouth.   metFORMIN 500 MG tablet Commonly known as: GLUCOPHAGE Take 500 mg by mouth 2 (two) times daily with a meal.   Myrbetriq 50 MG Tb24 tablet Generic drug: mirabegron ER Take 50 mg by mouth daily.   oxybutynin 15 MG 24 hr tablet Commonly known as: DITROPAN XL Take 15 mg by mouth daily.   pantoprazole 40 MG tablet Commonly  known as: PROTONIX Take 40 mg by mouth 2 (two) times daily.   predniSONE 20 MG tablet Commonly known as: DELTASONE Take 60 mg daily x 2 days then 40 mg daily x 2 days then 20 mg daily x 2 days   Spiriva Respimat 1.25 MCG/ACT Aers Generic drug: Tiotropium Bromide Monohydrate Inhale 2 puffs into the lungs daily.   tiZANidine 4 MG tablet Commonly known as: ZANAFLEX Take 1.5 tablets (6 mg total) by mouth daily as needed for muscle spasms. What changed: See the new instructions.   topiramate 50 MG tablet Commonly known as: TOPAMAX Take 50 mg  by mouth at bedtime.       Disposition and follow-up:   Alisha [name] was discharged from [facility] in Stable condition.  At the hospital follow up visit please address:  1.  Fall, hypotension, and bradycardia - secondary to deconditioning and over diuresis outpatient - work with PT/OT outpatient - assess safety to d/c independently at home given mental and functional status - resume 40mg  PO lasix  Polypharmacy  Would recommend - amitriptyline taper, decreased dose to 50mg  daily at discharge - discontinuation of gabapentin - avoiding centrally acting medications in this elderly patient, specifically muscle relaxants, opioids, anticholingerics   RLE edema - LE doppler negative for DVT on 2/23 - continue outpatient referral to vascular for lymphedema vs venous stasis - recommend outpatient review of medications for side effects of LE edema  2.  Labs / imaging needed at time of follow-up: none  3.  Pending labs/ test needing follow-up: none  Follow-up Appointments:  Contact information for follow-up providers    Sonia Side., FNP.   Specialty: Family Medicine Contact information: [place] [address] [phone]            Contact information for after-discharge care    Destination    [facility] .   Service: Skilled Nursing Contact information: [place] [facility] Course by problem list: 1. Alisha [name] is a 69 year old F with significant PMH of obesity, polypharmacy, recurrent falls, hypertension, chronic venous insufficiency, osteoarthritis, chronic pain/fibromyalgia, asthma/OSA, and bilateral knee replacements, who presents after a mechanical fall in the setting of bilateral LE edema and hypotension.  Fall Pt had a fall after getting up from her wheelchair to fix her dress on 3/8. Denies prodromal  symptoms, head trauma, or loss of consciousness. Was able to get back in the wheelchair and make her PCP appointment, who recommended she be evaluated in the ED. Pt has impaired recall of the event, remembering falling, sitting back in the wheelchair, and then being in the emergency room. Of note, she presented to the ED 2 weeks prior for LE edema. She was prescribed Lasix which only slightly improved her symptoms, so she was prescribed a trial of torsemide. It is uncertain if the pt was taking both diuretics in combination. Suspect pt was over diuresed and became dehydrated which contributed to her hypotension, orthostatsis, and AKI on admission on 3/8. Was resuscitated with 1.5L fluid bolus which resolved her hypotension and AKI. CT head negative. R hip x-ray's clear. Pt able to work with PT/OT inpatient who recommended discharge to SNF.  RLE edema Has bilateral lower extremity pitting edema R > L. LE doppler on 2/23 was negative for a DVT. Suspect component of venous insuffiencey  vs lymphedema from prior knee replacement surgery. Pt's PCP already in process of outpatient vascular referral for doppler ultrasound to look for venous reflux. Would recommend continued outpatient follow-up to address this problem. Additionally, pt also on multiple medication which could be worsening LE edema, chiefly gabapentin and amitriptyline. Decreased dose of amitriptyline at discharge to 50mg  daily and would recommend continued taper. Would also consider discontinuation of gabapentin at outpatient.  Discharge Vitals:   BP (!) 149/68 (BP Location: Right Arm)   Pulse 67   Temp 98.2 F (36.8 C) (Oral)   Resp 16   Ht 5\' 6"  (1.676 m)   Wt (!) 138.2 kg   SpO2 (!) 74%   BMI 49.18 kg/m   Pertinent Labs, Studies, and Procedures:  CBC Latest Ref Rng & Units 11/05/2019 11/03/2019 10/22/2019  WBC 4.0 - 10.5 K/uL 5.0 6.2 -  Hemoglobin 12.0 - 15.0 g/dL 12.4 11.4(L) 12.9  Hematocrit 36.0 - 46.0 % 37.4 36.2 38.0  Platelets 150  - 400 K/uL 327 247 -   BMP Latest Ref Rng & Units 11/05/2019 11/04/2019 11/03/2019  Glucose 70 - 99 mg/dL 121(H) 136(H) 160(H)  BUN 8 - 23 mg/dL 6(L) 9 10  Creatinine 0.44 - 1.00 mg/dL 1.07(H) 1.32(H) 1.70(H)  BUN/Creat Ratio 12 - 28 - - -  Sodium 135 - 145 mmol/L 142 142 138  Potassium 3.5 - 5.1 mmol/L 4.9 4.3 3.5  Chloride 98 - 111 mmol/L 110 105 108  CO2 22 - 32 mmol/L 24 26 24   Calcium 8.9 - 10.3 mg/dL 8.9 9.2 8.7(L)   Lab Results  Component Value Date   HGBA1C 7.4 (H) 11/03/2019   Urinalysis    Component Value Date/Time   COLORURINE AMBER (A) 11/04/2019 0028   APPEARANCEUR HAZY (A) 11/04/2019 0028   LABSPEC 1.017 11/04/2019 0028   PHURINE 5.0 11/04/2019 0028   GLUCOSEU NEGATIVE 11/04/2019 0028   HGBUR NEGATIVE 11/04/2019 0028   BILIRUBINUR NEGATIVE 11/04/2019 0028   BILIRUBINUR negative 12/13/2016 1326   KETONESUR NEGATIVE 11/04/2019 0028   PROTEINUR 30 (A) 11/04/2019 0028   UROBILINOGEN 0.2 12/13/2016 1326   NITRITE NEGATIVE 11/04/2019 0028   LEUKOCYTESUR TRACE (A) 11/04/2019 0028   DG Chest 1 View  Result Date: 11/03/2019 CLINICAL DATA:  Pain after fall. Right hip injury previously but no surgery EXAM: CHEST  1 VIEW COMPARISON:  Chest radiograph 10/21/2019 FINDINGS: Stable cardiomediastinal contours. Low lung volumes. Bronchovascular crowding. No focal consolidation. No pneumothorax or large pleural effusion. No acute finding in the visualized skeleton. IMPRESSION: Low volume study.  No acute finding. Electronically Signed   By: Audie Pinto M.D.   On: 11/03/2019 14:36   CT HEAD WO CONTRAST  Result Date: 11/03/2019 CLINICAL DATA:  Recent fall EXAM: CT HEAD WITHOUT CONTRAST TECHNIQUE: Contiguous axial images were obtained from the base of the skull through the vertex without intravenous contrast. COMPARISON:  None. FINDINGS: Brain: Encephalomalacia changes are noted in anterior aspect of the right temporal lobe similar to that seen on prior MRI examination. Mild volume  loss is noted related to prior surgery. No findings to suggest acute hemorrhage, acute infarction or space-occupying mass lesion are noted. Prior lacunar infarct is noted in the subinsular cortex on the left. This is also stable from the prior exam. Vascular: No hyperdense vessel or unexpected calcification. Skull: Postsurgical changes are noted on the right stable from the prior exam. Sinuses/Orbits: Paranasal sinuses again demonstrate a mucosal retention cyst in the right maxillary antrum. Other: None. IMPRESSION: Postoperative changes on  the right. Chronic ischemic changes without acute abnormality. Electronically Signed   By: Inez Catalina M.D.   On: 11/03/2019 22:17   CT Lumbar Spine Wo Contrast  Result Date: 11/03/2019 CLINICAL DATA:  Low back pain.  Trauma. EXAM: CT LUMBAR SPINE WITHOUT CONTRAST TECHNIQUE: Multidetector CT imaging of the lumbar spine was performed without intravenous contrast administration. Multiplanar CT image reconstructions were also generated. COMPARISON:  Abdominopelvic CT 08/19/2019.  Lumbar MRI 07/30/2019. FINDINGS: Segmentation: There are 5 lumbar type vertebral bodies. Alignment: Stable minimal degenerative anterolisthesis at L3-4 and L4-5. Vertebrae: No evidence of acute fracture or traumatic subluxation. Stable chronic endplate sclerosis anteriorly at L4-5. There is advanced multilevel lumbar facet arthropathy. Advanced degenerative changes are present at the sacroiliac joints with subchondral sclerosis and vacuum phenomenon. No evidence of acute fracture or pars defect. Paraspinal and other soft tissues: No acute paraspinal findings. Aortic and branch vessel atherosclerosis, nonobstructing right renal calculus, a cyst in the lower pole of the right kidney and hepatic steatosis are noted. Disc levels: T11-12: Mild vacuum phenomenon and bulging. No significant spinal stenosis. T12-L1: No significant findings. L1-2: The disc appears normal. Mild anterior osteophyte formation and  facet hypertrophy. No spinal stenosis. L2-3: Mild spinal stenosis secondary to disc bulging, facet and ligamentous hypertrophy, similar to previous MRI. Mild narrowing of both foramina and the right lateral recess. L3-4: Vacuum disc phenomenon with annular disc bulging and endplate osteophytes. Advanced facet and ligamentous hypertrophy. There is resulting moderate multifactorial spinal stenosis with mild narrowing of the lateral recesses and foramina. L4-5: Chronic loss of disc height with annular disc bulging, endplate osteophytes and sclerosis. Advanced facet and ligamentous hypertrophy. Grossly stable moderate multifactorial spinal stenosis with moderate narrowing of the lateral recesses and foramina bilaterally. L5-S1: The disc appears normal. Bilateral facet hypertrophy with asymmetric paraspinal osteophytes on the right. No significant spinal stenosis. IMPRESSION: 1. No acute posttraumatic findings within the lumbar spine. 2. Multilevel spondylosis with disc degeneration, endplate osteophytes and facet hypertrophy as described. Moderate multifactorial spinal stenosis at L3-4 and L4-5, similar to previous imaging. Electronically Signed   By: Richardean Sale M.D.   On: 11/03/2019 14:55   DG Chest Port 1 View  Result Date: 10/21/2019 CLINICAL DATA:  Shortness breath EXAM: PORTABLE CHEST 1 VIEW COMPARISON:  June 11, 2019 FINDINGS: The heart size and mediastinal contours are within normal limits. Mildly hazy airspace opacity seen at the right lung base. The visualized skeletal structures are unremarkable. IMPRESSION: Mildly hazy airspace opacity at the right lung base which could be due to atelectasis and/or early infectious etiology. Electronically Signed   By: Prudencio Pair M.D.   On: 10/21/2019 22:19   DG Foot Complete Right  Result Date: 10/23/2019 Please see detailed radiograph report in office note.  DG Hip Unilat W or Wo Pelvis 2-3 Views Right  Result Date: 11/03/2019 CLINICAL DATA:  Pain after  fall. Right hip injury previously but no surgery EXAM: DG HIP (WITH OR WITHOUT PELVIS) 2-3V RIGHT COMPARISON:  CT abdomen pelvis 08/19/2019; pelvis radiographs 09/19/2018 FINDINGS: There is no evidence of hip fracture or dislocation. There is no evidence of arthropathy or other focal bone abnormality. IMPRESSION: Negative radiographs of the right hip. Electronically Signed   By: Audie Pinto M.D.   On: 11/03/2019 14:38   VAS [place] LOWER EXTREMITY VENOUS (DVT) (ONLY [facility] & [facility] 7a-7p)  Result Date: 10/22/2019  Lower Venous DVTStudy Indications: Foot pain x 3 weeks.  Limitations: Body habitus, poor ultrasound/tissue interface and patient mobility. Performing Technologist:  Antonieta Pert RDMS, RVT  Examination Guidelines: A complete evaluation includes B-mode imaging, spectral Doppler, color Doppler, and power Doppler as needed of all accessible portions of each vessel. Bilateral testing is considered an integral part of a complete examination. Limited examinations for reoccurring indications may be performed as noted. The reflux portion of the exam is performed with the patient in reverse Trendelenburg.  +---------+---------------+---------+-----------+----------+--------------+ RIGHT    CompressibilityPhasicitySpontaneityPropertiesThrombus Aging +---------+---------------+---------+-----------+----------+--------------+ CFV      Full           Yes      Yes                                 +---------+---------------+---------+-----------+----------+--------------+ SFJ      Full                                                        +---------+---------------+---------+-----------+----------+--------------+ FV Mid                                                Not visualized +---------+---------------+---------+-----------+----------+--------------+ FV Distal               Yes      Yes                                  +---------+---------------+---------+-----------+----------+--------------+ PFV      Full                                                        +---------+---------------+---------+-----------+----------+--------------+ POP                     Yes      Yes                                 +---------+---------------+---------+-----------+----------+--------------+ PTV                                                   Not visualized +---------+---------------+---------+-----------+----------+--------------+ PERO                                                  Not visualized +---------+---------------+---------+-----------+----------+--------------+ VERY LIMITED EXAM  +----+---------------+---------+-----------+----------+--------------+ LEFTCompressibilityPhasicitySpontaneityPropertiesThrombus Aging +----+---------------+---------+-----------+----------+--------------+ CFV                                              Not visualized +----+---------------+---------+-----------+----------+--------------+     Summary: RIGHT: - There is no  evidence of deep vein thrombosis in the lower extremity. However, portions of this examination were limited- see technologist comments above.  - No cystic structure found in the popliteal fossa.   *See table(s) above for measurements and observations. Electronically signed by Harold Barban MD on 10/22/2019 at 10:33:08 PM.    Final    Discharge Instructions: Discharge Instructions    Call MD for:   Complete by: As directed    Sudden onset chest pain, shortness of breath, increased leg swelling, or weight gain.   Call MD for:  extreme fatigue   Complete by: As directed    Call MD for:  persistant dizziness or light-headedness   Complete by: As directed    Call MD for:  persistant nausea and vomiting   Complete by: As directed    Call MD for:  temperature >100.4   Complete by: As directed    Diet - low sodium heart healthy   Complete  by: As directed    Discharge instructions   Complete by: As directed    Alisha [name],  You were seen in the hospital after a fall. It is likely that you were feeling weak and your blood pressure dropped too low from all your medications, both of which put you at risk for falling. Please work with physical therapy at your nursing facility to improve your strength to prevent additional falls in the future.  Please decrease the dose of your amitriptyline medication to 50mg  daily and resume taking lasix 40mg  daily. Continue to assess your weight's daily to determine if you need any medication adjustments of your lasix.   Follow-up with your primary care provider to review all your medications and stop those which might no longer bring your benefit. Additionally, your primary provider can follow-up your right leg swelling and get your referred to a vascular doctor.  Thank you for letting us be a part of your care!   Increase activity slowly   Complete by: As directed       Signed: Ladona Horns, MD 11/06/2019, 1:45 PM   Pager: [phone]

## 2019-11-06 NOTE — TOC Transition Note (Signed)
Transition of Care [facility]) - CM/SW Discharge Note   Patient Details  Name: Alisha [name] MRN: [id number] Date of Birth: [birth year]  Transition of Care [facility]) CM/SW Contact:  Alberteen Sam, LCSW Phone Number: 11/06/2019, 2:36 PM   Clinical Narrative:     Patient will DC to: [facility] Anticipated DC date: 11/06/19 Family notified:n/a Transport [id number]  Per MD patient ready for DC to [facility] . RN, patient, patient's family, and facility notified of DC. Discharge Summary sent to facility. RN given number for report  [phone] Room 3206. DC packet on chart. Ambulance transport requested for patient.  CSW signing off.  [place], [place]   Final next level of care: Skilled Nursing Facility Barriers to Discharge: No Barriers Identified   Patient Goals and CMS Choice Patient states their goals for this hospitalization and ongoing recovery are:: to go to rehab then home CMS Medicare.gov Compare Post Acute Care list provided to:: Patient Choice offered to / list presented to : Patient  Discharge Placement PASRR number recieved: 11/05/19            Patient chooses bed at: [facility] Patient to be transferred to facility by: [place] Name of family member notified: n/a Patient and family notified of of transfer: 11/06/19  Discharge Plan and Services     Post Acute Care Choice: [place]                               Social Determinants of Health (SDOH) Interventions     Readmission Risk Interventions No flowsheet data found.

## 2019-11-06 NOTE — Progress Notes (Signed)
  Mobility Specialist Criteria Algorithm Info.  SATURATION QUALIFICATIONS: (This note is used to comply with regulatory documentation for home oxygen)  Patient Saturations on Room Air at Rest = 100%  Patient Saturations on Room Air while Ambulating = 99%  Patient Saturations on n/a Liters of oxygen while Ambulating = n/a%  Please briefly explain why patient needs home oxygen:  Mobility Team:  [facility] elevated:Self regulated Activity: Ambulated in hall;Transferred:  Bed to chair (to recliner chair after ambulation) Range of motion: Active;All extremities Level of assistance: Minimal assist, patient does 75% or more (Min A sit/stand; Contact Min G +2 chair follow ambulating) Assistive device: Front wheel walker Minutes sitting in chair:  Minutes stood: 8 minutes Minutes ambulated: 8 minutes Distance ambulated (ft): 280 ft Mobility response: Tolerated fair;RN notified (LOB and legs buckling x2 ) Bed Position: Chair (Recliner )  Pt was eager and willing to participate in mobility. Was able to go from supine to sit with minimal HHA and NO assistance to move RLE to EOB. Pt had a LOB x1/legs buckling x2 that required mod A to steady and seated rest. Declined dizziness or lightheadedness, only mentioning pain in the proximal area of the RLE. Although pt legs buckled, she declined using wheelchair to return back to the room and ambulated without incident. Was dyspneic with ambulation but subsides quickly with seated rest.    11/06/2019 2:36 PM

## 2019-11-06 NOTE — TOC Progression Note (Signed)
Transition of Care [facility]) - Progression Note    Patient Details  Name: Alisha [name] MRN: [id number] Date of Birth: 13-Oct-1950  Transition of Care [facility]) CM/SW [place], [place] Phone Number: 11/06/2019, 3:21 PM  Clinical Narrative:     CSW received call from patient's daughter [name]. CSW called patient first to get verbal permission for daughter to be informed of dc plan, then called [facility] back to update regarding patient's dc plan today to [facility]. All concerns and questions addressed at this time. CSW has reached out to [facility] admissions to call Valtracy as well to answer any facility related questions.   Expected Discharge Plan: [place] Barriers to Discharge: No Barriers Identified  Expected Discharge Plan and Services Expected Discharge Plan: [place] Choice: [place] arrangements for the past 2 months: Single Family Home Expected Discharge Date: 11/06/19                                     Social Determinants of Health (SDOH) Interventions    Readmission Risk Interventions No flowsheet data found.

## 2019-11-06 NOTE — TOC Progression Note (Signed)
Transition of Care [facility]) - Progression Note    Patient Details  Name: Alisha [name] MRN: [id number] Date of Birth: 11-26-1950  Transition of Care [facility]) CM/SW [place], [place] Phone Number: 11/06/2019, 10:14 AM  Clinical Narrative:     CSW spoke with patient at bedside. Informed patient of accepted bed offers. Patient agreed for SNF placement at [facility].   Insurance authorization initiated today.    Expected Discharge Plan: [place] Barriers to Discharge: Continued Medical Work up  Expected Discharge Plan and Services Expected Discharge Plan: [place] Choice: [place] arrangements for the past 2 months: Single Family Home                                       Social Determinants of Health (SDOH) Interventions    Readmission Risk Interventions No flowsheet data found.

## 2019-11-06 NOTE — Plan of Care (Signed)
  Problem: Activity: Goal: Risk for activity intolerance will decrease Outcome: Progressing   Problem: Elimination: Goal: Will not experience complications related to urinary retention Outcome: Progressing   Problem: Safety: Goal: Ability to remain free from injury will improve Outcome: Progressing   

## 2019-11-06 NOTE — Progress Notes (Signed)
Physical Therapy Treatment Patient Details Name: Alisha [name] MRN: [id number] DOB: [birth year] Today's Date: 11/06/2019    History of Present Illness 69 y.o female with Obesity, HTN, Asthma, Chronic venous insufficiency, Depression, and Polypharmacy who presented to the ED after a fall.    PT Comments    Patient seen for mobility progression. Continue to progress as tolerated with anticipated d/c to SNF for further skilled PT services.     Follow Up Recommendations  SNF;Other (comment)(hx multi falls and debility, would benefit from rehab)     Equipment Recommendations  None recommended by PT    Recommendations for Other Services       Precautions / Restrictions Precautions Precautions: Fall Restrictions Weight Bearing Restrictions: No    Mobility  Bed Mobility Overal bed mobility: Modified Independent Bed Mobility: Supine to Sit           General bed mobility comments: use of rails  Transfers Overall transfer level: Needs assistance Equipment used: Rolling walker (2 wheeled) Transfers: Sit to/from Stand Sit to Stand: Min assist;From elevated surface         General transfer comment: cues for safe hand placement and assist to power up into standing   Ambulation/Gait Ambulation/Gait assistance: +2 safety/equipment;Min guard;Min assist(chair follow) Gait Distance (Feet): (50 ft X 4 trials and rest breaks due to fatigue/DOE) Assistive device: Rolling walker (2 wheeled) Gait Pattern/deviations: Step-through pattern;Decreased stride length;Decreased stance time - right;Decreased step length - right;Decreased weight shift to right;Trunk flexed;Wide base of support Gait velocity: decreased    General Gait Details: cues for safe use of AD as pt leaning on forearms and declining seated breaks despite chair follow; pt with 2/4 DOE   Stairs             Wheelchair Mobility    Modified Rankin (Stroke Patients Only)       Balance Overall balance  assessment: Needs assistance;History of Falls Sitting-balance support: Feet supported;No upper extremity supported Sitting balance-Leahy Scale: Good     Standing balance support: Bilateral upper extremity supported;During functional activity Standing balance-Leahy Scale: Poor                              Cognition Arousal/Alertness: Awake/alert Behavior During Therapy: WFL for tasks assessed/performed Overall Cognitive Status: Impaired/Different from baseline Area of Impairment: Memory;Safety/judgement                     Memory: Decreased short-term memory   Safety/Judgement: Decreased awareness of safety;Decreased awareness of deficits            Exercises      General Comments General comments (skin integrity, edema, etc.): no R knee buckling noted this session but has been happening lately       Pertinent Vitals/Pain Pain Assessment: Faces Faces Pain Scale: Hurts little more Pain Location: R LE Pain Descriptors / Indicators: Guarding;Grimacing;Sore Pain Intervention(s): Monitored during session;Repositioned    Home Living                      Prior Function            PT Goals (current goals can now be found in the care plan section) Progress towards PT goals: Progressing toward goals    Frequency    Min 2X/week      PT Plan Current plan remains appropriate    Co-evaluation  AM-PAC PT "6 Clicks" Mobility   Outcome Measure  Help needed turning from your back to your side while in a flat bed without using bedrails?: A Little Help needed moving from lying on your back to sitting on the side of a flat bed without using bedrails?: A Little Help needed moving to and from a bed to a chair (including a wheelchair)?: A Little Help needed standing up from a chair using your arms (e.g., wheelchair or bedside chair)?: A Little Help needed to walk in hospital room?: A Little Help needed climbing 3-5 steps with a  railing? : A Lot 6 Click Score: 17    End of Session Equipment Utilized During Treatment: Gait belt Activity Tolerance: Patient tolerated treatment well Patient left: with call bell/phone within reach;in chair;with chair alarm set Nurse Communication: Mobility status PT Visit Diagnosis: Unsteadiness on feet (R26.81);Repeated falls (R29.6);Other abnormalities of gait and mobility (R26.89);Muscle weakness (generalized) (M62.81);History of falling (Z91.81)     Time: 1330-1411 PT Time Calculation (min) (ACUTE ONLY): 41 min  Charges:  $Gait Training: 23-37 mins $Therapeutic Activity: 8-22 mins                     Earney Navy, PTA Acute Rehabilitation Services Pager: [phone] Office: [phone]     Darliss Cheney 11/06/2019, 5:36 PM

## 2019-11-06 NOTE — Progress Notes (Signed)
   Subjective: Pt seen at the bedside this morning. States she is feeling well. Endorses some continued groin pain, but has not yet taken anything for it. Denies RLE pain, weakness, or increased swelling. No acute concerns at this time. Pt understands that she is waiting on SNF placement.  Objective:  Vital signs in last 24 hours: Vitals:   11/05/19 1159 11/05/19 1648 11/05/19 2018 11/06/19 0404  BP: (!) 146/79 (!) 145/83 (!) 155/65 (!) 147/82  Pulse: (!) 48 85 84 65  Resp: 20 20 20 20   Temp: 98.4 F (36.9 C) 98.3 F (36.8 C) 99.6 F (37.6 C) (!) 97.4 F (36.3 C)  TempSrc: Oral Oral Oral Oral  SpO2: 95% 100% 93%   Weight:    (!) 138.2 kg  Height:       Physical Exam Vitals and nursing note reviewed.  Constitutional:      General: She is not in acute distress.    Appearance: She is obese. She is not ill-appearing.     Comments: Pt sitting up at the side of the bed eating breakfast.  Pulmonary:     Effort: Pulmonary effort is normal. No respiratory distress.     Breath sounds: Normal breath sounds.  Musculoskeletal:     Comments: Stable LE edema (R >> L)  Skin:    General: Skin is warm and dry.     Comments: No overlaying skins changes on the lower extremities.  Neurological:     Mental Status: She is alert.    Assessment/Plan:  Active Problems:   AKI (acute kidney injury) ([place])  Ms. Markham is a 69 year old F with significant PMH of obesity, polypharmacy, recurrent falls, hypertension, chronic venous insufficiency, osteoarthritis, chronic pain/fibromyalgia, asthma/OSA, and bilateral knee replacements, who presents after a mechanical fall in the setting of bilateral LE edema and hypotension.  Fall Pt has a documented history of recurrent falls for past year. Most recent episode prior to admission likely secondary to polypharmacy, hypotension from recent increase in diuresis, and deconditioning. Holding home anti-hypertensives, opioids, muscle relaxants, and diuretics. -  PT/OT recommending SNF placement - TOC placed referrals, bed offers pending - pt to continue to work with PT/OT acutely  Unilateral edema of the R leg:  Venous stasis of the BLE: Remotehx of DVT: LE dopplers from 2/23 negative for DVT. Increased outpatient diuretic did not improve her symptoms. Pt on many medications with reported side effects of edema, chiefly gabapentin and amitriptyline.  - vascular outpatient referral in process for venous stasis vs lymphedema per 2/26 outpatient PCP note - will defer additional work-up as above to outpatient PCP - would recommend weaning off gabapentin and discontinuing amitriptyline  AKI - improving Cr on admission 1.7 (1.3 two weeks prior and 0.65 4 months ago). - suspect secondary to intravascular depletion secondary to increased diuretics and hypotension at home - follow-up AM BMP tomorrow  Prior to Admission Living Arrangement: home Anticipated Discharge Location: SNF Barriers to Discharge: placement Dispo: Pt is medically stable for discharge. Anticipated discharge pending SNF placement.   Ladona Horns, MD 11/06/2019, 6:22 AM Pager: [phone]

## 2019-11-13 ENCOUNTER — Other Ambulatory Visit: Payer: Self-pay | Admitting: Family

## 2019-12-04 ENCOUNTER — Emergency Department (HOSPITAL_COMMUNITY): Payer: Medicare Other

## 2019-12-04 ENCOUNTER — Other Ambulatory Visit: Payer: Self-pay

## 2019-12-04 ENCOUNTER — Encounter (HOSPITAL_COMMUNITY): Payer: Self-pay | Admitting: Emergency Medicine

## 2019-12-04 ENCOUNTER — Inpatient Hospital Stay (HOSPITAL_COMMUNITY): Payer: Medicare Other

## 2019-12-04 ENCOUNTER — Inpatient Hospital Stay (HOSPITAL_COMMUNITY)
Admission: EM | Admit: 2019-12-04 | Discharge: 2019-12-11 | DRG: 682 | Disposition: A | Discharge status: Skilled Nursing Facility | Payer: Medicare Other | Source: Skilled Nursing Facility | Attending: Internal Medicine | Admitting: Internal Medicine

## 2019-12-04 DIAGNOSIS — G8929 Other chronic pain: Secondary | ICD-10-CM | POA: Diagnosis not present

## 2019-12-04 DIAGNOSIS — I639 Cerebral infarction, unspecified: Secondary | ICD-10-CM | POA: Diagnosis present

## 2019-12-04 DIAGNOSIS — Z7902 Long term (current) use of antithrombotics/antiplatelets: Secondary | ICD-10-CM

## 2019-12-04 DIAGNOSIS — M797 Fibromyalgia: Secondary | ICD-10-CM | POA: Diagnosis present

## 2019-12-04 DIAGNOSIS — G9341 Metabolic encephalopathy: Secondary | ICD-10-CM | POA: Diagnosis present

## 2019-12-04 DIAGNOSIS — R4182 Altered mental status, unspecified: Secondary | ICD-10-CM | POA: Diagnosis not present

## 2019-12-04 DIAGNOSIS — Z9861 Coronary angioplasty status: Secondary | ICD-10-CM

## 2019-12-04 DIAGNOSIS — E860 Dehydration: Secondary | ICD-10-CM | POA: Diagnosis present

## 2019-12-04 DIAGNOSIS — F3130 Bipolar disorder, current episode depressed, mild or moderate severity, unspecified: Secondary | ICD-10-CM | POA: Diagnosis present

## 2019-12-04 DIAGNOSIS — Z91011 Allergy to milk products: Secondary | ICD-10-CM

## 2019-12-04 DIAGNOSIS — Z91012 Allergy to eggs: Secondary | ICD-10-CM

## 2019-12-04 DIAGNOSIS — T502X5A Adverse effect of carbonic-anhydrase inhibitors, benzothiadiazides and other diuretics, initial encounter: Secondary | ICD-10-CM | POA: Diagnosis present

## 2019-12-04 DIAGNOSIS — I10 Essential (primary) hypertension: Secondary | ICD-10-CM | POA: Diagnosis present

## 2019-12-04 DIAGNOSIS — N179 Acute kidney failure, unspecified: Secondary | ICD-10-CM | POA: Diagnosis present

## 2019-12-04 DIAGNOSIS — Z8249 Family history of ischemic heart disease and other diseases of the circulatory system: Secondary | ICD-10-CM

## 2019-12-04 DIAGNOSIS — K219 Gastro-esophageal reflux disease without esophagitis: Secondary | ICD-10-CM | POA: Diagnosis present

## 2019-12-04 DIAGNOSIS — R296 Repeated falls: Secondary | ICD-10-CM | POA: Diagnosis present

## 2019-12-04 DIAGNOSIS — E785 Hyperlipidemia, unspecified: Secondary | ICD-10-CM | POA: Diagnosis present

## 2019-12-04 DIAGNOSIS — Z7982 Long term (current) use of aspirin: Secondary | ICD-10-CM

## 2019-12-04 DIAGNOSIS — M5431 Sciatica, right side: Secondary | ICD-10-CM | POA: Diagnosis present

## 2019-12-04 DIAGNOSIS — Z9981 Dependence on supplemental oxygen: Secondary | ICD-10-CM

## 2019-12-04 DIAGNOSIS — G4733 Obstructive sleep apnea (adult) (pediatric): Secondary | ICD-10-CM | POA: Diagnosis present

## 2019-12-04 DIAGNOSIS — Z20822 Contact with and (suspected) exposure to covid-19: Secondary | ICD-10-CM | POA: Diagnosis present

## 2019-12-04 DIAGNOSIS — I2510 Atherosclerotic heart disease of native coronary artery without angina pectoris: Secondary | ICD-10-CM | POA: Diagnosis present

## 2019-12-04 DIAGNOSIS — I739 Peripheral vascular disease, unspecified: Secondary | ICD-10-CM | POA: Diagnosis present

## 2019-12-04 DIAGNOSIS — R29707 NIHSS score 7: Secondary | ICD-10-CM | POA: Diagnosis present

## 2019-12-04 DIAGNOSIS — R471 Dysarthria and anarthria: Secondary | ICD-10-CM | POA: Diagnosis not present

## 2019-12-04 DIAGNOSIS — M1990 Unspecified osteoarthritis, unspecified site: Secondary | ICD-10-CM | POA: Diagnosis present

## 2019-12-04 DIAGNOSIS — R339 Retention of urine, unspecified: Secondary | ICD-10-CM | POA: Diagnosis not present

## 2019-12-04 DIAGNOSIS — R52 Pain, unspecified: Secondary | ICD-10-CM

## 2019-12-04 DIAGNOSIS — I959 Hypotension, unspecified: Secondary | ICD-10-CM | POA: Diagnosis not present

## 2019-12-04 DIAGNOSIS — R1904 Left lower quadrant abdominal swelling, mass and lump: Secondary | ICD-10-CM | POA: Diagnosis not present

## 2019-12-04 DIAGNOSIS — Z880 Allergy status to penicillin: Secondary | ICD-10-CM

## 2019-12-04 DIAGNOSIS — Z6841 Body Mass Index (BMI) 40.0 and over, adult: Secondary | ICD-10-CM | POA: Diagnosis not present

## 2019-12-04 DIAGNOSIS — Z888 Allergy status to other drugs, medicaments and biological substances status: Secondary | ICD-10-CM

## 2019-12-04 DIAGNOSIS — G894 Chronic pain syndrome: Secondary | ICD-10-CM | POA: Diagnosis present

## 2019-12-04 DIAGNOSIS — Z7951 Long term (current) use of inhaled steroids: Secondary | ICD-10-CM

## 2019-12-04 DIAGNOSIS — I63511 Cerebral infarction due to unspecified occlusion or stenosis of right middle cerebral artery: Secondary | ICD-10-CM | POA: Diagnosis not present

## 2019-12-04 DIAGNOSIS — J45909 Unspecified asthma, uncomplicated: Secondary | ICD-10-CM | POA: Diagnosis present

## 2019-12-04 DIAGNOSIS — Z86718 Personal history of other venous thrombosis and embolism: Secondary | ICD-10-CM

## 2019-12-04 DIAGNOSIS — I361 Nonrheumatic tricuspid (valve) insufficiency: Secondary | ICD-10-CM | POA: Diagnosis not present

## 2019-12-04 DIAGNOSIS — Z7984 Long term (current) use of oral hypoglycemic drugs: Secondary | ICD-10-CM

## 2019-12-04 DIAGNOSIS — F319 Bipolar disorder, unspecified: Secondary | ICD-10-CM | POA: Diagnosis not present

## 2019-12-04 DIAGNOSIS — Z79899 Other long term (current) drug therapy: Secondary | ICD-10-CM

## 2019-12-04 DIAGNOSIS — Z8701 Personal history of pneumonia (recurrent): Secondary | ICD-10-CM

## 2019-12-04 DIAGNOSIS — I872 Venous insufficiency (chronic) (peripheral): Secondary | ICD-10-CM | POA: Diagnosis present

## 2019-12-04 DIAGNOSIS — Z881 Allergy status to other antibiotic agents status: Secondary | ICD-10-CM

## 2019-12-04 DIAGNOSIS — Z87442 Personal history of urinary calculi: Secondary | ICD-10-CM

## 2019-12-04 DIAGNOSIS — Z981 Arthrodesis status: Secondary | ICD-10-CM

## 2019-12-04 LAB — BRAIN NATRIURETIC PEPTIDE: B Natriuretic Peptide: 16.4 pg/mL (ref 0.0–100.0)

## 2019-12-04 LAB — COMPREHENSIVE METABOLIC PANEL
ALT: 35 U/L (ref 0–44)
AST: 36 U/L (ref 15–41)
Albumin: 3.5 g/dL (ref 3.5–5.0)
Alkaline Phosphatase: 95 U/L (ref 38–126)
Anion gap: 15 (ref 5–15)
BUN: 63 mg/dL — ABNORMAL HIGH (ref 8–23)
CO2: 25 mmol/L (ref 22–32)
Calcium: 9.6 mg/dL (ref 8.9–10.3)
Chloride: 100 mmol/L (ref 98–111)
Creatinine, Ser: 3.81 mg/dL — ABNORMAL HIGH (ref 0.44–1.00)
GFR calc Af Amer: 13 mL/min — ABNORMAL LOW (ref >60)
GFR calc non Af Amer: 11 mL/min — ABNORMAL LOW (ref >60)
Glucose, Bld: 84 mg/dL (ref 70–99)
Potassium: 3.6 mmol/L (ref 3.5–5.1)
Sodium: 140 mmol/L (ref 135–145)
Total Bilirubin: 0.8 mg/dL (ref 0.3–1.2)
Total Protein: 7.7 g/dL (ref 6.5–8.1)

## 2019-12-04 LAB — CBC
HCT: 37.8 % (ref 36.0–46.0)
Hemoglobin: 12 g/dL (ref 12.0–15.0)
MCH: 28.2 pg (ref 26.0–34.0)
MCHC: 31.7 g/dL (ref 30.0–36.0)
MCV: 88.9 fL (ref 80.0–100.0)
Platelets: 261 K/uL (ref 150–400)
RBC: 4.25 MIL/uL (ref 3.87–5.11)
RDW: 14.1 % (ref 11.5–15.5)
WBC: 5.4 K/uL (ref 4.0–10.5)
nRBC: 0 % (ref 0.0–0.2)

## 2019-12-04 LAB — URINALYSIS, ROUTINE W REFLEX MICROSCOPIC
Bilirubin Urine: NEGATIVE
Glucose, UA: NEGATIVE mg/dL
Hgb urine dipstick: NEGATIVE
Ketones, ur: NEGATIVE mg/dL
Leukocytes,Ua: NEGATIVE
Nitrite: NEGATIVE
Protein, ur: NEGATIVE mg/dL
Specific Gravity, Urine: 1.012 (ref 1.005–1.030)
pH: 5 (ref 5.0–8.0)

## 2019-12-04 LAB — LIPASE, BLOOD: Lipase: 29 U/L (ref 11–51)

## 2019-12-04 LAB — SODIUM, URINE, RANDOM: Sodium, Ur: 15 mmol/L

## 2019-12-04 LAB — CREATININE, URINE, RANDOM: Creatinine, Urine: 158.59 mg/dL

## 2019-12-04 MED ORDER — HEPARIN SODIUM (PORCINE) 5000 UNIT/ML IJ SOLN
5000.0000 [IU] | Freq: Three times a day (TID) | INTRAMUSCULAR | Status: DC
  Administered 2019-12-04 – 2019-12-11 (×20): 5000 [IU] via SUBCUTANEOUS
  Filled 2019-12-04 (×19): qty 1

## 2019-12-04 MED ORDER — ASPIRIN EC 81 MG PO TBEC
81.0000 mg | DELAYED_RELEASE_TABLET | Freq: Every day | ORAL | Status: DC
  Administered 2019-12-05 – 2019-12-11 (×7): 81 mg via ORAL
  Filled 2019-12-04 (×7): qty 1

## 2019-12-04 MED ORDER — GABAPENTIN 300 MG PO CAPS
300.0000 mg | ORAL_CAPSULE | Freq: Three times a day (TID) | ORAL | Status: DC
  Administered 2019-12-05 – 2019-12-06 (×4): 300 mg via ORAL
  Filled 2019-12-04: qty 3
  Filled 2019-12-04 (×3): qty 1

## 2019-12-04 MED ORDER — LACTATED RINGERS IV BOLUS
1000.0000 mL | Freq: Once | INTRAVENOUS | Status: AC
  Administered 2019-12-04: 1000 mL via INTRAVENOUS

## 2019-12-04 MED ORDER — LACTATED RINGERS IV SOLN: INTRAVENOUS | Status: AC

## 2019-12-04 MED ORDER — LORATADINE 10 MG PO TABS
10.0000 mg | ORAL_TABLET | Freq: Every day | ORAL | Status: DC
  Administered 2019-12-04 – 2019-12-11 (×8): 10 mg via ORAL
  Filled 2019-12-04 (×8): qty 1

## 2019-12-04 MED ORDER — SODIUM CHLORIDE 0.9 % IV BOLUS
500.0000 mL | Freq: Once | INTRAVENOUS | Status: DC
  Administered 2019-12-04: 23:00:00 500 mL via INTRAVENOUS

## 2019-12-04 MED ORDER — ALBUTEROL SULFATE (2.5 MG/3ML) 0.083% IN NEBU
2.5000 mg | INHALATION_SOLUTION | Freq: Four times a day (QID) | RESPIRATORY_TRACT | Status: DC | PRN
  Administered 2019-12-05 – 2019-12-08 (×3): 2.5 mg via RESPIRATORY_TRACT
  Filled 2019-12-04 (×3): qty 3

## 2019-12-04 MED ORDER — PANTOPRAZOLE SODIUM 40 MG PO TBEC
40.0000 mg | DELAYED_RELEASE_TABLET | Freq: Two times a day (BID) | ORAL | Status: DC
  Administered 2019-12-05 – 2019-12-11 (×13): 40 mg via ORAL
  Filled 2019-12-04 (×13): qty 1

## 2019-12-04 MED ORDER — ATORVASTATIN CALCIUM 80 MG PO TABS
80.0000 mg | ORAL_TABLET | Freq: Every day | ORAL | Status: DC
  Administered 2019-12-05 – 2019-12-10 (×6): 80 mg via ORAL
  Filled 2019-12-04 (×6): qty 1

## 2019-12-04 MED ORDER — UMECLIDINIUM BROMIDE 62.5 MCG/INH IN AEPB
2.0000 | INHALATION_SPRAY | Freq: Every day | RESPIRATORY_TRACT | Status: DC
  Administered 2019-12-05 – 2019-12-09 (×2): 2 via RESPIRATORY_TRACT
  Filled 2019-12-04: qty 7

## 2019-12-04 MED ORDER — CLOPIDOGREL BISULFATE 75 MG PO TABS
75.0000 mg | ORAL_TABLET | Freq: Every day | ORAL | Status: DC
  Administered 2019-12-05: 10:00:00 75 mg via ORAL
  Filled 2019-12-04: qty 1

## 2019-12-04 NOTE — ED Provider Notes (Signed)
[facility] EMERGENCY DEPARTMENT Provider Note   CSN: [id number] Arrival date & time: 12/04/19  1550     History No chief complaint on file.   Alisha [name] is a 69 y.o. female.  HPI 69 year old pleasant female with an extensive medical history including hypertension, history of DVT, coronary artery disease, nephrolithiasis, AKI presents from SNF via EMS for reported acute kidney injury.  History obtained from EMS and the patient, though the patient is a poor historian.  EMS refers that they were called after the SNF reported that she has been having increasing confusion and hallucinations over the last few days.  They did some basic lab work which showed acute kidney injury, and thus EMS was called.  Records are not available; per EMS, the clinical team was out to lunch when they came to pick her up.  Patient is able to recall some elements of her medical history including that she has a history of kidney stones, and has "asthma", reports she is on 12 to 14 L of oxygen at home, but EMS states that they believe it is 2 L.  She also refers that she has been having some watery diarrhea over the last few days, but no urinary symptoms, no chest pain, no shortness of breath, no dizziness, syncope, or recent falls.  States that her legs are fairly chronically swollen, and she has not noticed any increase in swelling.  She denies starting any new medications.    Past Medical History:  Diagnosis Date  . Arthritis   . Asthma   . Bipolar disorder ([place])   . CAD (coronary artery disease) 07/07/2016   a. s/p stenting x 2 in [place] to the LAD, RCA is chronically occluded, LVEDP was 34  . Cancer (H[facility])    cervical  . Depression   . Fibromyalgia   . GERD (gastroesophageal reflux disease)   . History of benign pituitary tumor   . History of kidney stones    1972  . Hyperlipidemia   . Hypertension   . Migraine   . Morbid obesity ([place])   . Peripheral vascular disease ([place])   .  Pneumonia   . Seizures ([place])    during surgery for pituitary tumor  . Sleep apnea    with cpap  . Vertigo     Patient Active Problem List   Diagnosis Date Noted  . AKI (acute kidney injury) ([place]) 11/03/2019  . Recurrent falls 09/22/2018  . Chronic use of opiate drug for therapeutic purpose 04/11/2018  . Bradycardia 04/11/2018  . Chronic fatigue 04/11/2018  . Prediabetes 12/17/2017  . Mild cognitive impairment 12/13/2017  . Cough variant asthma vs UACS 10/26/2017  . Upper airway cough syndrome 10/25/2017  . Medial epicondylitis, right 09/16/2017  . Right shoulder pain 09/07/2017  . Lateral epicondylitis of right elbow 09/07/2017  . Vitamin D deficiency 07/13/2017  . History of foot fracture 07/12/2017  . History of colon polyps 07/12/2017  . Cataract of both eyes 07/11/2017  . Hypertensive retinopathy of both eyes 07/11/2017  . Fibromyalgia 05/17/2017  . Tenosynovitis, wrist 05/14/2017  . Chronic cough 05/02/2017  . Foot pain, left 05/02/2017  . Dysphagia 03/23/2017  . Health care maintenance 03/23/2017  . Coronary artery disease involving native coronary artery of native heart without angina pectoris 01/11/2017  . Hyperlipidemia 01/11/2017  . Polypharmacy 11/25/2016  . Venous stasis of both lower extremities 10/04/2016  . Poor dentition 10/04/2016  . OSA (obstructive sleep apnea) 10/04/2016  . History of  nephrolithiasis 10/04/2016  . Hx of pilonidal cyst 10/04/2016  . Bipolar depression ([place]) 10/04/2016  . Personal history of DVT (deep vein thrombosis) 10/04/2016  . Morbid obesity due to excess calories ([place]) 09/25/2016  . History of pituitary adenoma s/p resection 09/25/2016  . Essential hypertension 09/25/2016  . Psoriatic arthritis ([place]) 09/25/2016  . Vision impairment 09/25/2016  . Chronic seasonal allergic rhinitis due to pollen 09/25/2016  . Moderate persistent asthma without complication [id number]  . Gastroesophageal reflux disease without esophagitis  09/25/2016  . Urge incontinence 09/25/2016  . Chronic pain syndrome 09/25/2016  . Candidal intertrigo 05/10/2010    Past Surgical History:  Procedure Laterality Date  . ANKLE FUSION Bilateral   . carpel tunnel release Bilateral 2017  . CORONARY ANGIOPLASTY    . ESOPHAGOGASTRODUODENOSCOPY (EGD) WITH PROPOFOL N/A 06/14/2017   Procedure: ESOPHAGOGASTRODUODENOSCOPY (EGD) WITH PROPOFOL;  Surgeon: Wilford Corner, MD;  Location: [facility];  Service: Endoscopy;  Laterality: N/A;  . EXCISION / CURETTAGE BONE CYST PHALANGES OF FOOT  2014   Removal of foot cyst   . PARTIAL HYSTERECTOMY  unknown   Patient still has ovaries  . pituitary tumor removal  1999   removed as much as they could, on optic nerve  . TONSILLECTOMY    . TOTAL KNEE ARTHROPLASTY     TKR X 3  2 on the left and 1 on the right     OB History   No obstetric history on file.     Family History  Problem Relation Age of Onset  . Heart disease Mother 72       CABG, pacemaker, valve  . Cancer Father   . Heart disease Sister        Fluid around the heart  . Breast cancer Neg Hx     Social History   Tobacco Use  . Smoking status: Never Smoker  . Smokeless tobacco: Never Used  Substance Use Topics  . Alcohol use: Yes    Comment: Socially. [name].  . Drug use: No    Home Medications Prior to Admission medications   Medication Sig Start Date End Date Taking? Authorizing Provider  albuterol (PROVENTIL) (2.5 MG/3ML) 0.083% nebulizer solution Take 3 mLs (2.5 mg total) by nebulization every 6 (six) hours as needed for wheezing or shortness of breath. Patient not taking: Reported on 10/22/2019 11/10/16   Briscoe Deutscher, DO  albuterol (VENTOLIN HFA) 108 (90 Base) MCG/ACT inhaler Inhale 1 puff into the lungs every 6 (six) hours as needed for wheezing or shortness of breath.    [provider]  amitriptyline (ELAVIL) 50 MG tablet Take 1 tablet (50 mg total) by mouth at bedtime. 11/06/19   Ladona Horns, MD   aspirin EC 81 MG tablet Take 81 mg by mouth daily.    [provider]  atorvastatin (LIPITOR) 80 MG tablet Take 80 mg by mouth at bedtime.     [provider]  budesonide-formoterol (SYMBICORT) 80-4.5 MCG/ACT inhaler Inhale 2 puffs into the lungs 2 (two) times daily. Patient not taking: Reported on 11/03/2019 01/28/18   Lucious Groves, DO  clopidogrel (PLAVIX) 75 MG tablet Take 1 tablet (75 mg total) by mouth daily. 10/18/18   Pieter Partridge, DO  cyclobenzaprine (FLEXERIL) 5 MG tablet Take 1 tablet (5 mg total) by mouth 3 (three) times daily as needed for muscle spasms. Patient not taking: Reported on 10/22/2019 12/10/18   Drenda Freeze, MD  diclofenac (CATAFLAM) 50 MG tablet TAKE 1 TABLET BY MOUTH  3 TIMES A DAY Patient not taking: Reported on 10/22/2019 04/16/19   Pieter Partridge, DO  DULoxetine (CYMBALTA) 60 MG capsule Take 1 capsule (60 mg total) by mouth daily. Patient not taking: Reported on 10/22/2019 05/24/17 10/21/28  Lucious Groves, DO  flurbiprofen (ANSAID) 50 MG tablet Take 1 tablet every 8 hours as needed, maximum 3 tablets/24 hours Patient not taking: Reported on 10/22/2019 09/16/18   Pieter Partridge, DO  furosemide (LASIX) 40 MG tablet Take 1 tablet (40 mg total) by mouth daily. 10/22/19   Veryl Speak, MD  gabapentin (NEURONTIN) 300 MG capsule Take 300 mg by mouth 3 (three) times daily. 10/07/19   [provider]  loratadine (CLARITIN) 10 MG tablet Take 10 mg by mouth daily.    [provider]  losartan (COZAAR) 100 MG tablet Take 1 tablet (100 mg total) daily by mouth. Patient not taking: Reported on 11/03/2019 07/06/17   Lucious Groves, DO  metFORMIN (GLUCOPHAGE) 500 MG tablet Take 500 mg by mouth 2 (two) times daily with a meal.    [provider]  mirabegron ER (MYRBETRIQ) 50 MG TB24 tablet Take 50 mg by mouth daily.    [provider]  oxybutynin (DITROPAN XL) 15 MG 24 hr tablet Take 15 mg by mouth daily. 11/19/18   [provider]  pantoprazole (PROTONIX) 40 MG tablet Take 40 mg by mouth 2 (two) times daily. 10/07/19   [provider]  predniSONE (DELTASONE) 20 MG tablet Take 60 mg daily x 2 days then 40 mg daily x 2 days then 20 mg daily x 2 days Patient not taking: Reported on 10/22/2019 12/10/18   Drenda Freeze, MD  SPIRIVA RESPIMAT 1.25 MCG/ACT AERS Inhale 2 puffs into the lungs daily.  10/07/19   [provider]  tiZANidine (ZANAFLEX) 4 MG tablet Take 1.5 tablets (6 mg total) by mouth daily as needed for muscle spasms. 11/06/19   Ladona Horns, MD  topiramate (TOPAMAX) 50 MG tablet Take 50 mg by mouth at bedtime.    [provider]    Allergies    Ibuprofen, Ampicillin, Asa [aspirin], Darvon [propoxyphene], Demeclocycline, Eggs or egg-derived products, Lactalbumin, Lisinopril, Milk-related compounds, Salicylates, Tetracycline hcl, and Tetracyclines & related  Review of Systems   Review of Systems  Constitutional: Negative for chills and fever.  Respiratory: Negative for cough and shortness of breath.   Cardiovascular: Negative for chest pain and palpitations.  Gastrointestinal: Positive for abdominal pain and diarrhea. Negative for nausea and vomiting.  Genitourinary: Negative for difficulty urinating, dyspareunia, dysuria, flank pain, frequency and hematuria.  Musculoskeletal: Negative for arthralgias and back pain.  Skin: Negative for color change and rash.  Neurological: Negative for dizziness, seizures, syncope, weakness and headaches.  Psychiatric/Behavioral: Positive for confusion and hallucinations.  All other systems reviewed and are negative.   Physical Exam Updated Vital Signs BP (!) 90/50 (BP Location: Right Arm)   Pulse 70   Temp 97.9 F (36.6 C) (Oral)   Resp 18   SpO2 93%   Physical Exam Constitutional:      Appearance: She is obese.  HENT:     Head: Normocephalic and atraumatic.     Mouth/Throat:     Mouth: Mucous membranes are moist.     Pharynx:  Oropharynx is clear.  Eyes:     Extraocular Movements: Extraocular movements intact.  Cardiovascular:     Rate and Rhythm: Normal rate and regular rhythm.  Pulmonary:     Effort: Pulmonary  effort is normal.     Breath sounds: Normal breath sounds.  Abdominal:     General: Bowel sounds are normal. There is distension.     Tenderness: There is abdominal tenderness. There is no right CVA tenderness, left CVA tenderness, guarding or rebound.     Comments: Diffuse abdominal tenderness, no focal pain  Musculoskeletal:        General: Swelling present.     Right lower leg: Edema present.     Left lower leg: Edema present.     Comments: 2+ pitting edema around the ankles and feet, 1+ on the shins.  No calf swelling bilaterally.  Skin:    Coloration: Skin is not jaundiced.     Comments: Dry, slightly purple and discolored skin on lower extremities bilaterally  Neurological:     General: No focal deficit present.     Mental Status: She is alert and oriented to person, place, and time.     Comments: Full range of motion of his ankle and legs.  Sensations intact.  Dorsalis pedis pulse present bilaterally.  Psychiatric:        Mood and Affect: Mood normal.        Behavior: Behavior normal.     ED Results / Procedures / Treatments   Labs (all labs ordered are listed, but only abnormal results are displayed) Labs Reviewed  COMPREHENSIVE METABOLIC PANEL - Abnormal; Notable for the following components:      Result Value   BUN 63 (*)    Creatinine, Ser 3.81 (*)    GFR calc non Af Amer 11 (*)    GFR calc Af Amer 13 (*)    All other components within normal limits  URINE CULTURE  CBC  BRAIN NATRIURETIC PEPTIDE  LIPASE, BLOOD  URINALYSIS, ROUTINE W REFLEX MICROSCOPIC  SODIUM, URINE, RANDOM  CREATININE, URINE, RANDOM    EKG None  Radiology DG Chest 2 View  Result Date: 12/04/2019 CLINICAL DATA:  69 year old female with cough. EXAM: CHEST - 2 VIEW COMPARISON:  Chest radiograph dated  11/03/2019. FINDINGS: There is no focal consolidation, pleural effusion, pneumothorax. The cardiac silhouette is within normal limits. No acute osseous pathology. IMPRESSION: No active cardiopulmonary disease. Electronically Signed   By: Anner Crete M.D.   On: 12/04/2019 17:27    Procedures Procedures (including critical care time)  Medications Ordered in ED Medications  sodium chloride 0.9 % bolus 500 mL (has no administration in time range)    ED Course  I have reviewed the triage vital signs and the nursing notes.  Pertinent labs & imaging results that were available during my care of the patient were reviewed by me and considered in my medical decision making (see chart for details).  Clinical Course as of Dec 04 1946  Thu Dec 04, 2019  1609 Dr. Laverta Baltimore spoke w/ family, it appears that she has had a significant altered mental status over the last few days which included taking off her clothes and sitting by the door. Given this information, will add on UA and focus workup on altered mental status additionally    [MB]  1804 BUN(!): 63 [MB]  1804 Creatinine(!): 3.81 [MB]  1804 GFR, Est African American(!): 13 [MB]    Clinical Course User Index [MB] Lyndel Safe   MDM Rules/Calculators/A&P                      69 year old female with an extensive medical history presents the ER from  her SNF with presumed acute kidney failure.  Records not available from SNF at this time. On presentation to the ER, patient is nontoxic-appearing, no acute distress, able to speak in full sentences, on 2 L nasal cannula which is her baseline at home presumably.  She has 2+ pitting edema in her ankles, 1+ in her shins with diffuse abdominal tenderness, unclear if abdomen is distended or this is her baseline..  Blood pressure soft at 90/50, will give 500 cc fluid bolus as the patient does not appear to be severely fluid overloaded.  Will order basic lab work, lipase, bladder scan to assess  urinary status.   CMP significant for severely elevated creatinine at 3.81, values 4 weeks ago were at 1.07.  BUN significant elevated 63, previous values 4 weeks ago was 6.  GFR 13.  CBC, lipase nonconcerning.  I personally reviewed the chest x-ray which was negative for acute cardiopulmonary pathology.  Patient bladder scanned in the ED, 530cc pre and post void.  At this point in the ED course, patient is exhibiting evidence of acute kidney injury, likely contributing to her altered mental status.  No focal neuro deficits suggesting stroke.  Dr. Laverta Baltimore consulted hospitalist and she will be admitted to their service for further evaluation/treatment.  Foley catheter ordered in the ER.  Will obtain urine analysis/culture after.  Patient was seen and evaluated by Dr. Rolla Plate who agrees with the above plan.   Final Clinical Impression(s) / ED Diagnoses Final diagnoses:  Acute kidney injury [facility])    Rx / DC Orders ED Discharge Orders    None       Lyndel Safe 12/04/19 1950    Long, Wonda Olds, MD 12/05/19 [phone]

## 2019-12-04 NOTE — H&P (Addendum)
Date: 12/04/2019               Patient Name:  Alisha [name] MRN: [id number]  DOB: 10-Nov-1950 Age / Sex: 69 y.o., female   PCP: Sonia Side., FNP         Medical Service: Internal Medicine Teaching Service         Attending Physician: Dr. Velna Ochs, MD    First Contact: Dr. Marva Panda Pager: [phone]  Second Contact: Dr. Myrtie Hawk Pager: [phone]       After Hours (After 5p/  First Contact Pager: [phone]  weekends / holidays): Second Contact Pager: [phone]   Chief Complaint: AMS  History of Present Illness: Alisha [name] is a 69 y.o female with Obesity, HTN, Asthma, Chronic venous insufficiency, Depression, and Polypharmacy who presented to the ED with altered mental status and AKI.  Alisha [name] reports episodes of confusion over the last few days. She reports being naked in her room waiting for someone to come get her. She reports another episode where she was waiting for a man to pick her up and he didn't come. She denies fevers, chills, chest pain, shortness of breath. She endorses a chronic sore throat. She also endorses new abdominal pain and states that she has "something that needs to be removed." She has been experiencing some nausea without emesis as well as a few episodes of diarrhea associated with certain foods. She denies dysuria, frequency and urgency and rather, reports a decrease in her urination recently. She reports that she usually urinates every 2 hrs but has not be urinating that often recently. She is continuing her increased dose of lasix for her chronic LE edema. She thinks she has had 2 falls at the nursing facility but denies LOC, dizziness or head trauma.   Meds:  No outpatient medications have been marked as taking for the 12/04/19 encounter [facility] Encounter).   Allergies: Allergies as of 12/04/2019 - Review Complete 12/04/2019  Allergen Reaction Noted  . Ibuprofen  08/15/2012  . Ampicillin Nausea Only 09/25/2016  . Asa [aspirin] Diarrhea  04/12/2004  . Darvon [propoxyphene] Nausea And Vomiting 04/12/2004  . Demeclocycline Other (See Comments) 09/25/2016  . Eggs or egg-derived products Diarrhea 02/19/2017  . Lactalbumin Diarrhea 02/19/2017  . Lisinopril Cough 05/04/2017  . Milk-related compounds Diarrhea 02/19/2017  . Salicylates Other (See Comments) 09/25/2016  . Tetracycline hcl Hives and Nausea Only 04/12/2004  . Tetracyclines & related Other (See Comments) 09/25/2016   Past Medical History:  Diagnosis Date  . Arthritis   . Asthma   . Bipolar disorder ([place])   . CAD (coronary artery disease) 07/07/2016   a. s/p stenting x 2 in [place] to the LAD, RCA is chronically occluded, LVEDP was 34  . Cancer (HCC)    cervical  . Depression   . Fibromyalgia   . GERD (gastroesophageal reflux disease)   . History of benign pituitary tumor   . History of kidney stones    1972  . Hyperlipidemia   . Hypertension   . Migraine   . Morbid obesity ([place])   . Peripheral vascular disease ([place])   . Pneumonia   . Seizures ([place])    during surgery for pituitary tumor  . Sleep apnea    with cpap  . Vertigo    Family History:  Family History  Problem Relation Age of Onset  . Heart disease Mother 74       CABG, pacemaker, valve  . Cancer Father   .  Heart disease Sister        Fluid around the heart  . Breast cancer Neg Hx    Social History:  Social History   Tobacco Use  . Smoking status: Never Smoker  . Smokeless tobacco: Never Used  Substance Use Topics  . Alcohol use: Yes    Comment: Socially. [name].  . Drug use: No   Review of Systems: A complete ROS was negative except as per HPI.   Physical Exam: Blood pressure (!) 90/50, pulse 70, temperature 97.9 F (36.6 C), temperature source Oral, resp. rate 18, SpO2 93 %.  Physical Exam  Constitutional: She is oriented to person, place, and time and well-developed, well-nourished, and in no distress.  HENT:  Head: Normocephalic and atraumatic.  Eyes: Pupils are  equal, round, and reactive to light. Conjunctivae and EOM are normal. Right eye exhibits no discharge. Left eye exhibits no discharge.  Cardiovascular: Normal rate, regular rhythm and normal heart sounds.  No murmur heard. Trace LE edema  Pulmonary/Chest: Effort normal and breath sounds normal. No respiratory distress. She has no wheezes. She has no rales.  Abdominal: Bowel sounds are normal. There is abdominal tenderness (suprapubic tenderness).  Palpable enlarged bladder  Musculoskeletal:     Cervical back: Normal range of motion.  Neurological: She is alert and oriented to person, place, and time. No cranial nerve deficit.  Slow mentation  Skin: Skin is warm and dry. No erythema.  Nursing note and vitals reviewed.  Labs: Results for orders placed or performed during the hospital encounter of 12/04/19 (from the past 24 hour(s))  CBC     Status: None   Collection Time: 12/04/19  4:46 PM  Result Value Ref Range   WBC 5.4 4.0 - 10.5 K/uL   RBC 4.25 3.87 - 5.11 MIL/uL   Hemoglobin 12.0 12.0 - 15.0 g/dL   HCT 37.8 36.0 - 46.0 %   MCV 88.9 80.0 - 100.0 fL   MCH 28.2 26.0 - 34.0 pg   MCHC 31.7 30.0 - 36.0 g/dL   RDW 14.1 11.5 - 15.5 %   Platelets 261 150 - 400 K/uL   nRBC 0.0 0.0 - 0.2 %  Comprehensive metabolic panel     Status: Abnormal   Collection Time: 12/04/19  4:46 PM  Result Value Ref Range   Sodium 140 135 - 145 mmol/L   Potassium 3.6 3.5 - 5.1 mmol/L   Chloride 100 98 - 111 mmol/L   CO2 25 22 - 32 mmol/L   Glucose, Bld 84 70 - 99 mg/dL   BUN 63 (H) 8 - 23 mg/dL   Creatinine, Ser 3.81 (H) 0.44 - 1.00 mg/dL   Calcium 9.6 8.9 - 10.3 mg/dL   Total Protein 7.7 6.5 - 8.1 g/dL   Albumin 3.5 3.5 - 5.0 g/dL   AST 36 15 - 41 U/L   ALT 35 0 - 44 U/L   Alkaline Phosphatase 95 38 - 126 U/L   Total Bilirubin 0.8 0.3 - 1.2 mg/dL   GFR calc non Af Amer 11 (L) >60 mL/min   GFR calc Af Amer 13 (L) >60 mL/min   Anion gap 15 5 - 15  Brain natriuretic peptide     Status: None    Collection Time: 12/04/19  4:46 PM  Result Value Ref Range   B Natriuretic Peptide 16.4 0.0 - 100.0 pg/mL  Lipase, blood     Status: None   Collection Time: 12/04/19  4:46 PM  Result Value Ref  Range   Lipase 29 11 - 51 U/L  Urinalysis, Routine w reflex microscopic     Status: Abnormal   Collection Time: 12/04/19  7:33 PM  Result Value Ref Range   Color, Urine YELLOW YELLOW   APPearance CLOUDY (A) CLEAR   Specific Gravity, Urine 1.012 1.005 - 1.030   pH 5.0 5.0 - 8.0   Glucose, UA NEGATIVE NEGATIVE mg/dL   Hgb urine dipstick NEGATIVE NEGATIVE   Bilirubin Urine NEGATIVE NEGATIVE   Ketones, ur NEGATIVE NEGATIVE mg/dL   Protein, ur NEGATIVE NEGATIVE mg/dL   Nitrite NEGATIVE NEGATIVE   Leukocytes,Ua NEGATIVE NEGATIVE  Sodium, urine, random     Status: None   Collection Time: 12/04/19  7:44 PM  Result Value Ref Range   Sodium, Ur 15 mmol/L  Creatinine, urine, random     Status: None   Collection Time: 12/04/19  7:44 PM  Result Value Ref Range   Creatinine, Urine 158.59 mg/dL   EKG: personally reviewed my interpretation is NSR without acute ischemic changes.  CXR: personally reviewed my interpretation is no acute cardiopulmonary disease.  Assessment:  Alisha [name] is a 69 yo F w/ a PMHx significant for CAD, HTN, HLD, asthma, OSA, obesity, GERD, chronic pain/fibromyalgia, venous stasis of the BLE, bipolar depression, hx of recurrent falls and polypharmacy who presents with AMS and AKI.  AMS: -Pt with hx of increased confusion and hallucinations at SNF whose labwork demonstrated an AKI with increased BUN for which she was sent to the Emergency Room for further work up -Creatinine 3.8 up from baseline of around 1 and BUN is 63 -pt with soft Bps on arrival -no dysuria reported but pt has been having some watery diarrhea -she reports thirst and decreased urination  -she also endorses lower abdominal pain -AMS could be due to elevated BUN vs. Polypharmacy as patient on numerous  centrally acting medicines including multiple anticholinergics   Plan: -IV fluids -AKI treatment below -hold anticholinergics  AKI: -Pt with hx of increased confusion and hallucinations at SNF and labwork demonstrated an AKI with increased BUN for which she was sent to the Emergency Room -Creatinine 3.8 up from baseline of around 1 in setting of hypotension -no dysuria reported but pt has been having some watery diarrhea; no fever or chills or other infectious sx  -she reports thirst and decreased urination; she reports that she usually urinates once every 2 hours and has been urinating less than that for the last few days -on exam she is somewhat dry appearing and has a palpable enlarged bladder on palpation of her abdomen -given fluids in the ED -bladder scan demonstrated >500 ccs of urine -this is likely post renal and secondary to polypharmacy including multiple centrally acting/anticholinergic medications she's on such as oxybutynin and amitriptyline   Plan: -place foley -strict I/Os -Renal US -hold BP meds/diuretics -hold anticholinergics -fu UA and cx -urine studies *addendum, urine studies consistent with prerenal azotemia (FENa 0.03%), thus, will order LR bolus followed by maintenance LR infusion  -daily RFP -renal diet -avoid nephrotoxic medications  CAD: HLD: -continue aspirin, clopidigrel, atorvastatin   Asthma: Upper airway cough syndrome: -continue home albuterol and spiriva  GERD: -continue home protonix 40 mg  Dispo: Admit patient to Inpatient with expected length of stay greater than 2 midnights.  Dispo: Admit patient to Inpatient with expected length of stay greater than 2 midnights.  Signed: Al Decant, MD 12/04/2019, 8:59 PM  Pager: [phone]

## 2019-12-04 NOTE — ED Triage Notes (Addendum)
PT present from [facility] by EMS. -reports that patient's lab show Acute kidney injury -EMS called to transport to the ED  Pt is alert on arrival  2lit [place] at baseline

## 2019-12-04 NOTE — ED Notes (Signed)
Bonney Roussel daughter [phone] call with an updates on the pt

## 2019-12-04 NOTE — ED Notes (Signed)
Transported to [place].

## 2019-12-05 ENCOUNTER — Inpatient Hospital Stay (HOSPITAL_COMMUNITY): Payer: Medicare Other

## 2019-12-05 ENCOUNTER — Other Ambulatory Visit (HOSPITAL_COMMUNITY): Payer: Medicare Other

## 2019-12-05 DIAGNOSIS — G4733 Obstructive sleep apnea (adult) (pediatric): Secondary | ICD-10-CM

## 2019-12-05 DIAGNOSIS — E785 Hyperlipidemia, unspecified: Secondary | ICD-10-CM

## 2019-12-05 DIAGNOSIS — F319 Bipolar disorder, unspecified: Secondary | ICD-10-CM

## 2019-12-05 DIAGNOSIS — I872 Venous insufficiency (chronic) (peripheral): Secondary | ICD-10-CM

## 2019-12-05 DIAGNOSIS — I639 Cerebral infarction, unspecified: Secondary | ICD-10-CM | POA: Insufficient documentation

## 2019-12-05 DIAGNOSIS — K219 Gastro-esophageal reflux disease without esophagitis: Secondary | ICD-10-CM

## 2019-12-05 DIAGNOSIS — J45909 Unspecified asthma, uncomplicated: Secondary | ICD-10-CM

## 2019-12-05 DIAGNOSIS — I2510 Atherosclerotic heart disease of native coronary artery without angina pectoris: Secondary | ICD-10-CM

## 2019-12-05 DIAGNOSIS — R471 Dysarthria and anarthria: Secondary | ICD-10-CM | POA: Diagnosis not present

## 2019-12-05 DIAGNOSIS — Z9181 History of falling: Secondary | ICD-10-CM

## 2019-12-05 DIAGNOSIS — Z79899 Other long term (current) drug therapy: Secondary | ICD-10-CM

## 2019-12-05 DIAGNOSIS — N179 Acute kidney failure, unspecified: Secondary | ICD-10-CM | POA: Diagnosis not present

## 2019-12-05 DIAGNOSIS — R4182 Altered mental status, unspecified: Secondary | ICD-10-CM | POA: Insufficient documentation

## 2019-12-05 DIAGNOSIS — G8929 Other chronic pain: Secondary | ICD-10-CM

## 2019-12-05 DIAGNOSIS — I959 Hypotension, unspecified: Secondary | ICD-10-CM | POA: Diagnosis not present

## 2019-12-05 DIAGNOSIS — M797 Fibromyalgia: Secondary | ICD-10-CM

## 2019-12-05 DIAGNOSIS — R1904 Left lower quadrant abdominal swelling, mass and lump: Secondary | ICD-10-CM

## 2019-12-05 DIAGNOSIS — I10 Essential (primary) hypertension: Secondary | ICD-10-CM

## 2019-12-05 DIAGNOSIS — W19XXXA Unspecified fall, initial encounter: Secondary | ICD-10-CM | POA: Insufficient documentation

## 2019-12-05 LAB — LIPID PANEL
Cholesterol: 127 mg/dL (ref 0–200)
HDL: 40 mg/dL — ABNORMAL LOW (ref >40)
LDL Cholesterol: 59 mg/dL (ref 0–99)
Total CHOL/HDL Ratio: 3.2 RATIO
Triglycerides: 140 mg/dL (ref <150)
VLDL: 28 mg/dL (ref 0–40)

## 2019-12-05 LAB — RENAL FUNCTION PANEL
Albumin: 3 g/dL — ABNORMAL LOW (ref 3.5–5.0)
Anion gap: 14 (ref 5–15)
BUN: 58 mg/dL — ABNORMAL HIGH (ref 8–23)
CO2: 25 mmol/L (ref 22–32)
Calcium: 9.2 mg/dL (ref 8.9–10.3)
Chloride: 102 mmol/L (ref 98–111)
Creatinine, Ser: 3 mg/dL — ABNORMAL HIGH (ref 0.44–1.00)
GFR calc Af Amer: 18 mL/min — ABNORMAL LOW (ref >60)
GFR calc non Af Amer: 15 mL/min — ABNORMAL LOW (ref >60)
Glucose, Bld: 72 mg/dL (ref 70–99)
Phosphorus: 3 mg/dL (ref 2.5–4.6)
Potassium: 3.5 mmol/L (ref 3.5–5.1)
Sodium: 141 mmol/L (ref 135–145)

## 2019-12-05 LAB — BASIC METABOLIC PANEL
Anion gap: 14 (ref 5–15)
BUN: 44 mg/dL — ABNORMAL HIGH (ref 8–23)
CO2: 22 mmol/L (ref 22–32)
Calcium: 9.4 mg/dL (ref 8.9–10.3)
Chloride: 107 mmol/L (ref 98–111)
Creatinine, Ser: 2.02 mg/dL — ABNORMAL HIGH (ref 0.44–1.00)
GFR calc Af Amer: 28 mL/min — ABNORMAL LOW (ref >60)
GFR calc non Af Amer: 24 mL/min — ABNORMAL LOW (ref >60)
Glucose, Bld: 64 mg/dL — ABNORMAL LOW (ref 70–99)
Potassium: 3.7 mmol/L (ref 3.5–5.1)
Sodium: 143 mmol/L (ref 135–145)

## 2019-12-05 LAB — CBC
HCT: 37.1 % (ref 36.0–46.0)
Hemoglobin: 11.7 g/dL — ABNORMAL LOW (ref 12.0–15.0)
MCH: 28 pg (ref 26.0–34.0)
MCHC: 31.5 g/dL (ref 30.0–36.0)
MCV: 88.8 fL (ref 80.0–100.0)
Platelets: 222 10*3/uL (ref 150–400)
RBC: 4.18 MIL/uL (ref 3.87–5.11)
RDW: 13.9 % (ref 11.5–15.5)
WBC: 4.1 10*3/uL (ref 4.0–10.5)
nRBC: 0 % (ref 0.0–0.2)

## 2019-12-05 LAB — SARS CORONAVIRUS 2 (TAT 6-24 HRS): SARS Coronavirus 2: NEGATIVE

## 2019-12-05 MED ORDER — CHLORHEXIDINE GLUCONATE CLOTH 2 % EX PADS
6.0000 | MEDICATED_PAD | Freq: Every day | CUTANEOUS | Status: DC
  Administered 2019-12-05 – 2019-12-09 (×5): 6 via TOPICAL

## 2019-12-05 MED ORDER — STROKE: EARLY STAGES OF RECOVERY BOOK
Freq: Once | Status: DC
  Filled 2019-12-05: qty 1

## 2019-12-05 MED ORDER — SODIUM CHLORIDE 0.9% FLUSH: 10.0000 mL | INTRAVENOUS | Status: DC | PRN

## 2019-12-05 MED ORDER — SODIUM CHLORIDE 0.9% FLUSH
10.0000 mL | Freq: Two times a day (BID) | INTRAVENOUS | Status: DC
  Administered 2019-12-06 (×2): 10 mL

## 2019-12-05 MED ORDER — LACTATED RINGERS IV SOLN: INTRAVENOUS | Status: DC

## 2019-12-05 NOTE — ED Notes (Signed)
Pt's daughters updated on status and plan of care

## 2019-12-05 NOTE — Progress Notes (Signed)
Portable EEG completed, results pending. 

## 2019-12-05 NOTE — ED Notes (Signed)
Tele

## 2019-12-05 NOTE — ED Notes (Signed)
EEG at bedside.

## 2019-12-05 NOTE — Progress Notes (Signed)
Subjective: HD#1 Overnight, patient admitted for altered mental status in setting of AKI.  This morning, patient evaluated at bedside. She is sitting at edge of bed. She is a poor historian; however, does note recent fall without significant trauma approximately one week ago.   Objective:  Vital signs in last 24 hours: Vitals:   12/05/19 0525 12/05/19 0526 12/05/19 0530 12/05/19 0545  BP:   (!) 155/123 (!) 134/113  Pulse: 64 65 87 63  Resp:      Temp:      TempSrc:      SpO2: 100% 99%  97%   CBC Latest Ref Rng & Units 12/05/2019 12/04/2019 11/05/2019  WBC 4.0 - 10.5 K/uL 4.1 5.4 5.0  Hemoglobin 12.0 - 15.0 g/dL 11.7(L) 12.0 12.4  Hematocrit 36.0 - 46.0 % 37.1 37.8 37.4  Platelets 150 - 400 K/uL 222 261 327   CMP Latest Ref Rng & Units 12/05/2019 12/04/2019 11/05/2019  Glucose 70 - 99 mg/dL 72 84 121(H)  BUN 8 - 23 mg/dL 58(H) 63(H) 6(L)  Creatinine 0.44 - 1.00 mg/dL 3.00(H) 3.81(H) 1.07(H)  Sodium 135 - 145 mmol/L 141 140 142  Potassium 3.5 - 5.1 mmol/L 3.5 3.6 4.9  Chloride 98 - 111 mmol/L 102 100 110  CO2 22 - 32 mmol/L 25 25 24   Calcium 8.9 - 10.3 mg/dL 9.2 9.6 8.9  Total Protein 6.5 - 8.1 g/dL - 7.7 -  Total Bilirubin 0.3 - 1.2 mg/dL - 0.8 -  Alkaline Phos 38 - 126 U/L - 95 -  AST 15 - 41 U/L - 36 -  ALT 0 - 44 U/L - 35 -    Physical Exam  Constitutional: She is oriented to person, place, and time and well-developed, well-nourished, and in no distress. Vital signs are normal. No distress.  HENT:  Head: Normocephalic and atraumatic.  Eyes: Conjunctivae and EOM are normal.  Cardiovascular: Normal rate, regular rhythm, normal heart sounds and intact distal pulses.  Pulmonary/Chest: Effort normal and breath sounds normal. No respiratory distress. She has no wheezes. She has no rales.  Abdominal: Soft. Bowel sounds are normal. She exhibits distension and mass. There is no abdominal tenderness. There is no rebound.  Palpable LLQ mass   Musculoskeletal:        General: No  tenderness or edema. Normal range of motion.     Comments: Right leg appears to be larger in diameter than left (chronic)  Neurological: She is alert and oriented to person, place, and time.  Slurred speech/word finding difficulty noted on exam Able to identify objects  Finger-to-nose test wnl  Strength 5/5 in BUE; 5/5 in LLE, 4/5 in RLE  Skin: Skin is warm and dry. She is not diaphoretic.    Assessment/Plan: Ms. Burmeister is a 69 yr old female with PMHx of CAD, HTN, HLD, asthma, OSA, obesity, GERD, chronic pain from fibromyalgia, lower extremity venous stasis, bipolar depression with history of recurrent falls presenting with AMS and AKI.   Altered mental status: Patient presented with increased confusion from SNF with lab work concerning for AKI with increased BUN.  During examination, she is alert and oriented, however is a poor historian.  She does have some word finding difficulty and dysarthria.  No obvious cranial nerve deficits noted however, she did have significant weakness of the right lower extremity compared to the left.  Suspect is secondary to polypharmacy as patient is on numerous centrally acting medications versus elevated BUN on presentation from dehydration.  No CT head  this admission, however given concern for possible stroke in setting of right lower extremity weakness and dysarthria, will obtain MRI brain.. -MRI brain without contrast -Holding centrally acting medications -Continue to monitor  AKI:  Patient with altered mental status from SNF and lab work with AKI on outpatient work-up. sCr 3.81 on presentation (baseline ~1). Bladder scan did have >500cc's urine for which foley placed. Renal US negative for hydronephrosis and  FeNa consistent with prerenal injury.sCr Improved to 3.00 with IVF.  - Continue IVF  - Strict I/O  - Holding diuretics - BMP daily - Avoid nephrotoxic agents   Prior to Admission Living Arrangement: SNF Anticipated Discharge Location: SNF   Barriers to Discharge: Ongoing medical evaluation and treatment  Dispo: Anticipated discharge in approximately 1-2 day(s).   Harvie Heck, MD  Internal Medicine, PGY-1 12/05/2019, 6:55 AM Pager: [phone]

## 2019-12-05 NOTE — ED Notes (Signed)
Pt transported to MRI 

## 2019-12-05 NOTE — ED Notes (Signed)
Breakfast Ordered 

## 2019-12-05 NOTE — Procedures (Signed)
Patient Name: Alisha [name]  MRN: [id number]  Epilepsy Attending: Lora Havens  Referring Physician/Provider: Dr Harvie Heck Date: 12/05/2019 Duration: 23.55 mins  Patient history: 69 year old female with altered mental status.  EEG evaluate for seizures.  Level of alertness: Awake  AEDs during EEG study: Gabapentin  Technical aspects: This EEG study was done with scalp electrodes positioned according to the 10-20 International system of electrode placement. Electrical activity was acquired at a sampling rate of 500Hz  and reviewed with a high frequency filter of 70Hz  and a low frequency filter of 1Hz . EEG data were recorded continuously and digitally stored.   Description: During awake state, no clear posterior dominant was seen.  EEG showed continuous generalized 5 to 7 Hz theta slowing.  Hyperventilation and photic stimulation were not performed.  Abnormality -Continuous slow, generalized  IMPRESSION: This study is suggestive of mild to moderate diffuse encephalopathy, nonspecific to etiology. No seizures or epileptiform discharges were seen throughout the recording.  Alisha [name] [name]

## 2019-12-05 NOTE — Progress Notes (Signed)
Pt refused to be stuck for PIV.  Primary RN was made ware of pt's refusal.

## 2019-12-05 NOTE — Progress Notes (Signed)
Patient arrived to 6N6 via stretcher from ED, denies pain, aox4, VSS, with ongoing IVF LR at 100, oriented to to room, use of call bell and bed controls, informed about visitation policy.  Will continue to monitor.

## 2019-12-05 NOTE — Consult Note (Addendum)
Requesting Physician: Dr. Philipp Ovens    Chief Complaint: Altered mental status  History obtained from: Patient and Chart    HPI:                                                                                                                                       Dung Dewhirst is a 69 y.o. 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  Patient gives a vague recollection of the events that occurred over the last month or so including her right leg weakness, falling.  States that she remembers walking naked and feeling very confused, was sent to rehab and was told her kidneys were not working and was sent here.  She denies any hallucinations.  Work-up in the ED revealed AKI with increased BUN.  MRI brain was performed for altered mental status work-up revealed a small left frontal white matter infarction.  Neurology was consulted for further recommendations.  Date last known well: 2-3 days ago tPA Given: Outside window for TPA  Past Medical History:  Diagnosis Date  . Arthritis   . Asthma   . Bipolar disorder ([place])   . CAD (coronary artery disease) 07/07/2016   a. s/p stenting x 2 in [place] to the LAD, RCA is chronically occluded, LVEDP was 34  . Cancer ([facility])    cervical  . Depression   . Fibromyalgia   . GERD (gastroesophageal reflux disease)   . History of benign pituitary tumor   . History of kidney stones    1972  . Hyperlipidemia   . Hypertension   . Migraine   . Morbid obesity ([place])   . Peripheral vascular disease ([place])   . Pneumonia   . Seizures ([place])    during surgery for pituitary tumor  . Sleep apnea    with cpap  . Vertigo      Past Surgical History:  Procedure Laterality Date  . ANKLE FUSION Bilateral   . carpel tunnel release Bilateral 2017  . CORONARY ANGIOPLASTY    . ESOPHAGOGASTRODUODENOSCOPY (EGD) WITH PROPOFOL N/A 06/14/2017   Procedure: ESOPHAGOGASTRODUODENOSCOPY (EGD) WITH PROPOFOL;  Surgeon: Wilford Corner, MD;  Location: [place] [facility];  Service: Endoscopy;  Laterality: N/A;  . EXCISION / CURETTAGE BONE CYST PHALANGES OF FOOT  2014   Removal of foot cyst   . PARTIAL HYSTERECTOMY  unknown   Patient still has ovaries  . pituitary tumor removal  1999   removed as much as they could, on optic nerve  . TONSILLECTOMY    . TOTAL KNEE ARTHROPLASTY     TKR X 3  2 on the left and 1 on the right    Family History  Problem Relation  Age of Onset  . Heart disease Mother 60       CABG, pacemaker, valve  . Cancer Father   . Heart disease Sister        Fluid around the heart  . Breast cancer Neg Hx    Social History:  reports that she has never smoked. She has never used smokeless tobacco. She reports current alcohol use. She reports that she does not use drugs.  Allergies:  Allergies  Allergen Reactions  . Ibuprofen     Other reaction(s): Upset Stomach  . Ampicillin Nausea Only  . Asa [Aspirin] Diarrhea    Other reaction(s): Upset Stomach  . Darvon [Propoxyphene] Nausea And Vomiting  . Demeclocycline Other (See Comments)    Nerves feeling  . Eggs Or Egg-Derived Products Diarrhea  . Lactalbumin Diarrhea  . Lisinopril Cough  . Milk-Related Compounds Diarrhea  . Salicylates Other (See Comments)    Unknown Unknown   . Tetracycline Hcl Hives and Nausea Only  . Tetracyclines & Related Other (See Comments)    Nerves feeling     Medications:                                                                                                                        I reviewed home medications   ROS:                                                                                                                                      Limited due to altered mental status   Examination:                                                                                                      General: Appears well-developed  Psych: Affect appropriate to situation Eyes: No scleral injection HENT: No OP obstrucion Head: Normocephalic.  Cardiovascular: Normal rate and regular rhythm.  Respiratory: Effort normal and breath sounds normal to anterior ascultation GI: Soft.  No distension. There is  no tenderness.  Ext: Leg significantly edematous compared to the left    Neurological Examination Mental Status: Alert, oriented to her age and month didn't realize she was in the hospital, thought content mostly appropriate with some tangential speech.  Speech fluent without evidence of aphasia. Able to follow 3 step commands without difficulty. Cranial Nerves: II: Visual fields grossly normal,  III,IV, VI: ptosis not present, extra-ocular motions intact bilaterally, pupils equal, round, reactive to light and accommodation V,VII: smile symmetric, facial light touch sensation normal bilaterally VIII: hearing normal bilaterally IX,X: uvula rises symmetrically XI: bilateral shoulder shrug XII: midline tongue extension Motor: Right : Upper extremity   5/5    Left:     Upper extremity   5/5  Lower extremity   4/5     Lower extremity   4/5 Tone and bulk:normal tone throughout; no atrophy noted Sensory: Pinprick and light touch intact throughout, bilaterally Deep Tendon Reflexes: 2+ and symmetric throughout Plantars: Right: downgoing   Left: downgoing Cerebellar: normal finger-to-nose, normal rapid alternating movements and normal heel-to-shin test Gait: normal gait and station     Lab Results: Basic Metabolic Panel: Recent Labs  Lab 12/04/19 1646 12/05/19 0258  NA 140 141  K 3.6 3.5  CL 100 102  CO2 25 25  GLUCOSE 84 72  BUN 63* 58*  CREATININE 3.81* 3.00*  CALCIUM 9.6 9.2  PHOS  --   3.0    CBC: Recent Labs  Lab 12/04/19 1646 12/05/19 0258  WBC 5.4 4.1  HGB 12.0 11.7*  HCT 37.8 37.1  MCV 88.9 88.8  PLT 261 222    Coagulation Studies: No results for input(s): LABPROT, INR in the last 72 hours.  Imaging: EEG  Result Date: 12/05/2019 Lora Havens, MD     12/05/2019  4:04 PM Patient Name: Shataya Virrueta MRN: [id number] Epilepsy Attending: Lora Havens Referring Physician/Provider: Dr Harvie Heck Date: 12/05/2019 Duration: 23.55 mins Patient history: 69 year old female with altered mental status.  EEG evaluate for seizures. Level of alertness: Awake AEDs during EEG study: Gabapentin Technical aspects: This EEG study was done with scalp electrodes positioned according to the 10-20 International system of electrode placement. Electrical activity was acquired at a sampling rate of 500Hz  and reviewed with a high frequency filter of 70Hz  and a low frequency filter of 1Hz . EEG data were recorded continuously and digitally stored. Description: During awake state, no clear posterior dominant was seen.  EEG showed continuous generalized 5 to 7 Hz theta slowing.  Hyperventilation and photic stimulation were not performed. Abnormality -Continuous slow, generalized IMPRESSION: This study is suggestive of mild to moderate diffuse encephalopathy, nonspecific to etiology. No seizures or epileptiform discharges were seen throughout the recording. Lora Havens   DG Chest 2 View  Result Date: 12/04/2019 CLINICAL DATA:  69 year old female with cough. EXAM: CHEST - 2 VIEW COMPARISON:  Chest radiograph dated 11/03/2019. FINDINGS: There is no focal consolidation, pleural effusion, pneumothorax. The cardiac silhouette is within normal limits. No acute osseous pathology. IMPRESSION: No active cardiopulmonary disease. Electronically Signed   By: Anner Crete M.D.   On: 12/04/2019 17:27   MR ANGIO HEAD WO CONTRAST  Result Date: 12/05/2019 CLINICAL DATA:  69 year old female found to  have a small left anterior frontal lobe white matter infarct on MRI earlier today. 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 EXAM: MRA HEAD WITHOUT CONTRAST TECHNIQUE: Angiographic images of the Circle of Willis were obtained using MRA technique without intravenous contrast. COMPARISON:  Brain MRI earlier today, 10/15/2018. FINDINGS: Patent but diminutive posterior circulation with antegrade flow in codominant distal vertebral arteries. No distal vertebral stenosis. Patent vertebrobasilar junction with mild irregularity but no significant basilar stenosis. Patent SCA and fetal type bilateral PCA origins. There are bilateral P1 segments present. Distal PCA branches are within normal limits; suspect some artifactual signal loss in the right P3 superior division. Antegrade flow in both ICA siphons. The right siphon is mildly irregular but without significant stenosis. Normal right ophthalmic and posterior communicating artery origins. Patent right ICA terminus. The left siphon demonstrates smooth circumferential narrowing in the distal petrous and proximal cavernous segment which might be tumor related. Overall the degree of stenosis is mild-to-moderate. The distal left siphon then has a normal appearance. Normal left posterior communicating artery origin. Patent left ICA terminus. Tortuous ACA A1 segments. Mild stenosis at the left A1 origin. Visible ACA branches are within normal limits. Normal left MCA M1 and bifurcation. The right M1 bifurcates early and there is suggestion of a 2 mm aneurysm directed superiorly and laterally at the bifurcation (series 253, image 10). There is also regional right frontotemporal parenchymal encephalomalacia in this region. No proximal MCA branch occlusion is identified. IMPRESSION: 1. Negative for large vessel occlusion. 2. Circumferential mild to moderate narrowing of the Left ICA siphon which may be related to the chronic cavernous sinus  tumor. 3. 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 Given the small size surveillance MRA (annual or biennial) may be the best management strategy. Alternatively Neurosurgery and/or Neuro-Interventional Radiology consultation could be pursued to evaluate the appropriateness of potential treatment. 4. Posterior circulation is diminutive on the basis of fetal type PCA origins, with no definite hemodynamically significant stenosis. Electronically Signed   By: Genevie Ann M.D.   On: 12/05/2019 18:15   MR BRAIN WO CONTRAST  Result Date: 12/05/2019 CLINICAL DATA:  Subacute altered mental status. Previous surgery for pituitary tumor. EXAM: MRI HEAD WITHOUT CONTRAST TECHNIQUE: Multiplanar, multiecho pulse sequences of the brain and surrounding structures were obtained without intravenous contrast. COMPARISON:  Head CT 11/03/2019.  MRI 10/15/2018. FINDINGS: Brain: Diffusion imaging shows a subcentimeter acute infarction in the left frontal white matter adjacent to the frontal horn of the lateral ventricle. Old small vessel ischemic changes affect the pons and cerebellum. Chronic atrophy, encephalomalacia and gliosis at the base of the brain on the right affecting the frontal and temporal lobe. Old small vessel infarctions of the thalami and basal ganglia. No mass lesion, hemorrhage, hydrocephalus or extra-axial collection. Small amount of residual pituitary tumor previously shown in the left cavernous sinus is difficult to demonstrate on this standard unenhanced brain exam. Vascular: Major vessels at the base of the brain show flow. Skull and upper cervical spine: Pterional craniotomy on the right. Sinuses/Orbits: Retention cyst in the right maxillary sinus. Sinuses otherwise clear. Orbits negative. Other: None IMPRESSION: Acute subcentimeter infarction affecting the left frontal white matter adjacent to the frontal horn of the lateral ventricle. Previous craniotomy for pituitary tumor resection.  Atrophy, encephalomalacia and gliosis at the base of the brain on the right presumably subsequent to that. Small amount of residual pituitary tumor in the left cavernous sinus, better shown on previous detailed contrast enhanced exams. Extensive chronic small-vessel ischemic changes elsewhere affecting the brainstem, cerebellum, thalami, basal ganglia and hemispheric white matter. Electronically Signed   By: Nelson Chimes M.D.   On: 12/05/2019 13:38   US RENAL  Result Date: 12/04/2019 CLINICAL DATA:  69 year old  female with acute renal insufficiency. EXAM: RENAL / URINARY TRACT ULTRASOUND COMPLETE COMPARISON:  Ultrasound dated 05/02/2018. FINDINGS: Evaluation is limited due to body habitus. Right Kidney: Renal measurements: 14.5 x 6.6 x 6.4 cm = volume: 317 mL. Normal echogenicity. No hydronephrosis or shadowing stone. There is a 6.8 x 6.4 x 6.7 cm inferior pole cyst. Left Kidney: Renal measurements: 11.8 x 6.3 x 5.4 cm = volume: 209 mL. Normal echogenicity. No hydronephrosis or shadowing stone. Bladder: The urinary bladder is decompressed around a Foley catheter. Other: Fatty infiltration of the visualized liver. IMPRESSION: 1. No hydronephrosis or shadowing stone. 2. Right renal inferior pole cyst. 3. Fatty liver. Electronically Signed   By: Anner Crete M.D.   On: 12/04/2019 22:43     ASSESSMENT AND PLAN   69 year old female with past medical history significant for bipolar disorder with recurrent falls likely due to lower extremity venous stasis, significant stroke risk factors presents with altered mental status and AKI.  Patient slightly confused, but engages conversation and gives a vague recollection of the events of the last 1 month.  MRI brain shows small right frontal white matter infarct-this is not causing her altered mental status.  Recommended EEG which showed no epileptiform discharges, suggestive of mild encephalopathy.  Likely has metabolic encephalopathy in setting of AKI.   Hallucinations may also be from bipolar disorder, continues to persist recommend psychiatric consult.   Acute encephalopathy in the setting of AKI/uremia Other considerations include polypharmacy, psychiatric  Recommendations Routine EEG: No epileptiform discharges, suggestive of mild/moderate encephalopathy Correct metabolic and electrolyte abnormalities Consider psych consult if hallucinations persist  Minimize sedating medications   Left frontal white matter infarction-incidental Likely from vascular risk factors such as hypertension, hyperlipidemia Etiology: Small vessel disease  #MRA Head and neck  #Transthoracic Echo  # Start patient on ASA 81mg  daily #Start or continue Atorvastatin 40 mg/other high intensity statin # BP goal: less than 140/90 mmhg, avoid hypotension # HBAIC and Lipid profile # Telemetry monitoring # Frequent neuro checks # Stroke swallow screen   Jaidynn Balster [facility] Pager Number [phone]

## 2019-12-06 ENCOUNTER — Inpatient Hospital Stay (HOSPITAL_COMMUNITY): Payer: Medicare Other

## 2019-12-06 DIAGNOSIS — I361 Nonrheumatic tricuspid (valve) insufficiency: Secondary | ICD-10-CM

## 2019-12-06 DIAGNOSIS — I872 Venous insufficiency (chronic) (peripheral): Secondary | ICD-10-CM | POA: Diagnosis not present

## 2019-12-06 DIAGNOSIS — R471 Dysarthria and anarthria: Secondary | ICD-10-CM | POA: Diagnosis not present

## 2019-12-06 DIAGNOSIS — R4182 Altered mental status, unspecified: Secondary | ICD-10-CM

## 2019-12-06 DIAGNOSIS — N179 Acute kidney failure, unspecified: Secondary | ICD-10-CM | POA: Diagnosis not present

## 2019-12-06 LAB — CBC
HCT: 36 % (ref 36.0–46.0)
Hemoglobin: 11.6 g/dL — ABNORMAL LOW (ref 12.0–15.0)
MCH: 28.5 pg (ref 26.0–34.0)
MCHC: 32.2 g/dL (ref 30.0–36.0)
MCV: 88.5 fL (ref 80.0–100.0)
Platelets: 244 10*3/uL (ref 150–400)
RBC: 4.07 MIL/uL (ref 3.87–5.11)
RDW: 13.6 % (ref 11.5–15.5)
WBC: 5.3 10*3/uL (ref 4.0–10.5)
nRBC: 0 % (ref 0.0–0.2)

## 2019-12-06 LAB — RENAL FUNCTION PANEL
Albumin: 2.9 g/dL — ABNORMAL LOW (ref 3.5–5.0)
Anion gap: 12 (ref 5–15)
BUN: 37 mg/dL — ABNORMAL HIGH (ref 8–23)
CO2: 26 mmol/L (ref 22–32)
Calcium: 9.4 mg/dL (ref 8.9–10.3)
Chloride: 107 mmol/L (ref 98–111)
Creatinine, Ser: 1.66 mg/dL — ABNORMAL HIGH (ref 0.44–1.00)
GFR calc Af Amer: 36 mL/min — ABNORMAL LOW (ref >60)
GFR calc non Af Amer: 31 mL/min — ABNORMAL LOW (ref >60)
Glucose, Bld: 75 mg/dL (ref 70–99)
Phosphorus: 2.8 mg/dL (ref 2.5–4.6)
Potassium: 3.7 mmol/L (ref 3.5–5.1)
Sodium: 145 mmol/L (ref 135–145)

## 2019-12-06 LAB — HEMOGLOBIN A1C
Hgb A1c MFr Bld: 6.9 % — ABNORMAL HIGH (ref 4.8–5.6)
Mean Plasma Glucose: 151 mg/dL

## 2019-12-06 LAB — ECHOCARDIOGRAM COMPLETE: Weight: 4303.38 oz

## 2019-12-06 LAB — URINE CULTURE

## 2019-12-06 MED ORDER — ACETAMINOPHEN 325 MG PO TABS
650.0000 mg | ORAL_TABLET | Freq: Four times a day (QID) | ORAL | Status: DC | PRN
  Administered 2019-12-06 – 2019-12-10 (×7): 650 mg via ORAL
  Filled 2019-12-06 (×6): qty 2

## 2019-12-06 MED ORDER — GABAPENTIN 300 MG PO CAPS
300.0000 mg | ORAL_CAPSULE | Freq: Three times a day (TID) | ORAL | Status: DC
  Administered 2019-12-06 – 2019-12-11 (×14): 300 mg via ORAL
  Filled 2019-12-06 (×14): qty 1

## 2019-12-06 MED ORDER — GABAPENTIN 100 MG PO CAPS
200.0000 mg | ORAL_CAPSULE | Freq: Three times a day (TID) | ORAL | Status: DC
  Administered 2019-12-06: 200 mg via ORAL
  Filled 2019-12-06: qty 2

## 2019-12-06 NOTE — Evaluation (Signed)
Physical Therapy Evaluation Patient Details Name: Alisha [name] MRN: [id number] DOB: 09/26/1950 Today's Date: 12/06/2019   History of Present Illness  Alisha [name] is a 69 y.o. 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 Work up in ED revealed AKI. MRI with small left frontal white matter infarction.  Clinical Impression  Prior to admission, pt lives alone in an apartment, uses a walker for ambulation and is independent with ADL's. Reports 4 recent falls. On PT evaluation, pt unable to stand from edge of bed with one person maximal assist. Is able to achieve hip clearance, but unable to power to upright. Able to don socks with some difficulty and supervision. Pt presents with gross weakness, balance impairments, decreased activity tolerance and decreased cognition. Presents as high fall risk based on history of falls and decreased safety awareness. In light of deficits and decreased caregiver support, recommending SNF.     Follow Up Recommendations SNF;Supervision/Assistance - 24 hour    Equipment Recommendations  None recommended by PT    Recommendations for Other Services       Precautions / Restrictions Precautions Precautions: Fall Restrictions Weight Bearing Restrictions: No      Mobility  Bed Mobility Overal bed mobility: Needs Assistance Bed Mobility: Supine to Sit;Sit to Supine     Supine to sit: Min guard Sit to supine: Mod assist   General bed mobility comments: Increased time to sit up on edge of bed. ModA for BLE negotiation back into bed. +2 to boost up in bed and reposition,.  Transfers Overall transfer level: Needs  assistance Equipment used: Rolling walker (2 wheeled) Transfers: Sit to/from Stand Sit to Stand: Max assist         General transfer comment: Pt unable to stand upright from bed with maxA after several attempts. Able to achieve hip clearance.  Ambulation/Gait                Stairs            Wheelchair Mobility    Modified Rankin (Stroke Patients Only)       Balance Overall balance assessment: Needs assistance Sitting-balance support: Feet supported Sitting balance-Leahy Scale: Fair                                       Pertinent Vitals/Pain Pain Assessment: No/denies pain    Home Living Family/patient expects to be discharged to:: Private residence Living Arrangements: Alone Available Help at Discharge: Family Type of Home: Apartment Home Access: Elevator     Home Layout: One level Home Equipment: Environmental consultant - 2 wheels;Wheelchair - manual;Shower seat - built in;Grab bars - toilet;Grab bars - tub/shower Additional Comments: sleeps in a recliner    Prior Function Level of Independence: Independent with assistive device(s)         Comments: Uses walker, takes [employer] for transportation. Reports 4 falls in past 3 months      Hand Dominance   Dominant Hand: Right    Extremity/Trunk Assessment   Upper Extremity Assessment Upper Extremity Assessment: Defer to OT evaluation    Lower Extremity Assessment Lower Extremity Assessment: Generalized weakness    Cervical / Trunk Assessment Cervical / Trunk Assessment: Other  exceptions Cervical / Trunk Exceptions: increased body habitus  Communication   Communication: No difficulties  Cognition Arousal/Alertness: Awake/alert Behavior During Therapy: WFL for tasks assessed/performed Overall Cognitive Status: Impaired/Different from baseline Area of Impairment: Memory;Safety/judgement;Awareness;Problem solving                     Memory: Decreased short-term memory    Safety/Judgement: Decreased awareness of safety;Decreased awareness of deficits Awareness: Intellectual Problem Solving: Difficulty sequencing;Requires verbal cues;Requires tactile cues General Comments: Pt A&Ox4, but demonstrates decreased awareness of safety/deficits, requires cues for problem solving. Perseverating at times, stating, "Where's my phone?"       General Comments      Exercises     Assessment/Plan    PT Assessment Patient needs continued PT services  PT Problem List Decreased strength;Decreased activity tolerance;Decreased balance;Decreased mobility;Decreased cognition;Decreased safety awareness       PT Treatment Interventions DME instruction;Gait training;Functional mobility training;Therapeutic activities;Therapeutic exercise;Balance training;Patient/family education;Wheelchair mobility training    PT Goals (Current goals can be found in the Care Plan section)  Acute Rehab PT Goals Patient Stated Goal: to rest PT Goal Formulation: With patient Time For Goal Achievement: 12/20/19 Potential to Achieve Goals: Fair    Frequency Min 3X/week   Barriers to discharge        Co-evaluation               AM-PAC PT "6 Clicks" Mobility  Outcome Measure Help needed turning from your back to your side while in a flat bed without using bedrails?: A Little Help needed moving from lying on your back to sitting on the side of a flat bed without using bedrails?: A Little Help needed moving to and from a bed to a chair (including a wheelchair)?: Total Help needed standing up from a chair using your arms (e.g., wheelchair or bedside chair)?: Total Help needed to walk in hospital room?: Total Help needed climbing 3-5 steps with a railing? : Total 6 Click Score: 10    End of Session Equipment Utilized During Treatment: Gait belt Activity Tolerance: Patient limited by fatigue Patient left: in bed;with call bell/phone within reach;with bed alarm set Nurse  Communication: Mobility status PT Visit Diagnosis: Other abnormalities of gait and mobility (R26.89);Muscle weakness (generalized) (M62.81)    Time: [id number] PT Time Calculation (min) (ACUTE ONLY): 29 min   Charges:   PT Evaluation $PT Eval Moderate Complexity: 1 Mod PT Treatments $Therapeutic Activity: 8-22 mins          Wyona Almas, PT, DPT Acute Rehabilitation Services Pager [phone] Office [phone]   Deno Etienne 12/06/2019, 3:13 PM

## 2019-12-06 NOTE — CV Procedure (Signed)
2D echo attempted but patient care in room. Will try echo later

## 2019-12-06 NOTE — Progress Notes (Addendum)
Subjective: HD#2 No acute events reported overnight.   This morning, patient evaluated at bedside.She is lying comfortably in bed and in no acute distress. Patient notes that her confusion has resolved.  She does note ongoing right lower extremity weakness and sharp shooting pain down lateral aspect of her right leg.  Objective:  Vital signs in last 24 hours: Vitals:   12/05/19 2127 12/06/19 0219 12/06/19 0505 12/06/19 1405  BP: (!) 154/73  (!) 169/75 (!) 140/93  Pulse: 71  68 70  Resp: 16  16 20   Temp: 97.8 F (36.6 C)  98.2 F (36.8 C) 97.6 F (36.4 C)  TempSrc: Oral  Oral Oral  SpO2: 100% 100% 100% 98%  Weight: 122 kg      CBC Latest Ref Rng & Units 12/06/2019 12/05/2019 12/04/2019  WBC 4.0 - 10.5 K/uL 5.3 4.1 5.4  Hemoglobin 12.0 - 15.0 g/dL 11.6(L) 11.7(L) 12.0  Hematocrit 36.0 - 46.0 % 36.0 37.1 37.8  Platelets 150 - 400 K/uL 244 222 261   CMP Latest Ref Rng & Units 12/06/2019 12/05/2019 12/05/2019  Glucose 70 - 99 mg/dL 75 64(L) 72  BUN 8 - 23 mg/dL 37(H) 44(H) 58(H)  Creatinine 0.44 - 1.00 mg/dL 1.66(H) 2.02(H) 3.00(H)  Sodium 135 - 145 mmol/L 145 143 141  Potassium 3.5 - 5.1 mmol/L 3.7 3.7 3.5  Chloride 98 - 111 mmol/L 107 107 102  CO2 22 - 32 mmol/L 26 22 25   Calcium 8.9 - 10.3 mg/dL 9.4 9.4 9.2  Total Protein 6.5 - 8.1 g/dL - - -  Total Bilirubin 0.3 - 1.2 mg/dL - - -  Alkaline Phos 38 - 126 U/L - - -  AST 15 - 41 U/L - - -  ALT 0 - 44 U/L - - -    Physical Exam  Constitutional: She is oriented to person, place, and time and well-developed, well-nourished, and in no distress. Vital signs are normal. No distress.  HENT:  Head: Normocephalic and atraumatic.  Eyes: Conjunctivae and EOM are normal.  Cardiovascular: Normal rate, regular rhythm, normal heart sounds and intact distal pulses.  Pulmonary/Chest: Effort normal and breath sounds normal. No respiratory distress. She has no wheezes. She has no rales.  Abdominal: Soft. Bowel sounds are normal. She exhibits no  distension. There is no abdominal tenderness. There is no rebound.     Musculoskeletal:        General: No tenderness or edema. Normal range of motion.     Comments: Right leg appears to be larger in diameter than left (chronic)  Neurological: She is alert and oriented to person, place, and time.  Dysarthria improved  Strength 5/5 in BUE+BLE CN II-XII grossly intact Sensation grossly intact in all extremities  Skin: Skin is warm and dry. She is not diaphoretic.    Assessment/Plan: Ms. Encinas is a 69 yr old female with PMHx of CAD, HTN, HLD, asthma, OSA, obesity, GERD, chronic pain from fibromyalgia, lower extremity venous stasis, bipolar depression with history of recurrent falls presenting with AMS and AKI.   Altered mental status 2/2 AKI vs Acute left frontal infarct: Patient presented with increased confusion from SNF with lab work concerning for AKI with increased BUN.  Due to concerns for stroke MRI obtained which showed a acute subcentimeter infarction affecting the left frontal white matter.  Stroke work-up initiated. 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  On echo, normal left ventricular ejection fraction.  No regional wall motion abnormalities noted.  Carotid ultrasound without significant stenosis. -Neurology consulted, appreciate their recommendations -Continue maintenance IV fluids for AKI -Holding centrally acting medications -Aspirin, atorvastatin for secondary stroke prevention -BMP daily -Strict I&O -Daily weights -Avoid nephrotoxic medications  Chronic venous insufficiency: Patient with chronic venous insufficiency of right lower extremity. She has been treated with lasix and torsemide in the past. However, this may be contributing to her altered mental status  and AKI.  - Hold off on diuretics - Recommend for compression stocking and unna boots  Dispo planning: Extensive discussion with patient's daughters at bedside.  Daughters note that they are not satisfied with the care that she is receiving at her current SNF. They note that today she is not receiving the medical attention that she needs.  And they are requesting that she be discharged to a different facility -Transitions of care consult placed  Prior to Admission Living Arrangement: SNF Anticipated Discharge Location: SNF  Barriers to Discharge: Ongoing medical evaluation and treatment  Dispo: Anticipated discharge in approximately 1-2 day(s).   Harvie Heck, MD  Internal Medicine, PGY-1 12/06/2019, 3:36 PM Pager: [phone]

## 2019-12-06 NOTE — Progress Notes (Signed)
Daughters at the bedside with several questions. Paged MD to call or come to bedside to update family.

## 2019-12-06 NOTE — Progress Notes (Signed)
  Echocardiogram 2D Echocardiogram has been performed.  Alisha [name] 12/06/2019, 12:46 PM

## 2019-12-06 NOTE — Evaluation (Signed)
Occupational Therapy Evaluation Patient Details Name: Alisha [name] MRN: [id number] DOB: 1951/05/04 Today's Date: 12/06/2019    History of Present Illness Alisha [name] is a 69 y.o. 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 Work up in ED revealed AKI. MRI with small left frontal white matter infarction.   Clinical Impression   Pt admitted with the above diagnoses and presents with below problem list. Pt will benefit from continued acute OT to address the below listed deficits and maximize independence with basic ADLs prior to d/c to venue below. At baseline, pt is mod I with basic ADLs, h/o falls. Pt presents with deficits in cognition, behavior, balance, strength, and activity tolerance impacting assist level with ADLs. Currently pt is max +2 A for LB ADLs in sit<>stand, mod A with UB ADLs. Unable to advance transfers beyond sit<>stand this session despite pt's insistence that she could walk to the bathroom. Pt becoming somewhat agitated when told she could not safely attempt walking to the bathroom at this time even with +2 assist. By end of session, pt with calmer demeanor, pleasant, appeared to be resting comfortably in bed     Follow Up Recommendations  SNF    Equipment Recommendations  Other (comment)(defer to next venue)    Recommendations for Other Services       Precautions / Restrictions Precautions Precautions: Fall Restrictions Weight Bearing Restrictions: No      Mobility Bed Mobility Overal bed mobility: Needs Assistance Bed Mobility: Supine to Sit;Sit to Supine     Supine to sit: Min guard Sit to supine: Mod assist    General bed mobility comments: Increased time to sit up on edge of bed. ModA for BLE negotiation back into bed. +2 to boost up in bed and reposition,.  Transfers Overall transfer level: Needs assistance Equipment used: Rolling walker (2 wheeled) Transfers: Sit to/from Stand Sit to Stand: Max assist;+2 physical assistance         General transfer comment: 3x sit<>stand from elevated EOB height. able to stand only briefly and statically. max cueing for safety.     Balance Overall balance assessment: Needs assistance Sitting-balance support: Feet supported Sitting balance-Leahy Scale: Fair     Standing balance support: Bilateral upper extremity supported Standing balance-Leahy Scale: Zero Standing balance comment: rw and +2 max A to static stand                           ADL either performed or assessed with clinical judgement   ADL Overall ADL's : Needs assistance/impaired Eating/Feeding: Set up   Grooming: Moderate assistance;Sitting   Upper Body Bathing: Moderate assistance;Sitting   Lower Body Bathing: Maximal assistance;+2 for physical assistance;Sit to/from stand   Upper Body Dressing : Moderate assistance;Sitting   Lower Body Dressing: Maximal assistance;+2 for physical assistance;Sit to/from Health and safety inspector Details (indicate cue type and reason): unable to progress beyond static standing this session           General ADL Comments: Pt completed bed mobility, sat EOB a few of minutes with 3x sit<>stands completed with  max +2 assist. Pt impulsively initiating scooting hips out EOB and attempting standing. max directional cues and cues for safety. Pt becoming agitated when told  it was not safe for her to try to walk to the bathroom. Calmer demeanor at end of session with pt thanking nurse and OT for help.     Vision         Perception     Praxis      Pertinent Vitals/Pain Pain Assessment: No/denies pain     Hand Dominance Right    Extremity/Trunk Assessment Upper Extremity Assessment Upper Extremity Assessment: Generalized weakness;Difficult to assess due to impaired cognition   Lower Extremity Assessment Lower Extremity Assessment: Defer to PT evaluation   Cervical / Trunk Assessment Cervical / Trunk Assessment: Other exceptions Cervical / Trunk Exceptions: increased body habitus   Communication Communication Communication: No difficulties   Cognition Arousal/Alertness: Awake/alert Behavior During Therapy: Impulsive;Agitated;WFL for tasks assessed/performed Overall Cognitive Status: Impaired/Different from baseline Area of Impairment: Memory;Safety/judgement;Awareness;Problem solving;Attention;Following commands;Orientation                 Orientation Level: Situation;Place Current Attention Level: Focused Memory: Decreased short-term memory Following Commands: Follows one step commands inconsistently Safety/Judgement: Decreased awareness of safety;Decreased awareness of deficits Awareness: Intellectual Problem Solving: Difficulty sequencing;Requires verbal cues;Requires tactile cues General Comments: Pt insisting she could walk to the bathroom while needing max +2 assist to stand briefly from EOB. Decreased safety awareness   General Comments       Exercises     Shoulder Instructions      Home Living Family/patient expects to be discharged to:: Private residence Living Arrangements: Alone Available Help at Discharge: Family Type of Home: Apartment Home Access: Elevator     Home Layout: One level     Bathroom Shower/Tub: Occupational psychologist: Standard Bathroom Accessibility: Yes How Accessible: Accessible via walker Home Equipment: [place] - 2 wheels;Wheelchair - manual;Shower seat - built in;Grab bars - toilet;Grab bars - tub/shower   Additional Comments: sleeps in a recliner      Prior Functioning/Environment Level of Independence: Independent with assistive  device(s)        Comments: Uses walker, takes [employer] for transportation. Reports 4 falls in past 3 months         OT Problem List: Decreased strength;Decreased activity tolerance;Impaired balance (sitting and/or standing);Decreased cognition;Decreased safety awareness;Decreased knowledge of use of DME or AE;Decreased knowledge of precautions;Obesity;Pain      OT Treatment/Interventions: Self-care/ADL training;Therapeutic exercise;DME and/or AE instruction;Therapeutic activities;Cognitive remediation/compensation;Patient/family education;Balance training    OT Goals(Current goals can be found in the care plan section) Acute Rehab OT Goals Patient Stated Goal: to have a bowel movement on the toilet OT Goal Formulation: With patient Time For Goal Achievement: 12/20/19 Potential to Achieve Goals: Good ADL Goals Pt Will Perform Grooming: with set-up;sitting Pt Will Perform Upper Body Bathing: with set-up;sitting Pt Will Perform Lower Body Bathing: with mod assist;sit to/from stand Pt Will Transfer to Toilet: stand pivot transfer;with max assist;squat pivot transfer Pt Will Perform Toileting - Clothing Manipulation and hygiene: with max assist;sitting/lateral leans;sit to/from stand Additional ADL Goal #1: Pt will complete bed mobility at min guard level to prepare for OOB/EOB ADLs.  OT Frequency: Min 2X/week   Barriers to D/C:            Co-evaluation              AM-PAC OT "6 Clicks" Daily Activity     Outcome Measure Help from another person eating meals?: A Little Help from another person taking care of personal grooming?: A Lot Help from another person toileting, which includes using toliet, bedpan, or  urinal?: Total Help from another person bathing (including washing, rinsing, drying)?: Total Help from another person to put on and taking off regular upper body clothing?: A Lot Help from another person to put on and taking off regular lower body clothing?: Total 6 Click  Score: 10   End of Session Equipment Utilized During Treatment: Gait belt;Rolling walker Nurse Communication: Other (comment)(nurse present throughout session)  Activity Tolerance: Treatment limited secondary to agitation;Patient limited by fatigue Patient left: in bed;with call bell/phone within reach;with bed alarm set  OT Visit Diagnosis: Unsteadiness on feet (R26.81);Other abnormalities of gait and mobility (R26.89);Muscle weakness (generalized) (M62.81);History of falling (Z91.81);Pain;Other symptoms and signs involving cognitive function                Time: [id number] OT Time Calculation (min): 15 min Charges:  OT General Charges $OT Visit: 1 Visit OT Evaluation $OT Eval Low Complexity: [place], OT Acute Rehabilitation Services Pager: [phone] Office: [phone]   Hortencia Pilar 12/06/2019, 4:34 PM

## 2019-12-06 NOTE — Progress Notes (Signed)
VASCULAR LAB PRELIMINARY  PRELIMINARY  PRELIMINARY  PRELIMINARY  Carotid duplex completed.    Preliminary report:   See CV proc for preliminary results.   Deaundre Allston, RVT 12/06/2019, 10:19 AM

## 2019-12-06 NOTE — Progress Notes (Signed)
Confirmed with teaching service, patient does not need stroke scale assessment. Will implement neuro checks q4.

## 2019-12-07 LAB — CBC
HCT: 35.9 % — ABNORMAL LOW (ref 36.0–46.0)
Hemoglobin: 11.3 g/dL — ABNORMAL LOW (ref 12.0–15.0)
MCH: 27.8 pg (ref 26.0–34.0)
MCHC: 31.5 g/dL (ref 30.0–36.0)
MCV: 88.2 fL (ref 80.0–100.0)
Platelets: 245 K/uL (ref 150–400)
RBC: 4.07 MIL/uL (ref 3.87–5.11)
RDW: 13.6 % (ref 11.5–15.5)
WBC: 6 K/uL (ref 4.0–10.5)
nRBC: 0 % (ref 0.0–0.2)

## 2019-12-07 LAB — BASIC METABOLIC PANEL
Anion gap: 9 (ref 5–15)
BUN: 16 mg/dL (ref 8–23)
CO2: 25 mmol/L (ref 22–32)
Calcium: 9.1 mg/dL (ref 8.9–10.3)
Chloride: 111 mmol/L (ref 98–111)
Creatinine, Ser: 1.1 mg/dL — ABNORMAL HIGH (ref 0.44–1.00)
GFR calc Af Amer: 59 mL/min — ABNORMAL LOW (ref >60)
GFR calc non Af Amer: 51 mL/min — ABNORMAL LOW (ref >60)
Glucose, Bld: 89 mg/dL (ref 70–99)
Potassium: 3.3 mmol/L — ABNORMAL LOW (ref 3.5–5.1)
Sodium: 145 mmol/L (ref 135–145)

## 2019-12-07 LAB — RENAL FUNCTION PANEL
Albumin: 2.8 g/dL — ABNORMAL LOW (ref 3.5–5.0)
Anion gap: 12 (ref 5–15)
BUN: 18 mg/dL (ref 8–23)
CO2: 25 mmol/L (ref 22–32)
Calcium: 9.3 mg/dL (ref 8.9–10.3)
Chloride: 109 mmol/L (ref 98–111)
Creatinine, Ser: 1.18 mg/dL — ABNORMAL HIGH (ref 0.44–1.00)
GFR calc Af Amer: 54 mL/min — ABNORMAL LOW (ref >60)
GFR calc non Af Amer: 47 mL/min — ABNORMAL LOW (ref >60)
Glucose, Bld: 83 mg/dL (ref 70–99)
Phosphorus: 1.9 mg/dL — ABNORMAL LOW (ref 2.5–4.6)
Potassium: 3.6 mmol/L (ref 3.5–5.1)
Sodium: 146 mmol/L — ABNORMAL HIGH (ref 135–145)

## 2019-12-07 MED ORDER — POLYETHYLENE GLYCOL 3350 17 G PO PACK
17.0000 g | PACK | Freq: Every day | ORAL | Status: DC
  Administered 2019-12-07 – 2019-12-11 (×5): 17 g via ORAL
  Filled 2019-12-07 (×5): qty 1

## 2019-12-07 MED ORDER — SODIUM CHLORIDE 0.9 % IV BOLUS
1000.0000 mL | Freq: Once | INTRAVENOUS | Status: AC
  Administered 2019-12-07: 1000 mL via INTRAVENOUS

## 2019-12-07 MED ORDER — WHITE PETROLATUM EX OINT
TOPICAL_OINTMENT | CUTANEOUS | Status: AC
  Administered 2019-12-07: 0.2
  Filled 2019-12-07: qty 28.35

## 2019-12-07 MED ORDER — POTASSIUM CHLORIDE CRYS ER 20 MEQ PO TBCR
40.0000 meq | EXTENDED_RELEASE_TABLET | Freq: Once | ORAL | Status: AC
  Administered 2019-12-07: 40 meq via ORAL
  Filled 2019-12-07: qty 2

## 2019-12-07 MED ORDER — SENNOSIDES-DOCUSATE SODIUM 8.6-50 MG PO TABS
1.0000 | ORAL_TABLET | Freq: Once | ORAL | Status: AC
  Administered 2019-12-07: 22:00:00 1 via ORAL
  Filled 2019-12-07: qty 1

## 2019-12-07 NOTE — Progress Notes (Signed)
   Subjective: Patient was seen and evaluated at bedside during morning rounds this morning. No acute events overnight.  We talked about her kidney function has been improving by holding diuretic, we again discussed she was already rest on arrival and this caused her symptoms.  She is aware that she should stop Lasix at discharge and use compression stocking for her lower extremity swelling instead.   Objective:  Vital signs in last 24 hours: Vitals:   12/06/19 1405 12/06/19 1828 12/06/19 2024 12/07/19 0441  BP: (!) 140/93 (!) 160/69 (!) 153/73 (!) 155/76  Pulse: 70 68 67 67  Resp: 20 19 17 16   Temp: 97.6 F (36.4 C) 98 F (36.7 C) 98.1 F (36.7 C) 98.2 F (36.8 C)  TempSrc: Oral Oral Oral Oral  SpO2: 98% 100% 100% 100%  Weight:        Constitutional: Obese lady. No acute distress.  Head: Normocephalic and atraumatic.  Eyes: Conjunctivae are normal, EOM nl Cardiovascular:  RRR, nl S1S2, no murmur, right lower extremity is (chronically) larger in diameter than left, no tenderness, no erythema Respiratory: Effort normal and breath sounds normal. No respiratory distress. No wheezes.  GI: Soft. Bowel sounds are normal. No distension. There is no tenderness.  Neurological: Is alert and oriented x 3, no focal deficit Skin: Not diaphoretic. No erythema.  Psychiatric:  Normal mood and affect. Behavior is normal. Judgment and thought content normal.    Assessment/Plan:  Principal Problem:   AKI (acute kidney injury) ([place]) Active Problems:   Essential hypertension   OSA (obstructive sleep apnea)   Polypharmacy   Coronary artery disease involving native coronary artery of native heart without angina pectoris   Fibromyalgia   Alisha [name] is a 69 yr old female with PMHx of CAD, HTN, HLD, asthma, OSA, obesity, GERD, chronic pain from fibromyalgia, lower extremity venous stasis, bipolar depression with history of recurrent falls presenting with AMS and AKI.   Altered mental  status: AKI: Her altered mental status was likely multifactorial in setting of polypharmacy and AKI secondary to overdiuresis.  -He has been on Lasix at home for venous insufficiency.  This is likely the reason for her AKI and dehydration.  This is her second hospital admission due to overdiuresis -Discontinued Lasix -Patient is alert and oriented at baseline -Creatinine improved -Medically stable to be discharged -[facility] consulted for arrangement as patient and family do not want to come back to prior nursing facility.  Appreciate recommendations. -BMP daily while in hospital -Strict intake output and daily weights while in hospital  Lower extremity edema secondary to venous insufficiency: -Discontinue Lasix -Recommending compression stocking for lower extremity swelling  Prior to Admission Living Arrangement: SNF Anticipated Discharge Location: SNF Barriers to Discharge: Patient and family insist to be discharged to another nursing facility.     Dispo: Anticipated discharge pending SNF placement  Dewayne Hatch, MD 12/07/2019, 6:16 AM Pager: @[phone] @

## 2019-12-07 NOTE — Evaluation (Signed)
Speech Language Pathology Evaluation Patient Details Name: Kierrah Rotherham MRN: [id number] DOB: 12/19/1950 Today's Date: 12/07/2019 Time: [id number] SLP Time Calculation (min) (ACUTE ONLY): 22 min  Problem List:  Patient Active Problem List   Diagnosis Date Noted  . Cerebrovascular accident (CVA) ([place])   . Altered mental status   . Fall   . AKI (acute kidney injury) ([place]) 11/03/2019  . Recurrent falls 09/22/2018  . Chronic use of opiate drug for therapeutic purpose 04/11/2018  . Bradycardia 04/11/2018  . Chronic fatigue 04/11/2018  . Prediabetes 12/17/2017  . Mild cognitive impairment 12/13/2017  . Cough variant asthma vs UACS 10/26/2017  . Upper airway cough syndrome 10/25/2017  . Medial epicondylitis, right 09/16/2017  . Right shoulder pain 09/07/2017  . Lateral epicondylitis of right elbow 09/07/2017  . Vitamin D deficiency 07/13/2017  . History of foot fracture 07/12/2017  . History of colon polyps 07/12/2017  . Cataract of both eyes 07/11/2017  . Hypertensive retinopathy of both eyes 07/11/2017  . Fibromyalgia 05/17/2017  . Tenosynovitis, wrist 05/14/2017  . Chronic cough 05/02/2017  . Foot pain, left 05/02/2017  . Dysphagia 03/23/2017  . Health care maintenance 03/23/2017  . Coronary artery disease involving native coronary artery of native heart without angina pectoris 01/11/2017  . Hyperlipidemia 01/11/2017  . Polypharmacy 11/25/2016  . Venous stasis of both lower extremities 10/04/2016  . Poor dentition 10/04/2016  . OSA (obstructive sleep apnea) 10/04/2016  . History of nephrolithiasis 10/04/2016  . Hx of pilonidal cyst 10/04/2016  . Bipolar depression ([place]) 10/04/2016  . Personal history of DVT (deep vein thrombosis) 10/04/2016  . Morbid obesity due to excess calories ([place]) 09/25/2016  . History of pituitary adenoma s/p resection 09/25/2016  . Essential hypertension 09/25/2016  . Psoriatic arthritis ([place]) 09/25/2016  . Vision impairment 09/25/2016  .  Chronic seasonal allergic rhinitis due to pollen 09/25/2016  . Moderate persistent asthma without complication [id number]  . Gastroesophageal reflux disease without esophagitis 09/25/2016  . Urge incontinence 09/25/2016  . Chronic pain syndrome 09/25/2016  . Candidal intertrigo 05/10/2010   Past Medical History:  Past Medical History:  Diagnosis Date  . Arthritis   . Asthma   . Bipolar disorder ([place])   . CAD (coronary artery disease) 07/07/2016   a. s/p stenting x 2 in [place] to the LAD, RCA is chronically occluded, LVEDP was 34  . Cancer ([facility])    cervical  . Depression   . Fibromyalgia   . GERD (gastroesophageal reflux disease)   . History of benign pituitary tumor   . History of kidney stones    1972  . Hyperlipidemia   . Hypertension   . Migraine   . Morbid obesity ([place])   . Peripheral vascular disease ([place])   . Pneumonia   . Seizures ([place])    during surgery for pituitary tumor  . Sleep apnea    with cpap  . Vertigo    Past Surgical History:  Past Surgical History:  Procedure Laterality Date  . ANKLE FUSION Bilateral   . carpel tunnel release Bilateral 2017  . CORONARY ANGIOPLASTY    . ESOPHAGOGASTRODUODENOSCOPY (EGD) WITH PROPOFOL N/A 06/14/2017   Procedure: ESOPHAGOGASTRODUODENOSCOPY (EGD) WITH PROPOFOL;  Surgeon: Wilford Corner, MD;  Location: [facility];  Service: Endoscopy;  Laterality: N/A;  . EXCISION / CURETTAGE BONE CYST PHALANGES OF FOOT  2014   Removal of foot cyst   . PARTIAL HYSTERECTOMY  unknown   Patient still has ovaries  . pituitary tumor removal  1999  removed as much as they could, on optic nerve  . TONSILLECTOMY    . TOTAL KNEE ARTHROPLASTY     TKR X 3  2 on the left and 1 on the right   HPI:  Shenette Holst is a 69 y.o. female with past medical history significant for hypertension, coronary artery disease, hyperlipidemia, bipolar disorder, hyperlipidemia, migraine, morbid obesity, seizures, pituitary tumor status post resection with  a right frontal encephalomalacia, polypharmacy presents the emergency department with episodes of confusion over the last few days and hallucinations/delusions. Work up in ED revealed AKI. MRI with small left frontal white matter infarction.   Assessment / Plan / Recommendation Clinical Impression  Pt reports she did live with daughter until she moved to her apartment and now lives alone. She states "my words have been down for the past month now." Assessment of cognitve-linguistic abilities showed impairments in working memory primarily in retrieval of new information, safety awareness, reasoning, complex problem solving and complex/multistep command following. In conversational dialogue pt exhibited dysnomia, mildly decreased semantics, hesitations as well as mild dysarthria. Recommend further ST to address deficits and increase safety awareness at rehab facility. Will generate goals and work with pt as able in inpatient,.     SLP Assessment  SLP Recommendation/Assessment: Patient needs continued Speech Lanaguage Pathology Services SLP Visit Diagnosis: Cognitive communication deficit (R41.841)    Follow Up Recommendations  Skilled Nursing facility    Frequency and Duration min 2x/week  2 weeks      SLP Evaluation Cognition  Overall Cognitive Status: Impaired/Different from baseline Arousal/Alertness: Awake/alert Orientation Level: Other (comment);Oriented to person;Oriented to place;Oriented to situation;Disoriented to time Attention: Sustained Sustained Attention: Appears intact Memory: Impaired Memory Impairment: Storage deficit;Retrieval deficit Awareness: Impaired Awareness Impairment: Anticipatory impairment;Emergent impairment Problem Solving: Impaired Problem Solving Impairment: Verbal complex;Functional complex;Verbal basic Executive Function: Reasoning;Decision Making Reasoning: Impaired Reasoning Impairment: Verbal basic Decision Making: Impaired Decision Making  Impairment: Verbal basic;Functional basic Safety/Judgment: Impaired       Comprehension  Auditory Comprehension Overall Auditory Comprehension: Impaired Yes/No Questions: Within Functional Limits Commands: Impaired Complex Commands: 50-74% accurate Interfering Components: Working Curator: Not tested Reading Comprehension Reading Status: Not tested    Expression Expression Primary Mode of Expression: Verbal Verbal Expression Overall Verbal Expression: Impaired Initiation: Impaired Level of Generative/Spontaneous Verbalization: Conversation Repetition: No impairment Naming: Impairment Responsive: 76-100% accurate Confrontation: Impaired(5/6) Pragmatics: No impairment Written Expression Dominant Hand: Right Written Expression: Not tested   Oral / Motor  Oral Motor/Sensory Function Overall Oral Motor/Sensory Function: Within functional limits Motor Speech Overall Motor Speech: Impaired Respiration: Within functional limits Phonation: Normal Resonance: Within functional limits Articulation: Impaired Level of Impairment: Sentence Intelligibility: Intelligible Word: 75-100% accurate Phrase: 75-100% accurate Sentence: 75-100% accurate Conversation: 75-100% accurate Motor Planning: Witnin functional limits   GO                    Houston Siren 12/07/2019, 9:37 AM  Orbie Pyo Colvin Caroli.Ed Risk analyst [phone] Office [phone]

## 2019-12-07 NOTE — Progress Notes (Signed)
NEURO HOSPITALIST PROGRESS NOTE   Subjective: Awake, alert, in bed , NAD. No family at bedside.  Exam: Vitals:   12/06/19 2024 12/07/19 0441  BP: (!) 153/73 (!) 155/76  Pulse: 67 67  Resp: 17 16  Temp: 98.1 F (36.7 C) 98.2 F (36.8 C)  SpO2: 100% 100%    Physical Exam  Constitutional: Appears well-developed and well-nourished.  Psych: Affect appropriate to situation Eyes: Normal external eye and conjunctiva. HENT: Normocephalic, no lesions, without obvious abnormality.   Musculoskeletal-no joint tenderness, deformity or swelling Cardiovascular: Normal rate and regular rhythm.  Respiratory: Effort normal, non-labored breathing saturations WNL GI: Soft.  No distension. There is no tenderness.  Skin: WDI   Neuro:  Mental Status: Alert, oriented, thought content appropriate.  Speech fluent without evidence of aphasia.  Able to follow  commands without difficulty. Cranial Nerves: II:  Visual fields grossly normal,  III,IV, VI: ptosis not present, extra-ocular motions intact bilaterally pupils equal, round, reactive to light and accommodation V,VII: smile symmetric, facial light touch sensation normal bilaterally VIII: hearing normal bilaterally IX,X: uvula rises symmetrically XI: bilateral shoulder shrug XII: midline tongue extension Motor: Right : Upper extremity   5/5  Left:     Upper extremity   5/5  Lower extremity   4+/5 Lower extremity   4+/5 Tone and bulk:normal tone throughout; no atrophy noted Sensory: cool temp and light touch intact throughout, bilaterally Deep Tendon Reflexes: 2+ and symmetric biceps, patella Plantars: Right: downgoing   Left: downgoing Cerebellar: normal finger-to-nose, normal rapid alternating movements and normal heel-to-shin test Gait: deferred    Medications:  Scheduled: .  stroke: mapping our early stages of recovery book   Does not apply Once  . aspirin EC  81 mg Oral Daily  . atorvastatin  80 mg Oral QHS   . Chlorhexidine Gluconate Cloth  6 each Topical Daily  . gabapentin  300 mg Oral TID  . heparin  5,000 Units Subcutaneous Q8H  . loratadine  10 mg Oral Daily  . pantoprazole  40 mg Oral BID  . sodium chloride flush  10-40 mL Intracatheter Q12H  . umeclidinium bromide  2 puff Inhalation Daily   Continuous: . lactated ringers 100 mL/hr at 12/07/19 1023   [id number], albuterol, sodium chloride flush  Pertinent Labs/Diagnostics:   EEG  Result Date: 12/05/2019 Lora Havens, MD     12/05/2019  4:04 PM Patient Name: Sarynity Burgeson MRN: [id number] Epilepsy Attending: Lora Havens Referring Physician/Provider: Dr Harvie Heck Date: 12/05/2019 Duration: 23.55 mins Patient history: 69 year old female with altered mental status.  EEG evaluate for seizures. Level of alertness: Awake AEDs during EEG study: Gabapentin Technical aspects: This EEG study was done with scalp electrodes positioned according to the 10-20 International system of electrode placement. Electrical activity was acquired at a sampling rate of 500Hz  and reviewed with a high frequency filter of 70Hz  and a low frequency filter of 1Hz . EEG data were recorded continuously and digitally stored. Description: During awake state, no clear posterior dominant was seen.  EEG showed continuous generalized 5 to 7 Hz theta slowing.  Hyperventilation and photic stimulation were not performed. Abnormality -Continuous slow, generalized IMPRESSION: This study is suggestive of mild to moderate diffuse encephalopathy, nonspecific to etiology. No seizures or epileptiform discharges were seen throughout the recording. Lora Havens   MR ANGIO HEAD WO CONTRAST  Result Date: 12/05/2019 CLINICAL  DATA:  69 year old female found to have a small left anterior frontal lobe white matter infarct on MRI earlier today. 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 EXAM: MRA HEAD WITHOUT CONTRAST TECHNIQUE: Angiographic  images of the Circle of Willis were obtained using MRA technique without intravenous contrast. COMPARISON:  Brain MRI earlier today, 10/15/2018. FINDINGS: Patent but diminutive posterior circulation with antegrade flow in codominant distal vertebral arteries. No distal vertebral stenosis. Patent vertebrobasilar junction with mild irregularity but no significant basilar stenosis. Patent SCA and fetal type bilateral PCA origins. There are bilateral P1 segments present. Distal PCA branches are within normal limits; suspect some artifactual signal loss in the right P3 superior division. Antegrade flow in both ICA siphons. The right siphon is mildly irregular but without significant stenosis. Normal right ophthalmic and posterior communicating artery origins. Patent right ICA terminus. The left siphon demonstrates smooth circumferential narrowing in the distal petrous and proximal cavernous segment which might be tumor related. Overall the degree of stenosis is mild-to-moderate. The distal left siphon then has a normal appearance. Normal left posterior communicating artery origin. Patent left ICA terminus. Tortuous ACA A1 segments. Mild stenosis at the left A1 origin. Visible ACA branches are within normal limits. Normal left MCA M1 and bifurcation. The right M1 bifurcates early and there is suggestion of a 2 mm aneurysm directed superiorly and laterally at the bifurcation (series 253, image 10). There is also regional right frontotemporal parenchymal encephalomalacia in this region. No proximal MCA branch occlusion is identified. IMPRESSION: 1. Negative for large vessel occlusion. 2. 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 3. 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 Given the small size surveillance MRA (annual or biennial) may be the best management strategy. Alternatively Neurosurgery and/or Neuro-Interventional  Radiology consultation could be pursued to evaluate the appropriateness of potential treatment. 4. Posterior circulation is diminutive on the basis of fetal type PCA origins, with no definite hemodynamically significant stenosis. Electronically Signed   By: Genevie Ann M.D.   On: 12/05/2019 18:15   MR BRAIN WO CONTRAST  Result Date: 12/05/2019 CLINICAL DATA:  Subacute altered mental status. Previous surgery for pituitary tumor. EXAM: MRI HEAD WITHOUT CONTRAST TECHNIQUE: Multiplanar, multiecho pulse sequences of the brain and surrounding structures were obtained without intravenous contrast. COMPARISON:  Head CT 11/03/2019.  MRI 10/15/2018. FINDINGS: Brain: Diffusion imaging shows a subcentimeter acute infarction in the left frontal white matter adjacent to the frontal horn of the lateral ventricle. Old small vessel ischemic changes affect the pons and cerebellum. Chronic atrophy, encephalomalacia and gliosis at the base of the brain on the right affecting the frontal and temporal lobe. Old small vessel infarctions of the thalami and basal ganglia. No mass lesion, hemorrhage, hydrocephalus or extra-axial collection. Small amount of residual pituitary tumor previously shown in the left cavernous sinus is difficult to demonstrate on this standard unenhanced brain exam. Vascular: Major vessels at the base of the brain show flow. Skull and upper cervical spine: Pterional craniotomy on the right. Sinuses/Orbits: Retention cyst in the right maxillary sinus. Sinuses otherwise clear. Orbits negative. Other: None IMPRESSION: Acute subcentimeter infarction affecting the left frontal white matter adjacent to the frontal horn of the lateral ventricle. Previous craniotomy for pituitary tumor resection. Atrophy, encephalomalacia and gliosis at the base of the brain on the right presumably subsequent to that. Small amount of residual pituitary tumor in the  left cavernous sinus, better shown on previous detailed contrast enhanced  exams. Extensive chronic small-vessel ischemic changes elsewhere affecting the brainstem, cerebellum, thalami, basal ganglia and hemispheric white matter. Electronically Signed   By: Nelson Chimes M.D.   On: 12/05/2019 13:38   ECHOCARDIOGRAM COMPLETE  Result Date: 12/06/2019    ECHOCARDIOGRAM REPORT   Patient Name:   [facility] Date of Exam: 12/06/2019 Medical Rec #:  [id number]         Height:       66.0 in Accession #:    [id number]        Weight:       269.0 lb Date of Birth:  [birth year]         BSA:          2.268 m Patient Age:    58 years          BP:           169/75 mmHg Patient Gender: F                 HR:           68 bpm. Exam Location:  Inpatient Procedure: 2D Echo, Cardiac Doppler and Color Doppler Indications:    Stroke  History:        Patient has no prior history of Echocardiogram examinations.                 CAD, Abnormal ECG, Signs/Symptoms:Altered Mental Status; Risk                 Factors:Sleep Apnea, Hypertension and Dyslipidemia. Poly                 pharmacy.  Sonographer:    Roseanna Rainbow RDCS Referring Phys: [id number] Velna Ochs  Sonographer Comments: Technically difficult study due to poor echo windows and patient is morbidly obese. Image acquisition challenging due to uncooperative patient and Image acquisition challenging due to patient body habitus. Patient had trouble following directions. IMPRESSIONS  1. Left ventricular ejection fraction, by estimation, is 65 to 70%. The left ventricle has normal function. The left ventricle has no regional wall motion abnormalities. There is mild left ventricular hypertrophy of the basal-septal segment. Left ventricular diastolic parameters are indeterminate.  2. Right ventricular systolic function is normal. The right ventricular size is normal. There is moderately elevated pulmonary artery systolic pressure. The estimated right ventricular systolic pressure is [id number] mmHg.  3. The mitral valve is normal in structure. Trivial mitral valve  regurgitation. No evidence of mitral stenosis.  4. Tricuspid valve regurgitation is moderate.  5. The aortic valve is normal in structure. Aortic valve regurgitation is not visualized. No aortic stenosis is present.  6. The inferior vena cava is normal in size with greater than 50% respiratory variability, suggesting right atrial pressure of 3 mmHg. FINDINGS  Left Ventricle: Left ventricular ejection fraction, by estimation, is 65 to 70%. The left ventricle has normal function. The left ventricle has no regional wall motion abnormalities. The left ventricular internal cavity size was normal in size. There is  mild left ventricular hypertrophy of the basal-septal segment. Left ventricular diastolic parameters are indeterminate. Normal left ventricular filling pressure. Right Ventricle: The right ventricular size is normal. No increase in right ventricular wall thickness. Right ventricular systolic function is normal. There is moderately elevated pulmonary artery systolic pressure. The tricuspid regurgitant velocity is 3.14 m/s, and with an assumed right atrial pressure of 3 mmHg, the estimated right ventricular systolic  pressure is 42.4 mmHg. Left Atrium: Left atrial size was normal in size. Right Atrium: Right atrial size was normal in size. Pericardium: There is no evidence of pericardial effusion. Mitral Valve: The mitral valve is normal in structure. Normal mobility of the mitral valve leaflets. Trivial mitral valve regurgitation. No evidence of mitral valve stenosis. Tricuspid Valve: The tricuspid valve is normal in structure. Tricuspid valve regurgitation is moderate . No evidence of tricuspid stenosis. Aortic Valve: The aortic valve is normal in structure. Aortic valve regurgitation is not visualized. No aortic stenosis is present. Pulmonic Valve: The pulmonic valve was normal in structure. Pulmonic valve regurgitation is not visualized. No evidence of pulmonic stenosis. Aorta: The aortic root is normal in  size and structure. Venous: The inferior vena cava is normal in size with greater than 50% respiratory variability, suggesting right atrial pressure of 3 mmHg. IAS/Shunts: No atrial level shunt detected by color flow Doppler.  LEFT VENTRICLE PLAX 2D LVIDd:         4.70 cm     Diastology LVIDs:         2.90 cm     LV e' lateral:   10.40 cm/s LV PW:         1.00 cm     LV E/e' lateral: 7.6 LV IVS:        1.30 cm     LV e' medial:    6.09 cm/s LVOT diam:     1.70 cm     LV E/e' medial:  12.9 LV SV:         64 LV SV Index:   28 LVOT Area:     2.27 cm  LV Volumes (MOD) LV vol d, MOD A2C: 55.8 ml LV vol d, MOD A4C: 49.3 ml LV vol s, MOD A2C: 11.3 ml LV vol s, MOD A4C: 12.1 ml LV SV MOD A2C:     44.5 ml LV SV MOD A4C:     49.3 ml LV SV MOD BP:      41.3 ml RIGHT VENTRICLE RV S prime:     10.70 cm/s TAPSE (M-mode): 2.3 cm LEFT ATRIUM             Index       RIGHT ATRIUM           Index LA diam:        4.40 cm 1.94 cm/m  RA Area:     14.80 cm LA Vol (A2C):   44.1 ml 19.44 ml/m RA Volume:   37.10 ml  16.36 ml/m LA Vol (A4C):   31.3 ml 13.80 ml/m LA Biplane Vol: 39.7 ml 17.50 ml/m  AORTIC VALVE LVOT Vmax:   128.00 cm/s LVOT Vmean:  82.700 cm/s LVOT VTI:    0.282 m  AORTA Ao Root diam: 2.90 cm Ao Asc diam:  3.50 cm MITRAL VALVE               TRICUSPID VALVE MV Area (PHT): 3.60 cm    TR Peak grad:   39.4 mmHg MV Decel Time: 211 msec    TR Vmax:        314.00 cm/s MV E velocity: 78.60 cm/s MV A velocity: 75.70 cm/s  SHUNTS MV E/A ratio:  1.04        Systemic VTI:  0.28 m                            Systemic Diam: 1.70  cm Fransico Him MD Electronically signed by Fransico Him MD Signature Date/Time: 12/06/2019/12:59:41 PM    Final    VAS US CAROTID  Result Date: 12/06/2019 Carotid Arterial Duplex Study Indications:       CVA and Altered mental status. Risk Factors:      Hypertension, hyperlipidemia, coronary artery disease. Other Factors:     Obstructive sleep apnea. Limitations        Today's exam was limited due to  pateint talking and coughing. Comparison Study:  No prior study on file Performing Technologist: Sharion Dove RVS  Examination Guidelines: A complete evaluation includes B-mode imaging, spectral Doppler, color Doppler, and power Doppler as needed of all accessible portions of each vessel. Bilateral testing is considered an integral part of a complete examination. Limited examinations for reoccurring indications may be performed as noted.  Right Carotid Findings: +----------+--------+--------+--------+------------------+------------------+           PSV cm/sEDV cm/sStenosisPlaque DescriptionComments           +----------+--------+--------+--------+------------------+------------------+ CCA Prox  90      20                                intimal thickening +----------+--------+--------+--------+------------------+------------------+ CCA Distal79      14                                intimal thickening +----------+--------+--------+--------+------------------+------------------+ ICA Prox  51      9                                                    +----------+--------+--------+--------+------------------+------------------+ ICA Distal67      19                                                   +----------+--------+--------+--------+------------------+------------------+ ECA       91      12                                                   +----------+--------+--------+--------+------------------+------------------+ +----------+--------+-------+--------+-------------------+           PSV cm/sEDV cmsDescribeArm Pressure (mmHG) +----------+--------+-------+--------+-------------------+ [id number]                                        +----------+--------+-------+--------+-------------------+ +---------+--------+--+--------+-+ VertebralPSV cm/s96EDV cm/s4 +---------+--------+--+--------+-+  Left Carotid Findings:  +----------+--------+--------+--------+------------------+------------------+           PSV cm/sEDV cm/sStenosisPlaque DescriptionComments           +----------+--------+--------+--------+------------------+------------------+ CCA Prox  89      9                                 intimal thickening +----------+--------+--------+--------+------------------+------------------+ CCA Distal78      17  intimal thickening +----------+--------+--------+--------+------------------+------------------+ ICA Prox  63      21                                                   +----------+--------+--------+--------+------------------+------------------+ ICA Distal90      22                                                   +----------+--------+--------+--------+------------------+------------------+ ECA       76      12                                                   +----------+--------+--------+--------+------------------+------------------+ +----------+--------+--------+--------+-------------------+           PSV cm/sEDV cm/sDescribeArm Pressure (mmHG) +----------+--------+--------+--------+-------------------+ [id number]                                         +----------+--------+--------+--------+-------------------+ +---------+--------+--+--------+-+ VertebralPSV cm/s22EDV cm/s5 +---------+--------+--+--------+-+   Summary: Right Carotid: The extracranial vessels were near-normal with only minimal wall                thickening or plaque. Left Carotid: The extracranial vessels were near-normal with only minimal wall               thickening or plaque. Vertebrals:  Bilateral vertebral arteries demonstrate antegrade flow. Subclavians: Normal flow hemodynamics were seen in bilateral subclavian              arteries. *See table(s) above for measurements and observations.  Electronically signed by Antony Contras MD on 12/06/2019 at 11:44:41  AM.    Final    Assessment:  ASSESSMENT AND PLAN   69 year old female with past medical history significant for bipolar disorder with recurrent falls likely due to lower extremity venous stasis, significant stroke risk factors presents with altered mental status and AKI.  Patient slightly confused, but engages conversation and gives a vague recollection of the events of the last 1 month.  MRI brain shows small right frontal white matter infarct-this is not causing her altered mental status.  Recommended EEG which showed no epileptiform discharges, suggestive of mild encephalopathy.  Likely has metabolic encephalopathy in setting of AKI.  Hallucinations may also be from bipolar disorder, continues to persist recommend psychiatric consult.  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   Left Carotid: The extracranial vessels were near-normal with only minimal wall thickening or plaque.   Vertebrals: Bilateral vertebral arteries demonstrate antegrade flow.  Subclavians: Normal flow hemodynamics were seen in bilateral subclavian arteries.  ECHO: EF: 65-70% Acute encephalopathy in the setting of AKI/uremia Other considerations include polypharmacy, psychiatric  Recommendations - continue to minimize sedating medications - if hallucinations persist, consult psych --continue to correct metabolic derangements as you are -  continue ASA 81 mg daily -BP goal is 140/90; avoid hypotension -Neurology to sign off at this time. Stroke-work-up is completed. Please call with any further concerns.  Laurey Morale, MSN, NP-C [facility] [phone]      12/07/2019, 10:04 AM

## 2019-12-08 DIAGNOSIS — I63511 Cerebral infarction due to unspecified occlusion or stenosis of right middle cerebral artery: Secondary | ICD-10-CM | POA: Diagnosis not present

## 2019-12-08 DIAGNOSIS — I872 Venous insufficiency (chronic) (peripheral): Secondary | ICD-10-CM | POA: Diagnosis not present

## 2019-12-08 DIAGNOSIS — N179 Acute kidney failure, unspecified: Secondary | ICD-10-CM | POA: Diagnosis not present

## 2019-12-08 DIAGNOSIS — R4182 Altered mental status, unspecified: Secondary | ICD-10-CM | POA: Diagnosis not present

## 2019-12-08 LAB — CBC
HCT: 33.1 % — ABNORMAL LOW (ref 36.0–46.0)
Hemoglobin: 10.8 g/dL — ABNORMAL LOW (ref 12.0–15.0)
MCH: 28.3 pg (ref 26.0–34.0)
MCHC: 32.6 g/dL (ref 30.0–36.0)
MCV: 86.6 fL (ref 80.0–100.0)
Platelets: 214 10*3/uL (ref 150–400)
RBC: 3.82 MIL/uL — ABNORMAL LOW (ref 3.87–5.11)
RDW: 13.7 % (ref 11.5–15.5)
WBC: 5.7 10*3/uL (ref 4.0–10.5)
nRBC: 0 % (ref 0.0–0.2)

## 2019-12-08 LAB — RENAL FUNCTION PANEL
Albumin: 2.6 g/dL — ABNORMAL LOW (ref 3.5–5.0)
Anion gap: 10 (ref 5–15)
BUN: 13 mg/dL (ref 8–23)
CO2: 23 mmol/L (ref 22–32)
Calcium: 8.8 mg/dL — ABNORMAL LOW (ref 8.9–10.3)
Chloride: 112 mmol/L — ABNORMAL HIGH (ref 98–111)
Creatinine, Ser: 1 mg/dL (ref 0.44–1.00)
GFR calc Af Amer: >60 mL/min (ref >60)
GFR calc non Af Amer: 57 mL/min — ABNORMAL LOW (ref >60)
Glucose, Bld: 91 mg/dL (ref 70–99)
Phosphorus: 2.1 mg/dL — ABNORMAL LOW (ref 2.5–4.6)
Potassium: 3.4 mmol/L — ABNORMAL LOW (ref 3.5–5.1)
Sodium: 145 mmol/L (ref 135–145)

## 2019-12-08 LAB — SARS CORONAVIRUS 2 (TAT 6-24 HRS): SARS Coronavirus 2: NEGATIVE

## 2019-12-08 MED ORDER — POTASSIUM CHLORIDE CRYS ER 20 MEQ PO TBCR
40.0000 meq | EXTENDED_RELEASE_TABLET | Freq: Once | ORAL | Status: AC
  Administered 2019-12-08: 40 meq via ORAL
  Filled 2019-12-08: qty 2

## 2019-12-08 MED ORDER — ONDANSETRON 4 MG PO TBDP
4.0000 mg | ORAL_TABLET | Freq: Three times a day (TID) | ORAL | Status: DC | PRN
  Administered 2019-12-08: 4 mg via ORAL
  Filled 2019-12-08: qty 1

## 2019-12-08 NOTE — Progress Notes (Addendum)
Subjective: HD 4 Overnight, no acute events reported.  Patient evaluated at bedside this morning. Pt states that she is feeling okay today. She states that she vomited earlier. She says there was no blood in it. She says it was yellow, orange and white in color. Pt doesn't remember what she had for dinner last night. She says if she had dinner, it would have been soup. She is visibly confused but answering questions appropriately.   Objective:  Vital signs in last 24 hours: Vitals:   12/07/19 0441 12/07/19 1257 12/07/19 2112 12/08/19 0439  BP: (!) 155/76 (!) 160/80 (!) 143/83 (!) 153/73  Pulse: 67 66 68 70  Resp: 16 17 18 18   Temp: 98.2 F (36.8 C) 98.5 F (36.9 C) 98.3 F (36.8 C) 98.3 F (36.8 C)  TempSrc: Oral Oral Oral Oral  SpO2: 100% 100% 100% 100%  Weight:       CBC Latest Ref Rng & Units 12/08/2019 12/07/2019 12/06/2019  WBC 4.0 - 10.5 K/uL 5.7 6.0 5.3  Hemoglobin 12.0 - 15.0 g/dL 10.8(L) 11.3(L) 11.6(L)  Hematocrit 36.0 - 46.0 % 33.1(L) 35.9(L) 36.0  Platelets 150 - 400 K/uL 214 245 244   CMP Latest Ref Rng & Units 12/08/2019 12/07/2019 12/07/2019  Glucose 70 - 99 mg/dL 91 89 83  BUN 8 - 23 mg/dL 13 16 18   Creatinine 0.44 - 1.00 mg/dL 1.00 1.10(H) 1.18(H)  Sodium 135 - 145 mmol/L 145 145 146(H)  Potassium 3.5 - 5.1 mmol/L 3.4(L) 3.3(L) 3.6  Chloride 98 - 111 mmol/L 112(H) 111 109  CO2 22 - 32 mmol/L 23 25 25   Calcium 8.9 - 10.3 mg/dL 8.8(L) 9.1 9.3  Total Protein 6.5 - 8.1 g/dL - - -  Total Bilirubin 0.3 - 1.2 mg/dL - - -  Alkaline Phos 38 - 126 U/L - - -  AST 15 - 41 U/L - - -  ALT 0 - 44 U/L - - -   Constitutional: sitting in bedside chair, NAD HEENT Head: [place], AT Eyes: Conjunctivae normal, EOM nl Cardiovascular: RRR, nl S1 and S2, no murmurs, rubs or gallops noted; RLE chronically larger in diameter than left, no tenderness, pitting edema or erythema in bilateral lower extremities Respiratory: Effort and breath sounds nl. No respiratory distress, no wheezing,  crackles or rhonchi noted GI/Abdomen: Nondistended, soft, normoactive bowel sounds; non-tender Musculoskeletal: spontaneous movement of all extremities, chronic RLE > LLE Neurologic: alert and oriented x3, no focal deficits noted; did have some confusion Skin: non-diaphoretic, no rash or erythema noted   Assessment/Plan: Ms. Texanna Mcclaugherty is a 69 year old female with PMHx of CAD, HTN, HLD, asthma, OSA, obesity, GERD, chronic pain from fibromyalgia, lower extremity venous stasis, bipolar depression with history of recurrent falls presenting with AMS and AKI.   Altered mental status in setting of AKI and polypharmacy: Hospital delirium: Patient presented with altered mental status in setting of AKI due to overdiuresis. Also suspect polypharmacy contributing to her AMS as she was on multiple centrally acting medications. Patient improved with IV fluids and creatinine normalized today. Suspect part of her symptoms may be related to hospital delirium due to waxing and waning nature.  - Continue to hold lasix - Strict I&O  - Daily weights - BMP monitoring  - Avoid centrally acting and/or nephrotoxic agents - TOC consulted for SNF placement, appreciate their assistance   Acute left frontal infarct:  Patient noted to have subcentimeter infarction of left frontal white matter on MRI. 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 Echo with LVEF 60-65% without RWMA. Carotid US without significant stenosis. No focal deficits noted on examination - Aspirin 81mg  daily - Atorvastatin 40mg  daily - PT/OT evaluation: recommending for SNF  Chronic venous insufficiency::  Has chronic venous insufficiency of right lower extremity that was treated with lasix and torsemide. However, concerns for overdiuresis with diuretics. Currently stable  - Compression stockings    Prior to Admission Living Arrangement: SNF Anticipated Discharge Location: SNF Barriers to Discharge: pending SNF placement  Dispo: Anticipated discharge in approximately 1-2 day(s).   Harvie Heck, MD  Internal Medicine, PGY-1 12/08/2019, 7:06 AM Pager: [phone]

## 2019-12-08 NOTE — NC FL2 (Signed)
[place] MEDICAID FL2 LEVEL OF CARE SCREENING TOOL     IDENTIFICATION  Patient Name: Alisha [name] Birthdate: 04-27-1951 Sex: female Admission Date (Current Location): 12/04/2019  [facility] and [place] Number:  Herbalist and Address:  [facility] [place], [place] [address] [place], [place] [address]      Provider Number: [id number]  Attending Physician Name and Address:  Velna Ochs, MD  Relative Name and Phone Number:       Current Level of Care: Hospital Recommended Level of Care: [place] Prior Approval Number:    Date Approved/Denied:   PASRR Number: [id number]  Discharge Plan: SNF    Current Diagnoses: Patient Active Problem List   Diagnosis Date Noted  . Cerebrovascular accident (CVA) ([place])   . Altered mental status   . Fall   . AKI (acute kidney injury) ([place]) 11/03/2019  . Recurrent falls 09/22/2018  . Chronic use of opiate drug for therapeutic purpose 04/11/2018  . Bradycardia 04/11/2018  . Chronic fatigue 04/11/2018  . Prediabetes 12/17/2017  . Mild cognitive impairment 12/13/2017  . Cough variant asthma vs UACS 10/26/2017  . Upper airway cough syndrome 10/25/2017  . Medial epicondylitis, right 09/16/2017  . Right shoulder pain 09/07/2017  . Lateral epicondylitis of right elbow 09/07/2017  . Vitamin D deficiency 07/13/2017  . History of foot fracture 07/12/2017  . History of colon polyps 07/12/2017  . Cataract of both eyes 07/11/2017  . Hypertensive retinopathy of both eyes 07/11/2017  . Fibromyalgia 05/17/2017  . Tenosynovitis, wrist 05/14/2017  . Chronic cough 05/02/2017  . Foot pain, left 05/02/2017  . Dysphagia 03/23/2017  . Health care maintenance 03/23/2017  . Coronary artery disease involving native coronary artery of native heart without angina pectoris 01/11/2017  . Hyperlipidemia 01/11/2017  . Polypharmacy 11/25/2016  . Venous stasis of both lower extremities 10/04/2016  . Poor  dentition 10/04/2016  . OSA (obstructive sleep apnea) 10/04/2016  . History of nephrolithiasis 10/04/2016  . Hx of pilonidal cyst 10/04/2016  . Bipolar depression ([place]) 10/04/2016  . Personal history of DVT (deep vein thrombosis) 10/04/2016  . Morbid obesity due to excess calories ([place]) 09/25/2016  . History of pituitary adenoma s/p resection 09/25/2016  . Essential hypertension 09/25/2016  . Psoriatic arthritis ([place]) 09/25/2016  . Vision impairment 09/25/2016  . Chronic seasonal allergic rhinitis due to pollen 09/25/2016  . Moderate persistent asthma without complication [id number]  . Gastroesophageal reflux disease without esophagitis 09/25/2016  . Urge incontinence 09/25/2016  . Chronic pain syndrome 09/25/2016  . Candidal intertrigo 05/10/2010    Orientation RESPIRATION BLADDER Height & Weight     Self, Time, Situation, Place  Normal, O2(intermittant nasal canula) Incontinent, Indwelling catheter(foley for retention) Weight: 268 lb 15.4 oz (122 kg) Height:     BEHAVIORAL SYMPTOMS/MOOD NEUROLOGICAL BOWEL NUTRITION STATUS      Continent Diet(heart health/carb modified; thin liquids)  AMBULATORY STATUS COMMUNICATION OF NEEDS Skin   Extensive Assist Verbally Normal(WDL)                       Personal Care Assistance Level of Assistance  Dressing, Feeding, Bathing Bathing Assistance: Maximum assistance Feeding assistance: Limited assistance Dressing Assistance: Maximum assistance     Functional Limitations Info  Sight, Hearing, Speech Sight Info: Adequate Hearing Info: Adequate Speech Info: Adequate    SPECIAL CARE FACTORS FREQUENCY  OT (By licensed OT), PT (By licensed PT)     PT Frequency: 5x week OT Frequency: 5x week  Contractures Contractures Info: Not present    Additional Factors Info  Code Status, Allergies Code Status Info: Full Code Allergies Info: Ibuprofen, Ampicillin, Asa (Aspirin), Darvon (Propoxyphene), Demeclocycline, Eggs Or  Egg-derived Products, Lactalbumin, Lisinopril, Milk-related Compounds, Salicylates, Tetracycline Hcl, Tetracyclines & Related           Current Medications (12/08/2019):  This is the current hospital active medication list Current Facility-Administered Medications  Medication Dose Route Frequency Provider Last Rate Last Admin  . acetaminophen (TYLENOL) tablet 650 mg  650 mg Oral Q6H PRN Al Decant, MD   650 mg at 12/08/19 0759  . albuterol (PROVENTIL) (2.5 MG/3ML) 0.083% nebulizer solution 2.5 mg  2.5 mg Inhalation Q6H PRN Jean Rosenthal, MD   2.5 mg at 12/08/19 0815  . aspirin EC tablet 81 mg  81 mg Oral Daily Agyei, Obed K, MD   81 mg at 12/08/19 1100  . atorvastatin (LIPITOR) tablet 80 mg  80 mg Oral QHS Agyei, Obed K, MD   80 mg at 12/07/19 2226  . Chlorhexidine Gluconate Cloth 2 % PADS 6 each  6 each Topical Daily Velna Ochs, MD   6 each at 12/07/19 1020  . gabapentin (NEURONTIN) capsule 300 mg  300 mg Oral TID Harvie Heck, MD   300 mg at 12/08/19 1100  . heparin injection 5,000 Units  5,000 Units Subcutaneous Q8H Jean Rosenthal, MD   5,000 Units at 12/08/19 0454  . lactated ringers infusion   Intravenous Continuous Aslam, Sadia, MD 100 mL/hr at 12/08/19 0600 Rate Verify at 12/08/19 0600  . loratadine (CLARITIN) tablet 10 mg  10 mg Oral Daily Agyei, Obed K, MD   10 mg at 12/08/19 1100  . ondansetron (ZOFRAN-ODT) disintegrating tablet 4 mg  4 mg Oral Q8H PRN Harvie Heck, MD   4 mg at 12/08/19 1006  . pantoprazole (PROTONIX) EC tablet 40 mg  40 mg Oral BID Agyei, Obed K, MD   40 mg at 12/08/19 1100  . polyethylene glycol (MIRALAX / GLYCOLAX) packet 17 g  17 g Oral Daily Al Decant, MD   17 g at 12/08/19 1100  . sodium chloride flush (NS) 0.9 % injection 10-40 mL  10-40 mL Intracatheter Q12H Velna Ochs, MD   10 mL at 12/06/19 1150  . sodium chloride flush (NS) 0.9 % injection 10-40 mL  10-40 mL Intracatheter PRN Velna Ochs, MD      . umeclidinium bromide (INCRUSE  ELLIPTA) 62.5 MCG/INH 2 puff  2 puff Inhalation Daily Jean Rosenthal, MD   2 puff at 12/05/19 [id number]     Discharge Medications: Please see discharge summary for a list of discharge medications.  Relevant Imaging Results:  Relevant Lab Results:   Additional Information SSN: [id number]  Alexander Mt, LCSW

## 2019-12-08 NOTE — TOC Initial Note (Signed)
Transition of Care [facility]) - Initial/Assessment Note    Patient Details  Name: Alisha [name] MRN: [id number] Date of Birth: 1951-06-12  Transition [facility]) CM/SW Contact:    Alexander Mt, LCSW Phone Number: 12/08/2019, 10:32 AM  Clinical Narrative:                 CSW spoke with pt at bedside. Introduced self, role, reason for visit. Pt amenable to speaking with CSW, but requested that I call her daughter [name], CSW called Valtracy and left a message.    Valtracy returned this writer's call [phone]). CSW introduced self, Pt was at home in an apartment prior to previous admission last month. She then has gone to [facility] where she has been getting therapy. Pt has been eating less and had a few falls, pt family has expressed their concerns to facility staff. CSW and pt daughter discussed that the family realizes that pt may need more LTC placement moving forward but they want her to get the most out of therapy. Valtracy expresses that if what is being recommended is SNF that they are okay with pt returning to [facility] but do not want her there for LTC placement. We discussed that referral would need to go through [facility] again, and in the mean time CSW would send Valtracy information to her email ([email or web address]) about LTC placement options.   Pt daughter also mentioning that pt requiring intermittant oxygen and she would like for this to again be documented and communicated to SNF. CSW will ask RN if we can get o2 saturation note.   Expected Discharge Plan: Skilled Nursing Facility Barriers to Discharge: Continued Medical Work up, Ship broker   Patient Goals and CMS Choice Patient states their goals for this hospitalization and ongoing recovery are:: continue rehab CMS Medicare.gov Compare Post Acute Care list provided to:: Patient Choice offered to / list presented to : Patient, Adult Children  Expected Discharge Plan and  Services Expected Discharge Plan: Skilled Nursing Facility In-house Referral: Clinical Social Work Discharge Planning Services: CM Consult Post Acute Care Choice: Durable Medical Equipment Living arrangements for the past 2 months: Single Family Home, [place]  Prior Living Arrangements/Services Living arrangements for the past 2 months: [place], [place] Lives with:: Self Patient language and need for interpreter reviewed:: Yes(no needs) Do you feel safe going back to the place where you live?: Yes      Need for Family Participation in Patient Care: Yes (Comment) Care giver support system in place?: Yes (comment) Current home services: DME Criminal Activity/Legal Involvement Pertinent to Current Situation/Hospitalization: No - Comment as needed  Activities of Daily Living      Permission Sought/Granted Permission sought to share information with : Family Supports Permission granted to share information with : Yes, Verbal Permission Granted  Share Information with NAME: Rolene Arbour  Permission granted to share info w AGENCY: SNFs  Permission granted to share info w Relationship: [phone]  Permission granted to share info w Contact Information: daughter  Emotional Assessment Appearance:: Appears older than stated age Attitude/Demeanor/Rapport: Engaged Affect (typically observed): Pleasant Orientation: : Oriented to Self, Oriented to Situation, Oriented to  Time, Oriented to Place, Fluctuating Orientation (Suspected and/or reported Sundowners) Alcohol / Substance Use: Not Applicable Psych Involvement: (n/a)  Admission diagnosis:  AKI (acute kidney injury) ([place]) [N17.9] Acute kidney injury ([place]) [N17.9] Patient Active Problem List   Diagnosis Date Noted  . Cerebrovascular accident (CVA) ([place])   . Altered  mental status   . Fall   . AKI (acute kidney injury) ([place]) 11/03/2019  . Recurrent falls 09/22/2018  . Chronic use of  opiate drug for therapeutic purpose 04/11/2018  . Bradycardia 04/11/2018  . Chronic fatigue 04/11/2018  . Prediabetes 12/17/2017  . Mild cognitive impairment 12/13/2017  . Cough variant asthma vs UACS 10/26/2017  . Upper airway cough syndrome 10/25/2017  . Medial epicondylitis, right 09/16/2017  . Right shoulder pain 09/07/2017  . Lateral epicondylitis of right elbow 09/07/2017  . Vitamin D deficiency 07/13/2017  . History of foot fracture 07/12/2017  . History of colon polyps 07/12/2017  . Cataract of both eyes 07/11/2017  . Hypertensive retinopathy of both eyes 07/11/2017  . Fibromyalgia 05/17/2017  . Tenosynovitis, wrist 05/14/2017  . Chronic cough 05/02/2017  . Foot pain, left 05/02/2017  . Dysphagia 03/23/2017  . Health care maintenance 03/23/2017  . Coronary artery disease involving native coronary artery of native heart without angina pectoris 01/11/2017  . Hyperlipidemia 01/11/2017  . Polypharmacy 11/25/2016  . Venous stasis of both lower extremities 10/04/2016  . Poor dentition 10/04/2016  . OSA (obstructive sleep apnea) 10/04/2016  . History of nephrolithiasis 10/04/2016  . Hx of pilonidal cyst 10/04/2016  . Bipolar depression ([place]) 10/04/2016  . Personal history of DVT (deep vein thrombosis) 10/04/2016  . Morbid obesity due to excess calories ([place]) 09/25/2016  . History of pituitary adenoma s/p resection 09/25/2016  . Essential hypertension 09/25/2016  . Psoriatic arthritis ([place]) 09/25/2016  . Vision impairment 09/25/2016  . Chronic seasonal allergic rhinitis due to pollen 09/25/2016  . Moderate persistent asthma without complication [id number]  . Gastroesophageal reflux disease without esophagitis 09/25/2016  . Urge incontinence 09/25/2016  . Chronic pain syndrome 09/25/2016  . Candidal intertrigo 05/10/2010   PCP:  Sonia Side., FNP Pharmacy:   [id number] - [address] [place]  [address] Phone: [phone] Fax: [phone]   Readmission Risk Interventions Readmission Risk Prevention Plan 12/08/2019  Transportation Screening Complete  PCP or Specialist Appt within 5-7 Days Not Complete  Not Complete comments plan for SNF  Home Care Screening Complete  Medication Review (RN CM) Referral to Pharmacy  Some recent data might be hidden

## 2019-12-08 NOTE — Plan of Care (Signed)

## 2019-12-09 DIAGNOSIS — I872 Venous insufficiency (chronic) (peripheral): Secondary | ICD-10-CM | POA: Diagnosis not present

## 2019-12-09 DIAGNOSIS — I63511 Cerebral infarction due to unspecified occlusion or stenosis of right middle cerebral artery: Secondary | ICD-10-CM | POA: Diagnosis not present

## 2019-12-09 DIAGNOSIS — R4182 Altered mental status, unspecified: Secondary | ICD-10-CM | POA: Diagnosis not present

## 2019-12-09 DIAGNOSIS — N179 Acute kidney failure, unspecified: Secondary | ICD-10-CM | POA: Diagnosis not present

## 2019-12-09 LAB — RENAL FUNCTION PANEL
Albumin: 2.6 g/dL — ABNORMAL LOW (ref 3.5–5.0)
Anion gap: 10 (ref 5–15)
BUN: 9 mg/dL (ref 8–23)
CO2: 21 mmol/L — ABNORMAL LOW (ref 22–32)
Calcium: 8.9 mg/dL (ref 8.9–10.3)
Chloride: 112 mmol/L — ABNORMAL HIGH (ref 98–111)
Creatinine, Ser: 1.01 mg/dL — ABNORMAL HIGH (ref 0.44–1.00)
GFR calc Af Amer: >60 mL/min (ref >60)
GFR calc non Af Amer: 56 mL/min — ABNORMAL LOW (ref >60)
Glucose, Bld: 76 mg/dL (ref 70–99)
Phosphorus: 2 mg/dL — ABNORMAL LOW (ref 2.5–4.6)
Potassium: 3.4 mmol/L — ABNORMAL LOW (ref 3.5–5.1)
Sodium: 143 mmol/L (ref 135–145)

## 2019-12-09 LAB — CBC
HCT: 33.7 % — ABNORMAL LOW (ref 36.0–46.0)
Hemoglobin: 10.8 g/dL — ABNORMAL LOW (ref 12.0–15.0)
MCH: 28.3 pg (ref 26.0–34.0)
MCHC: 32 g/dL (ref 30.0–36.0)
MCV: 88.2 fL (ref 80.0–100.0)
Platelets: 206 10*3/uL (ref 150–400)
RBC: 3.82 MIL/uL — ABNORMAL LOW (ref 3.87–5.11)
RDW: 13.9 % (ref 11.5–15.5)
WBC: 6.7 10*3/uL (ref 4.0–10.5)
nRBC: 0 % (ref 0.0–0.2)

## 2019-12-09 MED ORDER — POTASSIUM CHLORIDE CRYS ER 20 MEQ PO TBCR
20.0000 meq | EXTENDED_RELEASE_TABLET | Freq: Once | ORAL | Status: AC
  Administered 2019-12-09: 20 meq via ORAL
  Filled 2019-12-09: qty 1

## 2019-12-09 NOTE — Progress Notes (Signed)
Physical Therapy Treatment Patient Details Name: Alisha [name] MRN: [id number] DOB: [birth year] Today's Date: 12/09/2019    History of Present Illness Alisha [name] is a 69 y.o. 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 Work up in ED revealed AKI. MRI with small left frontal white matter infarction.    PT Comments    Pt very lethargic initially but once fully awake made good progress with mobility and was very motivated. Performed sit<>stand multiple times from bed and recliner. SPT to recliner with use of RW and mod A+2. Pt having difficulty stepping feet in standing, R>L. Worked on pregait activities in standing. PT will continue to follow.    Follow Up Recommendations  SNF;Supervision/Assistance - 24 hour     Equipment Recommendations  None recommended by PT    Recommendations for Other Services       Precautions / Restrictions Precautions Precautions: Fall Restrictions Weight Bearing Restrictions: No    Mobility  Bed Mobility Overal bed mobility: Needs Assistance Bed Mobility: Supine to Sit     Supine to sit: Min assist     General bed mobility comments: bed placed in chair position and pt worked on core activation with sitting fwd. After 5 reps pt more awake and able to slide LE's off EOB and scoot to sitting EOB with min A  Transfers Overall transfer level: Needs assistance Equipment used: Rolling walker (2 wheeled) Transfers: Sit to/from [facility] Sit to Stand: +2 physical assistance;Mod assist Stand pivot transfers: Mod assist;+2 physical assistance       General transfer comment: mod A +2 for  power up from bed. Pivoted to chair with mod A +2 and RW and then practiced numerous sit<>stand from recliner. Used back of straight back chair for pt to hold once standing.   Ambulation/Gait             General Gait Details: difficulty stepping feet. Performed pregait activties in standing including wt shifting and low stepping.    Stairs             Wheelchair Mobility    Modified Rankin (Stroke Patients Only) Modified Rankin (Stroke Patients Only) Pre-Morbid Rankin Score: No symptoms Modified Rankin: Moderately severe disability     Balance Overall balance assessment: Needs assistance Sitting-balance support: Feet supported Sitting balance-Leahy Scale: Fair     Standing balance support: Bilateral upper extremity supported Standing balance-Leahy Scale: Poor Standing balance comment: mod A +2 and RW to stand                            Cognition Arousal/Alertness: Awake/alert;Lethargic Behavior During Therapy: Flat affect Overall Cognitive Status: Impaired/Different from baseline Area of Impairment: Memory;Safety/judgement;Awareness;Problem solving;Attention;Following commands;Orientation                 Orientation Level: Time Current Attention Level: Sustained Memory: Decreased short-term memory Following Commands: Follows one step commands consistently;Follows one step commands with increased time Safety/Judgement: Decreased awareness of safety;Decreased awareness of deficits Awareness: Emergent Problem Solving: Difficulty sequencing;Requires verbal cues;Requires tactile cues;Slow processing General Comments: pt agreeing today that she could not go home safely in current state but still do not think that she fully comprehends deficits      Exercises General Exercises -  Lower Extremity Ankle Circles/Pumps: AROM;Both;10 reps;Supine Long Arc Quad: AROM;Both;10 reps;Seated    General Comments General comments (skin integrity, edema,  etc.): once pt fully awake she was very motivated to mobilize      Pertinent Vitals/Pain Pain Assessment: No/denies pain    Home Living                      Prior Function            PT Goals (current goals can now be found in the care plan section) Acute Rehab PT Goals Patient Stated Goal: return home PT Goal Formulation: With patient Time For Goal Achievement: 12/20/19 Potential to Achieve Goals: Fair Progress towards PT goals: Progressing toward goals    Frequency    Min 3X/week      PT Plan Current plan remains appropriate    Co-evaluation              AM-PAC PT "6 Clicks" Mobility   Outcome Measure  Help needed turning from your back to your side while in a flat bed without using bedrails?: A Little Help needed moving from lying on your back to sitting on the side of a flat bed without using bedrails?: A Little Help needed moving to and from a bed to a chair (including a wheelchair)?: A Lot Help needed standing up from a chair using your arms (e.g., wheelchair or bedside chair)?: A Lot Help needed to walk in hospital room?: Total Help needed climbing 3-5 steps with a railing? : Total 6 Click Score: 12    End of Session Equipment Utilized During Treatment: Gait belt Activity Tolerance: Patient tolerated treatment well Patient left: with call bell/phone within reach;in chair;with chair alarm set Nurse Communication: Mobility status PT Visit Diagnosis: Other abnormalities of gait and mobility (R26.89);Muscle weakness (generalized) (M62.81)     Time: [id number] PT Time Calculation (min) (ACUTE ONLY): 29 min  Charges:  $Gait Training: 8-22 mins $Therapeutic Activity: 8-22 mins                     Leighton Roach, [place]  Pager [phone] Office [place] 12/09/2019, 12:27 PM

## 2019-12-09 NOTE — TOC Progression Note (Addendum)
Transition of Care [facility]) - Progression Note    Patient Details  Name: Alisha [name] MRN: [id number] Date of Birth: 01/23/1951  Transition of Care [facility]) CM/SW [place], [place] Phone Number: 12/09/2019, 3:35 PM  Clinical Narrative:    CSW received authorization from [facility] Medicare. Josem Kaufmann [id number], approved 4/13-4/15, updates to be sent to [facility] by SNF at [phone].   CSW spoke with pt daughter [name], provided her with this update and that pt medically cleared by MD team. CSW also provided her via email ([email or web address]) with "Assisted Living Locators" and "[facility]". Pt daughter aware that paperwork needs to be completed for facility, provided Valtracy's number to [facility] in admissions at [facility]. Pt daughter has shared with Narda Rutherford that she needs time to review and complete paperwork for SNF. When paperwork complete will transfer pt, at this time of day it will be first thing 4/14 as long as pt remains medically stable.   Dr. Marva Panda aware.   Expected Discharge Plan: [place] Barriers to Discharge: Continued Medical Work up, Ship broker  Expected Discharge Plan and Services Expected Discharge Plan: [place] In-house Referral: Clinical Social Work Discharge Planning Services: CM Consult Post Acute Care Choice: Durable Medical Equipment Living arrangements for the past 2 months: [place], [place]    Readmission Risk Interventions Readmission Risk Prevention Plan 12/08/2019  Transportation Screening Complete  PCP or Specialist Appt within 5-7 Days Not Complete  Not Complete comments plan for SNF  Home Care Screening Complete  Medication Review (RN CM) Referral to Pharmacy  Some recent data might be hidden

## 2019-12-09 NOTE — Plan of Care (Signed)

## 2019-12-09 NOTE — Discharge Summary (Addendum)
Name: Alisha [name] MRN: [id number] DOB: [birth year] 69 y.o. PCP: Sonia Side., FNP  Date of Admission: 12/04/2019  3:50 PM Date of Discharge: 12/10/2019 Attending Physician: Velna Ochs, MD  Discharge Diagnosis: 1. Altered mental status  2. AKI 3. Acute left frontal infarct 4. Chronic venous insufficiency  5. Urinary retention  Discharge Medications: Allergies as of 12/10/2019      Reactions   Ibuprofen    Other reaction(s): Upset Stomach   Ampicillin Nausea Only   Asa [aspirin] Diarrhea   Other reaction(s): Upset Stomach   Darvon [propoxyphene] Nausea And Vomiting   Demeclocycline Other (See Comments)   Nerves feeling   Eggs Or Egg-derived Products Diarrhea   Lactalbumin Diarrhea   Lisinopril Cough   Milk-related Compounds Diarrhea   Salicylates Other (See Comments)   Unknown Unknown   Tetracycline Hcl Hives, Nausea Only   Tetracyclines & Related Other (See Comments)   Nerves feeling      Medication List    STOP taking these medications   diclofenac 50 MG tablet Commonly known as: CATAFLAM   flurbiprofen 100 MG tablet Commonly known as: ANSAID   flurbiprofen 50 MG tablet Commonly known as: ANSAID   furosemide 40 MG tablet Commonly known as: LASIX   predniSONE 20 MG tablet Commonly known as: DELTASONE   torsemide 20 MG tablet Commonly known as: DEMADEX     TAKE these medications   Ventolin HFA 108 (90 Base) MCG/ACT inhaler Generic drug: albuterol Inhale 1 puff into the lungs every 6 (six) hours as needed for wheezing or shortness of breath.   albuterol (2.5 MG/3ML) 0.083% nebulizer solution Commonly known as: PROVENTIL Take 3 mLs (2.5 mg total) by nebulization every 6 (six) hours as needed for wheezing or shortness of breath.   amitriptyline 50 MG tablet Commonly known as: ELAVIL Take 1 tablet (50 mg total) by mouth at bedtime.   aspirin EC 81 MG tablet Take 81 mg by mouth daily.   atorvastatin 80 MG tablet Commonly known as:  LIPITOR Take 80 mg by mouth at bedtime.   budesonide-formoterol 80-4.5 MCG/ACT inhaler Commonly known as: Symbicort Inhale 2 puffs into the lungs 2 (two) times daily.   clopidogrel 75 MG tablet Commonly known as: PLAVIX Take 1 tablet (75 mg total) by mouth daily.   cyclobenzaprine 5 MG tablet Commonly known as: FLEXERIL Take 1 tablet (5 mg total) by mouth 3 (three) times daily as needed for muscle spasms.   DULoxetine 60 MG capsule Commonly known as: Cymbalta Take 1 capsule (60 mg total) by mouth daily.   gabapentin 300 MG capsule Commonly known as: NEURONTIN Take 300 mg by mouth 3 (three) times daily.   loratadine 10 MG tablet Commonly known as: CLARITIN Take 10 mg by mouth daily.   losartan 100 MG tablet Commonly known as: COZAAR Take 1 tablet (100 mg total) daily by mouth.   metFORMIN 500 MG tablet Commonly known as: GLUCOPHAGE Take 500 mg by mouth 2 (two) times daily with a meal.   Myrbetriq 50 MG Tb24 tablet Generic drug: mirabegron ER Take 50 mg by mouth daily.   oxybutynin 15 MG 24 hr tablet Commonly known as: DITROPAN XL Take 15 mg by mouth daily.   OXYGEN Inhale 2 L into the lungs continuous. Via nasal cannula to help keep sats >90%   pantoprazole 40 MG tablet Commonly known as: PROTONIX Take 40 mg by mouth 2 (two) times daily.   Spiriva Respimat 1.25 MCG/ACT Aers Generic drug: Tiotropium Bromide  Monohydrate Inhale 2 puffs into the lungs daily.   tiZANidine 4 MG tablet Commonly known as: ZANAFLEX Take 1.5 tablets (6 mg total) by mouth daily as needed for muscle spasms.   topiramate 50 MG tablet Commonly known as: TOPAMAX Take 50 mg by mouth at bedtime.       Disposition and follow-up:   Ms.Demiyah Kopka was discharged from [facility] in Stable condition.  At the hospital follow up visit please address:  1.  AMS in setting of AKI: Please make sure patient is not taking lasix or torsemide. F/u BMP.   Acute left frontal  infarct: Noted to have subcentimeter left frontal infarct on MRI brain. Stroke work up complete; Please make sure patient is taking aspirin and atorvastatin on discharge. Recommend PT/OT/SLP follow up at [facility].   Chronic venous insufficiency: Recommend compression stockings. Consider adjusting medications for sciatic pain. Continue with PT/OT   Urinary retention: Spontaneously resolved. Recommend discontinuation of oxybutynin.   2.  Labs / imaging needed at time of follow-up: BMP  3.  Pending labs/ test needing follow-up: None  Follow-up Appointments: Contact information for after-discharge care    Destination    [facility] .   Service: Skilled Nursing Contact information: [place] Hospital Course by problem list: 1. Altered mental status in setting of AKI:  Patient presented with altered mental status in setting of AKI due to overdiuresis. Also suspect polypharmacy contributing to her AMS as she was on multiple centrally acting medications. Patient improved with IV fluids and creatinine normalized. Recommend to discontinue lasix and torsemide on discharge.   2. Acute left frontal infarct:  Due to concerns for stroke MRI obtained which showed a acute subcentimeter infarction affecting the left frontal white matter.  Stroke work-up initiated. 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  On echo, normal left ventricular ejection fraction.  No regional wall motion abnormalities noted.  Carotid ultrasound without significant stenosis. Patient does continue to have some difficulty with words but is significantly improved from prior. Patient to continue taking aspirin and atorvastatin  on discharge.   3. Chronic venous insufficiency:  Has chronic venous insufficiency of right lower extremity that was treated with lasix and torsemide. However, concerns for overdiuresis with diuretics. Currently stable. Recommend treatment with compression stockings.   4. Urinary retention Patient initially noted to have >500cc of urine in bladder on presentation for which foley placed. Patient's home oxybutinin held during admission. On 4/14, foley removed and concerns for patient's inability to void for which she was observed overnight. Given 500cc fluid bolus with spontaneous voiding after. Patient is stable for discharge. Recommend discontinuation of oxybutinin on discharge.   Discharge Vitals:   BP (!) 158/87 (BP Location: Right Arm)   Pulse 79   Temp 98.8 F (37.1 C) (Oral)   Resp 18   Wt 122 kg   SpO2 100%   BMI 43.41 kg/m   Pertinent Labs, Studies, and Procedures:  CBC Latest Ref Rng & Units 12/10/2019 12/09/2019 12/08/2019  WBC 4.0 - 10.5 K/uL 7.7 6.7 5.7  Hemoglobin 12.0 - 15.0 g/dL 11.6(L) 10.8(L) 10.8(L)  Hematocrit 36.0 - 46.0 % 36.0 33.7(L) 33.1(L)  Platelets 150 - 400 K/uL 217 206 214   CMP Latest Ref Rng & Units 12/10/2019 12/09/2019 12/08/2019  Glucose 70 - 99 mg/dL 76 76 91  BUN 8 - 23 mg/dL 6(L) 9 13  Creatinine 0.44 - 1.00 mg/dL 1.02(H) 1.01(H) 1.00  Sodium 135 - 145 mmol/L 144 143 145  Potassium 3.5 - 5.1 mmol/L 3.5 3.4(L) 3.4(L)  Chloride 98 - 111 mmol/L 115(H) 112(H) 112(H)  CO2 22 - 32 mmol/L 22 21(L) 23  Calcium 8.9 - 10.3 mg/dL 8.9 8.9 8.8(L)  Total Protein 6.5 - 8.1 g/dL - - -  Total Bilirubin 0.3 - 1.2 mg/dL - - -  Alkaline Phos 38 - 126 U/L - - -  AST 15 - 41 U/L - - -  ALT 0 - 44 U/L - - -   CXR 12/04/2019: IMPRESSION: No active cardiopulmonary disease.  RENAL US 12/04/2019: IMPRESSION: 1. No hydronephrosis or shadowing stone. 2. Right renal inferior pole cyst. 3. Fatty liver.  MR BRAIN WO CONTRAST 12/05/2019: IMPRESSION: Acute subcentimeter  infarction affecting the left frontal white matter adjacent to the frontal horn of the lateral ventricle. Previous craniotomy for pituitary tumor resection. Atrophy, encephalomalacia and gliosis at the base of the brain on the right presumably subsequent to that. Small amount of residual pituitary tumor in the left cavernous sinus, better shown on previous detailed contrast enhanced exams. Extensive chronic small-vessel ischemic changes elsewhere affecting the brainstem, cerebellum, thalami, basal ganglia and hemispheric white matter.  MR ANGIO HEAD WO CONTRAST 12/05/2019: IMPRESSION: 1. Negative for large vessel occlusion. 2. 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 3. 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 Given the small size surveillance MRA (annual or biennial) may be the best management strategy. Alternatively Neurosurgery and/or Neuro-Interventional Radiology consultation could be pursued to evaluate the appropriateness of potential treatment. 4. Posterior circulation is diminutive on the basis of fetal type PCA origins, with no definite hemodynamically significant stenosis.  BILATERAL CAROTID US 12/06/2019: Summary:  Right Carotid: The extracranial vessels were near-normal with only minimal  wall thickening or plaque.  Left Carotid: The extracranial vessels were near-normal with only minimal  wall thickening or plaque.  Vertebrals: Bilateral vertebral arteries demonstrate antegrade flow.  Subclavians: Normal flow hemodynamics were seen in bilateral subclavian  arteries.   ECHO 12/06/2019: IMPRESSIONS  1. Left ventricular ejection fraction, by estimation, is 65 to 70%. The  left ventricle has normal function. The left ventricle has no regional  wall motion abnormalities. There is mild left ventricular hypertrophy of  the basal-septal segment. Left  ventricular  diastolic parameters are indeterminate.  2. Right ventricular systolic function is normal. The right ventricular  size is normal. There is moderately elevated pulmonary artery systolic  pressure. The estimated right ventricular systolic pressure is [id number] mmHg.  3. The mitral valve is normal in structure. Trivial mitral valve  regurgitation. No evidence of mitral stenosis.  4. Tricuspid valve regurgitation is moderate.  5. The aortic valve is normal in structure. Aortic valve regurgitation is  not visualized. No aortic stenosis is present.  6. The inferior vena cava is normal in size with greater than 50%  respiratory variability, suggesting right atrial pressure of 3 mmHg.    Discharge Instructions: Discharge Instructions    Call MD for:  difficulty breathing, headache or visual disturbances   Complete by: As directed    Call MD for:  extreme  fatigue   Complete by: As directed    Call MD for:  hives   Complete by: As directed    Call MD for:  persistant dizziness or light-headedness   Complete by: As directed    Call MD for:  persistant nausea and vomiting   Complete by: As directed    Call MD for:  severe uncontrolled pain   Complete by: As directed    Call MD for:  temperature >100.4   Complete by: As directed    Diet - low sodium heart healthy   Complete by: As directed    Discharge instructions   Complete by: As directed    Ms. Lanya, [place]  You were admitted to the hospital with confusion and a kidney injury from too much diuresis. You improved with fluids. Please continue to ensure that you are eating and drinking properly. On discharge, please discontinue the following medications: Lasix, Torsemide.  For your right leg swelling due to chronic venous insufficiency, we recommend using compression stockings.   Thank you!   Increase activity slowly   Complete by: As directed       Signed: Harvie Heck, MD 12/10/2019, 10:55 AM   Pager: [phone]

## 2019-12-09 NOTE — Progress Notes (Signed)
Subjective: HD 5 Overnight, no acute events reported.  Patient evaluated at bedside this morning. Pt states that she didn't eat breakfast because she didn't want it. We discussed discharging th patient to a rehab facility, and she says that is ok. She complains of R leg pain that is unchanged from prior.   Objective:  Vital signs in last 24 hours: Vitals:   12/07/19 2112 12/08/19 0439 12/08/19 0907 12/08/19 2033  BP: (!) 143/83 (!) 153/73 (!) 147/86 (!) 178/91  Pulse: 68 70 84 68  Resp: 18 18  16   Temp: 98.3 F (36.8 C) 98.3 F (36.8 C) 98.2 F (36.8 C) 98.2 F (36.8 C)  TempSrc: Oral Oral Oral Oral  SpO2: 100% 100% 100% 100%  Weight:       CBC Latest Ref Rng & Units 12/09/2019 12/08/2019 12/07/2019  WBC 4.0 - 10.5 K/uL 6.7 5.7 6.0  Hemoglobin 12.0 - 15.0 g/dL 10.8(L) 10.8(L) 11.3(L)  Hematocrit 36.0 - 46.0 % 33.7(L) 33.1(L) 35.9(L)  Platelets 150 - 400 K/uL 206 214 245   CMP Latest Ref Rng & Units 12/09/2019 12/08/2019 12/07/2019  Glucose 70 - 99 mg/dL 76 91 89  BUN 8 - 23 mg/dL 9 13 16   Creatinine 0.44 - 1.00 mg/dL 1.01(H) 1.00 1.10(H)  Sodium 135 - 145 mmol/L 143 145 145  Potassium 3.5 - 5.1 mmol/L 3.4(L) 3.4(L) 3.3(L)  Chloride 98 - 111 mmol/L 112(H) 112(H) 111  CO2 22 - 32 mmol/L 21(L) 23 25  Calcium 8.9 - 10.3 mg/dL 8.9 8.8(L) 9.1  Total Protein 6.5 - 8.1 g/dL - - -  Total Bilirubin 0.3 - 1.2 mg/dL - - -  Alkaline Phos 38 - 126 U/L - - -  AST 15 - 41 U/L - - -  ALT 0 - 44 U/L - - -   Constitutional: sitting in bedside chair, NAD HEENT Head: [place], AT Eyes: Conjunctivae normal, EOM nl Cardiovascular: RRR, nl S1 and S2, no murmurs, rubs or gallops noted; RLE chronically larger in diameter than left, no tenderness, pitting edema or erythema in bilateral lower extremities Respiratory: Effort and breath sounds nl. No respiratory distress, no wheezing, crackles or rhonchi noted GI/Abdomen: Nondistended, soft, normoactive bowel sounds; non-tender Musculoskeletal:  spontaneous movement of all extremities, chronic RLE > LLE Neurologic: alert and oriented x3, no focal deficits noted; did have some confusion Skin: non-diaphoretic, no rash or erythema noted, chronic venous insufficiency changes noted in RLE  Assessment/Plan: [name] is a 69 year old female with PMHx of CAD, HTN, HLD, asthma, OSA, obesity, GERD, chronic pain from fibromyalgia, lower extremity venous stasis, bipolar depression with history of recurrent falls presenting with AMS and AKI.   Altered mental status in setting of AKI and polypharmacy: Hospital delirium: Patient presented with altered mental status in setting of AKI due to overdiuresis. Also suspect polypharmacy contributing to her AMS as she was on multiple centrally acting medications. Patient improved with IV fluids and serum creatinine remains stable. Suspect may also have component of delirium as symptoms are waxing and waning. - Continue to hold lasix - Strict I&O  - Daily weights - BMP monitoring  - Avoid centrally acting and/or nephrotoxic agents - TOC consulted for SNF placement, appreciate their assistance   Acute left frontal infarct:  Patient noted to have subcentimeter infarction of left frontal white matter on MRI. 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 Echo with LVEF 60-65% without RWMA. Carotid US without significant stenosis. No focal deficits noted on examination - Aspirin 81mg  daily - Atorvastatin 40mg  daily - PT/OT evaluation: recommending for SNF  Chronic venous insufficiency::  Has chronic venous insufficiency of right lower extremity that was treated with lasix and torsemide. However, concerns for overdiuresis with diuretics. Currently stable  - Compression stockings   Prior to Admission Living Arrangement: SNF Anticipated Discharge Location:  SNF Barriers to Discharge: pending SNF placement  Dispo: Anticipated discharge in approximately 0-1 day(s).   Harvie Heck, MD  Internal Medicine, PGY-1 12/09/2019, 6:16 AM Pager: [phone]

## 2019-12-10 ENCOUNTER — Inpatient Hospital Stay (HOSPITAL_COMMUNITY): Payer: Medicare Other

## 2019-12-10 DIAGNOSIS — R4182 Altered mental status, unspecified: Secondary | ICD-10-CM | POA: Diagnosis not present

## 2019-12-10 DIAGNOSIS — I63511 Cerebral infarction due to unspecified occlusion or stenosis of right middle cerebral artery: Secondary | ICD-10-CM | POA: Diagnosis not present

## 2019-12-10 DIAGNOSIS — I872 Venous insufficiency (chronic) (peripheral): Secondary | ICD-10-CM | POA: Diagnosis not present

## 2019-12-10 DIAGNOSIS — N179 Acute kidney failure, unspecified: Secondary | ICD-10-CM | POA: Diagnosis not present

## 2019-12-10 LAB — RENAL FUNCTION PANEL
Albumin: 2.7 g/dL — ABNORMAL LOW (ref 3.5–5.0)
Anion gap: 7 (ref 5–15)
BUN: 6 mg/dL — ABNORMAL LOW (ref 8–23)
CO2: 22 mmol/L (ref 22–32)
Calcium: 8.9 mg/dL (ref 8.9–10.3)
Chloride: 115 mmol/L — ABNORMAL HIGH (ref 98–111)
Creatinine, Ser: 1.02 mg/dL — ABNORMAL HIGH (ref 0.44–1.00)
GFR calc Af Amer: >60 mL/min (ref >60)
GFR calc non Af Amer: 56 mL/min — ABNORMAL LOW (ref >60)
Glucose, Bld: 76 mg/dL (ref 70–99)
Phosphorus: 2.3 mg/dL — ABNORMAL LOW (ref 2.5–4.6)
Potassium: 3.5 mmol/L (ref 3.5–5.1)
Sodium: 144 mmol/L (ref 135–145)

## 2019-12-10 LAB — CBC
HCT: 36 % (ref 36.0–46.0)
Hemoglobin: 11.6 g/dL — ABNORMAL LOW (ref 12.0–15.0)
MCH: 28.2 pg (ref 26.0–34.0)
MCHC: 32.2 g/dL (ref 30.0–36.0)
MCV: 87.4 fL (ref 80.0–100.0)
Platelets: 217 10*3/uL (ref 150–400)
RBC: 4.12 MIL/uL (ref 3.87–5.11)
RDW: 14.1 % (ref 11.5–15.5)
WBC: 7.7 10*3/uL (ref 4.0–10.5)
nRBC: 0 % (ref 0.0–0.2)

## 2019-12-10 MED ORDER — LACTATED RINGERS IV BOLUS
500.0000 mL | Freq: Once | INTRAVENOUS | Status: AC
  Administered 2019-12-10: 500 mL via INTRAVENOUS

## 2019-12-10 NOTE — Progress Notes (Signed)
[facility] notified of cancelled discharge

## 2019-12-10 NOTE — Progress Notes (Signed)
500 cc of LR bolus given. Bladder scan shows 78cc of urine. Patient doesn't have any urge to urinate. MD made aware.

## 2019-12-10 NOTE — Care Management Important Message (Signed)
Important Message  Patient Details  Name: Alisha [name] MRN: [id number] Date of Birth: [birth year]   Medicare Important Message Given:  Yes     Delainee Tramel Montine Circle 12/10/2019, 2:58 PM

## 2019-12-10 NOTE — TOC Transition Note (Addendum)
Transition of Care [facility]) - CM/SW Discharge Note   Patient Details  Name: Alisha [name] MRN: [id number] Date of Birth: Mar 06, 1951  Transition of Care [facility]) CM/SW Contact:  Alexander Mt, LCSW Phone Number: 12/10/2019, 11:58 AM   Clinical Narrative:    5:16pm- CSW spoke with Dr. Marva Panda, pt still waiting on voiding, all information has been sent to SNF. Requested Dr. Margy Clarks team call pt daughter if pt does not indeed d/c today due to medical reasons.   1:02pm- Pt has completed paperwork for SNF, CSW was assisted with faxing to SNF [phone]) by colleague Miranda. CSW updated RN that paperwork completed and will let daughter know about transport timing when pt voids.    11:58am- CSW spoke with pt daughter Alisha [name], pt daughter would like pt to fill out paperwork. [facility] worried due to fluctuating mental status. Pt daughter states that she is giving verbal permission for pt to fill it out, she also has a copy. CSW confirmed with pt that she would like to complete paperwork on her own for the SNF instead of her daughter. CSW spoke with [place] who emailed [place] the paperwork. Paperwork printed and provided to pt along with a pen to fill out. Valtracy wants pt to call her with questions. She is aware discharge will be later today, dc summary/orders in. Valtracy had questions about oxygen, these are communicated to Dr. Marva Panda.   Await completion of paperwork and bedside RN Steffanie Dunn is waiting on pt to void before we arrange PTAR.    Final next level of care: Skilled Nursing Facility Barriers to Discharge: Barriers Resolved   Patient Goals and CMS Choice Patient states their goals for this hospitalization and ongoing recovery are:: continue rehab CMS Medicare.gov Compare Post Acute Care list provided to:: Patient(pt daughter requesting return to [facility]) Choice offered to / list presented to : Patient, Adult Children  Discharge Placement   Existing PASRR number confirmed : 12/08/19           Patient [facility] bed at: [facility] Patient to be transferred to facility by: [place] Name of family member notified: pt daughter Alisha [name] via telephone Patient and family notified of of transfer: 12/10/19  Discharge Plan and Services In-house Referral: Clinical Social Work Discharge Planning Services: CM Consult Post Acute Care Choice: Durable Medical Equipment              Readmission Risk Interventions Readmission Risk Prevention Plan 12/08/2019  Transportation Screening Complete  PCP or Specialist Appt within 5-7 Days Not Complete  Not Complete comments plan for SNF  Home Care Screening Complete  Medication Review (RN CM) Referral to Pharmacy  Some recent data might be hidden

## 2019-12-10 NOTE — Social Work (Signed)
Clinical Social Worker facilitated patient discharge including contacting patient family and facility to confirm patient discharge plans.  Clinical information faxed to facility and family agreeable with plan.  CSW arranged ambulance transport via PT[facility] to [facility] RN to call [phone]  with report prior to discharge.  Clinical Social Worker will sign off for now as social work intervention is no longer needed. Please consult us again if new need arises.  Malcolm Quast, MSW, LCSW Clinical Social Worker   

## 2019-12-10 NOTE — Progress Notes (Addendum)
Subjective: HD 5 Overnight, no acute events reported.  Patient evaluated at bedside this morning. Pt states that she didn't eat breakfast because she didn't want it. We discussed discharging the patient to a rehab facility, and she says that is ok. She complains of R leg pain that is unchanged from prior. States that it hurts "on the outside".  Objective:  Vital signs in last 24 hours: Vitals:   12/09/19 0800 12/09/19 1334 12/09/19 2055 12/10/19 0342  BP:  (!) 144/64 (!) 151/81 (!) 158/87  Pulse:  65 69 79  Resp:  16 18 18   Temp:  98.3 F (36.8 C) 98.4 F (36.9 C) 98.8 F (37.1 C)  TempSrc:  Oral Oral Oral  SpO2: 92% 100% 100% 100%  Weight:       CBC Latest Ref Rng & Units 12/10/2019 12/09/2019 12/08/2019  WBC 4.0 - 10.5 K/uL 7.7 6.7 5.7  Hemoglobin 12.0 - 15.0 g/dL 11.6(L) 10.8(L) 10.8(L)  Hematocrit 36.0 - 46.0 % 36.0 33.7(L) 33.1(L)  Platelets 150 - 400 K/uL 217 206 214   CMP Latest Ref Rng & Units 12/10/2019 12/09/2019 12/08/2019  Glucose 70 - 99 mg/dL 76 76 91  BUN 8 - 23 mg/dL 6(L) 9 13  Creatinine 0.44 - 1.00 mg/dL 1.02(H) 1.01(H) 1.00  Sodium 135 - 145 mmol/L 144 143 145  Potassium 3.5 - 5.1 mmol/L 3.5 3.4(L) 3.4(L)  Chloride 98 - 111 mmol/L 115(H) 112(H) 112(H)  CO2 22 - 32 mmol/L 22 21(L) 23  Calcium 8.9 - 10.3 mg/dL 8.9 8.9 8.8(L)  Total Protein 6.5 - 8.1 g/dL - - -  Total Bilirubin 0.3 - 1.2 mg/dL - - -  Alkaline Phos 38 - 126 U/L - - -  AST 15 - 41 U/L - - -  ALT 0 - 44 U/L - - -   Constitutional: sitting in bedside chair, NAD HEENT Head: [place], AT Eyes: Conjunctivae normal, EOM nl Cardiovascular: RRR, nl S1 and S2, no murmurs, rubs or gallops noted; RLE chronically larger in diameter than left, no tenderness, pitting edema or erythema in bilateral lower extremities Respiratory: Effort and breath sounds nl. No respiratory distress, no wheezing, crackles or rhonchi noted GI/Abdomen: Nondistended, soft, normoactive bowel sounds; non-tender Musculoskeletal:  spontaneous movement of all extremities, chronic RLE > LLE; RLE pain with mild warmth and tenderness to touch along lateral aspect of leg extending to right foot consistent with sciatic pain  Neurologic: alert and oriented x3, no focal deficits noted; did have some confusion Skin: non-diaphoretic, no rash or erythema noted, chronic venous insufficiency changes noted in RLE  Assessment/Plan: Alisha [name] is a 69 year old female with PMHx of CAD, HTN, HLD, asthma, OSA, obesity, GERD, chronic pain from fibromyalgia, lower extremity venous stasis, bipolar depression with history of recurrent falls presenting with AMS and AKI.   Altered mental status in setting of AKI and polypharmacy: Hospital delirium: Patient presented with altered mental status in setting of AKI due to overdiuresis. Also suspect polypharmacy contributing to her AMS as she was on multiple centrally acting medications. Patient improved with IV fluids and serum creatinine remains stable. Suspect may also have component of delirium as symptoms are waxing and waning. - Continue to hold lasix - Patient stable for discharge to SNF - Avoid centrally acting and/or nephrotoxic agents -discontinue foley catheter  Acute left frontal infarct:  Patient noted to have subcentimeter infarction of left frontal white matter on MRI. 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 Echo with LVEF 60-65% without RWMA. Carotid US without significant stenosis. No focal deficits noted on examination - Aspirin 81mg  daily - Atorvastatin 40mg  daily - PT/OT evaluation: recommending for SNF  Chronic venous insufficiency::  Has chronic venous insufficiency of right lower extremity that was treated with lasix and torsemide. However, concerns for overdiuresis with diuretics. Currently stable. Ongoing right lower  extremity pain that is similar to her sciatica pain from before. She is on gabapentin for this.  - Compression stockings   Prior to Admission Living Arrangement: SNF Anticipated Discharge Location: SNF Barriers to Discharge: None  Dispo: Anticipated discharge in approximately 0-1 day(s).   Harvie Heck, MD  Internal Medicine, PGY-1 12/10/2019, 7:07 AM Pager: [phone]

## 2019-12-11 DIAGNOSIS — Z888 Allergy status to other drugs, medicaments and biological substances status: Secondary | ICD-10-CM

## 2019-12-11 DIAGNOSIS — R4182 Altered mental status, unspecified: Secondary | ICD-10-CM | POA: Diagnosis not present

## 2019-12-11 DIAGNOSIS — N179 Acute kidney failure, unspecified: Secondary | ICD-10-CM | POA: Diagnosis not present

## 2019-12-11 DIAGNOSIS — Z885 Allergy status to narcotic agent status: Secondary | ICD-10-CM

## 2019-12-11 DIAGNOSIS — Z91012 Allergy to eggs: Secondary | ICD-10-CM

## 2019-12-11 DIAGNOSIS — Z886 Allergy status to analgesic agent status: Secondary | ICD-10-CM

## 2019-12-11 DIAGNOSIS — R339 Retention of urine, unspecified: Secondary | ICD-10-CM | POA: Diagnosis not present

## 2019-12-11 DIAGNOSIS — I63511 Cerebral infarction due to unspecified occlusion or stenosis of right middle cerebral artery: Secondary | ICD-10-CM | POA: Diagnosis not present

## 2019-12-11 DIAGNOSIS — Z960 Presence of urogenital implants: Secondary | ICD-10-CM

## 2019-12-11 DIAGNOSIS — Z91011 Allergy to milk products: Secondary | ICD-10-CM

## 2019-12-11 DIAGNOSIS — Z881 Allergy status to other antibiotic agents status: Secondary | ICD-10-CM

## 2019-12-11 NOTE — Progress Notes (Signed)
Pt's daughter, Bonney Roussel, called for update on pt status. RN answered all questions to satisfaction.

## 2019-12-11 NOTE — TOC Transition Note (Signed)
Transition of Care [facility]) - CM/SW Discharge Note   Patient Details  Name: Alisha [name] MRN: [id number] Date of Birth: 1951/01/02  Transition of Care [facility]) CM/SW Contact:  Alexander Mt, LCSW Phone Number: 12/11/2019, 10:24 AM   Clinical Narrative:    Pt aware of d/c timing, she states she will call [facility] today. CSW has arranged PTAR. New PTAR forms printed on chart.    Final next level of care: Skilled Nursing Facility Barriers to Discharge: Barriers Resolved   Patient Goals and CMS Choice Patient states their goals for this hospitalization and ongoing recovery are:: continue rehab CMS Medicare.gov Compare Post Acute Care list provided to:: Patient(pt daughter requesting return to [facility]) Choice offered to / list presented to : Patient, Adult Children  Discharge Placement   Existing PASRR number confirmed : 12/08/19          Patient chooses bed at: [facility] Patient to be transferred to facility by: [place] Name of family member notified: pt daughter Alisha [name] via telephone Patient and family notified of of transfer: 12/10/19  Discharge Plan and Services In-house Referral: Clinical Social Work Discharge Planning Services: CM Consult Post Acute Care Choice: Durable Medical Equipment            Readmission Risk Interventions Readmission Risk Prevention Plan 12/08/2019  Transportation Screening Complete  PCP or Specialist Appt within 5-7 Days Not Complete  Not Complete comments plan for SNF  Home Care Screening Complete  Medication Review (RN CM) Referral to Pharmacy  Some recent data might be hidden

## 2019-12-11 NOTE — Progress Notes (Signed)
AVS and social worker's paperwork placed in discharge packet. Report called and given to [facility], LPN at [facility]. All questions answered to satisfaction. PTAR to transport pt with all belongings.

## 2019-12-11 NOTE — Social Work (Signed)
Clinical Social Worker facilitated patient discharge including contacting patient family and facility to confirm patient discharge plans.  Clinical information faxed to facility and family agreeable with plan.  CSW arranged ambulance transport via PTAR to [place] to call [phone]  with report prior to discharge.  Clinical Social Worker will sign off for now as social work intervention is no longer needed. Please consult [place] again if new need arises.  Westley Hummer, MSW, LCSW Clinical Social Worker

## 2019-12-11 NOTE — Progress Notes (Signed)
Pt voided x 2 overnight. Pt ambulated to the bathroom and back to bed with 1 assist with a walker, tolerating well.

## 2019-12-11 NOTE — Progress Notes (Signed)
Subjective: HD 6 Patient to be discharged to SNF yesterday; however, concerns for inability to void after foley removal. Patient given 500cc bolus and spontaneously voided twice overnight.   Patient evaluated at bedside this morning. She is sleeping but easily arousable and notes no acute concerns today. Patient agreeable with discharge planning to SNF today.   Objective:  Vital signs in last 24 hours: Vitals:   12/10/19 1517 12/10/19 2000 12/10/19 2100 12/11/19 0525  BP: (!) 146/83 (!) 171/80 (!) 159/85 (!) 167/63  Pulse: 68 72  70  Resp: 18 17  17   Temp: 98.2 F (36.8 C) 98.2 F (36.8 C)  98.1 F (36.7 C)  TempSrc: Oral Oral  Oral  SpO2: 100% 100%  100%  Weight:       CBC Latest Ref Rng & Units 12/10/2019 12/09/2019 12/08/2019  WBC 4.0 - 10.5 K/uL 7.7 6.7 5.7  Hemoglobin 12.0 - 15.0 g/dL 11.6(L) 10.8(L) 10.8(L)  Hematocrit 36.0 - 46.0 % 36.0 33.7(L) 33.1(L)  Platelets 150 - 400 K/uL 217 206 214   CMP Latest Ref Rng & Units 12/10/2019 12/09/2019 12/08/2019  Glucose 70 - 99 mg/dL 76 76 91  BUN 8 - 23 mg/dL 6(L) 9 13  Creatinine 0.44 - 1.00 mg/dL 1.02(H) 1.01(H) 1.00  Sodium 135 - 145 mmol/L 144 143 145  Potassium 3.5 - 5.1 mmol/L 3.5 3.4(L) 3.4(L)  Chloride 98 - 111 mmol/L 115(H) 112(H) 112(H)  CO2 22 - 32 mmol/L 22 21(L) 23  Calcium 8.9 - 10.3 mg/dL 8.9 8.9 8.8(L)  Total Protein 6.5 - 8.1 g/dL - - -  Total Bilirubin 0.3 - 1.2 mg/dL - - -  Alkaline Phos 38 - 126 U/L - - -  AST 15 - 41 U/L - - -  ALT 0 - 44 U/L - - -   Constitutional: sitting in bedside chair, NAD HEENT Head: [place], AT Eyes: Conjunctivae normal, EOM nl Cardiovascular: RRR, nl S1 and S2, no murmurs, rubs or gallops noted; RLE chronically larger in diameter than left, no tenderness, pitting edema or erythema in bilateral lower extremities Respiratory: Effort and breath sounds nl. No respiratory distress, no wheezing, crackles or rhonchi noted GI/Abdomen: Nondistended, soft, normoactive bowel sounds;  non-tender Musculoskeletal: spontaneous movement of all extremities, chronic RLE > LLE Neurologic: easily arousable; alert and oriented x3, no focal deficits noted Skin: non-diaphoretic, no rash or erythema noted, chronic venous insufficiency changes noted in RLE  Assessment/Plan: [name] is a 69 year old female with PMHx of CAD, HTN, HLD, asthma, OSA, obesity, GERD, chronic pain from fibromyalgia, lower extremity venous stasis, bipolar depression with history of recurrent falls presenting with AMS and AKI.   Altered mental status in setting of AKI and polypharmacy: Hospital delirium: Patient presented with altered mental status in setting of AKI due to overdiuresis. Also suspect polypharmacy contributing to her AMS as she was on multiple centrally acting medications. Patient improved with IV fluids and serum creatinine remains stable. Suspect may also have component of delirium as symptoms are waxing and waning. - Continue to hold lasix - Patient stable for discharge to SNF - Avoid centrally acting and/or nephrotoxic agents  Urinary retention:  Patient initially noted to have >500cc of urine in bladder on presentation for which foley placed. Patient's home oxybutinin held during admission. On 4/14, foley removed and concerns for patient's inability to void for which she was observed overnight. She was only noted to have 50cc on bladder scan - given 500cc fluid bolus with spontaneous voiding  after. Patient is stable for discharge.  - Continue to hold oxybutinin  Acute left frontal infarct:  Patient noted to have subcentimeter infarction of left frontal white matter on MRI. 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 Echo with LVEF 60-65% without RWMA. Carotid US without significant stenosis. No focal deficits noted on examination - Aspirin  81mg  daily - Atorvastatin 40mg  daily - PT/OT evaluation: recommending for SNF  Chronic venous insufficiency::  Has chronic venous insufficiency of right lower extremity that was treated with lasix and torsemide. However, concerns for overdiuresis with diuretics. Currently stable. Ongoing right lower extremity pain that is similar to her sciatica pain from before. She is on gabapentin for this.  - Compression stockings    Prior to Admission Living Arrangement: SNF Anticipated Discharge Location: SNF Barriers to Discharge: None  Dispo: Anticipated discharge in approximately 0-1 day(s).   Harvie Heck, MD  Internal Medicine, PGY-1 12/11/2019, 6:57 AM Pager: [phone]

## 2019-12-11 NOTE — TOC Progression Note (Addendum)
Transition of Care [facility]) - Progression Note    Patient Details  Name: Alisha [name] MRN: [id number] Date of Birth: [birth year]  Transition of Care [facility]) CM/SW [place], [place] Phone Number: 12/11/2019, 10:19 AM  Clinical Narrative:    CSW spoke MD, pt has voided. CSW has rescheduled PTAR for 11:30am today, new d/c summary sent through hub.     Expected Discharge Plan: Skilled Nursing Facility Barriers to Discharge: Barriers Resolved  Expected Discharge Plan and Services Expected Discharge Plan: [place] In-house Referral: Clinical Social Work Discharge Planning Services: CM Consult Post Acute Care Choice: Durable Medical Equipment Living arrangements for the past 2 months: [place], [place] Expected Discharge Date: 12/11/19       Readmission Risk Interventions Readmission Risk Prevention Plan 12/08/2019  Transportation Screening Complete  PCP or Specialist Appt within 5-7 Days Not Complete  Not Complete comments plan for SNF  Home Care Screening Complete  Medication Review (RN CM) Referral to Pharmacy  Some recent data might be hidden

## 2019-12-11 NOTE — Plan of Care (Signed)
  Problem: Education: Goal: Knowledge of General Education information will improve Description: Including pain rating scale, medication(s)/side effects and non-pharmacologic comfort measures Outcome: Progressing   Problem: Health Behavior/Discharge Planning: Goal: Ability to manage health-related needs will improve Outcome: Progressing   Problem: Clinical Measurements: Goal: Ability to maintain clinical measurements within normal limits will improve Outcome: Progressing Goal: Respiratory complications will improve Outcome: Progressing   Problem: Nutrition: Goal: Adequate nutrition will be maintained Outcome: Progressing   Problem: Elimination: Goal: Will not experience complications related to urinary retention Outcome: Progressing   Problem: Pain Managment: Goal: General experience of comfort will improve Outcome: Progressing   Problem: Safety: Goal: Ability to remain free from injury will improve Outcome: Progressing   Problem: Skin Integrity: Goal: Risk for impaired skin integrity will decrease Outcome: Progressing   

## 2019-12-11 NOTE — Progress Notes (Signed)
MD notified patient O2 sats 80 on Room air. Nurse placed on 2 L o2 Now sats 95%

## 2019-12-30 ENCOUNTER — Inpatient Hospital Stay (HOSPITAL_COMMUNITY)
Admission: EM | Admit: 2019-12-30 | Discharge: 2020-01-05 | DRG: 682 | Disposition: A | Discharge status: Skilled Nursing Facility | Payer: Medicare Other | Attending: Internal Medicine | Admitting: Internal Medicine

## 2019-12-30 ENCOUNTER — Emergency Department (HOSPITAL_COMMUNITY): Payer: Medicare Other

## 2019-12-30 ENCOUNTER — Other Ambulatory Visit: Payer: Self-pay

## 2019-12-30 ENCOUNTER — Encounter (HOSPITAL_COMMUNITY): Payer: Self-pay | Admitting: Emergency Medicine

## 2019-12-30 DIAGNOSIS — K1120 Sialoadenitis, unspecified: Secondary | ICD-10-CM | POA: Diagnosis present

## 2019-12-30 DIAGNOSIS — G9341 Metabolic encephalopathy: Secondary | ICD-10-CM | POA: Diagnosis present

## 2019-12-30 DIAGNOSIS — M19072 Primary osteoarthritis, left ankle and foot: Secondary | ICD-10-CM | POA: Diagnosis present

## 2019-12-30 DIAGNOSIS — M19079 Primary osteoarthritis, unspecified ankle and foot: Secondary | ICD-10-CM | POA: Diagnosis present

## 2019-12-30 DIAGNOSIS — J323 Chronic sphenoidal sinusitis: Secondary | ICD-10-CM | POA: Diagnosis present

## 2019-12-30 DIAGNOSIS — E1151 Type 2 diabetes mellitus with diabetic peripheral angiopathy without gangrene: Secondary | ICD-10-CM | POA: Diagnosis present

## 2019-12-30 DIAGNOSIS — M25572 Pain in left ankle and joints of left foot: Secondary | ICD-10-CM | POA: Diagnosis not present

## 2019-12-30 DIAGNOSIS — R339 Retention of urine, unspecified: Secondary | ICD-10-CM | POA: Diagnosis not present

## 2019-12-30 DIAGNOSIS — Z7984 Long term (current) use of oral hypoglycemic drugs: Secondary | ICD-10-CM

## 2019-12-30 DIAGNOSIS — Z7902 Long term (current) use of antithrombotics/antiplatelets: Secondary | ICD-10-CM

## 2019-12-30 DIAGNOSIS — I2510 Atherosclerotic heart disease of native coronary artery without angina pectoris: Secondary | ICD-10-CM | POA: Diagnosis present

## 2019-12-30 DIAGNOSIS — Z9861 Coronary angioplasty status: Secondary | ICD-10-CM

## 2019-12-30 DIAGNOSIS — F19959 Other psychoactive substance use, unspecified with psychoactive substance-induced psychotic disorder, unspecified: Secondary | ICD-10-CM | POA: Diagnosis not present

## 2019-12-30 DIAGNOSIS — Z981 Arthrodesis status: Secondary | ICD-10-CM

## 2019-12-30 DIAGNOSIS — N179 Acute kidney failure, unspecified: Secondary | ICD-10-CM | POA: Diagnosis not present

## 2019-12-30 DIAGNOSIS — M109 Gout, unspecified: Secondary | ICD-10-CM | POA: Diagnosis present

## 2019-12-30 DIAGNOSIS — E861 Hypovolemia: Secondary | ICD-10-CM | POA: Diagnosis present

## 2019-12-30 DIAGNOSIS — Z79899 Other long term (current) drug therapy: Secondary | ICD-10-CM

## 2019-12-30 DIAGNOSIS — M79672 Pain in left foot: Secondary | ICD-10-CM | POA: Diagnosis present

## 2019-12-30 DIAGNOSIS — Z20822 Contact with and (suspected) exposure to covid-19: Secondary | ICD-10-CM | POA: Diagnosis present

## 2019-12-30 DIAGNOSIS — R262 Difficulty in walking, not elsewhere classified: Secondary | ICD-10-CM | POA: Diagnosis present

## 2019-12-30 DIAGNOSIS — T39395A Adverse effect of other nonsteroidal anti-inflammatory drugs [NSAID], initial encounter: Secondary | ICD-10-CM | POA: Diagnosis present

## 2019-12-30 DIAGNOSIS — K1121 Acute sialoadenitis: Secondary | ICD-10-CM | POA: Diagnosis not present

## 2019-12-30 DIAGNOSIS — E1122 Type 2 diabetes mellitus with diabetic chronic kidney disease: Secondary | ICD-10-CM | POA: Diagnosis present

## 2019-12-30 DIAGNOSIS — Z809 Family history of malignant neoplasm, unspecified: Secondary | ICD-10-CM

## 2019-12-30 DIAGNOSIS — E876 Hypokalemia: Secondary | ICD-10-CM | POA: Diagnosis not present

## 2019-12-30 DIAGNOSIS — I872 Venous insufficiency (chronic) (peripheral): Secondary | ICD-10-CM | POA: Diagnosis not present

## 2019-12-30 DIAGNOSIS — Z7982 Long term (current) use of aspirin: Secondary | ICD-10-CM

## 2019-12-30 DIAGNOSIS — Z6841 Body Mass Index (BMI) 40.0 and over, adult: Secondary | ICD-10-CM

## 2019-12-30 DIAGNOSIS — N189 Chronic kidney disease, unspecified: Secondary | ICD-10-CM | POA: Diagnosis present

## 2019-12-30 DIAGNOSIS — E785 Hyperlipidemia, unspecified: Secondary | ICD-10-CM | POA: Diagnosis present

## 2019-12-30 DIAGNOSIS — R0603 Acute respiratory distress: Secondary | ICD-10-CM | POA: Diagnosis not present

## 2019-12-30 DIAGNOSIS — M797 Fibromyalgia: Secondary | ICD-10-CM | POA: Diagnosis present

## 2019-12-30 DIAGNOSIS — F319 Bipolar disorder, unspecified: Secondary | ICD-10-CM | POA: Diagnosis present

## 2019-12-30 DIAGNOSIS — R609 Edema, unspecified: Secondary | ICD-10-CM | POA: Diagnosis present

## 2019-12-30 DIAGNOSIS — J45909 Unspecified asthma, uncomplicated: Secondary | ICD-10-CM | POA: Diagnosis present

## 2019-12-30 DIAGNOSIS — I129 Hypertensive chronic kidney disease with stage 1 through stage 4 chronic kidney disease, or unspecified chronic kidney disease: Secondary | ICD-10-CM | POA: Diagnosis present

## 2019-12-30 DIAGNOSIS — K219 Gastro-esophageal reflux disease without esophagitis: Secondary | ICD-10-CM | POA: Diagnosis present

## 2019-12-30 DIAGNOSIS — E11649 Type 2 diabetes mellitus with hypoglycemia without coma: Secondary | ICD-10-CM | POA: Diagnosis present

## 2019-12-30 DIAGNOSIS — L03221 Cellulitis of neck: Secondary | ICD-10-CM | POA: Diagnosis not present

## 2019-12-30 DIAGNOSIS — I10 Essential (primary) hypertension: Secondary | ICD-10-CM

## 2019-12-30 DIAGNOSIS — Z96653 Presence of artificial knee joint, bilateral: Secondary | ICD-10-CM | POA: Diagnosis present

## 2019-12-30 DIAGNOSIS — Z8249 Family history of ischemic heart disease and other diseases of the circulatory system: Secondary | ICD-10-CM

## 2019-12-30 DIAGNOSIS — Z87442 Personal history of urinary calculi: Secondary | ICD-10-CM

## 2019-12-30 DIAGNOSIS — W19XXXA Unspecified fall, initial encounter: Secondary | ICD-10-CM

## 2019-12-30 HISTORY — DX: Acute kidney failure, unspecified: N17.9

## 2019-12-30 LAB — CBC
HCT: 38.2 % (ref 36.0–46.0)
Hemoglobin: 12 g/dL (ref 12.0–15.0)
MCH: 28.7 pg (ref 26.0–34.0)
MCHC: 31.4 g/dL (ref 30.0–36.0)
MCV: 91.4 fL (ref 80.0–100.0)
Platelets: 275 10*3/uL (ref 150–400)
RBC: 4.18 MIL/uL (ref 3.87–5.11)
RDW: 16 % — ABNORMAL HIGH (ref 11.5–15.5)
WBC: 7.8 10*3/uL (ref 4.0–10.5)
nRBC: 0 % (ref 0.0–0.2)

## 2019-12-30 LAB — CBG MONITORING, ED
Glucose-Capillary: 47 mg/dL — ABNORMAL LOW (ref 70–99)
Glucose-Capillary: 58 mg/dL — ABNORMAL LOW (ref 70–99)
Glucose-Capillary: 72 mg/dL (ref 70–99)
Glucose-Capillary: 70 mg/dL (ref 70–99)

## 2019-12-30 LAB — URINALYSIS, ROUTINE W REFLEX MICROSCOPIC
Bilirubin Urine: NEGATIVE
Glucose, UA: NEGATIVE mg/dL
Hgb urine dipstick: NEGATIVE
Ketones, ur: NEGATIVE mg/dL
Leukocytes,Ua: NEGATIVE
Nitrite: NEGATIVE
Protein, ur: NEGATIVE mg/dL
Specific Gravity, Urine: 1.013 (ref 1.005–1.030)
pH: 5 (ref 5.0–8.0)

## 2019-12-30 LAB — RESPIRATORY PANEL BY RT PCR (FLU A&B, COVID)
Influenza A by PCR: NEGATIVE
Influenza B by PCR: NEGATIVE
SARS Coronavirus 2 by RT PCR: NEGATIVE

## 2019-12-30 LAB — COMPREHENSIVE METABOLIC PANEL
ALT: 15 U/L (ref 0–44)
AST: 23 U/L (ref 15–41)
Albumin: 3.5 g/dL (ref 3.5–5.0)
Alkaline Phosphatase: 106 U/L (ref 38–126)
Anion gap: 14 (ref 5–15)
BUN: 31 mg/dL — ABNORMAL HIGH (ref 8–23)
CO2: 18 mmol/L — ABNORMAL LOW (ref 22–32)
Calcium: 9.6 mg/dL (ref 8.9–10.3)
Chloride: 110 mmol/L (ref 98–111)
Creatinine, Ser: 2.43 mg/dL — ABNORMAL HIGH (ref 0.44–1.00)
GFR calc Af Amer: 23 mL/min — ABNORMAL LOW (ref >60)
GFR calc non Af Amer: 19 mL/min — ABNORMAL LOW (ref >60)
Glucose, Bld: 62 mg/dL — ABNORMAL LOW (ref 70–99)
Potassium: 4.1 mmol/L (ref 3.5–5.1)
Sodium: 142 mmol/L (ref 135–145)
Total Bilirubin: 1.2 mg/dL (ref 0.3–1.2)
Total Protein: 8.2 g/dL — ABNORMAL HIGH (ref 6.5–8.1)

## 2019-12-30 LAB — BASIC METABOLIC PANEL
Anion gap: 8 (ref 5–15)
BUN: 26 mg/dL — ABNORMAL HIGH (ref 8–23)
CO2: 23 mmol/L (ref 22–32)
Calcium: 9.3 mg/dL (ref 8.9–10.3)
Chloride: 112 mmol/L — ABNORMAL HIGH (ref 98–111)
Creatinine, Ser: 1.79 mg/dL — ABNORMAL HIGH (ref 0.44–1.00)
GFR calc Af Amer: 33 mL/min — ABNORMAL LOW (ref >60)
GFR calc non Af Amer: 28 mL/min — ABNORMAL LOW (ref >60)
Glucose, Bld: 84 mg/dL (ref 70–99)
Potassium: 4.4 mmol/L (ref 3.5–5.1)
Sodium: 143 mmol/L (ref 135–145)

## 2019-12-30 LAB — PROTIME-INR
INR: 1.1 (ref 0.8–1.2)
Prothrombin Time: 13.3 seconds (ref 11.4–15.2)

## 2019-12-30 LAB — LACTIC ACID, PLASMA: Lactic Acid, Venous: 1 mmol/L (ref 0.5–1.9)

## 2019-12-30 LAB — APTT: aPTT: 32 seconds (ref 24–36)

## 2019-12-30 LAB — BRAIN NATRIURETIC PEPTIDE: B Natriuretic Peptide: 54.5 pg/mL (ref 0.0–100.0)

## 2019-12-30 LAB — GLUCOSE, CAPILLARY: Glucose-Capillary: 73 mg/dL (ref 70–99)

## 2019-12-30 MED ORDER — LORATADINE 10 MG PO TABS
10.0000 mg | ORAL_TABLET | Freq: Every day | ORAL | Status: DC
  Administered 2019-12-30 – 2020-01-05 (×5): 10 mg via ORAL
  Filled 2019-12-30 (×6): qty 1

## 2019-12-30 MED ORDER — CLOPIDOGREL BISULFATE 75 MG PO TABS
75.0000 mg | ORAL_TABLET | Freq: Every day | ORAL | Status: DC
  Administered 2019-12-30 – 2019-12-31 (×2): 75 mg via ORAL
  Filled 2019-12-30 (×2): qty 1

## 2019-12-30 MED ORDER — ONDANSETRON HCL 4 MG/2ML IJ SOLN
4.0000 mg | Freq: Four times a day (QID) | INTRAMUSCULAR | Status: DC | PRN
  Administered 2020-01-04: 4 mg via INTRAVENOUS
  Filled 2019-12-30 (×2): qty 2

## 2019-12-30 MED ORDER — ASPIRIN EC 81 MG PO TBEC
81.0000 mg | DELAYED_RELEASE_TABLET | Freq: Every day | ORAL | Status: DC
  Administered 2019-12-30 – 2019-12-31 (×2): 81 mg via ORAL
  Filled 2019-12-30 (×2): qty 1

## 2019-12-30 MED ORDER — LACTATED RINGERS IV BOLUS
1000.0000 mL | Freq: Once | INTRAVENOUS | Status: AC
  Administered 2019-12-30: 1000 mL via INTRAVENOUS

## 2019-12-30 MED ORDER — ATORVASTATIN CALCIUM 80 MG PO TABS
80.0000 mg | ORAL_TABLET | Freq: Every day | ORAL | Status: DC
  Administered 2019-12-30 – 2020-01-04 (×5): 80 mg via ORAL
  Filled 2019-12-30 (×5): qty 1

## 2019-12-30 MED ORDER — ONDANSETRON HCL 4 MG PO TABS: 4.0000 mg | ORAL_TABLET | Freq: Four times a day (QID) | ORAL | Status: DC | PRN

## 2019-12-30 MED ORDER — ACETAMINOPHEN 325 MG PO TABS: 650.0000 mg | ORAL_TABLET | Freq: Four times a day (QID) | ORAL | Status: DC | PRN

## 2019-12-30 MED ORDER — DULOXETINE HCL 60 MG PO CPEP
60.0000 mg | ORAL_CAPSULE | Freq: Every day | ORAL | Status: DC
  Administered 2019-12-30 – 2020-01-05 (×5): 60 mg via ORAL
  Filled 2019-12-30 (×7): qty 1

## 2019-12-30 MED ORDER — ALBUTEROL SULFATE HFA 108 (90 BASE) MCG/ACT IN AERS: 1.0000 | INHALATION_SPRAY | Freq: Four times a day (QID) | RESPIRATORY_TRACT | Status: DC | PRN

## 2019-12-30 MED ORDER — ALBUTEROL SULFATE (2.5 MG/3ML) 0.083% IN NEBU: 2.5000 mg | INHALATION_SOLUTION | Freq: Four times a day (QID) | RESPIRATORY_TRACT | Status: DC | PRN

## 2019-12-30 MED ORDER — OXYCODONE HCL 5 MG PO TABS
5.0000 mg | ORAL_TABLET | Freq: Two times a day (BID) | ORAL | Status: DC | PRN
  Administered 2020-01-01: 5 mg via ORAL
  Filled 2019-12-30: qty 1

## 2019-12-30 MED ORDER — POLYETHYLENE GLYCOL 3350 17 G PO PACK: 17.0000 g | PACK | Freq: Every day | ORAL | Status: DC | PRN

## 2019-12-30 MED ORDER — ENOXAPARIN SODIUM 30 MG/0.3ML ~~LOC~~ SOLN
30.0000 mg | SUBCUTANEOUS | Status: DC
  Administered 2019-12-30 – 2019-12-31 (×2): 30 mg via SUBCUTANEOUS
  Filled 2019-12-30: qty 0.3

## 2019-12-30 MED ORDER — ACETAMINOPHEN 650 MG RE SUPP: 650.0000 mg | Freq: Four times a day (QID) | RECTAL | Status: DC | PRN

## 2019-12-30 MED ORDER — MOMETASONE FURO-FORMOTEROL FUM 100-5 MCG/ACT IN AERO
2.0000 | INHALATION_SPRAY | Freq: Two times a day (BID) | RESPIRATORY_TRACT | Status: DC
  Administered 2019-12-30 – 2020-01-05 (×9): 2 via RESPIRATORY_TRACT
  Filled 2019-12-30 (×2): qty 8.8

## 2019-12-30 MED ORDER — PANTOPRAZOLE SODIUM 40 MG PO TBEC
40.0000 mg | DELAYED_RELEASE_TABLET | Freq: Two times a day (BID) | ORAL | Status: DC
  Administered 2019-12-30 – 2020-01-05 (×10): 40 mg via ORAL
  Filled 2019-12-30 (×11): qty 1

## 2019-12-30 MED ORDER — TOPIRAMATE 25 MG PO TABS
50.0000 mg | ORAL_TABLET | Freq: Every day | ORAL | Status: DC
  Administered 2019-12-30 – 2020-01-04 (×5): 50 mg via ORAL
  Filled 2019-12-30 (×6): qty 2

## 2019-12-30 MED ORDER — GABAPENTIN 300 MG PO CAPS
300.0000 mg | ORAL_CAPSULE | Freq: Two times a day (BID) | ORAL | Status: DC
  Administered 2019-12-30 – 2020-01-05 (×10): 300 mg via ORAL
  Filled 2019-12-30 (×11): qty 1

## 2019-12-30 NOTE — ED Notes (Signed)
Pt lost IV access while over in xray per radiology staff

## 2019-12-30 NOTE — ED Notes (Signed)
Pt became upset when told that Covid test had to be done for all admitted pt's. States she was already admitted and doesn't need one, but agreed to get swabbed. Pt is now upset at this RN, asking how long she has been practicing, and reluctant to have foley placed by this RN. Another CBG is in process, MD is aware of unstable sugars w/no IV access. Still awaiting IV team for another IV start.

## 2019-12-30 NOTE — ED Notes (Signed)
Pt given another 2 cups of OJ and graham crackers

## 2019-12-30 NOTE — ED Triage Notes (Signed)
Pt in from home w/family via [facility]. Recent admit 2 mo's ago for RLE pain/swelling, went to [place] few wks, and got released to home yesterday. Presents today w/LLE pain and swelling. Was stopped on her Lasix during stay at Rehab. Reports having catheter, which was taken out prior to release home, is c/o some urine hesitancy now. Hx of CHF, denies any sob or cp

## 2019-12-30 NOTE — ED Notes (Signed)
Pt given graham crackers and 2 cups of orange juice for low CBG. MD notified, will recheck

## 2019-12-30 NOTE — H&P (Signed)
Date: 12/30/2019               Patient Name:  Alisha [name] MRN: [id number]  DOB: 04-25-1951 Age / Sex: 69 y.o., female   PCP: Sonia Side., FNP         Medical Service: Internal Medicine Teaching Service         Attending Physician: Dr. Velna Ochs, MD    First Contact: Dr. Sheppard Coil Pager: [phone]  Second Contact: Dr. Koleen Distance Pager: [phone]       After Hours (After 5p/  First Contact Pager: [phone]  weekends / holidays): Second Contact Pager: [phone]   Chief Complaint: Left foot pain  History of Present Illness: [name] is a 69 y.o female with fibromyalgia, bipolar, PAD, CAD, chronic venous insufficiency, and HTN who presented to the ED via EMS with left foot pain and swelling. History was obtained via the patient and through chart review.   Patient states that she was discharged from [facility] yesterday morning in her normal state of health. Shortly after arriving home she developed acute onset left foot pain. The pain was all over the foot and felt like she was wearing shoe. She tried an arthritis pill without any significant relief and then was given a heating pad. The heating pad seemed to improve her pain. Shortly thereafter she tried to get up to go to the bathroom but with any pressure on her left foot felt excruciating pain. She was unable to walk. She subsequently sat on Chuck's pad so she didn't have to get up to use the restroom. Her daughter encouraged her to go to the hospital but she declined. This morning she continued to have pain and that is what prompted her to come to the ED.  She denies any trauma to the left foot. She states that 4 to 5 days prior to discharge she did twist her ankle and fell but did not have any pain in the days after. She is never had gout before. She has had surgeries on the left ankle but does not recall which ones. She has been using several "arthritis pills" to help with the pain and has not been  drinking or eating very much. She denies systemic signs of infection including fevers.  Attempted to contact daughter but was unsuccessful.   Meds:  No current facility-administered medications on file prior to encounter.   Current Outpatient Medications on File Prior to Encounter  Medication Sig Dispense Refill  . albuterol (PROVENTIL) (2.5 MG/3ML) 0.083% nebulizer solution Take 3 mLs (2.5 mg total) by nebulization every 6 (six) hours as needed for wheezing or shortness of breath. 150 mL 1  . albuterol (VENTOLIN HFA) 108 (90 Base) MCG/ACT inhaler Inhale 1 puff into the lungs every 6 (six) hours as needed for wheezing or shortness of breath.    Marland Kitchen amitriptyline (ELAVIL) 50 MG tablet Take 1 tablet (50 mg total) by mouth at bedtime. 30 tablet 0  . aspirin EC 81 MG tablet Take 81 mg by mouth daily.    Marland Kitchen atorvastatin (LIPITOR) 80 MG tablet Take 80 mg by mouth at bedtime.     . budesonide-formoterol (SYMBICORT) 80-4.5 MCG/ACT inhaler Inhale 2 puffs into the lungs 2 (two) times daily. 1 Inhaler 11  . clopidogrel (PLAVIX) 75 MG tablet Take 1 tablet (75 mg total) by mouth daily. 30 tablet 11  . cyclobenzaprine (FLEXERIL) 5 MG tablet Take 1 tablet (5 mg total) by mouth 3 (three) times daily as  needed for muscle spasms. 10 tablet 0  . DULoxetine (CYMBALTA) 60 MG capsule Take 1 capsule (60 mg total) by mouth daily. 90 capsule 3  . gabapentin (NEURONTIN) 300 MG capsule Take 300 mg by mouth 3 (three) times daily.    Marland Kitchen loratadine (CLARITIN) 10 MG tablet Take 10 mg by mouth daily.    Marland Kitchen losartan (COZAAR) 100 MG tablet Take 1 tablet (100 mg total) daily by mouth. 90 tablet 0  . metFORMIN (GLUCOPHAGE) 500 MG tablet Take 500 mg by mouth 2 (two) times daily with a meal.    . mirabegron ER (MYRBETRIQ) 50 MG TB24 tablet Take 50 mg by mouth daily.    Marland Kitchen oxybutynin (DITROPAN XL) 15 MG 24 hr tablet Take 15 mg by mouth daily.    . OXYGEN Inhale 2 L into the lungs continuous. Via nasal cannula to help keep sats >90%      . pantoprazole (PROTONIX) 40 MG tablet Take 40 mg by mouth 2 (two) times daily.    Marland Kitchen SPIRIVA RESPIMAT 1.25 MCG/ACT AERS Inhale 2 puffs into the lungs daily.     Marland Kitchen tiZANidine (ZANAFLEX) 4 MG tablet Take 1.5 tablets (6 mg total) by mouth daily as needed for muscle spasms. 30 tablet 0  . topiramate (TOPAMAX) 50 MG tablet Take 50 mg by mouth at bedtime.     Allergies: Allergies as of 12/30/2019 - Review Complete 12/30/2019  Allergen Reaction Noted  . Ibuprofen  08/15/2012  . Ampicillin Nausea Only 09/25/2016  . Asa [aspirin] Diarrhea 04/12/2004  . Darvon [propoxyphene] Nausea And Vomiting 04/12/2004  . Demeclocycline Other (See Comments) 09/25/2016  . Eggs or egg-derived products Diarrhea 02/19/2017  . Lactalbumin Diarrhea 02/19/2017  . Lisinopril Cough 05/04/2017  . Milk-related compounds Diarrhea 02/19/2017  . Salicylates Other (See Comments) 09/25/2016  . Tetracycline hcl Hives and Nausea Only 04/12/2004  . Tetracyclines & related Other (See Comments) 09/25/2016   Past Medical History:  Diagnosis Date  . Arthritis   . Asthma   . Bipolar disorder ([place])   . CAD (coronary artery disease) 07/07/2016   a. s/p stenting x 2 in [place] to the LAD, RCA is chronically occluded, LVEDP was 34  . Cancer (HCC)    cervical  . Depression   . Fibromyalgia   . GERD (gastroesophageal reflux disease)   . History of benign pituitary tumor   . History of kidney stones    1972  . Hyperlipidemia   . Hypertension   . Migraine   . Morbid obesity ([place])   . Peripheral vascular disease ([place])   . Pneumonia   . Seizures ([place])    during surgery for pituitary tumor  . Sleep apnea    with cpap  . Vertigo    Family History  Problem Relation Age of Onset  . Heart disease Mother 6       CABG, pacemaker, valve  . Cancer Father   . Heart disease Sister        Fluid around the heart  . Breast cancer Neg Hx    Social History   Socioeconomic History  . Marital status: Divorced    Spouse name: Not  on file  . Number of children: Not on file  . Years of education: Not on file  . Highest education level: Not on file  Occupational History  . Not on file  Tobacco Use  . Smoking status: Never Smoker  . Smokeless tobacco: Never Used  Substance and Sexual Activity  . Alcohol  use: Yes    Comment: Socially. [name].  . Drug use: No  . Sexual activity: Not Currently  Other Topics Concern  . Not on file  Social History Narrative   Current Social History        Who lives at home: Lives with youngest daughter, "Olivia Mackie" 09/25/2016    Transportation: Public [id number]   Important Relationships & Pets: 2 daughters (52, 37), 1 son (78), 10 grandchildren, 4 great grandchildren 09/25/2016    Current Stressors: Recently moved from [place], [place] due to threat of NH placement 09/25/2016   Religious / Personal Beliefs: Christian 09/25/2016   Interests / Fun: Dancing 09/25/2016   Other: Participates in [employer] 09/25/2016   Social Determinants of Health   Financial Resource Strain:   . Difficulty of Paying Living Expenses:   Food Insecurity:   . Worried About Charity fundraiser in the Last Year:   . Arboriculturist in the Last Year:   Transportation Needs:   . Film/video editor (Medical):   Marland Kitchen Lack of Transportation (Non-Medical):   Physical Activity:   . Days of Exercise per Week:   . Minutes of Exercise per Session:   Stress:   . Feeling of Stress :   Social Connections:   . Frequency of Communication with Friends and Family:   . Frequency of Social Gatherings with Friends and Family:   . Attends Religious Services:   . Active Member of Clubs or Organizations:   . Attends Archivist Meetings:   Marland Kitchen Marital Status:   Intimate Partner Violence:   . Fear of Current or Ex-Partner:   . Emotionally Abused:   Marland Kitchen Physically Abused:   . Sexually Abused:    Review of Systems: A complete ROS was negative except as per HPI.   Physical Exam: Blood pressure (!) 134/58, pulse  78, temperature 98.5 F (36.9 C), resp. rate 15, weight 122 kg, SpO2 99 %.  General: Obese female in no acute distress HENT: Normocephalic, atraumatic, dry mucus membranes Pulm: Good air movement with no wheezing or crackles  CV: RRR, no murmurs, no rubs  Abdomen: Active bowel sounds, soft, non-distended, no tenderness to palpation  Extremities: Pulses palpable in all extremities, 1-2+ pitting edema bilaterally. Left foot is slight more warm than the right. Tenderness to palpation of the navicular bone and base of the heal. Pain elicited with inversion and eversion of the left foot. Skin: Warm and dry  Neuro: Alert and oriented x 3  EKG: personally reviewed my interpretation is sinus rhythm  Assessment & Plan by Problem: Active Problems:   AKI (acute kidney injury) ([place])  Swara Mutschler is a 69 y.o female with fibromyalgia, bipolar, PAD, CAD, chronic venous insufficiency, and HTN who presented to the ED via EMS with acute onset left foot pain and swelling. She was subsequently found to have an AKI and hypoglycemia. She was admitted for further evaluation and management.   Left Foot/Ankle Pain  - Patient presenting with acute onset left foot/ankle pain and swelling. Tenderness to palpation of th Navicular bone with pain elicited with inversion and eversion of the foot. No signs of cellulitis. Atypically for gout presentation. X-rays without evidence of fracture. No recent trauma. Possibly arthritic in nature - Pain control with Acetaminophen and low dose PRN oxy given AKI and age   AKI  - Patient appears hypovolemic on PE. Suspect this is hemodynamically mediated with use of NSAIDs for pain  - PVR to rule out obstruction  given history  - Will give 1L bolus and recheck  - Follow-up UA - Hold nephrotoxic medications  - Follow-up BMP this afternoon. If not improving with fluids will check urine studies   Encephalopathy, Metabolic/Toxic  - Initially alerted per EDP report. Alert and  oriented x 3 now. Likely multifactorial due to decreased clearance of centrally acting medications with AKI and neuroglycopenia  - Correct hypoglycemia and monitor CBGs  - Allow diet   - Hold centrally acting medications   Chronic venous insufficieny  - 1-2+ pitting edema on PE  - Continue wraps and elevation  - Diuretic therapy discontinued at last hospitalization due to AKI  HTN - Holding losartan in setting of AKI   DM  - On metformin as outpatient  - Currently hypoglycemic  - Hold metformin and monitor CBGs  Diet: Regular VTE ppx: Lovenox (renally adjusted) CODE STATUS: Full Code  Dispo: Admit patient to Observation with expected length of stay less than 2 midnights.  SignedIna Homes, MD 12/30/2019, 11:26 AM  Pager: [phone]

## 2019-12-30 NOTE — ED Provider Notes (Signed)
[facility] Emergency Department Provider Note MRN:  [id number]  Arrival date & time: 12/30/19     Chief Complaint   Leg Swelling   History of Present Illness   Alisha [name] is a 69 y.o. year-old female with a history of bipolar disorder, CAD, PVD presenting to the ED with chief complaint of leg swelling.  Recent discharge from the hospital, more recently becoming more confused than normal, not urinating, seems to be having some increase in her pain and swelling to her left leg.  I was unable to obtain an accurate HPI, PMH, or ROS due to the patient's confusion.  Level 5 caveat.  Review of Systems  Positive for leg pain, leg swelling, confusion.  Patient's Health History    Past Medical History:  Diagnosis Date  . Arthritis   . Asthma   . Bipolar disorder ([place])   . CAD (coronary artery disease) 07/07/2016   a. s/p stenting x 2 in [place] to the LAD, RCA is chronically occluded, LVEDP was 34  . Cancer (HCC)    cervical  . Depression   . Fibromyalgia   . GERD (gastroesophageal reflux disease)   . History of benign pituitary tumor   . History of kidney stones    1972  . Hyperlipidemia   . Hypertension   . Migraine   . Morbid obesity ([place])   . Peripheral vascular disease ([place])   . Pneumonia   . Seizures ([place])    during surgery for pituitary tumor  . Sleep apnea    with cpap  . Vertigo     Past Surgical History:  Procedure Laterality Date  . ANKLE FUSION Bilateral   . carpel tunnel release Bilateral 2017  . CORONARY ANGIOPLASTY    . ESOPHAGOGASTRODUODENOSCOPY (EGD) WITH PROPOFOL N/A 06/14/2017   Procedure: ESOPHAGOGASTRODUODENOSCOPY (EGD) WITH PROPOFOL;  Surgeon: Wilford Corner, MD;  Location: [facility];  Service: Endoscopy;  Laterality: N/A;  . EXCISION / CURETTAGE BONE CYST PHALANGES OF FOOT  2014   Removal of foot cyst   . PARTIAL HYSTERECTOMY  unknown   Patient still has ovaries  . pituitary tumor removal  1999   removed as much as they could, on optic nerve  . TONSILLECTOMY    . TOTAL KNEE ARTHROPLASTY     TKR X 3  2 on the left and 1 on the right    Family History  Problem Relation Age of Onset  . Heart disease Mother 44       CABG, pacemaker, valve  . Cancer Father   . Heart disease Sister        Fluid around the heart  . Breast cancer Neg Hx     Social History   Socioeconomic History  . Marital status: Divorced    Spouse name: Not on file  . Number of children: Not on file  . Years of education: Not on file  . Highest education level: Not on file  Occupational History  . Not on file  Tobacco Use  . Smoking status: Never Smoker  . Smokeless tobacco: Never Used  Substance and Sexual Activity  . Alcohol use: Yes    Comment: Socially. [name].  . Drug use: No  . Sexual activity: Not Currently  Other Topics Concern  . Not on file  Social History Narrative   Current Social History        Who lives at home: Lives with youngest daughter, "Olivia Mackie" 09/25/2016    Transportation: Public [id number]  Important Relationships & Pets: 2 daughters (52, 60), 1 son (34), 10 grandchildren, 4 great grandchildren 09/25/2016    Current Stressors: Recently moved from [place], [place] due to threat of NH placement 09/25/2016   Religious / Personal Beliefs: Christian 09/25/2016   Interests / Fun: Dancing 09/25/2016   Other: Participates in [employer] 09/25/2016   Social Determinants of Health   Financial Resource Strain:   . Difficulty of Paying Living Expenses:   Food Insecurity:   . Worried About Charity fundraiser in the Last Year:   . Arboriculturist in the Last Year:   Transportation Needs:   . Film/video editor (Medical):   Marland Kitchen Lack of Transportation (Non-Medical):   Physical Activity:   . Days of Exercise per Week:   . Minutes of Exercise per Session:   Stress:   . Feeling of Stress :   Social Connections:   . Frequency of Communication with Friends and Family:   . Frequency of  Social Gatherings with Friends and Family:   . Attends Religious Services:   . Active Member of Clubs or Organizations:   . Attends Archivist Meetings:   Marland Kitchen Marital Status:   Intimate Partner Violence:   . Fear of Current or Ex-Partner:   . Emotionally Abused:   Marland Kitchen Physically Abused:   . Sexually Abused:      Physical Exam   Vitals:   12/30/19 1200 12/30/19 1400  BP: (!) 126/98 (!) 172/138  Pulse:  81  Resp: 16 17  Temp:    SpO2:  92%    CONSTITUTIONAL: Chronically ill-appearing, NAD NEURO:  Alert and oriented x 3, no focal deficits EYES:  eyes equal and reactive ENT/NECK:  no LAD, no JVD CARDIO: Regular rate, well-perfused, normal S1 and S2 PULM:  CTAB no wheezing or rhonchi GI/GU:  normal bowel sounds, non-distended, non-tender MSK/SPINE:  No gross deformities, no edema, extremities are warm, left foot is tender to palpation SKIN:  no rash, atraumatic PSYCH:  Appropriate speech and behavior  *Additional and/or pertinent findings included in MDM below  Diagnostic and Interventional Summary    EKG Interpretation  Date/Time:  Tuesday Dec 30 2019 07:50:41 EDT Ventricular Rate:  76 PR Interval:    QRS Duration: 98 QT Interval:  385 QTC Calculation: 433 R Axis:   11 Text Interpretation: Sinus rhythm Paired ventricular premature complexes Low voltage, precordial leads No significant change was found Confirmed by Gerlene Fee [phone]) on 12/30/2019 8:05:56 AM      Labs Reviewed  CBC - Abnormal; Notable for the following components:      Result Value   RDW 16.0 (*)    All other components within normal limits  COMPREHENSIVE METABOLIC PANEL - Abnormal; Notable for the following components:   CO2 18 (*)    Glucose, Bld 62 (*)    BUN 31 (*)    Creatinine, Ser 2.43 (*)    Total Protein 8.2 (*)    GFR calc non Af Amer 19 (*)    GFR calc Af Amer 23 (*)    All other components within normal limits  URINALYSIS, ROUTINE W REFLEX MICROSCOPIC - Abnormal; Notable  for the following components:   APPearance HAZY (*)    All other components within normal limits  CBG MONITORING, ED - Abnormal; Notable for the following components:   Glucose-Capillary 47 (*)    All other components within normal limits  CBG MONITORING, ED - Abnormal; Notable for the following components:  Glucose-Capillary 58 (*)    All other components within normal limits  RESPIRATORY PANEL BY RT PCR (FLU A&B, COVID)  BRAIN NATRIURETIC PEPTIDE  PROTIME-INR  APTT  LACTIC ACID, PLASMA  BASIC METABOLIC PANEL  CBG MONITORING, ED    CT HEAD WO CONTRAST  Final Result    DG Foot Complete Left  Final Result    DG Ankle Complete Left  Final Result    DG Pelvis 1-2 Views  Final Result    DG Lumbar Spine Complete  Final Result    DG Chest 1 View  Final Result      Medications  lactated ringers bolus 1,000 mL (has no administration in time range)  albuterol (PROVENTIL) (2.5 MG/3ML) 0.083% nebulizer solution 2.5 mg (has no administration in time range)  aspirin EC tablet 81 mg (81 mg Oral Given 12/30/19 1231)  atorvastatin (LIPITOR) tablet 80 mg (has no administration in time range)  mometasone-formoterol (DULERA) 100-5 MCG/ACT inhaler 2 puff (0 puffs Inhalation Hold 12/30/19 1234)  clopidogrel (PLAVIX) tablet 75 mg (75 mg Oral Given 12/30/19 1231)  DULoxetine (CYMBALTA) DR capsule 60 mg (60 mg Oral Given 12/30/19 1230)  gabapentin (NEURONTIN) capsule 300 mg (300 mg Oral Given 12/30/19 1233)  loratadine (CLARITIN) tablet 10 mg (10 mg Oral Given 12/30/19 1231)  pantoprazole (PROTONIX) EC tablet 40 mg (40 mg Oral Given 12/30/19 1231)  topiramate (TOPAMAX) tablet 50 mg (has no administration in time range)  enoxaparin (LOVENOX) injection 30 mg (has no administration in time range)  acetaminophen (TYLENOL) tablet 650 mg (has no administration in time range)    Or  acetaminophen (TYLENOL) suppository 650 mg (has no administration in time range)  oxyCODONE (Oxy IR/ROXICODONE) immediate  release tablet 5 mg (has no administration in time range)  polyethylene glycol (MIRALAX / GLYCOLAX) packet 17 g (has no administration in time range)  ondansetron (ZOFRAN) tablet 4 mg (has no administration in time range)    Or  ondansetron (ZOFRAN) injection 4 mg (has no administration in time range)     Procedures  /  Critical Care Ultrasound ED Peripheral IV (Provider)  Date/Time: 12/30/2019 8:40 AM Performed by: Maudie Flakes, MD Authorized by: Maudie Flakes, MD   Procedure details:    Indications: multiple failed IV attempts     Skin Prep: chlorhexidine gluconate     Location:  Right AC   Angiocath:  20 G   Bedside Ultrasound Guided: Yes     Patient tolerated procedure without complications: Yes     Dressing applied: Yes      ED Course and Medical Decision Making  I have reviewed the triage vital signs, the nursing notes, and pertinent available records from the EMR.  Listed above are laboratory and imaging tests that I personally ordered, reviewed, and interpreted and then considered in my medical decision making (see below for details).      Recent admission for AKI, acute left frontal infarct, urinary retention, altered mental status, here with increasing confusion, poor urinary output, left foot pain.  Will call family to obtain further information.  Considering hemorrhagic conversion of stroke, metabolic disarray, UTI, urinary retention, work-up pending.  Spoke with daughter who adds that patient had multiple falls 2 days ago and this may have been when she hurt her foot.  Will add on more x-ray imaging.  Labs reveal AKI, admitted to internal medicine for further care.  Barth Kirks. Sedonia Small, [place] [email or web address]  Final Clinical Impressions(s) /  ED Diagnoses     ICD-10-CM   1. Urinary retention  R33.9   2. Fall  W19.Merril Abbe DG Chest 1 View    DG Chest 1 View  3. AKI (acute kidney injury) ([place])  N17.9       ED Discharge Orders    None       Discharge Instructions Discussed with and Provided to Patient:   Discharge Instructions   None       Maudie Flakes, MD 12/30/19 1527

## 2019-12-31 ENCOUNTER — Encounter (HOSPITAL_COMMUNITY): Payer: Self-pay | Admitting: Internal Medicine

## 2019-12-31 ENCOUNTER — Observation Stay (HOSPITAL_COMMUNITY): Payer: Medicare Other

## 2019-12-31 DIAGNOSIS — K219 Gastro-esophageal reflux disease without esophagitis: Secondary | ICD-10-CM | POA: Diagnosis present

## 2019-12-31 DIAGNOSIS — E11649 Type 2 diabetes mellitus with hypoglycemia without coma: Secondary | ICD-10-CM | POA: Diagnosis present

## 2019-12-31 DIAGNOSIS — E785 Hyperlipidemia, unspecified: Secondary | ICD-10-CM | POA: Diagnosis present

## 2019-12-31 DIAGNOSIS — N179 Acute kidney failure, unspecified: Secondary | ICD-10-CM | POA: Diagnosis present

## 2019-12-31 DIAGNOSIS — M109 Gout, unspecified: Secondary | ICD-10-CM | POA: Diagnosis present

## 2019-12-31 DIAGNOSIS — E1151 Type 2 diabetes mellitus with diabetic peripheral angiopathy without gangrene: Secondary | ICD-10-CM | POA: Diagnosis present

## 2019-12-31 DIAGNOSIS — M19072 Primary osteoarthritis, left ankle and foot: Secondary | ICD-10-CM | POA: Diagnosis present

## 2019-12-31 DIAGNOSIS — M79672 Pain in left foot: Secondary | ICD-10-CM

## 2019-12-31 DIAGNOSIS — E861 Hypovolemia: Secondary | ICD-10-CM | POA: Diagnosis present

## 2019-12-31 DIAGNOSIS — L03221 Cellulitis of neck: Secondary | ICD-10-CM | POA: Diagnosis not present

## 2019-12-31 DIAGNOSIS — W19XXXA Unspecified fall, initial encounter: Secondary | ICD-10-CM | POA: Diagnosis not present

## 2019-12-31 DIAGNOSIS — R0603 Acute respiratory distress: Secondary | ICD-10-CM | POA: Diagnosis not present

## 2019-12-31 DIAGNOSIS — I129 Hypertensive chronic kidney disease with stage 1 through stage 4 chronic kidney disease, or unspecified chronic kidney disease: Secondary | ICD-10-CM | POA: Diagnosis present

## 2019-12-31 DIAGNOSIS — N189 Chronic kidney disease, unspecified: Secondary | ICD-10-CM | POA: Diagnosis present

## 2019-12-31 DIAGNOSIS — G9341 Metabolic encephalopathy: Secondary | ICD-10-CM | POA: Diagnosis present

## 2019-12-31 DIAGNOSIS — E1122 Type 2 diabetes mellitus with diabetic chronic kidney disease: Secondary | ICD-10-CM | POA: Diagnosis present

## 2019-12-31 DIAGNOSIS — R339 Retention of urine, unspecified: Secondary | ICD-10-CM

## 2019-12-31 DIAGNOSIS — I872 Venous insufficiency (chronic) (peripheral): Secondary | ICD-10-CM | POA: Diagnosis present

## 2019-12-31 DIAGNOSIS — K1120 Sialoadenitis, unspecified: Secondary | ICD-10-CM | POA: Diagnosis not present

## 2019-12-31 DIAGNOSIS — R609 Edema, unspecified: Secondary | ICD-10-CM | POA: Diagnosis present

## 2019-12-31 DIAGNOSIS — R262 Difficulty in walking, not elsewhere classified: Secondary | ICD-10-CM | POA: Diagnosis present

## 2019-12-31 DIAGNOSIS — Z6841 Body Mass Index (BMI) 40.0 and over, adult: Secondary | ICD-10-CM | POA: Diagnosis not present

## 2019-12-31 DIAGNOSIS — Z20822 Contact with and (suspected) exposure to covid-19: Secondary | ICD-10-CM | POA: Diagnosis present

## 2019-12-31 DIAGNOSIS — I2510 Atherosclerotic heart disease of native coronary artery without angina pectoris: Secondary | ICD-10-CM | POA: Diagnosis present

## 2019-12-31 DIAGNOSIS — F319 Bipolar disorder, unspecified: Secondary | ICD-10-CM | POA: Diagnosis present

## 2019-12-31 LAB — BASIC METABOLIC PANEL
Anion gap: 5 (ref 5–15)
BUN: 22 mg/dL (ref 8–23)
CO2: 24 mmol/L (ref 22–32)
Calcium: 9.3 mg/dL (ref 8.9–10.3)
Chloride: 116 mmol/L — ABNORMAL HIGH (ref 98–111)
Creatinine, Ser: 1.6 mg/dL — ABNORMAL HIGH (ref 0.44–1.00)
GFR calc Af Amer: 37 mL/min — ABNORMAL LOW (ref >60)
GFR calc non Af Amer: 32 mL/min — ABNORMAL LOW (ref >60)
Glucose, Bld: 145 mg/dL — ABNORMAL HIGH (ref 70–99)
Potassium: 4.9 mmol/L (ref 3.5–5.1)
Sodium: 145 mmol/L (ref 135–145)

## 2019-12-31 LAB — GLUCOSE, CAPILLARY
Glucose-Capillary: 103 mg/dL — ABNORMAL HIGH (ref 70–99)
Glucose-Capillary: 111 mg/dL — ABNORMAL HIGH (ref 70–99)
Glucose-Capillary: 112 mg/dL — ABNORMAL HIGH (ref 70–99)
Glucose-Capillary: 117 mg/dL — ABNORMAL HIGH (ref 70–99)
Glucose-Capillary: 62 mg/dL — ABNORMAL LOW (ref 70–99)
Glucose-Capillary: 64 mg/dL — ABNORMAL LOW (ref 70–99)
Glucose-Capillary: 73 mg/dL (ref 70–99)
Glucose-Capillary: 87 mg/dL (ref 70–99)

## 2019-12-31 MED ORDER — POLYETHYLENE GLYCOL 3350 17 G PO PACK
17.0000 g | PACK | Freq: Every day | ORAL | Status: DC
  Administered 2019-12-31: 17 g via ORAL
  Filled 2019-12-31 (×2): qty 1

## 2019-12-31 MED ORDER — CHLORHEXIDINE GLUCONATE CLOTH 2 % EX PADS
6.0000 | MEDICATED_PAD | Freq: Every day | CUTANEOUS | Status: DC
  Administered 2019-12-31 – 2020-01-05 (×6): 6 via TOPICAL

## 2019-12-31 MED ORDER — LACTATED RINGERS IV SOLN: INTRAVENOUS | Status: AC

## 2019-12-31 NOTE — Progress Notes (Signed)
PT Cancellation Note  Patient Details Name: Alisha [name] MRN: [id number] DOB: 1950/11/16   Cancelled Treatment:    Reason Eval/Treat Not Completed: Patient at procedure or test/unavailable Will follow up as schedule allows.   Lou Miner, DPT  Acute Rehabilitation Services  Pager: [phone] Office: [phone]    [name] 12/31/2019, 2:33 PM
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
## 2019-12-31 NOTE — TOC Initial Note (Signed)
Transition of Care [facility]) - Initial/Assessment Note    Patient Details  Name: Alisha [name] MRN: [id number] Date of Birth: 08-Feb-1951  Transition of [facility]) CM/SW Contact:    Marilu Favre, RN Phone Number: 12/31/2019, 12:01 PM  Clinical Narrative:                  Patient discharged from [facility] on 5/3 to her daughters home [address] , [place]. Address in Epic is patient's address.   Patient was set up with [facility] for home health , however, was not seen prior to admission. Discharged from [facility] on 5/3 and admitted to hospital 5/4.  Patient has walker, Rollator, and bedside commode at home.   Will await PT eval and recommendations. Patient is willing to go to a SNF if recommended however she  does not want to go back to [facility]. Patient understands would need insurance authorization.  Left Tommi Rumps with Alvis Lemmings a message.   NCM will follow up with patient after PT eval. Barriers to Discharge: Continued Medical Work up   Patient Goals and CMS Choice Patient states their goals for this hospitalization and ongoing recovery are:: to get stronger CMS Medicare.gov Compare Post Acute Care list provided to:: Patient Choice offered to / list presented to : Patient  Expected Discharge Plan and Services     Discharge Planning Services: CM Consult                                          Prior Living Arrangements/Services   Lives with:: Adult Children Patient language and need for interpreter reviewed:: Yes Do you feel safe going back to the place where you live?: Yes      Need for Family Participation in Patient Care: Yes (Comment) Care giver support system in place?: Yes (comment) Current home services: DME Criminal Activity/Legal Involvement Pertinent to Current Situation/Hospitalization: No - Comment as needed  Activities of Daily Living Home Assistive Devices/Equipment: Cane (specify quad or straight), Walker  (specify type), Eyeglasses ADL Screening (condition at time of admission) Patient's cognitive ability adequate to safely complete daily activities?: Yes Is the patient deaf or have difficulty hearing?: No Does the patient have difficulty seeing, even when wearing glasses/contacts?: No Does the patient have difficulty concentrating, remembering, or making decisions?: Yes Patient able to express need for assistance with ADLs?: Yes Does the patient have difficulty dressing or bathing?: No Independently performs ADLs?: Yes (appropriate for developmental age) Does the patient have difficulty walking or climbing stairs?: Yes Weakness of Legs: Both Weakness of Arms/Hands: None  Permission Sought/Granted   Permission granted to share information with : No              Emotional Assessment Appearance:: Appears stated age Attitude/Demeanor/Rapport: Engaged Affect (typically observed): Accepting Orientation: : Oriented to Self, Oriented to Place, Oriented to  Time, Oriented to Situation Alcohol / Substance Use: Not Applicable Psych Involvement: No (comment)  Admission diagnosis:  Urinary retention [R33.9] Fall [W19.XXXA] AKI (acute kidney injury) ([place]) [N17.9] Patient Active Problem List   Diagnosis Date Noted  . Cerebrovascular accident (CVA) ([place])   . Altered mental status   . Fall   . AKI (acute kidney injury) ([place]) 11/03/2019  . Recurrent falls 09/22/2018  . Chronic use of opiate drug for therapeutic purpose 04/11/2018  . Bradycardia 04/11/2018  . Chronic fatigue 04/11/2018  . Prediabetes 12/17/2017  .  Mild cognitive impairment 12/13/2017  . Cough variant asthma vs UACS 10/26/2017  . Upper airway cough syndrome 10/25/2017  . Medial epicondylitis, right 09/16/2017  . Right shoulder pain 09/07/2017  . Lateral epicondylitis of right elbow 09/07/2017  . Vitamin D deficiency 07/13/2017  . History of foot fracture 07/12/2017  . History of colon polyps 07/12/2017  . Cataract of  both eyes 07/11/2017  . Hypertensive retinopathy of both eyes 07/11/2017  . Fibromyalgia 05/17/2017  . Tenosynovitis, wrist 05/14/2017  . Chronic cough 05/02/2017  . Foot pain, left 05/02/2017  . Dysphagia 03/23/2017  . Health care maintenance 03/23/2017  . Coronary artery disease involving native coronary artery of native heart without angina pectoris 01/11/2017  . Hyperlipidemia 01/11/2017  . Polypharmacy 11/25/2016  . Venous stasis of both lower extremities 10/04/2016  . Poor dentition 10/04/2016  . OSA (obstructive sleep apnea) 10/04/2016  . History of nephrolithiasis 10/04/2016  . Hx of pilonidal cyst 10/04/2016  . Bipolar depression ([place]) 10/04/2016  . Personal history of DVT (deep vein thrombosis) 10/04/2016  . Morbid obesity due to excess calories ([place]) 09/25/2016  . History of pituitary adenoma s/p resection 09/25/2016  . Essential hypertension 09/25/2016  . Psoriatic arthritis ([place]) 09/25/2016  . Vision impairment 09/25/2016  . Chronic seasonal allergic rhinitis due to pollen 09/25/2016  . Moderate persistent asthma without complication [id number]  . Gastroesophageal reflux disease without esophagitis 09/25/2016  . Urge incontinence 09/25/2016  . Chronic pain syndrome 09/25/2016  . Candidal intertrigo 05/10/2010   PCP:  Sonia Side., FNP Pharmacy:   [id number] - [address] [place] [address] Phone: [phone] Fax: [phone]     Social Determinants of Health ([place]) Interventions    Readmission Risk Interventions Readmission Risk Prevention Plan 12/08/2019  Transportation Screening Complete  PCP or Specialist Appt within 5-7 Days Not Complete  Not Complete comments plan for SNF  Home Care Screening Complete  Medication Review (RN CM) Referral to Pharmacy  Some recent data might be hidden

## 2019-12-31 NOTE — Progress Notes (Signed)
Called the pt daughter to give an update. Pt has apparently had confusion over the last few months and it has gotten worse. She has been increasingly forgetful and disoriented. Her confusion seems to wax and wane. She has also had more frequent falls as well. She states she fell twice at the facility. The first time she slid off the bed onto the floor, the second time she fell in the bathroom, and when they picked her up it twisted her L foot. We discussed how we are getting a scan of her foot and monitoring her kidney function. She voiced understanding.  Earlene Plater, MD Internal Medicine, PGY1 Pager: [phone]  12/31/2019,1:33 PM

## 2019-12-31 NOTE — Progress Notes (Signed)
Subjective:   Pt was seen at the bedside today. Patient reiterates that her ankle did not hurt the day that her foot was tangled. The next day when she went home she noticed some pain when sitting in her armchair. She states that she can move her foot, but not her ankle. She states that it causes great pain on the outside of the ankle when she moves her ankle. She had her ankle surgery years ago, and her ankle did have some pain after her procedure. She was able to walk with the boot given to her after the surgery.   Objective: CBC Latest Ref Rng & Units 12/30/2019 12/10/2019 12/09/2019  WBC 4.0 - 10.5 K/uL 7.8 7.7 6.7  Hemoglobin 12.0 - 15.0 g/dL 12.0 11.6(L) 10.8(L)  Hematocrit 36.0 - 46.0 % 38.2 36.0 33.7(L)  Platelets 150 - 400 K/uL 275 217 206   BMP Latest Ref Rng & Units 12/30/2019 12/30/2019 12/10/2019  Glucose 70 - 99 mg/dL 84 62(L) 76  BUN 8 - 23 mg/dL 26(H) 31(H) 6(L)  Creatinine 0.44 - 1.00 mg/dL 1.79(H) 2.43(H) 1.02(H)  BUN/Creat Ratio 12 - 28 - - -  Sodium 135 - 145 mmol/L 143 142 144  Potassium 3.5 - 5.1 mmol/L 4.4 4.1 3.5  Chloride 98 - 111 mmol/L 112(H) 110 115(H)  CO2 22 - 32 mmol/L 23 18(L) 22  Calcium 8.9 - 10.3 mg/dL 9.3 9.6 8.9   Vital signs in last 24 hours: Vitals:   12/30/19 1823 12/30/19 2010 12/31/19 0115 12/31/19 0555  BP: (!) 149/72  (!) 151/79 (!) 149/73  Pulse: 69  74 85  Resp: 16  17 16   Temp: 98 F (36.7 C)  98.2 F (36.8 C) 98.6 F (37 C)  TempSrc: Oral  Oral Oral  SpO2: 100% 100% 100% 91%  Weight:       Physical Exam General: Laying in bed, NAD HEENT: NCAT CV: RRR, normal S1 and S2 no murmurs rubs or gallops appreciated PULM: Clear to auscultation bilateral ABD: Soft nontender no quad NEURO: Alert and oriented, nonfocal MSK: Exquisite tenderness to lateral aspect of the L ankle  Assessment/Plan:  Active Problems:   AKI (acute kidney injury) ([place])  In summary, Alisha [name] is a 69 y.o female with fibromyalgia, bipolar, PAD, CAD, chronic  venous insufficiency, and HTN who presented to the ED via EMS with acute onset left foot pain and AKI.     #AKI: Creatinine was elevated to 2.43 on admission. downtrended to 1.79>>1.6 this AM with IVF.  This is suspected to be due to poor p.o. intake and increased NSAID use for pain. -LR 100/cc hr for 10 hrs -Strict I/Os -Hold anticholinergic medications -Avoid nephrotoxic medications like diuretics -Daily BMP   #L foot pain: Patient was exquisitely tender to palpation of her left ankle.  X-rays menstruated hardware from previous ankle fusion surgery.  Fortunately, we do not have records of when the surgery took place what type of hardware was placed.  Given the negative x-rays for fracture, I am concerned for soft tissue injuries.  It is difficult to determine, because the patient will barely let me examine her ankle due to pain.  Gust imaging modalities with radiology and we appreciate the recommendation. -MRI L ankle without contrast ordered -Oxycodone IR 5 mg q12hrs PRN -Tylenol 650 mg q6hrs PRN  #PAD #CAD #HTN -Aspirin 81 mg daily -Plavix 75 mg daily -Atorvastatin 80 mg daily   #Chronic venous insufficiency -Continue wraps and elevation   #T2DM: Pt takes  metformin at home -hold home metformin  -SSI  #FEN/GI -Diet: heart healthy -Fluids: LR 100 cc/hr for 10 hrs -Protonix 40 mg daily -Zofran 4 mg every 6 hours as needed -MiraLAX daily   #DVT prophylaxis -Lovenox 40 mg subq injections daily  #CODE STATUS: FULL  #Dispo: Anticipated discharge in approximately 2-3 days Prior to Admission Living Arrangement: Home Anticipated Discharge Location: Home or SNF Barriers to Discharge: Ongoing medical workup, PT/OT evaluation  Earlene Plater, MD Internal Medicine, PGY1 Pager: [phone]  12/31/2019,6:23 AM
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
## 2019-12-31 NOTE — Progress Notes (Signed)
OT Cancellation Note  Patient Details Name: Alisha [name] MRN: [id number] DOB: 1951-04-21   Cancelled Treatment:    Reason Eval/Treat Not Completed: Patient at procedure or test/ unavailable (MRI), will follow up for OT eval as able.  Lou Cal, OT Acute Rehabilitation Services Pager [phone] Office [phone]   Raymondo Band 12/31/2019, 2:34 PM

## 2019-12-31 NOTE — Evaluation (Signed)
Speech Language Pathology Evaluation Patient Details Name: Alisha [name] MRN: [id number] DOB: May 19, 1951 Today's Date: 12/31/2019 Time: [id number] SLP Time Calculation (min) (ACUTE ONLY): 22 min  Problem List:  Patient Active Problem List   Diagnosis Date Noted  . Urinary retention   . Cerebrovascular accident (CVA) ([place])   . Altered mental status   . Fall   . AKI (acute kidney injury) ([place]) 11/03/2019  . Recurrent falls 09/22/2018  . Chronic use of opiate drug for therapeutic purpose 04/11/2018  . Bradycardia 04/11/2018  . Chronic fatigue 04/11/2018  . Prediabetes 12/17/2017  . Mild cognitive impairment 12/13/2017  . Cough variant asthma vs UACS 10/26/2017  . Upper airway cough syndrome 10/25/2017  . Medial epicondylitis, right 09/16/2017  . Right shoulder pain 09/07/2017  . Lateral epicondylitis of right elbow 09/07/2017  . Vitamin D deficiency 07/13/2017  . History of foot fracture 07/12/2017  . History of colon polyps 07/12/2017  . Cataract of both eyes 07/11/2017  . Hypertensive retinopathy of both eyes 07/11/2017  . Fibromyalgia 05/17/2017  . Tenosynovitis, wrist 05/14/2017  . Chronic cough 05/02/2017  . Foot pain, left 05/02/2017  . Dysphagia 03/23/2017  . Health care maintenance 03/23/2017  . Coronary artery disease involving native coronary artery of native heart without angina pectoris 01/11/2017  . Hyperlipidemia 01/11/2017  . Polypharmacy 11/25/2016  . Venous stasis of both lower extremities 10/04/2016  . Poor dentition 10/04/2016  . OSA (obstructive sleep apnea) 10/04/2016  . History of nephrolithiasis 10/04/2016  . Hx of pilonidal cyst 10/04/2016  . Bipolar depression ([place]) 10/04/2016  . Personal history of DVT (deep vein thrombosis) 10/04/2016  . Morbid obesity due to excess calories ([place]) 09/25/2016  . History of pituitary adenoma s/p resection 09/25/2016  . Essential hypertension 09/25/2016  . Psoriatic arthritis ([place]) 09/25/2016  .  Vision impairment 09/25/2016  . Chronic seasonal allergic rhinitis due to pollen 09/25/2016  . Moderate persistent asthma without complication [id number]  . Gastroesophageal reflux disease without esophagitis 09/25/2016  . Urge incontinence 09/25/2016  . Chronic pain syndrome 09/25/2016  . Candidal intertrigo 05/10/2010   Past Medical History:  Past Medical History:  Diagnosis Date  . AKI (acute kidney injury) ([place])    12/31/2019  . Arthritis   . Asthma   . Bipolar disorder ([place])   . CAD (coronary artery disease) 07/07/2016   a. s/p stenting x 2 in [place] to the LAD, RCA is chronically occluded, LVEDP was 34  . Cancer ([facility])    cervical  . Depression   . Fibromyalgia   . GERD (gastroesophageal reflux disease)   . History of benign pituitary tumor   . History of kidney stones    1972  . Hyperlipidemia   . Hypertension   . Migraine   . Morbid obesity ([place])   . Peripheral vascular disease ([place])   . Pneumonia   . Seizures ([place])    during surgery for pituitary tumor  . Sleep apnea    with cpap  . Vertigo    Past Surgical History:  Past Surgical History:  Procedure Laterality Date  . ANKLE FUSION Bilateral   . carpel tunnel release Bilateral 2017  . CORONARY ANGIOPLASTY    . ESOPHAGOGASTRODUODENOSCOPY (EGD) WITH PROPOFOL N/A 06/14/2017   Procedure: ESOPHAGOGASTRODUODENOSCOPY (EGD) WITH PROPOFOL;  Surgeon: Wilford Corner, MD;  Location: [facility];  Service: Endoscopy;  Laterality: N/A;  . EXCISION / CURETTAGE BONE CYST PHALANGES OF FOOT  2014   Removal of foot cyst   . PARTIAL HYSTERECTOMY  unknown   Patient still has ovaries  . pituitary tumor removal  1999   removed as much as they could, on optic nerve  . TONSILLECTOMY    . TOTAL KNEE ARTHROPLASTY     TKR X 3  2 on the left and 1 on the right   HPI:  Pt is a 69 y.o female with fibromyalgia, bipolar, PAD, CAD, chronic venous insufficiency, and HTN who presented to the ED via EMS with left foot pain and swelling.  CT of the head negative for acute changes. small amount of residual tumor within the left cavernous sinus. chronic small vessel ischemic changes within the cerebral white matter. 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   Assessment / Plan / Recommendation Clinical Impression  Pt participated in speech/language/cognition evaluation. She is known to speech pathology and participated in an evaluation on 12/07/19 which revealed dysarthria as well as impairments in working memory, safety awareness, reasoning, complex problem solving and completion of complex/multistep commands. Pt reported today that she had been receiving SLP services following the last admission and she was able to state various tasks which were completed in therapy as well as compensatory strategies for memory which she has found to be helpful. She denied any acute exacerbation of baseline cognitive deficits and stated that her cognition is improved from the last evaluation.   The [facility] Mental Status Examination was completed to evaluate the pt's cognitive-linguistic skills. She achieved a score of 15/30 which is below the normal limits of 27 or more out of 30. She exhibited difficulty in the areas of awareness, attention, memory, executive function, and complex problem solving. However, her performance was qualitatively improved compared to April. Moderate dysarthria was also noted but speech was intelligible with a known context. Acute skilled SLP services are not clinically indicated at this time since pt appears to be at baseline. However, it is recommended that SLP services be continued following discharge at next venue of care.     SLP Assessment  SLP Recommendation/Assessment: All further Speech Lanaguage Pathology  needs can be addressed in the next venue of care SLP Visit Diagnosis: Cognitive communication deficit (R41.841)    Follow Up Recommendations  Skilled  Nursing facility    Frequency and Duration           SLP Evaluation Cognition  Overall Cognitive Status: Impaired/Different from baseline Arousal/Alertness: Awake/alert Orientation Level: Oriented to person;Oriented to place;Oriented to situation;Disoriented to time Attention: Focused;Sustained Memory: Impaired Memory Impairment: Storage deficit;Retrieval deficit;Decreased recall of new information(Immediate: 3/5; Delayed: 4/5; with cue: 1/1) Awareness: Impaired Awareness Impairment: Anticipatory impairment Problem Solving: Impaired Problem Solving Impairment: Verbal complex Executive Function: Reasoning;Sequencing;Organizing Reasoning: Impaired Reasoning Impairment: Verbal complex Decision Making: Appears intact       Comprehension  Auditory Comprehension Overall Auditory Comprehension: Impaired at baseline Yes/No Questions: Within Functional Limits Paragraph Comprehension (via yes/no questions): (2/8) Commands: Within Functional Limits Conversation: Complex Interfering Components: Working Financial planner: Not tested Reading Comprehension Reading Status: Not tested    Expression Expression Primary Mode of Expression: Verbal Verbal Expression Overall Verbal Expression: Impaired at baseline Initiation: No impairment Level of Generative/Spontaneous Verbalization: Conversation Repetition: No impairment Naming: Impairment Responsive: Not tested Confrontation: Within functional limits Convergent: Not tested Divergent: (0/1) Pragmatics: No impairment Written Expression Dominant Hand: Right Written Expression: (Difficulty drawing clock:  0/4)   Oral / Motor  Oral Motor/Sensory Function Overall Oral Motor/Sensory Function: Within functional limits Motor Speech Overall Motor Speech:  Impaired Respiration: Within functional limits Phonation: Normal Resonance: Within functional limits Articulation:  Impaired Level of Impairment: Sentence Intelligibility: Intelligibility reduced Word: 75-100% accurate Phrase: 75-100% accurate Sentence: 75-100% accurate Conversation: 75-100% accurate Motor Planning: Witnin functional limits Motor Speech Errors: Aware;Consistent   Eldwin Volkov I. Hardin Negus, [place], [place] Office number [phone] Pager [place] 12/31/2019, 5:24 PM

## 2020-01-01 ENCOUNTER — Inpatient Hospital Stay (HOSPITAL_COMMUNITY): Payer: Medicare Other

## 2020-01-01 DIAGNOSIS — K1120 Sialoadenitis, unspecified: Secondary | ICD-10-CM

## 2020-01-01 DIAGNOSIS — N179 Acute kidney failure, unspecified: Principal | ICD-10-CM

## 2020-01-01 LAB — GLUCOSE, CAPILLARY
Glucose-Capillary: 107 mg/dL — ABNORMAL HIGH (ref 70–99)
Glucose-Capillary: 109 mg/dL — ABNORMAL HIGH (ref 70–99)
Glucose-Capillary: 110 mg/dL — ABNORMAL HIGH (ref 70–99)
Glucose-Capillary: 119 mg/dL — ABNORMAL HIGH (ref 70–99)
Glucose-Capillary: 135 mg/dL — ABNORMAL HIGH (ref 70–99)
Glucose-Capillary: 82 mg/dL (ref 70–99)
Glucose-Capillary: 94 mg/dL (ref 70–99)

## 2020-01-01 LAB — BASIC METABOLIC PANEL
Anion gap: 13 (ref 5–15)
BUN: 15 mg/dL (ref 8–23)
CO2: 22 mmol/L (ref 22–32)
Calcium: 9 mg/dL (ref 8.9–10.3)
Chloride: 114 mmol/L — ABNORMAL HIGH (ref 98–111)
Creatinine, Ser: 1.48 mg/dL — ABNORMAL HIGH (ref 0.44–1.00)
GFR calc Af Amer: 41 mL/min — ABNORMAL LOW (ref >60)
GFR calc non Af Amer: 36 mL/min — ABNORMAL LOW (ref >60)
Glucose, Bld: 126 mg/dL — ABNORMAL HIGH (ref 70–99)
Potassium: 4 mmol/L (ref 3.5–5.1)
Sodium: 149 mmol/L — ABNORMAL HIGH (ref 135–145)

## 2020-01-01 LAB — CBC
HCT: 34.9 % — ABNORMAL LOW (ref 36.0–46.0)
Hemoglobin: 11.4 g/dL — ABNORMAL LOW (ref 12.0–15.0)
MCH: 29.3 pg (ref 26.0–34.0)
MCHC: 32.7 g/dL (ref 30.0–36.0)
MCV: 89.7 fL (ref 80.0–100.0)
Platelets: 217 10*3/uL (ref 150–400)
RBC: 3.89 MIL/uL (ref 3.87–5.11)
RDW: 15.9 % — ABNORMAL HIGH (ref 11.5–15.5)
WBC: 15.4 10*3/uL — ABNORMAL HIGH (ref 4.0–10.5)
nRBC: 0 % (ref 0.0–0.2)

## 2020-01-01 LAB — LACTIC ACID, PLASMA
Lactic Acid, Venous: 1.1 mmol/L (ref 0.5–1.9)
Lactic Acid, Venous: 0.9 mmol/L (ref 0.5–1.9)

## 2020-01-01 MED ORDER — LACTATED RINGERS IV BOLUS
1000.0000 mL | Freq: Once | INTRAVENOUS | Status: AC
  Administered 2020-01-01: 09:00:00 1000 mL via INTRAVENOUS

## 2020-01-01 MED ORDER — DEXAMETHASONE SODIUM PHOSPHATE 10 MG/ML IJ SOLN
10.0000 mg | Freq: Once | INTRAMUSCULAR | Status: AC
  Administered 2020-01-01: 10:00:00 10 mg via INTRAVENOUS
  Filled 2020-01-01: qty 1

## 2020-01-01 MED ORDER — CLINDAMYCIN PHOSPHATE 900 MG/50ML IV SOLN
900.0000 mg | Freq: Once | INTRAVENOUS | Status: DC
  Filled 2020-01-01: qty 50

## 2020-01-01 MED ORDER — LACTATED RINGERS IV SOLN
INTRAVENOUS | Status: DC
  Administered 2020-01-05: 100 mL/h via INTRAVENOUS

## 2020-01-01 MED ORDER — ACETAMINOPHEN 650 MG RE SUPP
650.0000 mg | Freq: Four times a day (QID) | RECTAL | Status: DC
  Administered 2020-01-01 – 2020-01-02 (×2): 650 mg via RECTAL
  Filled 2020-01-01 (×5): qty 1

## 2020-01-01 MED ORDER — HYDROMORPHONE HCL 1 MG/ML IJ SOLN
0.5000 mg | INTRAMUSCULAR | Status: DC | PRN
  Administered 2020-01-01: 0.5 mg via INTRAVENOUS
  Filled 2020-01-01: qty 1

## 2020-01-01 MED ORDER — ACETAMINOPHEN 325 MG PO TABS
650.0000 mg | ORAL_TABLET | Freq: Four times a day (QID) | ORAL | Status: DC
  Administered 2020-01-02 – 2020-01-05 (×13): 650 mg via ORAL
  Filled 2020-01-01 (×12): qty 2

## 2020-01-01 MED ORDER — SODIUM CHLORIDE 0.9 % IV SOLN
3.0000 g | Freq: Three times a day (TID) | INTRAVENOUS | Status: DC
  Administered 2020-01-01 – 2020-01-05 (×13): 3 g via INTRAVENOUS
  Filled 2020-01-01: qty 8
  Filled 2020-01-01 (×4): qty 3
  Filled 2020-01-01 (×2): qty 8
  Filled 2020-01-01: qty 3
  Filled 2020-01-01 (×2): qty 8
  Filled 2020-01-01: qty 3
  Filled 2020-01-01 (×2): qty 8
  Filled 2020-01-01: qty 3
  Filled 2020-01-01: qty 8

## 2020-01-01 MED ORDER — IOHEXOL 300 MG/ML  SOLN
60.0000 mL | Freq: Once | INTRAMUSCULAR | Status: AC | PRN
  Administered 2020-01-01: 60 mL via INTRAVENOUS

## 2020-01-01 MED ORDER — OXYCODONE HCL 5 MG PO TABS: 5.0000 mg | ORAL_TABLET | Freq: Four times a day (QID) | ORAL | Status: DC | PRN

## 2020-01-01 NOTE — Consult Note (Signed)
[facility]  Total days of antibiotics 1               Reason for Consult: possible submandibular abscess    Referring Physician: guilloud  Active Problems:   AKI (acute kidney injury) ([place])   Urinary retention   Submandibular gland infection    HPI: Alisha [name] is a 69 y.o. female with hx of CAD, HTN, Asthma, hx of cervical ca, who was admitted initially on 5/4 for acute onset of left foot pain but found to have aki due to nsaid use and decreased po intake. Over the course of the last 24-36hr started to develop painful swelling to left side of neck with difficulty swallowing and phonation. She states that right side of her neck is exquisitely tender. No fevers. There was concern for retropharyngeal abscess, thus abtx and imaging quickly obtained. Her imaging shows enlargement of right submandibular gland with surrounding soft tissue edema. No obstructive stone or abscess visualized. ENT has seen patient and recommended iv abtx plus steroids to help with swelling plus agents to increase salivation. The patient was given small dose of iv dilaudid of 0.5mg  this morning and sedated most of the day. Patient easily awakens but falls back to sleep quickly. hpi obtained through chart review. She states that she is not having significant pain.  Past Medical History:  Diagnosis Date  . AKI (acute kidney injury) ([place])    12/31/2019  . Arthritis   . Asthma   . Bipolar disorder ([place])   . CAD (coronary artery disease) 07/07/2016   a. s/p stenting x 2 in [place] to the LAD, RCA is chronically occluded, LVEDP was 34  . Cancer (H[facility])    cervical  . Depression   . Fibromyalgia   . GERD (gastroesophageal reflux disease)   . History of benign pituitary tumor   . History of kidney stones    1972  . Hyperlipidemia   . Hypertension   . Migraine   . Morbid obesity ([place])   . Peripheral vascular disease ([place])   . Pneumonia   . Seizures ([place])    during surgery  for pituitary tumor  . Sleep apnea    with cpap  . Vertigo     Allergies:  Allergies  Allergen Reactions  . Ibuprofen     Other reaction(s): Upset Stomach  . Ampicillin Nausea Only  . Asa [Aspirin] Diarrhea    Other reaction(s): Upset Stomach  . Darvon [Propoxyphene] Nausea And Vomiting  . Demeclocycline Other (See Comments)    Nerves feeling  . Eggs Or Egg-Derived Products Diarrhea  . Lactalbumin Diarrhea  . Lisinopril Cough  . Milk-Related Compounds Diarrhea  . Salicylates Other (See Comments)    Unknown Unknown   . Tetracycline Hcl Hives and Nausea Only  . Tetracyclines & Related Other (See Comments)    Nerves feeling     Current antibiotics:   MEDICATIONS: . acetaminophen  650 mg Oral Q6H   Or  . acetaminophen  650 mg Rectal Q6H  . atorvastatin  80 mg Oral QHS  . Chlorhexidine Gluconate Cloth  6 each Topical Daily  . DULoxetine  60 mg Oral Daily  . gabapentin  300 mg Oral BID  . loratadine  10 mg Oral Daily  . mometasone-formoterol  2 puff Inhalation BID  . pantoprazole  40 mg Oral BID  . polyethylene glycol  17 g Oral Daily  . topiramate  50 mg Oral QHS  Social History   Tobacco Use  . Smoking status: Never Smoker  . Smokeless tobacco: Never Used  Substance Use Topics  . Alcohol use: Yes    Comment: Socially. [name].  . Drug use: No    Family History  Problem Relation Age of Onset  . Heart disease Mother 45       CABG, pacemaker, valve  . Cancer Father   . Heart disease Sister        Fluid around the heart  . Breast cancer Neg Hx     Review of Systems -   Constitutional: Negative for fever, chills, diaphoresis, activity change, appetite change, fatigue and unexpected weight change.  HENT: +neck swelling.Negative for congestion, sore throat, rhinorrhea, sneezing, trouble swallowing and sinus pressure.  Eyes: Negative for photophobia and visual disturbance.  Respiratory: + difficulty breathing due to neck swelling.Negative for  cough, chest tightness, shortness of breath, wheezing and stridor.  Cardiovascular: Negative for chest pain, palpitations and leg swelling.  Gastrointestinal: Negative for nausea, vomiting, abdominal pain, diarrhea, constipation, blood in stool, abdominal distention and anal bleeding.  Genitourinary: Negative for dysuria, hematuria, flank pain and difficulty urinating.  Musculoskeletal: Negative for myalgias, back pain, joint swelling, arthralgias and gait problem.  Skin: Negative for color change, pallor, rash and wound.  Neurological: Negative for dizziness, tremors, weakness and light-headedness.  Hematological: Negative for adenopathy. Does not bruise/bleed easily.  Psychiatric/Behavioral: Negative for behavioral problems, confusion, sleep disturbance, dysphoric mood, decreased concentration and agitation.      OBJECTIVE: Temp:  [98.2 F (36.8 C)-99.1 F (37.3 C)] 98.7 F (37.1 C) (05/06 1834) Pulse Rate:  [76-108] 76 (05/06 1834) Resp:  [18-20] 20 (05/06 1904) BP: (95-158)/(57-94) 125/63 (05/06 1904) SpO2:  [93 %-100 %] 96 % (05/06 1904) Physical Exam  Constitutional:  oriented to person. appears well-developed and well-nourished. No distress.  HENT: [place]/AT, PERRLA, no scleral icterus. Mouth/Throat: Oropharynx is clear and moist. No oropharyngeal exudate.  Cardiovascular: Normal rate, regular rhythm and normal heart sounds. Exam reveals no gallop and no friction rub.  No murmur heard.  Pulmonary/Chest: Effort normal and breath sounds normal. No respiratory distress.  has no wheezes.  Neck = supple, asymmetry. Right sided swelling submandibular region with tender to light touch. Abdominal: Soft. Bowel sounds are normal.  exhibits no distension. There is no tenderness.  Neurological: alert and oriented to person, only.  Skin: Skin is warm and dry. No rash noted. No erythema.  Psychiatric: a normal mood and affect.  behavior is normal.    LABS: Results for orders placed or  performed during the hospital encounter of 12/30/19 (from the past 48 hour(s))  Glucose, capillary     Status: None   Collection Time: 12/30/19  8:23 PM  Result Value Ref Range   Glucose-Capillary 73 70 - 99 mg/dL    Comment: Glucose reference range applies only to samples taken after fasting for at least 8 hours.  Basic metabolic panel     Status: Abnormal   Collection Time: 12/30/19  9:06 PM  Result Value Ref Range   Sodium 143 135 - 145 mmol/L   Potassium 4.4 3.5 - 5.1 mmol/L    Comment: SLIGHT HEMOLYSIS ICTERIC SPECIMEN    Chloride 112 (H) 98 - 111 mmol/L   CO2 23 22 - 32 mmol/L   Glucose, Bld 84 70 - 99 mg/dL    Comment: Glucose reference range applies only to samples taken after fasting for at least 8 hours.   BUN 26 (H) 8 -  23 mg/dL   Creatinine, Ser 1.79 (H) 0.44 - 1.00 mg/dL   Calcium 9.3 8.9 - 10.3 mg/dL   GFR calc non Af Amer 28 (L) >60 mL/min   GFR calc Af Amer 33 (L) >60 mL/min   Anion gap 8 5 - 15    Comment: Performed at [facility] [address] [place], [place] [address]  Glucose, capillary     Status: Abnormal   Collection Time: 12/31/19 12:51 AM  Result Value Ref Range   Glucose-Capillary 64 (L) 70 - 99 mg/dL    Comment: Glucose reference range applies only to samples taken after fasting for at least 8 hours.  Glucose, capillary     Status: Abnormal   Collection Time: 12/31/19  1:08 AM  Result Value Ref Range   Glucose-Capillary 62 (L) 70 - 99 mg/dL    Comment: Glucose reference range applies only to samples taken after fasting for at least 8 hours.  Glucose, capillary     Status: None   Collection Time: 12/31/19  1:46 AM  Result Value Ref Range   Glucose-Capillary 73 70 - 99 mg/dL    Comment: Glucose reference range applies only to samples taken after fasting for at least 8 hours.  Glucose, capillary     Status: Abnormal   Collection Time: 12/31/19  4:39 AM  Result Value Ref Range   Glucose-Capillary 103 (H) 70 - 99 mg/dL    Comment: Glucose  reference range applies only to samples taken after fasting for at least 8 hours.  Basic metabolic panel     Status: Abnormal   Collection Time: 12/31/19  7:42 AM  Result Value Ref Range   Sodium 145 135 - 145 mmol/L   Potassium 4.9 3.5 - 5.1 mmol/L    Comment: SLIGHT HEMOLYSIS   Chloride 116 (H) 98 - 111 mmol/L   CO2 24 22 - 32 mmol/L   Glucose, Bld 145 (H) 70 - 99 mg/dL    Comment: Glucose reference range applies only to samples taken after fasting for at least 8 hours.   BUN 22 8 - 23 mg/dL   Creatinine, Ser 1.60 (H) 0.44 - 1.00 mg/dL   Calcium 9.3 8.9 - 10.3 mg/dL   GFR calc non Af Amer 32 (L) >60 mL/min   GFR calc Af Amer 37 (L) >60 mL/min   Anion gap 5 5 - 15    Comment: Performed at [facility] [address], [place], [place] [address]  Glucose, capillary     Status: Abnormal   Collection Time: 12/31/19  8:00 AM  Result Value Ref Range   Glucose-Capillary 117 (H) 70 - 99 mg/dL    Comment: Glucose reference range applies only to samples taken after fasting for at least 8 hours.  Glucose, capillary     Status: Abnormal   Collection Time: 12/31/19 11:29 AM  Result Value Ref Range   Glucose-Capillary 111 (H) 70 - 99 mg/dL    Comment: Glucose reference range applies only to samples taken after fasting for at least 8 hours.  Glucose, capillary     Status: Abnormal   Collection Time: 12/31/19  3:57 PM  Result Value Ref Range   Glucose-Capillary 112 (H) 70 - 99 mg/dL    Comment: Glucose reference range applies only to samples taken after fasting for at least 8 hours.  Glucose, capillary     Status: None   Collection Time: 12/31/19  8:02 PM  Result Value Ref Range  Glucose-Capillary 87 70 - 99 mg/dL    Comment: Glucose reference range applies only to samples taken after fasting for at least 8 hours.  Glucose, capillary     Status: None   Collection Time: 01/01/20 12:53 AM  Result Value Ref Range   Glucose-Capillary 82 70 - 99 mg/dL    Comment: Glucose reference range  applies only to samples taken after fasting for at least 8 hours.  Glucose, capillary     Status: None   Collection Time: 01/01/20  4:44 AM  Result Value Ref Range   Glucose-Capillary 94 70 - 99 mg/dL    Comment: Glucose reference range applies only to samples taken after fasting for at least 8 hours.  Glucose, capillary     Status: Abnormal   Collection Time: 01/01/20  8:55 AM  Result Value Ref Range   Glucose-Capillary 110 (H) 70 - 99 mg/dL    Comment: Glucose reference range applies only to samples taken after fasting for at least 8 hours.  Glucose, capillary     Status: Abnormal   Collection Time: 01/01/20 11:11 AM  Result Value Ref Range   Glucose-Capillary 109 (H) 70 - 99 mg/dL    Comment: Glucose reference range applies only to samples taken after fasting for at least 8 hours.  Lactic acid, plasma     Status: None   Collection Time: 01/01/20 12:13 PM  Result Value Ref Range   Lactic Acid, Venous 1.1 0.5 - 1.9 mmol/L    Comment: Performed at [facility] [address] [place], [place] [address]  CBC     Status: Abnormal   Collection Time: 01/01/20 12:13 PM  Result Value Ref Range   WBC 15.4 (H) 4.0 - 10.5 K/uL   RBC 3.89 3.87 - 5.11 MIL/uL   Hemoglobin 11.4 (L) 12.0 - 15.0 g/dL   HCT 34.9 (L) 36.0 - 46.0 %   MCV 89.7 80.0 - 100.0 fL   MCH 29.3 26.0 - 34.0 pg   MCHC 32.7 30.0 - 36.0 g/dL   RDW 15.9 (H) 11.5 - 15.5 %   Platelets 217 150 - 400 K/uL   nRBC 0.0 0.0 - 0.2 %    Comment: Performed at [facility] [address], [place], [place] [id number]  Basic metabolic panel     Status: Abnormal   Collection Time: 01/01/20 12:13 PM  Result Value Ref Range   Sodium 149 (H) 135 - 145 mmol/L   Potassium 4.0 3.5 - 5.1 mmol/L   Chloride 114 (H) 98 - 111 mmol/L   CO2 22 22 - 32 mmol/L   Glucose, Bld 126 (H) 70 - 99 mg/dL    Comment: Glucose reference range applies only to samples taken after fasting for at least 8 hours.   BUN 15 8 - 23 mg/dL   Creatinine, Ser  1.48 (H) 0.44 - 1.00 mg/dL   Calcium 9.0 8.9 - 10.3 mg/dL   GFR calc non Af Amer 36 (L) >60 mL/min   GFR calc Af Amer 41 (L) >60 mL/min   Anion gap 13 5 - 15    Comment: Performed at [facility] [address] [place], [place] [address]  Glucose, capillary     Status: Abnormal   Collection Time: 01/01/20  3:56 PM  Result Value Ref Range   Glucose-Capillary 135 (H) 70 - 99 mg/dL    Comment: Glucose reference range applies only to samples taken after fasting for at least 8 hours.  Lactic acid, plasma     Status: None   Collection Time: 01/01/20  4:00 PM  Result Value Ref Range   Lactic Acid, Venous 0.9 0.5 - 1.9 mmol/L    Comment: Performed at [facility] [address] [place], [place] [address]    MICRO:  IMAGING: CT SOFT TISSUE NECK W CONTRAST  Result Date: 01/01/2020 CLINICAL DATA:  Neck abscess.  Deep tissue infection. EXAM: CT NECK WITH CONTRAST TECHNIQUE: Multidetector CT imaging of the neck was performed using the standard protocol following the bolus administration of intravenous contrast. CONTRAST:  65mL OMNIPAQUE IOHEXOL 300 MG/ML  SOLN COMPARISON:  CT neck 12/13/2016 FINDINGS: Pharynx and larynx: Normal. No mass or swelling. Salivary glands: Enlargement of the right submandibular gland which shows increased enhancement. There is extensive surrounding soft tissue edema as well as thickening of the right platysmas muscle. Soft tissue thickening is seen throughout the right neck anteriorly and posteriorly. No obstructing stone. No abscess. Left submandibular gland normal.  Parotid normal bilaterally. Thyroid: Negative Lymph nodes: No enlarged lymph nodes in the neck. Vascular: Normal vascular enhancement. No venous thrombosis identified. Limited intracranial: No acute abnormality. Right temporal craniotomy. Visualized orbits: Not imaged Mastoids and visualized paranasal sinuses: Retention cyst right maxillary sinus otherwise clear. Skeleton: Degenerative changes the  cervical spine with prominent anterior osteophytes C3 through C7. Upper chest: Lung apices clear bilaterally. Other: None IMPRESSION: 1. Enlargement right submandibular gland with hyperenhancement and extensive surrounding soft tissue edema. No obstructing stone and no abscess. Findings most compatible with acute infection of the submandibular gland with surrounding cellulitis. Electronically Signed   By: Franchot Gallo M.D.   On: 01/01/2020 10:26   MR ANKLE LEFT WO CONTRAST  Result Date: 12/31/2019 CLINICAL DATA:  Left ankle pain, remote history of midfoot and hindfoot arthrodesis EXAM: MRI OF THE LEFT ANKLE WITHOUT CONTRAST TECHNIQUE: Multiplanar, multisequence MR imaging of the ankle was performed. No intravenous contrast was administered. COMPARISON:  X-ray 12/30/2018 FINDINGS: TENDONS Peroneal: Intact peroneus longus and peroneus brevis tendons. Posteromedial: Intact tibialis posterior, flexor hallucis longus and flexor digitorum longus tendons. Anterior: Intact tibialis anterior, extensor hallucis longus and extensor digitorum longus tendons. Achilles: Intact. Plantar Fascia: Intact. LIGAMENTS Lateral: Anterior talofibular ligament is not visualized, likely chronically torn. Posterior talofibular and calcaneofibular ligaments appear intact. Anterior and posterior tibiofibular ligaments grossly intact. Medial: Regional artifact and degenerative changes slightly degrade evaluation. No evidence of acute medial ligamentous injury. CARTILAGE Ankle Joint: High-grade chondral loss at the anterior tibiotalar joint with subchondral marrow signal changes and osteophytic spurring. No talar dome osteochondral lesion. Trace joint effusion and synovitis. Subtalar Joints/Sinus Tarsi: Prior subtalar arthrodesis with solid fusion. Bones: Postsurgical changes from midfoot arthrodesis with solid fusion at the subtalar joint, calcaneocuboid joint, and talonavicular joint. Prior second and third TMT joint arthrodesis with solid  fusion at the second TMT joint. No definite third TMT joint bony fusion. No acute fracture. 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 Other: Diffuse subcutaneous edema of the visualized lower leg without a well-defined fluid collection. IMPRESSION: 1. Diffuse subcutaneous edema of the visualized lower leg without a well-defined fluid collection. 2. 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 4. Postsurgical changes from midfoot arthrodesis with solid fusion of the subtalar joint, calcaneocuboid joint, and talonavicular joint. Electronically Signed   By: Davina Poke D.O.   On: 12/31/2019 15:04   Assessment/Plan:  69yo F with acute onset right sided  submandibular swelling suggestive of sialadenitis.   -continue with steroids and when more alert methods to increase salivation such sour candy - continue with amp/sub since sometimes as one can also see with acute bacterial causes such as strep, mssa and other oral pathogens - though she did not have significant systemic symptoms of fever, chills, nor was there any purulence expressed with massaging gland. - if quick improvement with steroids over the next 24-48hrs, can consider stopping iv abtx as well and suspect this is non-infectious cause.  If further questions, please call us back.   Elzie [facility] [phone]

## 2020-01-01 NOTE — Progress Notes (Signed)
Pharmacy Antibiotic Note  Alisha [name] is a 69 y.o. female admitted on 12/30/2019 with L foot pain after fall PTA, hx ankle surgery in the past. Pharmacy has been consulted for Unasyn dosing for retropharyngeal abscess. CT pending.  AKI on admit, improved 5/5.   Hx nausea from Ampicilliin.   Plan:  Unasyn 3gm IV q8h.  Follow renal function, culture data, CT, clinical progress and antibiotic plans.   Weight: 122 kg (268 lb 15.4 oz)  Temp (24hrs), Avg:98.4 F (36.9 C), Min:98 F (36.7 C), Max:99 F (37.2 C)  Recent Labs  Lab 12/30/19 0833 12/30/19 2106 12/31/19 0742  WBC 7.8  --   --   CREATININE 2.43* 1.79* 1.60*  LATICACIDVEN 1.0  --   --     Estimated Creatinine Clearance: 43.6 mL/min (A) (by C-G formula based on SCr of 1.6 mg/dL (H)).    Allergies  Allergen Reactions  . Ibuprofen     Other reaction(s): Upset Stomach  . Ampicillin Nausea Only  . Asa [Aspirin] Diarrhea    Other reaction(s): Upset Stomach  . Darvon [Propoxyphene] Nausea And Vomiting  . Demeclocycline Other (See Comments)    Nerves feeling  . Eggs Or Egg-Derived Products Diarrhea  . Lactalbumin Diarrhea  . Lisinopril Cough  . Milk-Related Compounds Diarrhea  . Salicylates Other (See Comments)    Unknown Unknown   . Tetracycline Hcl Hives and Nausea Only  . Tetracyclines & Related Other (See Comments)    Nerves feeling     Antimicrobials this admission:  Unasyn 5/6 >>  Dose adjustments this admission:  n/a  Microbiology results:  5/6 blood x 2:  5/4 COVID and flu: negative  Thank you for allowing pharmacy to be a part of this patient's care.  Arty Baumgartner, [place] Phone: [phone] 01/01/2020 9:48 AM

## 2020-01-01 NOTE — Progress Notes (Signed)
Spoke with Dr. Sheppard Coil about patient still being very sleepy and hard to understand after receiving dose of Dilaudid this morning. Will continue to closely monitor.

## 2020-01-01 NOTE — TOC Progression Note (Signed)
Transition of Care [facility]) - Progression Note    Patient Details  Name: Alisha [name] MRN: [id number] Date of Birth: 1951-03-14  [facility]) CM/SW [place], [place] Phone Number: 01/01/2020, 11:37 AM  Clinical Narrative:    CSW spoke with pt daughter [name] via telephone. Pt daughter already familiar with this Probation officer from pt prior hospital stay. Pt daughter confirms pt recently discharged from SNF and returned home with DME (rollator, cane, motorized wheelchair, and oxygen). Pt had two falls and pt daughters (who live together) realize that pt really is best served by being in a LTC placement. They understand that at this time SNF is recommended and that due to mobility needs this would likely be the safest with possible later transfer to ALF if appropriate. We discussed that LTC beds are often difficult to come by and options may be limited. Pt daughter states understanding. Gives permission for referral to be sent through to SNFs in the area.   Pt daughter also interested in beginning process of POA. She acknowledges that pt memory waxes and wanes. Pt daughter was told the hospital CSW starts this process. CSW explained that medical team may be able to write a letter stating that pt needs assistance with managing her affairs but they may punt that to her PCP office.   CSW will reach out to MD to see if he would be amenable to this. TOC team to send SNF referrals for LTC bed.    Expected Discharge Plan: [place] Barriers to Discharge: Continued Medical Work up, Ship broker  Expected Discharge Plan and Services Expected Discharge Plan: [place] In-house Referral: Clinical Social Work Discharge Planning Services: CM Consult  Readmission Risk Interventions Readmission Risk Prevention Plan 12/08/2019  Transportation Screening Complete  PCP or Specialist Appt within 5-7 Days Not Complete  Not Complete comments  plan for SNF  Home Care Screening Complete  Medication Review (RN CM) Referral to Pharmacy  Some recent data might be hidden

## 2020-01-01 NOTE — Progress Notes (Signed)
Pt being transferred to 3W bed 12. Report given to [employer].

## 2020-01-01 NOTE — NC FL2 (Signed)
[place] LEVEL OF CARE SCREENING TOOL     IDENTIFICATION  Patient Name: Alisha [name] Birthdate: 1951/08/10 Sex: female Admission Date (Current Location): 12/30/2019  [facility] and [place] Number:  Herbalist and Address:  [facility], [place] [address] [place], [place] [address]      Provider Number: [id number]  Attending Physician Name and Address:  Velna Ochs, MD  Relative Name and Phone Number:       Current Level of Care: Hospital Recommended Level of Care: [place] Prior Approval Number:    Date Approved/Denied:   PASRR Number: [id number]  Discharge Plan: SNF    Current Diagnoses: Patient Active Problem List   Diagnosis Date Noted  . Urinary retention   . Cerebrovascular accident (CVA) ([place])   . Altered mental status   . Fall   . AKI (acute kidney injury) ([place]) 11/03/2019  . Recurrent falls 09/22/2018  . Chronic use of opiate drug for therapeutic purpose 04/11/2018  . Bradycardia 04/11/2018  . Chronic fatigue 04/11/2018  . Prediabetes 12/17/2017  . Mild cognitive impairment 12/13/2017  . Cough variant asthma vs UACS 10/26/2017  . Upper airway cough syndrome 10/25/2017  . Medial epicondylitis, right 09/16/2017  . Right shoulder pain 09/07/2017  . Lateral epicondylitis of right elbow 09/07/2017  . Vitamin D deficiency 07/13/2017  . History of foot fracture 07/12/2017  . History of colon polyps 07/12/2017  . Cataract of both eyes 07/11/2017  . Hypertensive retinopathy of both eyes 07/11/2017  . Fibromyalgia 05/17/2017  . Tenosynovitis, wrist 05/14/2017  . Chronic cough 05/02/2017  . Foot pain, left 05/02/2017  . Dysphagia 03/23/2017  . Health care maintenance 03/23/2017  . Coronary artery disease involving native coronary artery of native heart without angina pectoris 01/11/2017  . Hyperlipidemia 01/11/2017  . Polypharmacy 11/25/2016  . Venous stasis of both lower  extremities 10/04/2016  . Poor dentition 10/04/2016  . OSA (obstructive sleep apnea) 10/04/2016  . History of nephrolithiasis 10/04/2016  . Hx of pilonidal cyst 10/04/2016  . Bipolar depression ([place]) 10/04/2016  . Personal history of DVT (deep vein thrombosis) 10/04/2016  . Morbid obesity due to excess calories ([place]) 09/25/2016  . History of pituitary adenoma s/p resection 09/25/2016  . Essential hypertension 09/25/2016  . Psoriatic arthritis ([place]) 09/25/2016  . Vision impairment 09/25/2016  . Chronic seasonal allergic rhinitis due to pollen 09/25/2016  . Moderate persistent asthma without complication [id number]  . Gastroesophageal reflux disease without esophagitis 09/25/2016  . Urge incontinence 09/25/2016  . Chronic pain syndrome 09/25/2016  . Candidal intertrigo 05/10/2010    Orientation RESPIRATION BLADDER Height & Weight     Self, Time, Place, Situation  Normal Indwelling catheter, Continent Weight: 268 lb 15.4 oz (122 kg) Height:     BEHAVIORAL SYMPTOMS/MOOD NEUROLOGICAL BOWEL NUTRITION STATUS      Continent Diet(see discharge summary)  AMBULATORY STATUS COMMUNICATION OF NEEDS Skin   Extensive Assist Verbally Normal, Other (Comment)(dry)                       Personal Care Assistance Level of Assistance  Bathing, Feeding, Dressing Bathing Assistance: Maximum assistance Feeding assistance: Independent Dressing Assistance: Maximum assistance     Functional Limitations Info  Sight, Hearing, Speech Sight Info: Adequate Hearing Info: Adequate Speech Info: Adequate    SPECIAL CARE FACTORS FREQUENCY  PT (By licensed PT), OT (By licensed OT)     PT Frequency: 5x week OT Frequency: 5x week  Contractures Contractures Info: Not present    Additional Factors Info  Code Status, Allergies, Psychotropic Code Status Info: Full Code Allergies Info: Ibuprofen, Ampicillin, Asa (Aspirin), Darvon (Propoxphene), Demeclocycline, Eggs Or Egg-derived  Products, Lactalbumin, Lisinopril, Milk-related Compounds, Salicylates, Tetracycline Hcl, Tetracyclines & Related Psychotropic Info: DULoxetine (CYMBALTA) DR capsule 60 mg daily PO         Current Medications (01/01/2020):  This is the current hospital active medication list Current Facility-Administered Medications  Medication Dose Route Frequency Provider Last Rate Last Admin  . acetaminophen (TYLENOL) tablet 650 mg  650 mg Oral Q6H Earlene Plater, MD       Or  . acetaminophen (TYLENOL) suppository 650 mg  650 mg Rectal Q6H Earlene Plater, MD      . albuterol (PROVENTIL) (2.5 MG/3ML) 0.083% nebulizer solution 2.5 mg  2.5 mg Nebulization Q6H PRN Helberg, Larkin Ina, MD      . atorvastatin (LIPITOR) tablet 80 mg  80 mg Oral QHS Ina Homes, MD   80 mg at 12/31/19 2040  . Chlorhexidine Gluconate Cloth 2 % PADS 6 each  6 each Topical Daily Velna Ochs, MD   6 each at 12/31/19 1142  . DULoxetine (CYMBALTA) DR capsule 60 mg  60 mg Oral Daily Ina Homes, MD   60 mg at 12/31/19 [id number]  . gabapentin (NEURONTIN) capsule 300 mg  300 mg Oral BID Ina Homes, MD   300 mg at 12/31/19 2040  . loratadine (CLARITIN) tablet 10 mg  10 mg Oral Daily Ina Homes, MD   10 mg at 12/31/19 [id number]  . mometasone-formoterol (DULERA) 100-5 MCG/ACT inhaler 2 puff  2 puff Inhalation BID Ina Homes, MD   2 puff at 01/01/20 0821  . ondansetron (ZOFRAN) tablet 4 mg  4 mg Oral Q6H PRN Ina Homes, MD       Or  . ondansetron (ZOFRAN) injection 4 mg  4 mg Intravenous Q6H PRN Helberg, Justin, MD      . oxyCODONE (Oxy IR/ROXICODONE) immediate release tablet 5 mg  5 mg Oral Q6H PRN Earlene Plater, MD      . pantoprazole (PROTONIX) EC tablet 40 mg  40 mg Oral BID Ina Homes, MD   40 mg at 12/31/19 2040  . polyethylene glycol (MIRALAX / GLYCOLAX) packet 17 g  17 g Oral Daily Earlene Plater, MD   17 g at 12/31/19 1211  . topiramate (TOPAMAX) tablet 50 mg  50 mg Oral QHS Ina Homes, MD    50 mg at 12/31/19 2040     Discharge Medications: Please see discharge summary for a list of discharge medications.  Relevant Imaging Results:  Relevant Lab Results:   Additional Information SSN: [id number]  Alexander Mt, LCSW

## 2020-01-01 NOTE — Progress Notes (Signed)
Informed Dr. Daymon Larsen about the patient's neck pain and swelling. He says they will come to see the patient.

## 2020-01-01 NOTE — Consult Note (Signed)
Reason for Consult: Submandibular sialadenitis Referring Physician: Dr. Danny Lawless Alisha [name] is an 69 y.o. female.  HPI: 69 year old female admitted 12/30/19 due to a left ankle injury and found to have an AKI and hypoglycemia.  Early this morning, she acutely developed right neck swelling and pain and was having trouble breathing and swallowing.  A CT scan demonstrated right submandibular sialadenitis and she was started on Unasyn and given Decadron.  This evening, she reports feeling improvement in her swelling and pain.  She is able to talk now and is breathing easily.  Past Medical History:  Diagnosis Date  . AKI (acute kidney injury) ([place])    12/31/2019  . Arthritis   . Asthma   . Bipolar disorder ([place])   . CAD (coronary artery disease) 07/07/2016   a. s/p stenting x 2 in [place] to the LAD, RCA is chronically occluded, LVEDP was 34  . Cancer (HCC)    cervical  . Depression   . Fibromyalgia   . GERD (gastroesophageal reflux disease)   . History of benign pituitary tumor   . History of kidney stones    1972  . Hyperlipidemia   . Hypertension   . Migraine   . Morbid obesity ([place])   . Peripheral vascular disease ([place])   . Pneumonia   . Seizures ([place])    during surgery for pituitary tumor  . Sleep apnea    with cpap  . Vertigo     Past Surgical History:  Procedure Laterality Date  . ANKLE FUSION Bilateral   . carpel tunnel release Bilateral 2017  . CORONARY ANGIOPLASTY    . ESOPHAGOGASTRODUODENOSCOPY (EGD) WITH PROPOFOL N/A 06/14/2017   Procedure: ESOPHAGOGASTRODUODENOSCOPY (EGD) WITH PROPOFOL;  Surgeon: Wilford Corner, MD;  Location: [facility];  Service: Endoscopy;  Laterality: N/A;  . EXCISION / CURETTAGE BONE CYST PHALANGES OF FOOT  2014   Removal of foot cyst   . PARTIAL HYSTERECTOMY  unknown   Patient still has ovaries  . pituitary tumor removal  1999   removed as much as they could, on optic nerve  . TONSILLECTOMY    . TOTAL KNEE  ARTHROPLASTY     TKR X 3  2 on the left and 1 on the right    Family History  Problem Relation Age of Onset  . Heart disease Mother 29       CABG, pacemaker, valve  . Cancer Father   . Heart disease Sister        Fluid around the heart  . Breast cancer Neg Hx     Social History:  reports that she has never smoked. She has never used smokeless tobacco. She reports current alcohol use. She reports that she does not use drugs.  Allergies:  Allergies  Allergen Reactions  . Ibuprofen     Other reaction(s): Upset Stomach  . Ampicillin Nausea Only  . Asa [Aspirin] Diarrhea    Other reaction(s): Upset Stomach  . Darvon [Propoxyphene] Nausea And Vomiting  . Demeclocycline Other (See Comments)    Nerves feeling  . Eggs Or Egg-Derived Products Diarrhea  . Lactalbumin Diarrhea  . Lisinopril Cough  . Milk-Related Compounds Diarrhea  . Salicylates Other (See Comments)    Unknown Unknown   . Tetracycline Hcl Hives and Nausea Only  . Tetracyclines & Related Other (See Comments)    Nerves feeling     Medications: I have reviewed the patient's current medications.  Results for orders placed or performed during the  hospital encounter of 12/30/19 (from the past 48 hour(s))  Glucose, capillary     Status: None   Collection Time: 12/30/19  8:23 PM  Result Value Ref Range   Glucose-Capillary 73 70 - 99 mg/dL    Comment: Glucose reference range applies only to samples taken after fasting for at least 8 hours.  Basic metabolic panel     Status: Abnormal   Collection Time: 12/30/19  9:06 PM  Result Value Ref Range   Sodium 143 135 - 145 mmol/L   Potassium 4.4 3.5 - 5.1 mmol/L    Comment: SLIGHT HEMOLYSIS ICTERIC SPECIMEN    Chloride 112 (H) 98 - 111 mmol/L   CO2 23 22 - 32 mmol/L   Glucose, Bld 84 70 - 99 mg/dL    Comment: Glucose reference range applies only to samples taken after fasting for at least 8 hours.   BUN 26 (H) 8 - 23 mg/dL   Creatinine, Ser 1.79 (H) 0.44 - 1.00 mg/dL    Calcium 9.3 8.9 - 10.3 mg/dL   GFR calc non Af Amer 28 (L) >60 mL/min   GFR calc Af Amer 33 (L) >60 mL/min   Anion gap 8 5 - 15    Comment: Performed at [facility] [address] [place], [place] [address]  Glucose, capillary     Status: Abnormal   Collection Time: 12/31/19 12:51 AM  Result Value Ref Range   Glucose-Capillary 64 (L) 70 - 99 mg/dL    Comment: Glucose reference range applies only to samples taken after fasting for at least 8 hours.  Glucose, capillary     Status: Abnormal   Collection Time: 12/31/19  1:08 AM  Result Value Ref Range   Glucose-Capillary 62 (L) 70 - 99 mg/dL    Comment: Glucose reference range applies only to samples taken after fasting for at least 8 hours.  Glucose, capillary     Status: None   Collection Time: 12/31/19  1:46 AM  Result Value Ref Range   Glucose-Capillary 73 70 - 99 mg/dL    Comment: Glucose reference range applies only to samples taken after fasting for at least 8 hours.  Glucose, capillary     Status: Abnormal   Collection Time: 12/31/19  4:39 AM  Result Value Ref Range   Glucose-Capillary 103 (H) 70 - 99 mg/dL    Comment: Glucose reference range applies only to samples taken after fasting for at least 8 hours.  Basic metabolic panel     Status: Abnormal   Collection Time: 12/31/19  7:42 AM  Result Value Ref Range   Sodium 145 135 - 145 mmol/L   Potassium 4.9 3.5 - 5.1 mmol/L    Comment: SLIGHT HEMOLYSIS   Chloride 116 (H) 98 - 111 mmol/L   CO2 24 22 - 32 mmol/L   Glucose, Bld 145 (H) 70 - 99 mg/dL    Comment: Glucose reference range applies only to samples taken after fasting for at least 8 hours.   BUN 22 8 - 23 mg/dL   Creatinine, Ser 1.60 (H) 0.44 - 1.00 mg/dL   Calcium 9.3 8.9 - 10.3 mg/dL   GFR calc non Af Amer 32 (L) >60 mL/min   GFR calc Af Amer 37 (L) >60 mL/min   Anion gap 5 5 - 15    Comment: Performed at [facility] [address], [place], [place] [address]  Glucose, capillary     Status:  Abnormal   Collection Time:  12/31/19  8:00 AM  Result Value Ref Range   Glucose-Capillary 117 (H) 70 - 99 mg/dL    Comment: Glucose reference range applies only to samples taken after fasting for at least 8 hours.  Glucose, capillary     Status: Abnormal   Collection Time: 12/31/19 11:29 AM  Result Value Ref Range   Glucose-Capillary 111 (H) 70 - 99 mg/dL    Comment: Glucose reference range applies only to samples taken after fasting for at least 8 hours.  Glucose, capillary     Status: Abnormal   Collection Time: 12/31/19  3:57 PM  Result Value Ref Range   Glucose-Capillary 112 (H) 70 - 99 mg/dL    Comment: Glucose reference range applies only to samples taken after fasting for at least 8 hours.  Glucose, capillary     Status: None   Collection Time: 12/31/19  8:02 PM  Result Value Ref Range   Glucose-Capillary 87 70 - 99 mg/dL    Comment: Glucose reference range applies only to samples taken after fasting for at least 8 hours.  Glucose, capillary     Status: None   Collection Time: 01/01/20 12:53 AM  Result Value Ref Range   Glucose-Capillary 82 70 - 99 mg/dL    Comment: Glucose reference range applies only to samples taken after fasting for at least 8 hours.  Glucose, capillary     Status: None   Collection Time: 01/01/20  4:44 AM  Result Value Ref Range   Glucose-Capillary 94 70 - 99 mg/dL    Comment: Glucose reference range applies only to samples taken after fasting for at least 8 hours.  Glucose, capillary     Status: Abnormal   Collection Time: 01/01/20  8:55 AM  Result Value Ref Range   Glucose-Capillary 110 (H) 70 - 99 mg/dL    Comment: Glucose reference range applies only to samples taken after fasting for at least 8 hours.  Glucose, capillary     Status: Abnormal   Collection Time: 01/01/20 11:11 AM  Result Value Ref Range   Glucose-Capillary 109 (H) 70 - 99 mg/dL    Comment: Glucose reference range applies only to samples taken after fasting for at least 8 hours.   Lactic acid, plasma     Status: None   Collection Time: 01/01/20 12:13 PM  Result Value Ref Range   Lactic Acid, Venous 1.1 0.5 - 1.9 mmol/L    Comment: Performed at [facility] [address], [place], [place] [address]  CBC     Status: Abnormal   Collection Time: 01/01/20 12:13 PM  Result Value Ref Range   WBC 15.4 (H) 4.0 - 10.5 K/uL   RBC 3.89 3.87 - 5.11 MIL/uL   Hemoglobin 11.4 (L) 12.0 - 15.0 g/dL   HCT 34.9 (L) 36.0 - 46.0 %   MCV 89.7 80.0 - 100.0 fL   MCH 29.3 26.0 - 34.0 pg   MCHC 32.7 30.0 - 36.0 g/dL   RDW 15.9 (H) 11.5 - 15.5 %   Platelets 217 150 - 400 K/uL   nRBC 0.0 0.0 - 0.2 %    Comment: Performed at [facility] [address] [place], [place] [id number]  Basic metabolic panel     Status: Abnormal   Collection Time: 01/01/20 12:13 PM  Result Value Ref Range   Sodium 149 (H) 135 - 145 mmol/L   Potassium 4.0 3.5 - 5.1 mmol/L   Chloride 114 (H) 98 - 111 mmol/L  CO2 22 22 - 32 mmol/L   Glucose, Bld 126 (H) 70 - 99 mg/dL    Comment: Glucose reference range applies only to samples taken after fasting for at least 8 hours.   BUN 15 8 - 23 mg/dL   Creatinine, Ser 1.48 (H) 0.44 - 1.00 mg/dL   Calcium 9.0 8.9 - 10.3 mg/dL   GFR calc non Af Amer 36 (L) >60 mL/min   GFR calc Af Amer 41 (L) >60 mL/min   Anion gap 13 5 - 15    Comment: Performed at [facility] [address] [place], [place] [address]  Glucose, capillary     Status: Abnormal   Collection Time: 01/01/20  3:56 PM  Result Value Ref Range   Glucose-Capillary 135 (H) 70 - 99 mg/dL    Comment: Glucose reference range applies only to samples taken after fasting for at least 8 hours.  Lactic acid, plasma     Status: None   Collection Time: 01/01/20  4:00 PM  Result Value Ref Range   Lactic Acid, Venous 0.9 0.5 - 1.9 mmol/L    Comment: Performed at [facility] [address] [place], [place] [address]    CT SOFT TISSUE NECK W CONTRAST  Result Date: 01/01/2020 CLINICAL  DATA:  Neck abscess.  Deep tissue infection. EXAM: CT NECK WITH CONTRAST TECHNIQUE: Multidetector CT imaging of the neck was performed using the standard protocol following the bolus administration of intravenous contrast. CONTRAST:  31mL OMNIPAQUE IOHEXOL 300 MG/ML  SOLN COMPARISON:  CT neck 12/13/2016 FINDINGS: Pharynx and larynx: Normal. No mass or swelling. Salivary glands: Enlargement of the right submandibular gland which shows increased enhancement. There is extensive surrounding soft tissue edema as well as thickening of the right platysmas muscle. Soft tissue thickening is seen throughout the right neck anteriorly and posteriorly. No obstructing stone. No abscess. Left submandibular gland normal.  Parotid normal bilaterally. Thyroid: Negative Lymph nodes: No enlarged lymph nodes in the neck. Vascular: Normal vascular enhancement. No venous thrombosis identified. Limited intracranial: No acute abnormality. Right temporal craniotomy. Visualized orbits: Not imaged Mastoids and visualized paranasal sinuses: Retention cyst right maxillary sinus otherwise clear. Skeleton: Degenerative changes the cervical spine with prominent anterior osteophytes C3 through C7. Upper chest: Lung apices clear bilaterally. Other: None IMPRESSION: 1. Enlargement right submandibular gland with hyperenhancement and extensive surrounding soft tissue edema. No obstructing stone and no abscess. Findings most compatible with acute infection of the submandibular gland with surrounding cellulitis. Electronically Signed   By: Franchot Gallo M.D.   On: 01/01/2020 10:26   MR ANKLE LEFT WO CONTRAST  Result Date: 12/31/2019 CLINICAL DATA:  Left ankle pain, remote history of midfoot and hindfoot arthrodesis EXAM: MRI OF THE LEFT ANKLE WITHOUT CONTRAST TECHNIQUE: Multiplanar, multisequence MR imaging of the ankle was performed. No intravenous contrast was administered. COMPARISON:  X-ray 12/30/2018 FINDINGS: TENDONS Peroneal: Intact peroneus  longus and peroneus brevis tendons. Posteromedial: Intact tibialis posterior, flexor hallucis longus and flexor digitorum longus tendons. Anterior: Intact tibialis anterior, extensor hallucis longus and extensor digitorum longus tendons. Achilles: Intact. Plantar Fascia: Intact. LIGAMENTS Lateral: Anterior talofibular ligament is not visualized, likely chronically torn. Posterior talofibular and calcaneofibular ligaments appear intact. Anterior and posterior tibiofibular ligaments grossly intact. Medial: Regional artifact and degenerative changes slightly degrade evaluation. No evidence of acute medial ligamentous injury. CARTILAGE Ankle Joint: High-grade chondral loss at the anterior tibiotalar joint with subchondral marrow signal changes and osteophytic spurring. No talar dome osteochondral lesion. Trace joint  effusion and synovitis. Subtalar Joints/Sinus Tarsi: Prior subtalar arthrodesis with solid fusion. Bones: Postsurgical changes from midfoot arthrodesis with solid fusion at the subtalar joint, calcaneocuboid joint, and talonavicular joint. Prior second and third TMT joint arthrodesis with solid fusion at the second TMT joint. No definite third TMT joint bony fusion. No acute fracture. 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 Other: Diffuse subcutaneous edema of the visualized lower leg without a well-defined fluid collection. IMPRESSION: 1. Diffuse subcutaneous edema of the visualized lower leg without a well-defined fluid collection. 2. 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 4. Postsurgical changes from midfoot arthrodesis with solid fusion of the subtalar joint, calcaneocuboid joint, and talonavicular joint. Electronically Signed   By: Davina Poke D.O.   On: 12/31/2019 15:04    Review of Systems  HENT: Positive for trouble swallowing.   Musculoskeletal: Positive for  arthralgias and neck pain.  All other systems reviewed and are negative.  Blood pressure 121/65, pulse 94, temperature 99.1 F (37.3 C), temperature source Oral, resp. rate 20, weight 122 kg, SpO2 95 %. Physical Exam  Constitutional: She is oriented to person, place, and time. She appears well-developed and well-nourished. No distress.  HENT:  Head: Normocephalic and atraumatic.  Right Ear: External ear normal.  Left Ear: External ear normal.  Nose: Nose normal.  Mouth/Throat: Oropharynx is clear and moist.  Eyes: Pupils are equal, round, and reactive to light. Conjunctivae and EOM are normal.  Neck:  Marked edema of right neck centered at submandibular gland, tender.  Massage of gland does not produce flow from duct in mouth.  Cardiovascular: Normal rate.  Respiratory: Effort normal.  Neurological: She is alert and oriented to person, place, and time. No cranial nerve deficit.  Skin: Skin is warm and dry.  Psychiatric: She has a normal mood and affect. Her behavior is normal. Judgment and thought content normal.    Assessment/Plan: Right submandibular sialadenitis  I personally reviewed the neck CT and discussed findings with her primary care team.  Most commonly, this type of infection arises in the context of dehydration.  Continue Unasyn, good hydration, frequent use of heat and massage, and use of sialagogues (sour candy) to promote saliva flow.  Melida Quitter 01/01/2020, 6:30 PM

## 2020-01-01 NOTE — Progress Notes (Addendum)
Subjective:   Pt was seen at the bedside today.  Patient is visibly uncomfortable and having difficulty with her secretions.  She states that her neck is swelling and is in significant amount of pain.  She says she is breathing okay at the moment, but it is significantly harder to breathe than it was yesterday.  Denies any fevers.  The swelling in her neck was not present yesterday.  Objective: CBC Latest Ref Rng & Units 12/30/2019 12/10/2019 12/09/2019  WBC 4.0 - 10.5 K/uL 7.8 7.7 6.7  Hemoglobin 12.0 - 15.0 g/dL 12.0 11.6(L) 10.8(L)  Hematocrit 36.0 - 46.0 % 38.2 36.0 33.7(L)  Platelets 150 - 400 K/uL 275 217 206   BMP Latest Ref Rng & Units 12/31/2019 12/30/2019 12/30/2019  Glucose 70 - 99 mg/dL 145(H) 84 62(L)  BUN 8 - 23 mg/dL 22 26(H) 31(H)  Creatinine 0.44 - 1.00 mg/dL 1.60(H) 1.79(H) 2.43(H)  BUN/Creat Ratio 12 - 28 - - -  Sodium 135 - 145 mmol/L 145 143 142  Potassium 3.5 - 5.1 mmol/L 4.9 4.4 4.1  Chloride 98 - 111 mmol/L 116(H) 112(H) 110  CO2 22 - 32 mmol/L 24 23 18(L)  Calcium 8.9 - 10.3 mg/dL 9.3 9.3 9.6   Vital signs in last 24 hours: Vitals:   12/31/19 1732 12/31/19 2112 01/01/20 0004 01/01/20 0445  BP: (!) 151/74  136/78 (!) 156/88  Pulse: 78 78 88 (!) 108  Resp: 18 18 18 18   Temp: 98.3 F (36.8 C)  98.2 F (36.8 C) 99 F (37.2 C)  TempSrc:   Oral Oral  SpO2: 100% 100% 100% 98%  Weight:       Physical Exam Physical Exam Vitals reviewed.  Constitutional:      General: She is in acute distress.     Appearance: She is obese. She is ill-appearing and diaphoretic.  HENT:     Head: Normocephalic and atraumatic.     Mouth/Throat:     Pharynx: Oropharyngeal exudate (yellow pus back R side of pharynx with surrounding edema) present.  Cardiovascular:     Pulses: Normal pulses.     Heart sounds: Normal heart sounds. No murmur. No friction rub. No gallop.   Pulmonary:     Breath sounds: Normal breath sounds. No wheezing or rales.     Comments: Upper airway transmitted  sounds Musculoskeletal:     Cervical back: Tenderness: R sided neck swelling that is hot to palpation.  Skin:    General: Skin is warm.  Neurological:     General: No focal deficit present.     Mental Status: She is alert.    Assessment/Plan:  Active Problems:   AKI (acute kidney injury) ([place])   Urinary retention  In summary, Ms. Kissee is a 69 y.o female with fibromyalgia, bipolar, PAD, CAD, chronic venous insufficiency, and HTN who presented to the ED via EMS with acute onset left foot pain and AKI.  Overnight, the patient developed rapid onset of neck swelling and pharyngeal exudate causing difficulties with breathing.   #Neck Swelling #R submandibular gland infection #Cellulitis: Stat CT neck & soft tissue w/ contrast obtained demonstrated enlargement of the right submandibular gland with surrounding soft tissue edema.  No abscess seen. -Stat CBC, BMP, blood cultures, lactic acid ordered. Labs have not been collected today, will reach out to phlebotomy  -ID has been consulted, appreciate recommendations   -IV Unasyn -Dilaudid 0.5mg  q3hrs PRN -ENT consulted, will appreciate recommendations -Contacted PCCM in case of decompensation, given rapid onset  of neck swelling and current respiratory distress.  #AKI: Creatinine was elevated to 2.43 on admission. Downtrended to 1.79>>1.6 yesterday but labs have not been obtained today. This is suspected to be due to poor p.o. intake and increased NSAID use for pain. -LR 1L bolus in light of CT w/ contrast -Strict I/Os -Hold anticholinergic medications -Avoid nephrotoxic medications like diuretics -Daily BMP   #L foot pain: Patient was exquisitely tender to palpation of her left ankle. X-rays menstruated hardware from previous ankle fusion surgery but no fractures.   MRI L ankle demonstrated diffuse subcutaneous edema without IV fluid collection.  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  Chronic ATFL tear -Oxycodone IR 5 mg q12hrs PRN -Tylenol 650 mg q6hrs PRN  #PAD #CAD #HTN -Aspirin 81 mg daily -Plavix 75 mg daily -Atorvastatin 80 mg daily   #Chronic venous insufficiency -Continue wraps and elevation   #T2DM: Pt takes metformin at home -hold home metformin  -SSI  #FEN/GI -Diet: heart healthy -Fluids: LR 100 cc/hr -Protonix 40 mg daily -Zofran 4 mg every 6 hours as needed -MiraLAX daily   #DVT prophylaxis -Lovenox 40 mg subq injections daily  #CODE STATUS: FULL  #Dispo: Anticipated discharge in approximately 2-3 days Prior to Admission Living Arrangement: Home Anticipated Discharge Location: Home or SNF Barriers to Discharge: Ongoing medical workup, PT/OT evaluation  Earlene Plater, MD Internal Medicine, PGY1 Pager: [phone]  01/01/2020,6:56 AM

## 2020-01-02 LAB — GLUCOSE, CAPILLARY
Glucose-Capillary: 88 mg/dL (ref 70–99)
Glucose-Capillary: 110 mg/dL — ABNORMAL HIGH (ref 70–99)
Glucose-Capillary: 90 mg/dL (ref 70–99)
Glucose-Capillary: 93 mg/dL (ref 70–99)

## 2020-01-02 LAB — BASIC METABOLIC PANEL
Anion gap: 9 (ref 5–15)
BUN: 16 mg/dL (ref 8–23)
CO2: 21 mmol/L — ABNORMAL LOW (ref 22–32)
Calcium: 8.5 mg/dL — ABNORMAL LOW (ref 8.9–10.3)
Chloride: 115 mmol/L — ABNORMAL HIGH (ref 98–111)
Creatinine, Ser: 1.42 mg/dL — ABNORMAL HIGH (ref 0.44–1.00)
GFR calc Af Amer: 43 mL/min — ABNORMAL LOW (ref >60)
GFR calc non Af Amer: 37 mL/min — ABNORMAL LOW (ref >60)
Glucose, Bld: 96 mg/dL (ref 70–99)
Potassium: 3.4 mmol/L — ABNORMAL LOW (ref 3.5–5.1)
Sodium: 145 mmol/L (ref 135–145)

## 2020-01-02 LAB — CBC
HCT: 29.2 % — ABNORMAL LOW (ref 36.0–46.0)
Hemoglobin: 9.4 g/dL — ABNORMAL LOW (ref 12.0–15.0)
MCH: 28.5 pg (ref 26.0–34.0)
MCHC: 32.2 g/dL (ref 30.0–36.0)
MCV: 88.5 fL (ref 80.0–100.0)
Platelets: 180 10*3/uL (ref 150–400)
RBC: 3.3 MIL/uL — ABNORMAL LOW (ref 3.87–5.11)
RDW: 15.6 % — ABNORMAL HIGH (ref 11.5–15.5)
WBC: 20 10*3/uL — ABNORMAL HIGH (ref 4.0–10.5)
nRBC: 0 % (ref 0.0–0.2)

## 2020-01-02 MED ORDER — POTASSIUM CHLORIDE CRYS ER 20 MEQ PO TBCR
40.0000 meq | EXTENDED_RELEASE_TABLET | Freq: Once | ORAL | Status: AC
  Administered 2020-01-02: 40 meq via ORAL
  Filled 2020-01-02: qty 2

## 2020-01-02 MED ORDER — HYDROMORPHONE HCL 1 MG/ML IJ SOLN: 0.5000 mg | INTRAMUSCULAR | Status: DC | PRN

## 2020-01-02 MED ORDER — WHITE PETROLATUM EX OINT
TOPICAL_OINTMENT | CUTANEOUS | Status: AC
  Filled 2020-01-02: qty 28.35

## 2020-01-02 MED ORDER — SENNA 8.6 MG PO TABS
1.0000 | ORAL_TABLET | Freq: Two times a day (BID) | ORAL | Status: DC
  Administered 2020-01-02 – 2020-01-04 (×5): 8.6 mg via ORAL
  Filled 2020-01-02 (×6): qty 1

## 2020-01-02 MED ORDER — CLOPIDOGREL BISULFATE 75 MG PO TABS
75.0000 mg | ORAL_TABLET | Freq: Every day | ORAL | Status: DC
  Administered 2020-01-02 – 2020-01-05 (×3): 75 mg via ORAL
  Filled 2020-01-02 (×4): qty 1

## 2020-01-02 MED ORDER — ASPIRIN EC 81 MG PO TBEC
81.0000 mg | DELAYED_RELEASE_TABLET | Freq: Every day | ORAL | Status: DC
  Administered 2020-01-02 – 2020-01-05 (×3): 81 mg via ORAL
  Filled 2020-01-02 (×4): qty 1

## 2020-01-02 MED ORDER — ENOXAPARIN SODIUM 40 MG/0.4ML ~~LOC~~ SOLN
40.0000 mg | SUBCUTANEOUS | Status: DC
  Administered 2020-01-02 – 2020-01-05 (×4): 40 mg via SUBCUTANEOUS
  Filled 2020-01-02 (×4): qty 0.4

## 2020-01-02 MED ORDER — DEXAMETHASONE SODIUM PHOSPHATE 10 MG/ML IJ SOLN: 10.0000 mg | Freq: Every day | INTRAMUSCULAR | Status: DC

## 2020-01-02 MED ORDER — POLYETHYLENE GLYCOL 3350 17 G PO PACK
17.0000 g | PACK | Freq: Two times a day (BID) | ORAL | Status: DC
  Administered 2020-01-02 – 2020-01-04 (×2): 17 g via ORAL
  Filled 2020-01-02 (×5): qty 1

## 2020-01-02 NOTE — Progress Notes (Signed)
Patient arrived in the unit at 2250 pm from [facility] and she is stable, will continue to monitor.

## 2020-01-02 NOTE — TOC Progression Note (Signed)
Transition of Care [facility]) - Progression Note    Patient Details  Name: Alisha [name] MRN: [id number] Date of Birth: 01/31/1951  Transition of Care [facility]) CM/SW [place], [place] Phone Number: 01/02/2020, 2:14 PM  Clinical Narrative:     CSW followed up with all bed acceptances in the hub to identify which facilities could take patient for long term care which are:  [facility]  CSW called patient's daughter [name] and gave her bed offers, she reports she will do some research and call CSW back once decision is made. CSW informed her of insurance auth process and need for choice in order to get insurance authorization. Valtracy expressed understanding.   Expected Discharge Plan: Skilled Nursing Facility Barriers to Discharge: Continued Medical Work up, Ship broker  Expected Discharge Plan and Services Expected Discharge Plan: [place] In-house Referral: Clinical Social Work Discharge Planning Services: CM Consult                                           Social Determinants of Health (SDOH) Interventions    Readmission Risk Interventions Readmission Risk Prevention Plan 12/08/2019  Transportation Screening Complete  PCP or Specialist Appt within 5-7 Days Not Complete  Not Complete comments plan for SNF  Home Care Screening Complete  Medication Review (RN CM) Referral to Pharmacy  Some recent data might be hidden

## 2020-01-02 NOTE — Progress Notes (Signed)
Physical Therapy Treatment Patient Details Name: Alisha [name] MRN: [id number] DOB: July 06, 1951 Today's Date: 01/02/2020    History of Present Illness 69 y.o female with fibromyalgia, bipolar, PAD, CAD, chronic venous insufficiency, and HTN who presented to the ED via EMS with left foot pain and swelling. Pt with recent SNF stay and d/c home (d/c on 5/4 and admitted to hospital 5/5). Per chart pt with hx of falls at [facility] ([place]). Imaging of L foot without acute changes, MRI revealed subcutaneous edema and Moderate tibiotalar osteoarthritis    PT Comments    Patient is making progress toward PT goals and reports little to no L ankle pain this session. Pt is able to stand with mod A +2 and take steps to recliner with min A +2 and RW. Pt continues to present with cognitive deficits and slow to process cues during session. Continue to progress as tolerated with anticipated d/c to SNF for further skilled PT services.     Follow Up Recommendations  SNF;Supervision/Assistance - 24 hour     Equipment Recommendations  Other (comment)(TBD pending progress)    Recommendations for Other Services       Precautions / Restrictions Precautions Precautions: Fall Precaution Comments: hx falls Restrictions Weight Bearing Restrictions: No    Mobility  Bed Mobility Overal bed mobility: Needs Assistance Bed Mobility: Supine to Sit     Supine to sit: Min guard;HOB elevated     General bed mobility comments: min guard for safety; increased time needed and use of rail; cues to complete task  Transfers Overall transfer level: Needs assistance Equipment used: Rolling walker (2 wheeled) Transfers: Sit to/from Stand Sit to Stand: +2 physical assistance;Mod assist         General transfer comment: assist to power up into standing; bed pad utilized for hip extension; cues for positioning and hand placement prior to stand  Ambulation/Gait Ambulation/Gait assistance: Min assist;+2 physical  assistance;+2 safety/equipment Gait Distance (Feet): 3 Feet Assistive device: Rolling walker (2 wheeled) Gait Pattern/deviations: Step-to pattern;Trunk flexed     General Gait Details: cues for sequencing and posture; assist to steady and manage RW especially when turning to sit in recliner; pt able to take side steps down length of bed and then take pivotal steps to recliner   Stairs             Wheelchair Mobility    Modified Rankin (Stroke Patients Only) Modified Rankin (Stroke Patients Only) Pre-Morbid Rankin Score: No symptoms Modified Rankin: Moderately severe disability     Balance Overall balance assessment: Needs assistance Sitting-balance support: Feet supported Sitting balance-Leahy Scale: Fair     Standing balance support: Bilateral upper extremity supported Standing balance-Leahy Scale: Poor                              Cognition Arousal/Alertness: Awake/alert Behavior During Therapy: WFL for tasks assessed/performed Overall Cognitive Status: Impaired/Different from baseline Area of Impairment: Orientation;Memory;Following commands;Safety/judgement;Awareness;Attention;Problem solving                 Orientation Level: Disoriented to;Time Current Attention Level: Sustained Memory: Decreased short-term memory Following Commands: Follows one step commands with increased time Safety/Judgement: Decreased awareness of safety;Decreased awareness of deficits Awareness: Intellectual Problem Solving: Decreased initiation;Difficulty sequencing;Requires verbal cues;Requires tactile cues;Slow processing        Exercises      General Comments        Pertinent Vitals/Pain Pain Assessment: No/denies pain  Home Living                      Prior Function            PT Goals (current goals can now be found in the care plan section) Progress towards PT goals: Progressing toward goals    Frequency    Min 3X/week       PT Plan Current plan remains appropriate    Co-evaluation              AM-PAC PT "6 Clicks" Mobility   Outcome Measure  Help needed turning from your back to your side while in a flat bed without using bedrails?: A Lot Help needed moving from lying on your back to sitting on the side of a flat bed without using bedrails?: A Lot Help needed moving to and from a bed to a chair (including a wheelchair)?: A Lot Help needed standing up from a chair using your arms (e.g., wheelchair or bedside chair)?: A Lot Help needed to walk in hospital room?: A Lot Help needed climbing 3-5 steps with a railing? : Total 6 Click Score: 11    End of Session Equipment Utilized During Treatment: Gait belt Activity Tolerance: Patient tolerated treatment well Patient left: with call bell/phone within reach;in chair;with chair alarm set Nurse Communication: Mobility status PT Visit Diagnosis: Other abnormalities of gait and mobility (R26.89);Muscle weakness (generalized) (M62.81);Pain Pain - Right/Left: Left Pain - part of body: Ankle and joints of foot     Time: 1422-1455 PT Time Calculation (min) (ACUTE ONLY): 33 min  Charges:  $Gait Training: 23-37 mins                     Earney Navy, PTA Acute Rehabilitation Services Pager: [phone] Office: [phone]     Darliss Cheney 01/02/2020, 4:14 PM

## 2020-01-02 NOTE — Progress Notes (Signed)
Subjective:   Pt was seen at the bedside today.  She is feeling much better today. Her breathing and neck pain are significantly improved. Foot pain is also better. Feeling jittery this morning. Cannot recall when her last BM was.   Objective: CBC Latest Ref Rng & Units 01/02/2020 01/01/2020 12/30/2019  WBC 4.0 - 10.5 K/uL 20.0(H) 15.4(H) 7.8  Hemoglobin 12.0 - 15.0 g/dL 9.4(L) 11.4(L) 12.0  Hematocrit 36.0 - 46.0 % 29.2(L) 34.9(L) 38.2  Platelets 150 - 400 K/uL 180 217 275   BMP Latest Ref Rng & Units 01/01/2020 12/31/2019 12/30/2019  Glucose 70 - 99 mg/dL 126(H) 145(H) 84  BUN 8 - 23 mg/dL 15 22 26(H)  Creatinine 0.44 - 1.00 mg/dL 1.48(H) 1.60(H) 1.79(H)  BUN/Creat Ratio 12 - 28 - - -  Sodium 135 - 145 mmol/L 149(H) 145 143  Potassium 3.5 - 5.1 mmol/L 4.0 4.9 4.4  Chloride 98 - 111 mmol/L 114(H) 116(H) 112(H)  CO2 22 - 32 mmol/L 22 24 23   Calcium 8.9 - 10.3 mg/dL 9.0 9.3 9.3   Vital signs in last 24 hours: Vitals:   01/01/20 2136 01/01/20 2242 01/01/20 2313 01/02/20 0344  BP: 111/60 128/60 (!) 155/72 (!) 123/46  Pulse: 86  73 63  Resp: 20  20 20   Temp: 97.7 F (36.5 C)  97.8 F (36.6 C) 97.9 F (36.6 C)  TempSrc: Oral  Oral Oral  SpO2: 98% 98% 100%   Weight:       Physical Exam General: Well-appearing, NAD HEENT: NCAT Neck: Right submandibular gland swollen and firm to palpation, nontender CV: RRR, normal S1 and S2 no murmurs rubs or gallops appreciated PULM: Work of breathing, clear to auscultation bilaterally all lung fields ABD: Soft and nontender NEURO: Alert, appears jittery, nonfocal  Assessment/Plan:  Active Problems:   AKI (acute kidney injury) ([place])   Urinary retention   Submandibular gland infection  In summary, Alisha [name] is a 69 y.o female with fibromyalgia, bipolar, PAD, CAD, chronic venous insufficiency, and HTN who presented to the ED via EMS with acute onset left foot pain and AKI.  Overnight, the patient developed rapid onset of neck swelling and  pharyngeal exudate causing difficulties with breathing.   #Neck Swelling #R submandibular gland infection #Cellulitis: Stat CT neck & soft tissue w/ contrast obtained on 5/7 demonstrated enlargement of the right submandibular gland with surrounding soft tissue edema.  No abscess seen.  -ENT consulted and we appreciate their recommendations  -Frequent use of heat and massage to the gland  -Use sialagogues (sour candy) to promote saliva flow -ID has been consulted, appreciate recommendations   -Contniue IV Unasyn (day 2) -D/c decadron today, pt has no respiratory issues and feels jittery, likely from steroids -D/c dilaudid  #AKI: Improving. Creatinine was elevated to 2.43 on admission. Now 1.42 today. -Strict I/Os -Hold anticholinergic medications -Avoid nephrotoxic medications like diuretics -Daily BMP   #L foot pain: Patient was exquisitely tender to palpation of her left ankle. X-rays menstruated hardware from previous ankle fusion surgery but no fractures.   MRI L ankle demonstrated diffuse subcutaneous edema without IV fluid collection.  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 Chronic ATFL tear. Pt has less foot pain today, likely due to high dose steroids. Will have her work with PT today  -Oxycodone IR 5 mg q12hrs PRN -Tylenol 650 mg q6hrs PRN  #PAD #CAD #HTN -Aspirin 81 mg daily -Plavix 75 mg daily -Atorvastatin 80 mg daily   #  Chronic venous insufficiency -Continue wraps and elevation   #T2DM: Pt takes metformin at home -Hold home metformin  -SSI  #FEN/GI -Diet: heart healthy -Fluids: LR 100 cc/hr -Protonix 40 mg daily -Zofran 4 mg every 6 hours as needed -MiraLAX daily   #DVT prophylaxis -Lovenox 40 mg subq injections daily  #CODE STATUS: FULL  #Dispo: Anticipated discharge in approximately 1-2 days Prior to Admission Living Arrangement: Home Anticipated Discharge Location: Home or SNF Barriers  to Discharge: Ongoing medical workup, PT/OT evaluation  Earlene Plater, MD Internal Medicine, PGY1 Pager: [phone]  01/02/2020,6:09 AM

## 2020-01-03 LAB — GLUCOSE, CAPILLARY
Glucose-Capillary: 65 mg/dL — ABNORMAL LOW (ref 70–99)
Glucose-Capillary: 73 mg/dL (ref 70–99)
Glucose-Capillary: 73 mg/dL (ref 70–99)
Glucose-Capillary: 80 mg/dL (ref 70–99)
Glucose-Capillary: 81 mg/dL (ref 70–99)
Glucose-Capillary: 88 mg/dL (ref 70–99)
Glucose-Capillary: 90 mg/dL (ref 70–99)
Glucose-Capillary: 94 mg/dL (ref 70–99)

## 2020-01-03 LAB — CBC
HCT: 29.6 % — ABNORMAL LOW (ref 36.0–46.0)
Hemoglobin: 9.7 g/dL — ABNORMAL LOW (ref 12.0–15.0)
MCH: 29.2 pg (ref 26.0–34.0)
MCHC: 32.8 g/dL (ref 30.0–36.0)
MCV: 89.2 fL (ref 80.0–100.0)
Platelets: 174 10*3/uL (ref 150–400)
RBC: 3.32 MIL/uL — ABNORMAL LOW (ref 3.87–5.11)
RDW: 15.8 % — ABNORMAL HIGH (ref 11.5–15.5)
WBC: 19.5 10*3/uL — ABNORMAL HIGH (ref 4.0–10.5)
nRBC: 0 % (ref 0.0–0.2)

## 2020-01-03 LAB — BASIC METABOLIC PANEL
Anion gap: 9 (ref 5–15)
BUN: 19 mg/dL (ref 8–23)
CO2: 23 mmol/L (ref 22–32)
Calcium: 8.3 mg/dL — ABNORMAL LOW (ref 8.9–10.3)
Chloride: 112 mmol/L — ABNORMAL HIGH (ref 98–111)
Creatinine, Ser: 1.23 mg/dL — ABNORMAL HIGH (ref 0.44–1.00)
GFR calc Af Amer: 51 mL/min — ABNORMAL LOW (ref >60)
GFR calc non Af Amer: 44 mL/min — ABNORMAL LOW (ref >60)
Glucose, Bld: 99 mg/dL (ref 70–99)
Potassium: 2.9 mmol/L — ABNORMAL LOW (ref 3.5–5.1)
Sodium: 144 mmol/L (ref 135–145)

## 2020-01-03 LAB — SARS CORONAVIRUS 2 (TAT 6-24 HRS): SARS Coronavirus 2: NEGATIVE

## 2020-01-03 MED ORDER — POTASSIUM CHLORIDE CRYS ER 20 MEQ PO TBCR
40.0000 meq | EXTENDED_RELEASE_TABLET | Freq: Two times a day (BID) | ORAL | Status: AC
  Administered 2020-01-03 – 2020-01-04 (×3): 40 meq via ORAL
  Filled 2020-01-03: qty 2
  Filled 2020-01-03: qty 4
  Filled 2020-01-03: qty 2

## 2020-01-03 NOTE — Progress Notes (Signed)
Patient Hypoglycemic, CBG reading 65. Patient assessed and is asymptomatic. Hypoglycemic protocol initiated. MD notified. Will continue to monitor. Gwendolyn Grant, RN

## 2020-01-03 NOTE — Progress Notes (Signed)
Subjective:  Patient evaluated at bedside this morning. She states that she is doing fine. She states that her neck was still sore, but doing better. She states that her foot is doing well, she went to the bathroom by herself, but she got dizzy when she stood up, and needed assistance traversing to the toilet. She is able to bear weight on her affected limb. She endorses good PO intake.   Objective:  Vital signs in last 24 hours: Vitals:   01/02/20 2029 01/02/20 2100 01/03/20 0026 01/03/20 0445  BP: 99/84 99/84 (!) 155/80 (!) 147/71  Pulse: 65 72 79 73  Resp: 16 20 17 17   Temp: 97.8 F (36.6 C)  97.9 F (36.6 C) 97.6 F (36.4 C)  TempSrc: Oral  Oral Axillary  SpO2: 100% 100% 100% 100%  Weight:       General: awake, alert, sitting up in bed in NAD Neck: right submandibular gland swollen, tender to palpation CV: RRR; no m/r/g Pulm: normal work of breathing; lungs CTA  Abd: Soft, non-tender  Assessment/Plan:  Active Problems:   AKI (acute kidney injury) ([place])   Urinary retention   Submandibular gland infection  In summary, Ms. Fiorito is a 69 y.o female with fibromyalgia, bipolar, PAD, CAD, chronic venous insufficiency, and HTN who presented to the ED via EMS withacute onset left foot pain and AKI.  Hospital course complicated by right submandibular gland infection.   #Neck Swelling #R submandibular gland infection #Cellulitis: Stat CT neck & soft tissue w/ contrast obtained on 5/7 demonstrated enlargement of the right submandibular gland with surrounding soft tissue edema. No abscess seen.  -appreciate ENT evaluation; recommended frequent heat and massage, as well as sialagogues (sour candy) to promote saliva flow  -ID also evaluated and recommended antibiotics and steroids has been consulted, appreciate recommendations              -Contniue IV Unasyn (day 3)  -decadron discontinued due to signs of steroid-induced psychosis -fairly unchanged on exam today; would prefer to  avoid steroids if possible so will continue to monitor on Abx alone. Encouraged her to continue sucking on candy as much as possible to stimulate the gland.    #AKI: Improving.  - etiology multifactorial. She has pre-renal etiology due to NSAID use, as well as post renal due to urinary retention.  -Creatinine was elevated to 2.43 on admission; continues to improve after placement of foley and giving IVF; Now 1.2 today. -Strict I/Os -Hold anticholinergic medications -Avoid nephrotoxic medications  -continue LR @ 100 cc/hr -Daily BMP   #L foot pain: Patient was exquisitely tender to palpation of her left ankle. X-rays menstruated hardware from previous ankle fusion surgery but no fractures.   MRI L ankle demonstrated diffuse subcutaneous edema without IV fluid collection.  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 Chronic ATFL tear.  -Pain significantly improved after high dose steroids -continue PT efforts  -Oxycodone IR 5 mg q12hrs PRN -Tylenol 650 mg q6hrs PRN  #PAD #CAD #HTN -Aspirin 81 mg daily -Plavix 75 mg daily -Atorvastatin 80 mg daily   #Chronic venous insufficiency -Continue wraps and elevation   #T2DM: Pt takes metformin at home -Hold home metformin  -SSI  #FEN/GI -Diet: heart healthy -Fluids: LR 100 cc/hr -Protonix 40 mg daily -Zofran 4 mg every 6 hours as needed -MiraLAX daily   #DVT prophylaxis -Lovenox 40 mg subq injections daily  #CODE STATUS: FULL  Prior to Admission Living Arrangement: Home  Anticipated Discharge Location: Home vs SNF Barriers to Discharge: continued medical treatment Dispo: Anticipated discharge in approximately 1-2 day(s).   Modena Nunnery D, DO 01/03/2020, 5:57 AM Pager: [phone]

## 2020-01-03 NOTE — Progress Notes (Signed)
CBG rechecked reading of 73. Patient now eating breakfast. Will continue to monitor.  Gwendolyn Grant, RN

## 2020-01-03 NOTE — TOC Progression Note (Signed)
Transition of Care [facility]) - Progression Note    Patient Details  Name: Alisha [name] MRN: [id number] Date of Birth: 21-Sep-1950  [facility] CM/SW [place], [place] Phone Number: 01/03/2020, 1:18 PM  Clinical Narrative:    CSW contacted patient's daughter [name] to follow-up on bed choice. CSW left voicemail, awaiting a callback.   Expected Discharge Plan: Skilled Nursing Facility Barriers to Discharge: Continued Medical Work up, Ship broker  Expected Discharge Plan and Services Expected Discharge Plan: [place] In-house Referral: Clinical Social Work Discharge Planning Services: CM Consult                                           Social Determinants of Health (SDOH) Interventions    Readmission Risk Interventions Readmission Risk Prevention Plan 12/08/2019  Transportation Screening Complete  PCP or Specialist Appt within 5-7 Days Not Complete  Not Complete comments plan for SNF  Home Care Screening Complete  Medication Review (RN CM) Referral to Pharmacy  Some recent data might be hidden

## 2020-01-04 LAB — GLUCOSE, CAPILLARY: Glucose-Capillary: 76 mg/dL (ref 70–99)

## 2020-01-04 LAB — MAGNESIUM: Magnesium: 0.9 mg/dL — CL (ref 1.7–2.4)

## 2020-01-04 LAB — CBC
HCT: 30.6 % — ABNORMAL LOW (ref 36.0–46.0)
Hemoglobin: 10.1 g/dL — ABNORMAL LOW (ref 12.0–15.0)
MCH: 29.3 pg (ref 26.0–34.0)
MCHC: 33 g/dL (ref 30.0–36.0)
MCV: 88.7 fL (ref 80.0–100.0)
Platelets: 166 10*3/uL (ref 150–400)
RBC: 3.45 MIL/uL — ABNORMAL LOW (ref 3.87–5.11)
RDW: 16 % — ABNORMAL HIGH (ref 11.5–15.5)
WBC: 13.7 10*3/uL — ABNORMAL HIGH (ref 4.0–10.5)
nRBC: 0 % (ref 0.0–0.2)

## 2020-01-04 LAB — BASIC METABOLIC PANEL
Anion gap: 6 (ref 5–15)
BUN: 9 mg/dL (ref 8–23)
CO2: 22 mmol/L (ref 22–32)
Calcium: 8 mg/dL — ABNORMAL LOW (ref 8.9–10.3)
Chloride: 117 mmol/L — ABNORMAL HIGH (ref 98–111)
Creatinine, Ser: 1.08 mg/dL — ABNORMAL HIGH (ref 0.44–1.00)
GFR calc Af Amer: >60 mL/min (ref >60)
GFR calc non Af Amer: 52 mL/min — ABNORMAL LOW (ref >60)
Glucose, Bld: 84 mg/dL (ref 70–99)
Potassium: 3.2 mmol/L — ABNORMAL LOW (ref 3.5–5.1)
Sodium: 145 mmol/L (ref 135–145)

## 2020-01-04 LAB — SARS CORONAVIRUS 2 (TAT 6-24 HRS): SARS Coronavirus 2: NEGATIVE

## 2020-01-04 MED ORDER — PROMETHAZINE HCL 25 MG/ML IJ SOLN
12.5000 mg | Freq: Once | INTRAMUSCULAR | Status: AC
  Administered 2020-01-04: 10:00:00 12.5 mg via INTRAVENOUS
  Filled 2020-01-04: qty 1

## 2020-01-04 MED ORDER — MAGNESIUM SULFATE 4 GM/100ML IV SOLN
4.0000 g | Freq: Once | INTRAVENOUS | Status: AC
  Administered 2020-01-04: 4 g via INTRAVENOUS
  Filled 2020-01-04: qty 100

## 2020-01-04 MED ORDER — POTASSIUM CHLORIDE CRYS ER 20 MEQ PO TBCR
40.0000 meq | EXTENDED_RELEASE_TABLET | Freq: Once | ORAL | Status: AC
  Administered 2020-01-04: 08:00:00 40 meq via ORAL
  Filled 2020-01-04: qty 2

## 2020-01-04 NOTE — Plan of Care (Signed)

## 2020-01-04 NOTE — Progress Notes (Addendum)
Subjective:   Patient evaluated at bedside this morning.  Patient states she has some tenderness to the gland in her neck, but overall feels well.  She denies any difficulty breathing or swallowing foods.  She is appreciative of the sour candy.  No complaints at this time.  She is amendable to going back to SNF if needed, but will prefer not to go back to [facility].  Objective:  Vital signs in last 24 hours: Vitals:   01/03/20 1900 01/03/20 1955 01/03/20 2355 01/04/20 0408  BP: (!) 151/81 (!) 150/92 (!) 154/94 (!) 166/95  Pulse: 86 74 88 74  Resp: (!) 32 18 17 18   Temp:  97.6 F (36.4 C) 97.7 F (36.5 C) 98.1 F (36.7 C)  TempSrc:  Oral Oral Oral  SpO2: 100% 100% 100% 97%  Weight:       Physical Exam General: Well-appearing, sitting up in bed watching TV HEENT: Right submandibular gland swelling, firm and tenderness to mild palpation CV: RRR, normal S1 and S2 no murmurs rubs or gallops appreciated PULM: Clear to auscultation bilaterally ABD: Soft and nontender  Assessment/Plan:  Active Problems:   AKI (acute kidney injury) ([place])   Urinary retention   Submandibular gland infection  In summary, Alisha [name] is a 69 y.o female with fibromyalgia, bipolar, PAD, CAD, chronic venous insufficiency, and HTN who presented to the ED via EMS withacute onset left foot pain and AKI.  Hospital course complicated by right submandibular gland infection.   #Neck Swelling #R submandibular gland infection #Cellulitis: Stat CT neck & soft tissue w/ contrast obtained on 5/7 demonstrated enlargement of the right submandibular gland with surrounding soft tissue edema. No abscess seen.  -appreciate ENT evaluation; recommended frequent heat and massage, as well as sialagogues (sour candy) to promote saliva flow  -ID also evaluated and recommended antibiotics and steroids has been consulted, appreciate recommendations              -Contniue IV Unasyn (day 4) -Encouraged her to continue sucking  on candy as much as possible to stimulate the gland.    #AKI: Improving. Cr. 1.02 today from 2.43 on admission. Etiology multifactorial. She has pre-renal etiology due to NSAID use, as well as post renal due to urinary retention. -Strict I/Os -Hold anticholinergic medications -Avoid nephrotoxic medications  -continue LR @ 100 cc/hr -Daily BMP   #L foot pain: Patient was exquisitely tender to palpation of her left ankle. X-rays menstruated hardware from previous ankle fusion surgery but no fractures.  MRI L ankle demonstrated diffuse subcutaneous edema without IV fluid collection.  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 Chronic ATFL tear.  -Pain significantly improved after high dose steroids -Continue PT -Oxycodone IR 5 mg q12hrs PRN -Tylenol 650 mg q6hrs PRN  #PAD #CAD #HTN -Aspirin 81 mg daily -Plavix 75 mg daily -Atorvastatin 80 mg daily   #Chronic venous insufficiency -Continue wraps and elevation   #T2DM: Pt takes metformin at home -Hold home metformin  -SSI  #FEN/GI #Hypokalemia #Hypomagnesemia:  -Diet: heart healthy -Fluids: LR 100 cc/hr -Kdur tablet x 3 -IV magnesium sulfate 4 g -Protonix 40 mg daily -Zofran 4 mg every 6 hours as needed -MiraLAX daily   #DVT prophylaxis -Lovenox 40 mg subq injections daily  #CODE STATUS: FULL  #Dispo: Anticipated discharge in approximately 1-2 day(s).  Prior to Admission Living Arrangement: Home Anticipated Discharge Location: Home vs SNF Barriers to Discharge: Ongoing medical management   Alisha Plater, MD Internal  Medicine, PGY1 Pager: [phone]  01/04/2020,6:59 AM

## 2020-01-04 NOTE — Progress Notes (Signed)
Pharmacy Antibiotic Note  Alisha [name] is a 69 y.o. female admitted on 12/30/2019 with L foot pain after fall PTA, hx ankle surgery in the past. Pharmacy has been consulted for Unasyn dosing for retropharyngeal abscess.  AKI on admit, now at baseline.    Today is abx day #4.  Plan: Unasyn 3gm IV q8h. Follow renal function, culture data, CT, clinical progress and antibiotic plans.   Weight: 122 kg (268 lb 15.4 oz)  Temp (24hrs), Avg:97.9 F (36.6 C), Min:97.6 F (36.4 C), Max:98.1 F (36.7 C)  Recent Labs  Lab 12/30/19 0833 12/30/19 2106 12/31/19 0742 01/01/20 1213 01/01/20 1600 01/02/20 0514 01/02/20 0758 01/03/20 0319 01/04/20 0444  WBC 7.8  --   --  15.4*  --  20.0*  --  19.5* 13.7*  CREATININE 2.43*   < > 1.60* 1.48*  --   --  1.42* 1.23* 1.08*  LATICACIDVEN 1.0  --   --  1.1 0.9  --   --   --   --    < > = values in this interval not displayed.    Estimated Creatinine Clearance: 64.6 mL/min (A) (by C-G formula based on SCr of 1.08 mg/dL (H)).    Allergies  Allergen Reactions  . Ibuprofen     Other reaction(s): Upset Stomach  . Ampicillin Nausea Only  . Asa [Aspirin] Diarrhea    Other reaction(s): Upset Stomach  . Darvon [Propoxyphene] Nausea And Vomiting  . Demeclocycline Other (See Comments)    Nerves feeling  . Eggs Or Egg-Derived Products Diarrhea  . Lactalbumin Diarrhea  . Lisinopril Cough  . Milk-Related Compounds Diarrhea  . Salicylates Other (See Comments)    Unknown Unknown   . Tetracycline Hcl Hives and Nausea Only  . Tetracyclines & Related Other (See Comments)    Nerves feeling     Antimicrobials this admission:  Unasyn 5/6 >>  Dose adjustments this admission:  n/a  Microbiology results:  5/6 blood x 2: ngtd  5/4 COVID and flu: negative  Thank you for allowing pharmacy to be a part of this patient's care.  [employer], Pharm.D., BCPS Clinical Pharmacist Clinical phone for 01/04/2020 from 8:30-4:00 is  [phone].  **Pharmacist phone directory can be found on [email or web address] listed under [place].  01/04/2020 8:12 AM

## 2020-01-04 NOTE — TOC Progression Note (Addendum)
Transition of Care [facility]) - Progression Note    Patient Details  Name: Alisha [name] MRN: [id number] Date of Birth: [birth year]  Transition of Care [facility]) CM/SW [place], [place] Phone Number: 01/04/2020, 10:00 AM  Clinical Narrative:    CSW attempted to contact patient's daughter to get bed choice, left a voicemail. CSW awaiting a callback.   CSW met bedside with patient, introduced self and role. CSW discussed bed offers and asked patient if she is aware of a choice. Patient expressed she does not know which facility and that she has not spoken to her daughter since they were last at the hospital. CSW agreed to follow-up.   Started Ship broker, reference [id number].   Expected Discharge Plan: Skilled Nursing Facility Barriers to Discharge: Continued Medical Work up, Ship broker  Expected Discharge Plan and Services Expected Discharge Plan: [place] In-house Referral: Clinical Social Work Discharge Planning Services: CM Consult                                           Social Determinants of Health (SDOH) Interventions    Readmission Risk Interventions Readmission Risk Prevention Plan 12/08/2019  Transportation Screening Complete  PCP or Specialist Appt within 5-7 Days Not Complete  Not Complete comments plan for SNF  Home Care Screening Complete  Medication Review (RN CM) Referral to Pharmacy  Some recent data might be hidden

## 2020-01-04 NOTE — Progress Notes (Signed)
Nurse notified of critical Magnesium level. Md made aware by [place], [facility]. New orders implemented.  Gwendolyn Grant, RN

## 2020-01-05 DIAGNOSIS — W19XXXA Unspecified fall, initial encounter: Secondary | ICD-10-CM

## 2020-01-05 LAB — CBC
HCT: 31.9 % — ABNORMAL LOW (ref 36.0–46.0)
Hemoglobin: 10.6 g/dL — ABNORMAL LOW (ref 12.0–15.0)
MCH: 29.3 pg (ref 26.0–34.0)
MCHC: 33.2 g/dL (ref 30.0–36.0)
MCV: 88.1 fL (ref 80.0–100.0)
Platelets: 183 10*3/uL (ref 150–400)
RBC: 3.62 MIL/uL — ABNORMAL LOW (ref 3.87–5.11)
RDW: 15.9 % — ABNORMAL HIGH (ref 11.5–15.5)
WBC: 11.5 10*3/uL — ABNORMAL HIGH (ref 4.0–10.5)
nRBC: 0 % (ref 0.0–0.2)

## 2020-01-05 LAB — BASIC METABOLIC PANEL
Anion gap: 11 (ref 5–15)
BUN: 8 mg/dL (ref 8–23)
CO2: 18 mmol/L — ABNORMAL LOW (ref 22–32)
Calcium: 8.2 mg/dL — ABNORMAL LOW (ref 8.9–10.3)
Chloride: 114 mmol/L — ABNORMAL HIGH (ref 98–111)
Creatinine, Ser: 0.84 mg/dL (ref 0.44–1.00)
GFR calc Af Amer: >60 mL/min (ref >60)
GFR calc non Af Amer: >60 mL/min (ref >60)
Glucose, Bld: 94 mg/dL (ref 70–99)
Potassium: 3.2 mmol/L — ABNORMAL LOW (ref 3.5–5.1)
Sodium: 143 mmol/L (ref 135–145)

## 2020-01-05 LAB — MAGNESIUM: Magnesium: 1.5 mg/dL — ABNORMAL LOW (ref 1.7–2.4)

## 2020-01-05 MED ORDER — AMOXICILLIN-POT CLAVULANATE 875-125 MG PO TABS: 1.0000 | ORAL_TABLET | Freq: Two times a day (BID) | ORAL | 0 refills | Status: AC

## 2020-01-05 MED ORDER — AMOXICILLIN-POT CLAVULANATE 875-125 MG PO TABS: 1.0000 | ORAL_TABLET | Freq: Two times a day (BID) | ORAL | Status: DC

## 2020-01-05 MED ORDER — MAGNESIUM SULFATE 2 GM/50ML IV SOLN
2.0000 g | Freq: Once | INTRAVENOUS | Status: AC
  Administered 2020-01-05: 2 g via INTRAVENOUS
  Filled 2020-01-05: qty 50

## 2020-01-05 MED ORDER — POTASSIUM CHLORIDE CRYS ER 20 MEQ PO TBCR
40.0000 meq | EXTENDED_RELEASE_TABLET | Freq: Once | ORAL | Status: AC
  Administered 2020-01-05: 13:00:00 40 meq via ORAL
  Filled 2020-01-05: qty 2

## 2020-01-05 MED ORDER — LOSARTAN POTASSIUM 50 MG PO TABS
100.0000 mg | ORAL_TABLET | Freq: Every day | ORAL | Status: DC
  Administered 2020-01-05: 100 mg via ORAL
  Filled 2020-01-05: qty 2

## 2020-01-05 MED ORDER — POTASSIUM CHLORIDE CRYS ER 20 MEQ PO TBCR
40.0000 meq | EXTENDED_RELEASE_TABLET | Freq: Once | ORAL | Status: AC
  Administered 2020-01-05: 10:00:00 40 meq via ORAL
  Filled 2020-01-05: qty 2

## 2020-01-05 NOTE — Discharge Summary (Signed)
Name: Alisha [name] MRN: [id number] DOB: [birth year] 69 y.o. PCP: Sonia Side., FNP  Date of Admission: 12/30/2019  7:33 AM Date of Discharge: 01/05/2020 Attending Physician: Velna Ochs, MD  Discharge Diagnosis: 1. Right submandibular  Sialadentitis/cellulitis 2. AKI 3. L foot pain 4.  Hypokalemia/hypomagnesemia  Discharge Medications: Allergies as of 01/05/2020      Reactions   Ibuprofen    Other reaction(s): Upset Stomach   Ampicillin Nausea Only   Asa [aspirin] Diarrhea   Other reaction(s): Upset Stomach   Darvon [propoxyphene] Nausea And Vomiting   Demeclocycline Other (See Comments)   Nerves feeling   Eggs Or Egg-derived Products Diarrhea   Lactalbumin Diarrhea   Lisinopril Cough   Milk-related Compounds Diarrhea   Salicylates Other (See Comments)   Unknown Unknown   Tetracycline Hcl Hives, Nausea Only   Tetracyclines & Related Other (See Comments)   Nerves feeling      Medication List    STOP taking these medications   amitriptyline 50 MG tablet Commonly known as: ELAVIL   cyclobenzaprine 5 MG tablet Commonly known as: FLEXERIL   flurbiprofen 100 MG tablet Commonly known as: ANSAID   oxybutynin 15 MG 24 hr tablet Commonly known as: DITROPAN XL   OXYGEN   tiZANidine 4 MG tablet Commonly known as: ZANAFLEX     TAKE these medications   Ventolin HFA 108 (90 Base) MCG/ACT inhaler Generic drug: albuterol Inhale 1 puff into the lungs every 6 (six) hours as needed for wheezing or shortness of breath.   albuterol (2.5 MG/3ML) 0.083% nebulizer solution Commonly known as: PROVENTIL Take 3 mLs (2.5 mg total) by nebulization every 6 (six) hours as needed for wheezing or shortness of breath.   amoxicillin-clavulanate 875-125 MG tablet Commonly known as: AUGMENTIN Take 1 tablet by mouth every 12 (twelve) hours for 9 days.   aspirin EC 81 MG tablet Take 81 mg by mouth daily.   atorvastatin 80 MG tablet Commonly known as:  LIPITOR Take 80 mg by mouth at bedtime.   budesonide-formoterol 80-4.5 MCG/ACT inhaler Commonly known as: Symbicort Inhale 2 puffs into the lungs 2 (two) times daily.   clopidogrel 75 MG tablet Commonly known as: PLAVIX Take 1 tablet (75 mg total) by mouth daily.   DULoxetine 60 MG capsule Commonly known as: Cymbalta Take 1 capsule (60 mg total) by mouth daily.   gabapentin 300 MG capsule Commonly known as: NEURONTIN Take 300 mg by mouth 3 (three) times daily.   loratadine 10 MG tablet Commonly known as: CLARITIN Take 10 mg by mouth daily.   losartan 100 MG tablet Commonly known as: COZAAR Take 1 tablet (100 mg total) daily by mouth.   metFORMIN 500 MG tablet Commonly known as: GLUCOPHAGE Take 500 mg by mouth 2 (two) times daily with a meal.   Myrbetriq 50 MG Tb24 tablet Generic drug: mirabegron ER Take 50 mg by mouth daily.   ondansetron 4 MG tablet Commonly known as: ZOFRAN Take 4 mg by mouth every 6 (six) hours as needed for nausea or vomiting.   pantoprazole 40 MG tablet Commonly known as: PROTONIX Take 40 mg by mouth daily.   Spiriva Respimat 1.25 MCG/ACT Aers Generic drug: Tiotropium Bromide Monohydrate Inhale 2 puffs into the lungs daily.   topiramate 50 MG tablet Commonly known as: TOPAMAX Take 50 mg by mouth at bedtime.      Disposition and follow-up:   [name] was discharged from [facility] in [place] condition.  At the hospital follow up visit please address:  1. Right submandibular  Sialadentitis/cellulitis: Stat CT neck & soft tissue w/ contrast was obtained on 5/7 and demonstrated enlargement of the right submandibular gland with surrounding soft tissue cellulitis. No abscess.  Patient was having respiratory difficulties so ENT was urgently consulted and recommended IV antibiotics, heat packs, gentle massages and sialagogues (sour candy).  O2 saturations remained greater than 95% on room air without  intervention.  Infectious disease also was consulted and recommended Unasyn and IV Decadron.  Patient responded well over the next 2 days, so steroids were discontinued while antibiotics were continued.  Transitioned to p.o. on day of discharge. -Augmentin 875-125 mg every 12 hours to complete 14-day course of antibiotics (stop date May 19th)  2. AKI: Creatinine elevated to 2.43 on admission.  Etiology multifactorial.  Patient likely had prerenal etiology due to NSAID use and poor p.o. intake in addition to urinary retention.  Patient had a Foley placed and received IV fluids and demonstrated good urine output.  Foley was discontinued the day prior to discharge, and successfully voided her bladder without any issues.  Creatinine was normal on the day of discharge. -Continue to hold anticholinergic medications -Restarted home losartan 100 mg daily.  This was held in the setting of AKI.  3. L foot pain: Patient initially presented with exquisite tenderness to the left ankle.  X-rays were negative for any acute fractures, but did show hardware from previous fusion surgeries.  MRI left ankle demonstrated diffuse subcutaneous edema without fluid collection.  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  Chronic ATFL tear.  Patient's foot pain dramatically improved with IV steroids, and had greater mobility on the day of discharge. -Continue PT at SNF  4.  Hypokalemia/hypomagnesemia: Patient's magnesium was at 1.5 and potassium 3.2 on the day of discharge.  She received IV magnesium and p.o. potassium repletion -Repeat BMP and magnesium level   5.  Labs / imaging needed at time of follow-up: Magnesium, BMP  6.  Pending labs/ test needing follow-up: None  Follow-up Appointments: Contact information for after-discharge care    Destination    [facility] .   Service: Skilled Nursing Contact information: [place] Hospital Course by problem list: 1. Right submandibular  Sialadentitis/cellulitis 2. AKI 3. L foot pain 4. Hypokalemia/hypomagnesemia  In summary, [name] is a 69-year-old female with a history of fibromyalgia, bipolar disorder, PAD, CAD, chronic venous insufficiency and hypertension who presented initially to the ED with acute onset left foot pain in the setting of a fall a few days prior as well as an AKI (Cr 2.43).  Overnight, the patient developed increased swelling in her neck to the point where it was difficult to swallow and breathe the next morning.  The patient had exquisite tenderness to the right submandibular region and massive swelling at the neck.  A stat CT neck and soft tissue with contrast was obtained which demonstrated enlargement of the right submandibular gland with and surrounding cellulitis.  ENT was urgently consulted, who recommended IV antibiotics, heat and sialagogues (sour candy) to promote saliva flow.  Infectious disease recommended starting Unasyn and Decadron for the inflammation.  The patient responded dramatically and quickly.  Although she is continue to have some tenderness in the submandibular region, she was breathing much easier and was able to swallow  food with ease.  Additionally, her left foot pain had also resolved.  The patient received 2 days of IV steroids and this was stopped given the dramatic improvement.  The patient received 5 days of Unasyn with transition to p.o. Augmentin with the plan to continue antibiotics to complete a 14-day course (stop date May 19th).  The patient received IV fluids throughout her stay and had resolution of her AKI back to normal renal function.  Of note, the patient had mild hypomagnesemia and hypokalemia on the day of discharge.  Her magnesium level was low at 1.5 and potassium of 3.4.  She received repletion of these electrolytes with a plan to repeat BMP  and magnesium level at her facility upon discharge.  Discharge Vitals:   BP 131/78 (BP Location: Right Arm)   Pulse 80   Temp 97.9 F (36.6 C) (Oral)   Resp 17   Wt 122 kg   SpO2 97%   BMI 43.41 kg/m   Pertinent Labs, Studies, and Procedures:  CBC Latest Ref Rng & Units 01/05/2020 01/04/2020 01/03/2020  WBC 4.0 - 10.5 K/uL 11.5(H) 13.7(H) 19.5(H)  Hemoglobin 12.0 - 15.0 g/dL 10.6(L) 10.1(L) 9.7(L)  Hematocrit 36.0 - 46.0 % 31.9(L) 30.6(L) 29.6(L)  Platelets 150 - 400 K/uL 183 166 174   BMP Latest Ref Rng & Units 01/05/2020 01/04/2020 01/03/2020  Glucose 70 - 99 mg/dL 94 84 99  BUN 8 - 23 mg/dL 8 9 19   Creatinine 0.44 - 1.00 mg/dL 0.84 1.08(H) 1.23(H)  BUN/Creat Ratio 12 - 28 - - -  Sodium 135 - 145 mmol/L 143 145 144  Potassium 3.5 - 5.1 mmol/L 3.2(L) 3.2(L) 2.9(L)  Chloride 98 - 111 mmol/L 114(H) 117(H) 112(H)  CO2 22 - 32 mmol/L 18(L) 22 23  Calcium 8.9 - 10.3 mg/dL 8.2(L) 8.0(L) 8.3(L)    Ref Range & Units 03:34 1 d ago  Magnesium 1.7 - 2.4 mg/dL 1.5Low   0.9Low Panic  CM    MRI L ankle w/o contrast: IMPRESSION: 1. Diffuse subcutaneous edema of the visualized lower leg without a well-defined fluid collection. 2. 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 4. Postsurgical changes from midfoot arthrodesis with solid fusion of the subtalar joint, calcaneocuboid joint, and talonavicular joint.  CT Soft Tissue Neck: IMPRESSION: 1. Enlargement right submandibular gland with hyperenhancement and extensive surrounding soft tissue edema. No obstructing stone and no abscess. Findings most compatible with acute infection of the submandibular gland with surrounding cellulitis.  CT Head: IMPRESSION: 1. No evidence of acute intracranial abnormality. 2. 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 3. Stable chronic small vessel ischemic changes within the cerebral white matter. 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 4. Large right maxillary sinus mucous retention cyst. 5. Left sphenoid sinusitis.  Discharge Instructions: Discharge Instructions    Call MD for:  difficulty breathing, headache or visual disturbances   Complete by: As directed    Call MD for:  extreme fatigue   Complete by: As directed    Call MD for:  hives   Complete by: As directed    Call MD for:  persistant dizziness or light-headedness   Complete by: As directed    Call MD for:  persistant nausea and vomiting   Complete by: As directed    Call MD for:  redness, tenderness, or signs of infection (pain, swelling, redness, odor or green/yellow discharge around incision site)   Complete by: As directed    Call MD for:  severe uncontrolled pain   Complete by: As directed    Call MD for:  temperature >100.4   Complete by: As directed    Diet - low sodium heart healthy   Complete by: As directed    Discharge instructions   Complete by: As directed    Thank you for allowing [place] to take care of you during your hospitalization.  Below is a summary of what we treated:  1.  Submandibular gland infection -We treated this with IV antibiotics for 5 days.  Start taking Augmentin every 12 hours for the next 9 days to complete a 14-day course of antibiotics. -Continue to suck on sour candies to stimulate the gland -Continue to use warm compress and soft massages to stimulate the gland.  2.  Acute kidney injury -We think this was likely secondary to using medications like ibuprofen a little too often as well as dehydration.  We gave you IV fluids and her kidney function is back to normal -Try your best to stay hydrated.  3.  Foot pain -The MRI of your foot showed generalized swelling, but it appears after getting some steroids that the pain is a lot  better. -You will continue to get physical therapy at the skilled nursing facility   Increase activity slowly   Complete by: As directed      Signed: Earlene Plater, MD Internal Medicine, PGY1 Pager: [phone]  01/05/2020,1:07 PM

## 2020-01-05 NOTE — Plan of Care (Signed)

## 2020-01-05 NOTE — TOC Transition Note (Signed)
Transition of Care [facility]) - CM/SW Discharge Note   Patient Details  Name: Alisha [name] MRN: [id number] Date of Birth: 11/23/1950  Transition of Care [facility]) CM/SW Contact:  Alberteen Sam, LCSW Phone Number: 01/05/2020, 1:02 PM   Clinical Narrative:     Patient will DC to: [facility] Anticipated DC date: 01/05/20 Family notified: [facility] [id number]  Per MD patient ready for DC to [place]. RN, patient, patient's family, and facility notified of DC. Discharge Summary sent to facility. RN given number for report [phone] Room 116  . DC packet on chart. Ambulance transport requested for patient.  CSW signing off.  [place], [place]   Final next level of care: Skilled Nursing Facility Barriers to Discharge: No Barriers Identified   Patient Goals and CMS Choice Patient states their goals for this hospitalization and ongoing recovery are:: to get stronger CMS Medicare.gov Compare Post Acute Care list provided to:: Patient Represenative (must comment)(daughter Valtracy) Choice offered to / list presented to : Adult Children  Discharge Placement PASRR number recieved: 01/01/20            Patient chooses bed at: ([facility]) Patient to be transferred to facility by: [place] Name of family member notified: Valtracy Patient and family notified of of transfer: 01/05/20  Discharge Plan and Services In-house Referral: Clinical Social Work Discharge Planning Services: CM Consult                                 Social Determinants of Health ([place]) Interventions     Readmission Risk Interventions Readmission Risk Prevention Plan 12/08/2019  Transportation Screening Complete  PCP or Specialist Appt within 5-7 Days Not Complete  Not Complete comments plan for SNF  Home Care Screening Complete  Medication Review (RN CM) Referral to Pharmacy  Some recent data might be hidden

## 2020-01-05 NOTE — Progress Notes (Signed)
Called report to [place], Therapist, sports at [employer]. Patient to be picked up at 3pm. IV removed.

## 2020-01-05 NOTE — TOC Progression Note (Signed)
Transition of Care [facility]) - Progression Note    Patient Details  Name: Alisha [name] MRN: [id number] Date of Birth: [birth year]  Transition of Care [facility]) CM/SW [place], [place] Phone Number: 01/05/2020, 12:56 PM  Clinical Narrative:     CSW spoke with patient's daughter Alisha [name] who reports preference for [facility], CSW confirmed with admissions [name] that they have bed today (Room 116). [name] to call [facility]tr[facility] to answer any questions/concerns. CSW paged MD for dc summary.   Expected Discharge Plan: Skilled Nursing Facility Barriers to Discharge: Continued Medical Work up, Ship broker  Expected Discharge Plan and Services Expected Discharge Plan: [place] In-house Referral: Clinical Social Work Discharge Planning Services: CM Consult                                           Social Determinants of Health (SDOH) Interventions    Readmission Risk Interventions Readmission Risk Prevention Plan 12/08/2019  Transportation Screening Complete  PCP or Specialist Appt within 5-7 Days Not Complete  Not Complete comments plan for SNF  Home Care Screening Complete  Medication Review (RN CM) Referral to Pharmacy  Some recent data might be hidden

## 2020-01-05 NOTE — Progress Notes (Signed)
Physical Therapy Treatment Patient Details Name: Alisha [name] MRN: [id number] DOB: 12-14-1950 Today's Date: 01/05/2020    History of Present Illness 69 y.o female with fibromyalgia, bipolar, PAD, CAD, chronic venous insufficiency, and HTN who presented to the ED via EMS with left foot pain and swelling. Pt with recent SNF stay and d/c home (d/c on 5/4 and admitted to hospital 5/5). Per chart pt with hx of falls at [facility]. Imaging of L foot without acute changes, MRI revealed subcutaneous edema and Moderate tibiotalar osteoarthritis    PT Comments    Patient is making progress toward PT goals and tolerated mobility well with no c/o L foot/ankle pain. Continue to progress as tolerated with anticipated d/c to SNF for further skilled PT services.     Follow Up Recommendations  SNF;Supervision/Assistance - 24 hour     Equipment Recommendations  Other (comment)(defer to post acute rehab)    Recommendations for Other Services       Precautions / Restrictions Precautions Precautions: Fall Precaution Comments: hx falls Restrictions Weight Bearing Restrictions: No    Mobility  Bed Mobility Overal bed mobility: Needs Assistance Bed Mobility: Rolling;Sidelying to Sit Rolling: Min guard Sidelying to sit: Min guard       General bed mobility comments: cues for sequencing and use of rail; increased time and effort   Transfers Overall transfer level: Needs assistance Equipment used: Rolling walker (2 wheeled) Transfers: Sit to/from Stand Sit to Stand: Mod assist;+2 safety/equipment;From elevated surface Stand pivot transfers: +2 safety/equipment;Min assist;+2 physical assistance       General transfer comment: cues for hand placement; assist to power up into standing and to steady and guide RW when stepping to recliner  Ambulation/Gait Ambulation/Gait assistance: Min assist;+2 physical assistance Gait Distance (Feet): 3 Feet Assistive device: Rolling walker (2  wheeled) Gait Pattern/deviations: Step-to pattern;Decreased step length - right;Decreased step length - left;Wide base of support;Trunk flexed     General Gait Details: pt leaning on forearms; cues required for safety and use of AD; assist to steady and manage RW    Stairs             Wheelchair Mobility    Modified Rankin (Stroke Patients Only) Modified Rankin (Stroke Patients Only) Pre-Morbid Rankin Score: No symptoms Modified Rankin: Moderately severe disability     Balance Overall balance assessment: Needs assistance Sitting-balance support: Feet supported Sitting balance-Leahy Scale: Fair     Standing balance support: Bilateral upper extremity supported Standing balance-Leahy Scale: Poor                              Cognition Arousal/Alertness: Awake/alert Behavior During Therapy: WFL for tasks assessed/performed Overall Cognitive Status: Within Functional Limits for tasks assessed                                        Exercises      General Comments General comments (skin integrity, edema, etc.): pt incontinent of urine and bowel in bed; total A required for hygiene in standing       Pertinent Vitals/Pain Pain Assessment: No/denies pain    Home Living                      Prior Function            PT Goals (current goals can now be  found in the care plan section) Progress towards PT goals: Progressing toward goals    Frequency    Min 3X/week      PT Plan Current plan remains appropriate    Co-evaluation              AM-PAC PT "6 Clicks" Mobility   Outcome Measure  Help needed turning from your back to your side while in a flat bed without using bedrails?: A Little Help needed moving from lying on your back to sitting on the side of a flat bed without using bedrails?: A Lot Help needed moving to and from a bed to a chair (including a wheelchair)?: A Lot Help needed standing up from a chair  using your arms (e.g., wheelchair or bedside chair)?: A Lot Help needed to walk in hospital room?: A Lot Help needed climbing 3-5 steps with a railing? : Total 6 Click Score: 12    End of Session Equipment Utilized During Treatment: Gait belt Activity Tolerance: Patient tolerated treatment well Patient left: with call bell/phone within reach;in chair;with chair alarm set;with nursing/sitter in room Nurse Communication: Mobility status PT Visit Diagnosis: Other abnormalities of gait and mobility (R26.89);Muscle weakness (generalized) (M62.81);Pain Pain - Right/Left: Left Pain - part of body: Ankle and joints of foot     Time: 1300-1326 PT Time Calculation (min) (ACUTE ONLY): 26 min  Charges:  $Gait Training: 8-22 mins $Therapeutic Activity: 8-22 mins                     Earney Navy, PTA Acute Rehabilitation Services Pager: [phone] Office: [phone]     Darliss Cheney 01/05/2020, 2:57 PM

## 2020-01-05 NOTE — Progress Notes (Signed)
Subjective:   Patient evaluated at bedside this morning. She states that she is doing fine this morning. She is still having some tenderness in the R neck. She has no complaints today. She denies difficulty breathing, headaches, chest pains, and lower extremity pain. We discussed her current antibiotic course and her pending SNF placement. We discussed continuing follow up with her Case manager about placement. Patient voices understanding.   Last BM yesterday. She states that 2 days ago she was "heaving."  But feels better today.  No other complaints.  Objective:  Vital signs in last 24 hours: Vitals:   01/04/20 2020 01/04/20 2317 01/05/20 0010 01/05/20 0507  BP:  (!) 172/87 (!) 158/89 (!) 183/98  Pulse:  76 84 68  Resp:  (!) 21 18 16   Temp:  98.4 F (36.9 C) 98.2 F (36.8 C) 98.3 F (36.8 C)  TempSrc:  Oral Oral Oral  SpO2: 100% 96% 100% 96%  Weight:       Physical Exam General: Sitting up in bed, appears comfortable HEENT: NCAT, swollen right submandibular gland that is firm and tender to touch. CV: RRR, normal S1 and S2 no murmurs rubs or gallops appreciated PULM: Clear to auscultation below ABD: Soft and nontender in all quadrants  Assessment/Plan:  Active Problems:   AKI (acute kidney injury) ([place])   Urinary retention   Submandibular gland infection  In summary, Ms. Breisch is a 69 y.o female with fibromyalgia, bipolar, PAD, CAD, chronic venous insufficiency, and HTN who presented to the ED via EMS withacute onset left foot pain and AKI.  Hospital course complicated by right submandibular gland infection.   #Neck Swelling #R submandibular gland infection #Cellulitis: Stat CT neck & soft tissue w/ contrast obtained on 5/7 demonstrated enlargement of the right submandibular gland with surrounding soft tissue edema. No abscess seen.  -Appreciate ENT evaluation  -Recommended frequent heat and massage, as well as sialagogues (sour candy) to promote saliva flow.     -Recommend 10 to 14 days of antibiotics. -ID also evaluated and recommended antibiotics and steroids has been consulted, appreciate recommendations              -D/C Unasyn, start Augmentin today (day 5/14 abx)  #AKI: Resolved. Cr. Now normal after being 2.43 on admission. Etiology multifactorial. She has pre-renal etiology due to NSAID use, as well as post renal due to urinary retention. -D/c IVF -Strict I/Os -Hold anticholinergic medications -Avoid nephrotoxic medications  -Daily BMP   #HTN: Pt takes Losartan 100 mg daily at home. This was held during her hospitalization in the setting of having an AKI, which has resolved as of today. -Restart home Losartan 100 mg daily  #L foot pain: Patient was exquisitely tender to palpation of her left ankle when she first presented. X-rays demonstrated hardware from previous ankle fusion surgery but no fractures. MRI L ankle demonstrated diffuse subcutaneous edema without IV fluid collection.  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 Chronic ATFL tear. Pain has significantly improved after high dose steroids -Continue PT -Oxycodone IR 5 mg q12hrs PRN -Tylenol 650 mg q6hrs PRN  #PAD #CAD -Aspirin 81 mg daily -Plavix 75 mg daily -Atorvastatin 80 mg daily   #Chronic venous insufficiency -Continue wraps and elevation   #T2DM: Pt takes metformin at home -Hold home metformin  -SSI  #FEN/GI #Hypokalemia #Hypomagnesemia -Diet: heart healthy -Fluids: LR 100 cc/hr -Kdur 40 meq tablet x 2 -IV magnesium sulfate 2 g -Protonix 40 mg  daily -Zofran 4 mg every 6 hours as needed -MiraLAX daily   #DVT prophylaxis -Lovenox 40 mg subq injections daily  #CODE STATUS: FULL  #Dispo: Anticipated discharge in approximately today or tomorrow Prior to Admission Living Arrangement: Home Anticipated Discharge Location: SNF Barriers to Discharge: SNF placement  Earlene Plater,  MD Internal Medicine, PGY1 Pager: [phone]  01/05/2020,7:13 AM

## 2020-01-05 NOTE — TOC Progression Note (Signed)
Transition of Care [facility]) - Progression Note    Patient Details  Name: Leontine Gladen MRN: [id number] Date of Birth: 10-12-1950  [facility] CM/SW Contact  Pollie Friar, RN Phone Number: 01/05/2020, 11:46 AM  Clinical Narrative:    Left voicemail for daughter about d/c planning.   Expected Discharge Plan: Skilled Nursing Facility Barriers to Discharge: Continued Medical Work up, Ship broker  Expected Discharge Plan and Services Expected Discharge Plan: [place] In-house Referral: Clinical Social Work Discharge Planning Services: CM Consult                                           Social Determinants of Health (SDOH) Interventions    Readmission Risk Interventions Readmission Risk Prevention Plan 12/08/2019  Transportation Screening Complete  PCP or Specialist Appt within 5-7 Days Not Complete  Not Complete comments plan for SNF  Home Care Screening Complete  Medication Review (RN CM) Referral to Pharmacy  Some recent data might be hidden

## 2020-01-06 LAB — CULTURE, BLOOD (ROUTINE X 2)
Culture: NO GROWTH
Culture: NO GROWTH
Special Requests: ADEQUATE

## 2020-01-20 ENCOUNTER — Ambulatory Visit: Payer: Medicare Other | Admitting: Podiatry

## 2020-01-27 ENCOUNTER — Other Ambulatory Visit: Payer: Self-pay

## 2020-01-27 ENCOUNTER — Inpatient Hospital Stay (HOSPITAL_COMMUNITY)
Admission: EM | Admit: 2020-01-27 | Discharge: 2020-01-29 | DRG: 682 | Disposition: A | Discharge status: Skilled Nursing Facility | Payer: Medicare Other | Source: Skilled Nursing Facility | Attending: Internal Medicine | Admitting: Internal Medicine

## 2020-01-27 ENCOUNTER — Encounter (HOSPITAL_COMMUNITY): Payer: Self-pay | Admitting: Emergency Medicine

## 2020-01-27 ENCOUNTER — Emergency Department (HOSPITAL_COMMUNITY): Payer: Medicare Other

## 2020-01-27 ENCOUNTER — Observation Stay (HOSPITAL_COMMUNITY): Payer: Medicare Other

## 2020-01-27 DIAGNOSIS — N390 Urinary tract infection, site not specified: Secondary | ICD-10-CM | POA: Diagnosis present

## 2020-01-27 DIAGNOSIS — Z6841 Body Mass Index (BMI) 40.0 and over, adult: Secondary | ICD-10-CM

## 2020-01-27 DIAGNOSIS — I2510 Atherosclerotic heart disease of native coronary artery without angina pectoris: Secondary | ICD-10-CM | POA: Diagnosis present

## 2020-01-27 DIAGNOSIS — T39395A Adverse effect of other nonsteroidal anti-inflammatory drugs [NSAID], initial encounter: Secondary | ICD-10-CM | POA: Diagnosis present

## 2020-01-27 DIAGNOSIS — Z880 Allergy status to penicillin: Secondary | ICD-10-CM

## 2020-01-27 DIAGNOSIS — J9601 Acute respiratory failure with hypoxia: Secondary | ICD-10-CM | POA: Diagnosis present

## 2020-01-27 DIAGNOSIS — R339 Retention of urine, unspecified: Secondary | ICD-10-CM | POA: Diagnosis present

## 2020-01-27 DIAGNOSIS — N179 Acute kidney failure, unspecified: Secondary | ICD-10-CM | POA: Diagnosis not present

## 2020-01-27 DIAGNOSIS — Z955 Presence of coronary angioplasty implant and graft: Secondary | ICD-10-CM

## 2020-01-27 DIAGNOSIS — Z7984 Long term (current) use of oral hypoglycemic drugs: Secondary | ICD-10-CM

## 2020-01-27 DIAGNOSIS — R21 Rash and other nonspecific skin eruption: Secondary | ICD-10-CM | POA: Diagnosis not present

## 2020-01-27 DIAGNOSIS — E785 Hyperlipidemia, unspecified: Secondary | ICD-10-CM | POA: Diagnosis present

## 2020-01-27 DIAGNOSIS — Z886 Allergy status to analgesic agent status: Secondary | ICD-10-CM

## 2020-01-27 DIAGNOSIS — L432 Lichenoid drug reaction: Secondary | ICD-10-CM | POA: Diagnosis present

## 2020-01-27 DIAGNOSIS — R7989 Other specified abnormal findings of blood chemistry: Secondary | ICD-10-CM | POA: Diagnosis present

## 2020-01-27 DIAGNOSIS — F319 Bipolar disorder, unspecified: Secondary | ICD-10-CM | POA: Diagnosis present

## 2020-01-27 DIAGNOSIS — Z96653 Presence of artificial knee joint, bilateral: Secondary | ICD-10-CM | POA: Diagnosis present

## 2020-01-27 DIAGNOSIS — Z8249 Family history of ischemic heart disease and other diseases of the circulatory system: Secondary | ICD-10-CM

## 2020-01-27 DIAGNOSIS — J45909 Unspecified asthma, uncomplicated: Secondary | ICD-10-CM | POA: Diagnosis present

## 2020-01-27 DIAGNOSIS — R0902 Hypoxemia: Secondary | ICD-10-CM

## 2020-01-27 DIAGNOSIS — E11649 Type 2 diabetes mellitus with hypoglycemia without coma: Secondary | ICD-10-CM | POA: Diagnosis not present

## 2020-01-27 DIAGNOSIS — Z86718 Personal history of other venous thrombosis and embolism: Secondary | ICD-10-CM

## 2020-01-27 DIAGNOSIS — Z881 Allergy status to other antibiotic agents status: Secondary | ICD-10-CM

## 2020-01-27 DIAGNOSIS — I10 Essential (primary) hypertension: Secondary | ICD-10-CM | POA: Diagnosis present

## 2020-01-27 DIAGNOSIS — G4733 Obstructive sleep apnea (adult) (pediatric): Secondary | ICD-10-CM | POA: Diagnosis present

## 2020-01-27 DIAGNOSIS — Z79899 Other long term (current) drug therapy: Secondary | ICD-10-CM

## 2020-01-27 DIAGNOSIS — L89152 Pressure ulcer of sacral region, stage 2: Secondary | ICD-10-CM | POA: Diagnosis present

## 2020-01-27 DIAGNOSIS — K219 Gastro-esophageal reflux disease without esophagitis: Secondary | ICD-10-CM | POA: Diagnosis present

## 2020-01-27 DIAGNOSIS — Z7951 Long term (current) use of inhaled steroids: Secondary | ICD-10-CM

## 2020-01-27 DIAGNOSIS — Z7902 Long term (current) use of antithrombotics/antiplatelets: Secondary | ICD-10-CM

## 2020-01-27 DIAGNOSIS — G9349 Other encephalopathy: Secondary | ICD-10-CM | POA: Diagnosis present

## 2020-01-27 DIAGNOSIS — E1151 Type 2 diabetes mellitus with diabetic peripheral angiopathy without gangrene: Secondary | ICD-10-CM | POA: Diagnosis present

## 2020-01-27 DIAGNOSIS — Z20822 Contact with and (suspected) exposure to covid-19: Secondary | ICD-10-CM | POA: Diagnosis present

## 2020-01-27 DIAGNOSIS — Z7982 Long term (current) use of aspirin: Secondary | ICD-10-CM

## 2020-01-27 DIAGNOSIS — Z888 Allergy status to other drugs, medicaments and biological substances status: Secondary | ICD-10-CM

## 2020-01-27 DIAGNOSIS — L8990 Pressure ulcer of unspecified site, unspecified stage: Secondary | ICD-10-CM | POA: Insufficient documentation

## 2020-01-27 DIAGNOSIS — E873 Alkalosis: Secondary | ICD-10-CM | POA: Diagnosis present

## 2020-01-27 LAB — CBC
HCT: 35.4 % — ABNORMAL LOW (ref 36.0–46.0)
Hemoglobin: 11.3 g/dL — ABNORMAL LOW (ref 12.0–15.0)
MCH: 29.4 pg (ref 26.0–34.0)
MCHC: 31.9 g/dL (ref 30.0–36.0)
MCV: 91.9 fL (ref 80.0–100.0)
Platelets: 386 10*3/uL (ref 150–400)
RBC: 3.85 MIL/uL — ABNORMAL LOW (ref 3.87–5.11)
RDW: 15.9 % — ABNORMAL HIGH (ref 11.5–15.5)
WBC: 8 10*3/uL (ref 4.0–10.5)
nRBC: 0 % (ref 0.0–0.2)

## 2020-01-27 LAB — COMPREHENSIVE METABOLIC PANEL
ALT: 24 U/L (ref 0–44)
AST: 34 U/L (ref 15–41)
Albumin: 2.7 g/dL — ABNORMAL LOW (ref 3.5–5.0)
Alkaline Phosphatase: 94 U/L (ref 38–126)
Anion gap: 16 — ABNORMAL HIGH (ref 5–15)
BUN: 33 mg/dL — ABNORMAL HIGH (ref 8–23)
CO2: 16 mmol/L — ABNORMAL LOW (ref 22–32)
Calcium: 9.3 mg/dL (ref 8.9–10.3)
Chloride: 105 mmol/L (ref 98–111)
Creatinine, Ser: 2.64 mg/dL — ABNORMAL HIGH (ref 0.44–1.00)
GFR calc Af Amer: 20 mL/min — ABNORMAL LOW (ref >60)
GFR calc non Af Amer: 18 mL/min — ABNORMAL LOW (ref >60)
Glucose, Bld: 94 mg/dL (ref 70–99)
Potassium: 4.4 mmol/L (ref 3.5–5.1)
Sodium: 137 mmol/L (ref 135–145)
Total Bilirubin: 1.1 mg/dL (ref 0.3–1.2)
Total Protein: 7.7 g/dL (ref 6.5–8.1)

## 2020-01-27 LAB — POCT I-STAT 7, (LYTES, BLD GAS, ICA,H+H)
Acid-base deficit: 5 mmol/L — ABNORMAL HIGH (ref 0.0–2.0)
Bicarbonate: 16.9 mmol/L — ABNORMAL LOW (ref 20.0–28.0)
Calcium, Ion: 1.22 mmol/L (ref 1.15–1.40)
HCT: 30 % — ABNORMAL LOW (ref 36.0–46.0)
Hemoglobin: 10.2 g/dL — ABNORMAL LOW (ref 12.0–15.0)
O2 Saturation: 100 %
Potassium: 4.2 mmol/L (ref 3.5–5.1)
Sodium: 138 mmol/L (ref 135–145)
TCO2: 18 mmol/L — ABNORMAL LOW (ref 22–32)
pCO2 arterial: 22.1 mmHg — ABNORMAL LOW (ref 32.0–48.0)
pH, Arterial: 7.492 — ABNORMAL HIGH (ref 7.350–7.450)
pO2, Arterial: 175 mmHg — ABNORMAL HIGH (ref 83.0–108.0)

## 2020-01-27 LAB — HEPARIN LEVEL (UNFRACTIONATED): Heparin Unfractionated: 1.06 IU/mL — ABNORMAL HIGH (ref 0.30–0.70)

## 2020-01-27 LAB — SARS CORONAVIRUS 2 BY RT PCR (HOSPITAL ORDER, PERFORMED IN ~~LOC~~ HOSPITAL LAB): SARS Coronavirus 2: NEGATIVE

## 2020-01-27 LAB — D-DIMER, QUANTITATIVE: D-Dimer, Quant: 12.46 ug/mL-FEU — ABNORMAL HIGH (ref 0.00–0.50)

## 2020-01-27 MED ORDER — ACETAMINOPHEN 325 MG PO TABS
650.0000 mg | ORAL_TABLET | Freq: Four times a day (QID) | ORAL | Status: DC | PRN
  Administered 2020-01-27 – 2020-01-29 (×3): 650 mg via ORAL
  Filled 2020-01-27 (×3): qty 2

## 2020-01-27 MED ORDER — INSULIN ASPART 100 UNIT/ML ~~LOC~~ SOLN: 0.0000 [IU] | Freq: Three times a day (TID) | SUBCUTANEOUS | Status: DC

## 2020-01-27 MED ORDER — ACETAMINOPHEN 325 MG PO TABS
650.0000 mg | ORAL_TABLET | Freq: Once | ORAL | Status: AC
  Administered 2020-01-27: 650 mg via ORAL
  Filled 2020-01-27: qty 2

## 2020-01-27 MED ORDER — GABAPENTIN 100 MG PO CAPS
200.0000 mg | ORAL_CAPSULE | Freq: Three times a day (TID) | ORAL | Status: DC
  Administered 2020-01-27 – 2020-01-29 (×6): 200 mg via ORAL
  Filled 2020-01-27 (×6): qty 2

## 2020-01-27 MED ORDER — ALBUTEROL SULFATE (2.5 MG/3ML) 0.083% IN NEBU: 2.5000 mg | INHALATION_SOLUTION | Freq: Four times a day (QID) | RESPIRATORY_TRACT | Status: DC | PRN

## 2020-01-27 MED ORDER — HEPARIN BOLUS VIA INFUSION
5300.0000 [IU] | Freq: Once | INTRAVENOUS | Status: AC
  Administered 2020-01-27: 5300 [IU] via INTRAVENOUS
  Filled 2020-01-27: qty 5300

## 2020-01-27 MED ORDER — HEPARIN BOLUS VIA INFUSION: 4000.0000 [IU] | Freq: Once | INTRAVENOUS | Status: DC

## 2020-01-27 MED ORDER — HEPARIN SODIUM (PORCINE) 5000 UNIT/ML IJ SOLN
5000.0000 [IU] | Freq: Three times a day (TID) | INTRAMUSCULAR | Status: DC
  Administered 2020-01-28 – 2020-01-29 (×4): 5000 [IU] via SUBCUTANEOUS
  Filled 2020-01-27 (×4): qty 1

## 2020-01-27 MED ORDER — PANTOPRAZOLE SODIUM 40 MG PO TBEC
40.0000 mg | DELAYED_RELEASE_TABLET | Freq: Every day | ORAL | Status: DC
  Administered 2020-01-28 – 2020-01-29 (×2): 40 mg via ORAL
  Filled 2020-01-27 (×2): qty 1

## 2020-01-27 MED ORDER — PREDNISONE 20 MG PO TABS
60.0000 mg | ORAL_TABLET | Freq: Once | ORAL | Status: AC
  Administered 2020-01-27: 60 mg via ORAL
  Filled 2020-01-27: qty 3

## 2020-01-27 MED ORDER — LACTATED RINGERS IV BOLUS
500.0000 mL | Freq: Once | INTRAVENOUS | Status: AC
  Administered 2020-01-28: 500 mL via INTRAVENOUS

## 2020-01-27 MED ORDER — MOMETASONE FURO-FORMOTEROL FUM 100-5 MCG/ACT IN AERO
2.0000 | INHALATION_SPRAY | Freq: Two times a day (BID) | RESPIRATORY_TRACT | Status: DC
  Administered 2020-01-27 – 2020-01-29 (×4): 2 via RESPIRATORY_TRACT
  Filled 2020-01-27: qty 8.8

## 2020-01-27 MED ORDER — TOPIRAMATE 25 MG PO TABS
50.0000 mg | ORAL_TABLET | Freq: Every day | ORAL | Status: DC
  Administered 2020-01-27 – 2020-01-28 (×2): 50 mg via ORAL
  Filled 2020-01-27 (×2): qty 2

## 2020-01-27 MED ORDER — ONDANSETRON HCL 4 MG/2ML IJ SOLN: 4.0000 mg | Freq: Four times a day (QID) | INTRAMUSCULAR | Status: DC | PRN

## 2020-01-27 MED ORDER — SENNOSIDES-DOCUSATE SODIUM 8.6-50 MG PO TABS: 1.0000 | ORAL_TABLET | Freq: Every evening | ORAL | Status: DC | PRN

## 2020-01-27 MED ORDER — TECHNETIUM TO 99M ALBUMIN AGGREGATED
1.7000 | Freq: Once | INTRAVENOUS | Status: AC | PRN
  Administered 2020-01-27: 1.7 via INTRAVENOUS

## 2020-01-27 MED ORDER — DIPHENHYDRAMINE HCL 25 MG PO CAPS
25.0000 mg | ORAL_CAPSULE | ORAL | Status: DC | PRN
  Administered 2020-01-27: 25 mg via ORAL
  Filled 2020-01-27: qty 1

## 2020-01-27 MED ORDER — UMECLIDINIUM BROMIDE 62.5 MCG/INH IN AEPB
1.0000 | INHALATION_SPRAY | Freq: Every day | RESPIRATORY_TRACT | Status: DC
  Filled 2020-01-27: qty 7

## 2020-01-27 MED ORDER — ONDANSETRON HCL 4 MG PO TABS: 4.0000 mg | ORAL_TABLET | Freq: Four times a day (QID) | ORAL | Status: DC | PRN

## 2020-01-27 MED ORDER — ACETAMINOPHEN 650 MG RE SUPP: 650.0000 mg | Freq: Four times a day (QID) | RECTAL | Status: DC | PRN

## 2020-01-27 MED ORDER — HEPARIN (PORCINE) 25000 UT/250ML-% IV SOLN
1300.0000 [IU]/h | INTRAVENOUS | Status: DC
  Administered 2020-01-27: 1300 [IU]/h via INTRAVENOUS
  Filled 2020-01-27: qty 250

## 2020-01-27 NOTE — ED Notes (Signed)
PT linens changed.

## 2020-01-27 NOTE — Progress Notes (Signed)
Patient admitted to 35w23. Patient alert  x oriented to self and place. MRSA swab completed. Bed alarm placed. Skin assessed.

## 2020-01-27 NOTE — Progress Notes (Signed)
Patient stated that she does have and CPAP and BiPAP at home but does not wear them at the facility.  Patient stated she will not need the CPAP tonight and will wear the [place].  Patient vital signs are WNL and patient isn't in any distress at this time. RT will continue to monitor.

## 2020-01-27 NOTE — Progress Notes (Signed)
[place] for Heparin Indication: R/O PE pening VQ scan   Allergies  Allergen Reactions  . Ibuprofen     Other reaction(s): Upset Stomach  . Ampicillin Nausea Only  . Asa [Aspirin] Diarrhea    Other reaction(s): Upset Stomach  . Darvon [Propoxyphene] Nausea And Vomiting  . Demeclocycline Other (See Comments)    Nerves feeling  . Eggs Or Egg-Derived Products Diarrhea  . Lactalbumin Diarrhea  . Lisinopril Cough  . Milk-Related Compounds Diarrhea  . Salicylates Other (See Comments)    Unknown Unknown   . Tetracycline Hcl Hives and Nausea Only  . Tetracyclines & Related Other (See Comments)    Nerves feeling     Patient Measurements: Height: 5\' 6"  (167.6 cm) Weight: 118.8 kg (262 lb) IBW/kg (Calculated) : 59.3 Heparin Dosing Weight: 87.5 kg  Vital Signs: BP: 101/59 (06/01 1203) Pulse Rate: 82 (06/01 1145)  Labs: Recent Labs    01/27/20 1109 01/27/20 1125  HGB 10.2* 11.3*  HCT 30.0* 35.4*  PLT  --  386  CREATININE  --  2.64*    Estimated Creatinine Clearance: 26 mL/min (A) (by C-G formula based on SCr of 2.64 mg/dL (H)).   Medical History: Past Medical History:  Diagnosis Date  . AKI (acute kidney injury) ([place])    12/31/2019  . Arthritis   . Asthma   . Bipolar disorder ([place])   . CAD (coronary artery disease) 07/07/2016   a. s/p stenting x 2 in [place] to the LAD, RCA is chronically occluded, LVEDP was 34  . Cancer ([facility])    cervical  . Depression   . Fibromyalgia   . GERD (gastroesophageal reflux disease)   . History of benign pituitary tumor   . History of kidney stones    1972  . Hyperlipidemia   . Hypertension   . Migraine   . Morbid obesity ([place])   . Peripheral vascular disease ([place])   . Pneumonia   . Seizures ([place])    during surgery for pituitary tumor  . Sleep apnea    with cpap  . Vertigo     Medications:  Scheduled:  . heparin  5,300 Units Intravenous Once  . predniSONE  60 mg Oral Once     Assessment: Patient is a34 yof that presented to the ED with SOB and low Os saturations. The patient had a significantly elevated D-dimer and there is concern for a PE. Pharmacy has been asked to dose heparin pending VQ scan to confirm.   Goal of Therapy:  Heparin level 0.3-0.7 units/ml Monitor platelets by anticoagulation protocol: Yes   Plan:  - Heparin bolus 5300 units IV x 1 dose - Heparin drip @ 1300 units/hr - Heparin level in ~ 8 hours  - Monitor patient for s/s of bleeding and CBC while on heparin   Duanne Limerick PharmD. BCPS  01/27/2020,1:18 PM

## 2020-01-27 NOTE — ED Notes (Signed)
PT transported to nuc med on 4L O2

## 2020-01-27 NOTE — ED Provider Notes (Signed)
[facility] EMERGENCY DEPARTMENT Provider Note   CSN: [id number] Arrival date & time: 01/27/20  1022     History No chief complaint on file.   Alisha [name] is a 69 y.o. female.  HPI     69 year old female history of bipolar disorder, CKD, depression, presents today from skilled nursing facility reports that she had low oxygen saturations.  EMS reports that they were told her saturations were 70%.  They report titrating her oxygen but being unable`to obtain oxygen sats.    Past Medical History:  Diagnosis Date  . AKI (acute kidney injury) ([place])    12/31/2019  . Arthritis   . Asthma   . Bipolar disorder ([place])   . CAD (coronary artery disease) 07/07/2016   a. s/p stenting x 2 in [place] to the LAD, RCA is chronically occluded, LVEDP was 34  . Cancer (HCC)    cervical  . Depression   . Fibromyalgia   . GERD (gastroesophageal reflux disease)   . History of benign pituitary tumor   . History of kidney stones    1972  . Hyperlipidemia   . Hypertension   . Migraine   . Morbid obesity ([place])   . Peripheral vascular disease ([place])   . Pneumonia   . Seizures ([place])    during surgery for pituitary tumor  . Sleep apnea    with cpap  . Vertigo     Patient Active Problem List   Diagnosis Date Noted  . Submandibular gland infection   . Urinary retention   . Cerebrovascular accident (CVA) ([place])   . Altered mental status   . Fall   . AKI (acute kidney injury) ([place]) 11/03/2019  . Recurrent falls 09/22/2018  . Chronic use of opiate drug for therapeutic purpose 04/11/2018  . Bradycardia 04/11/2018  . Chronic fatigue 04/11/2018  . Prediabetes 12/17/2017  . Mild cognitive impairment 12/13/2017  . Cough variant asthma vs UACS 10/26/2017  . Upper airway cough syndrome 10/25/2017  . Medial epicondylitis, right 09/16/2017  . Right shoulder pain 09/07/2017  . Lateral epicondylitis of right elbow 09/07/2017  . Vitamin D deficiency 07/13/2017  .  History of foot fracture 07/12/2017  . History of colon polyps 07/12/2017  . Cataract of both eyes 07/11/2017  . Hypertensive retinopathy of both eyes 07/11/2017  . Fibromyalgia 05/17/2017  . Tenosynovitis, wrist 05/14/2017  . Chronic cough 05/02/2017  . Foot pain, left 05/02/2017  . Dysphagia 03/23/2017  . Health care maintenance 03/23/2017  . Coronary artery disease involving native coronary artery of native heart without angina pectoris 01/11/2017  . Hyperlipidemia 01/11/2017  . Polypharmacy 11/25/2016  . Venous stasis of both lower extremities 10/04/2016  . Poor dentition 10/04/2016  . OSA (obstructive sleep apnea) 10/04/2016  . History of nephrolithiasis 10/04/2016  . Hx of pilonidal cyst 10/04/2016  . Bipolar depression ([place]) 10/04/2016  . Personal history of DVT (deep vein thrombosis) 10/04/2016  . Morbid obesity due to excess calories ([place]) 09/25/2016  . History of pituitary adenoma s/p resection 09/25/2016  . Essential hypertension 09/25/2016  . Psoriatic arthritis ([place]) 09/25/2016  . Vision impairment 09/25/2016  . Chronic seasonal allergic rhinitis due to pollen 09/25/2016  . Moderate persistent asthma without complication [id number]  . Gastroesophageal reflux disease without esophagitis 09/25/2016  . Urge incontinence 09/25/2016  . Chronic pain syndrome 09/25/2016  . Candidal intertrigo 05/10/2010    Past Surgical History:  Procedure Laterality Date  . ANKLE FUSION Bilateral   . carpel tunnel release  Bilateral 2017  . CORONARY ANGIOPLASTY    . ESOPHAGOGASTRODUODENOSCOPY (EGD) WITH PROPOFOL N/A 06/14/2017   Procedure: ESOPHAGOGASTRODUODENOSCOPY (EGD) WITH PROPOFOL;  Surgeon: Wilford Corner, MD;  Location: [facility];  Service: Endoscopy;  Laterality: N/A;  . EXCISION / CURETTAGE BONE CYST PHALANGES OF FOOT  2014   Removal of foot cyst   . PARTIAL HYSTERECTOMY  unknown   Patient still has ovaries  . pituitary tumor removal  1999   removed as much as they  could, on optic nerve  . TONSILLECTOMY    . TOTAL KNEE ARTHROPLASTY     TKR X 3  2 on the left and 1 on the right     OB History   No obstetric history on file.     Family History  Problem Relation Age of Onset  . Heart disease Mother 59       CABG, pacemaker, valve  . Cancer Father   . Heart disease Sister        Fluid around the heart  . Breast cancer Neg Hx     Social History   Tobacco Use  . Smoking status: Never Smoker  . Smokeless tobacco: Never Used  Substance Use Topics  . Alcohol use: Yes    Comment: Socially. [name].  . Drug use: No    Home Medications Prior to Admission medications   Medication Sig Start Date End Date Taking? Authorizing Provider  albuterol (PROVENTIL) (2.5 MG/3ML) 0.083% nebulizer solution Take 3 mLs (2.5 mg total) by nebulization every 6 (six) hours as needed for wheezing or shortness of breath. 11/10/16   Briscoe Deutscher, DO  albuterol (VENTOLIN HFA) 108 (90 Base) MCG/ACT inhaler Inhale 1 puff into the lungs every 6 (six) hours as needed for wheezing or shortness of breath.    [provider]  aspirin EC 81 MG tablet Take 81 mg by mouth daily.    [provider]  atorvastatin (LIPITOR) 80 MG tablet Take 80 mg by mouth at bedtime.     [provider]  budesonide-formoterol (SYMBICORT) 80-4.5 MCG/ACT inhaler Inhale 2 puffs into the lungs 2 (two) times daily. 01/28/18   Lucious Groves, DO  clopidogrel (PLAVIX) 75 MG tablet Take 1 tablet (75 mg total) by mouth daily. 10/18/18   Pieter Partridge, DO  DULoxetine (CYMBALTA) 60 MG capsule Take 1 capsule (60 mg total) by mouth daily. 05/24/17 10/21/28  Lucious Groves, DO  gabapentin (NEURONTIN) 300 MG capsule Take 300 mg by mouth 3 (three) times daily. 10/07/19   [provider]  loratadine (CLARITIN) 10 MG tablet Take 10 mg by mouth daily.    [provider]  losartan (COZAAR) 100 MG tablet Take 1 tablet (100 mg total) daily by mouth. 07/06/17   Lucious Groves, DO  metFORMIN (GLUCOPHAGE) 500 MG tablet Take 500 mg by mouth 2 (two) times daily with a meal.    [provider]  mirabegron ER (MYRBETRIQ) 50 MG TB24 tablet Take 50 mg by mouth daily.    [provider]  ondansetron (ZOFRAN) 4 MG tablet Take 4 mg by mouth every 6 (six) hours as needed for nausea or vomiting.    [provider]  pantoprazole (PROTONIX) 40 MG tablet Take 40 mg by mouth daily.  10/07/19   [provider]  SPIRIVA RESPIMAT 1.25 MCG/ACT AERS Inhale 2 puffs into the lungs daily.  10/07/19   [provider]  topiramate (TOPAMAX) 50 MG tablet Take 50 mg by mouth  at bedtime.    [provider]    Allergies    Ibuprofen, Ampicillin, Asa [aspirin], Darvon [propoxyphene], Demeclocycline, Eggs or egg-derived products, Lactalbumin, Lisinopril, Milk-related compounds, Salicylates, Tetracycline hcl, and Tetracyclines & related  Review of Systems   Review of Systems  Physical Exam Updated Vital Signs BP (!) 121/97 (BP Location: Right Arm)   Pulse 60   Resp 20   SpO2 95%   Physical Exam Vitals reviewed.  Constitutional:      General: She is not in acute distress.    Appearance: She is obese.  HENT:     Head: Normocephalic.     Right Ear: External ear normal.     Left Ear: External ear normal.     Nose: Nose normal.     Mouth/Throat:     Mouth: Mucous membranes are moist.  Eyes:     Pupils: Pupils are equal, round, and reactive to light.  Cardiovascular:     Rate and Rhythm: Normal rate and regular rhythm.     Pulses: Normal pulses.     Heart sounds: Normal heart sounds.  Pulmonary:     Effort: Pulmonary effort is normal.     Breath sounds: Normal breath sounds.  Abdominal:     General: Bowel sounds are normal. There is no distension.     Palpations: Abdomen is soft. There is no mass.     Tenderness: There is no abdominal tenderness.  Musculoskeletal:        General: Normal range of motion.     Cervical back: Normal  range of motion.  Skin:    General: Skin is warm.     Capillary Refill: Capillary refill takes less than 2 seconds.     Findings: Rash present.     Comments: Diffuse maculopapular rash arms and legs  Neurological:     General: No focal deficit present.     Mental Status: She is alert.  Psychiatric:        Mood and Affect: Mood normal.     ED Results / Procedures / Treatments   Labs (all labs ordered are listed, but only abnormal results are displayed) Labs Reviewed  POCT I-STAT 7, (LYTES, BLD GAS, ICA,H+H) - Abnormal; Notable for the following components:      Result Value   pH, Arterial 7.492 (*)    pCO2 arterial 22.1 (*)    pO2, Arterial 175 (*)    Bicarbonate 16.9 (*)    TCO2 18 (*)    Acid-base deficit 5.0 (*)    HCT 30.0 (*)    Hemoglobin 10.2 (*)    All other components within normal limits  CBC  COMPREHENSIVE METABOLIC PANEL  D-DIMER, QUANTITATIVE (NOT AT [facility])  I-STAT ARTERIAL BLOOD GAS, ED    EKG EKG Interpretation  Date/Time:  Tuesday January 27 2020 10:32:47 EDT Ventricular Rate:  99 PR Interval:    QRS Duration: 92 QT Interval:  379 QTC Calculation: 487 R Axis:   -8 Text Interpretation: Sinus rhythm Low voltage, precordial leads Left ventricular hypertrophy Borderline prolonged QT interval Confirmed by Pattricia Boss [phone]) on 01/27/2020 11:20:06 AM   Radiology DG Chest Port 1 View  Result Date: 01/27/2020 CLINICAL DATA:  Dyspnea. EXAM: PORTABLE CHEST 1 VIEW COMPARISON:  12/30/2019 FINDINGS: The heart size and mediastinal contours are within normal limits. Both lungs are clear. The visualized skeletal structures are unremarkable. IMPRESSION: Negative one-view chest x-Kolter Reaver Electronically Signed   By: San Morelle M.D.   On: 01/27/2020 10:57  Procedures .Critical Care Performed by: Pattricia Boss, MD Authorized by: Pattricia Boss, MD   Critical care provider statement:    Critical care time (minutes):  45   Critical care end time:  01/27/2020 1:38  PM   Critical care was necessary to treat or prevent imminent or life-threatening deterioration of the following conditions:  Respiratory failure and dehydration   Critical care was time spent personally by me on the following activities:  Discussions with consultants, evaluation of patient's response to treatment, examination of patient, ordering and performing treatments and interventions, ordering and review of laboratory studies, ordering and review of radiographic studies, pulse oximetry, re-evaluation of patient's condition, obtaining history from patient or surrogate and review of old charts   (including critical care time)  Medications Ordered in ED Medications - No data to display  ED Course  I have reviewed the triage vital signs and the nursing notes.  Pertinent labs & imaging results that were available during my care of the patient were reviewed by me and considered in my medical decision making (see chart for details).  Clinical Course as of Jan 26 1300  Tue Jan 27, 2020  1254 Labs, chest x-Jerrad Mendibles, and EKG personally reviewed by me and compared to old   [DR]    Clinical Course User Index [DR] Pattricia Boss, MD   MDM Rules/Calculators/A&P                      1-dyspnea- unclear prehospital measurements.  Here patient on oxygen, lungs clear, po2 at 175 CXR clear, but d-dimer elevated.  Creatinine elevated and unable to obtain cta Plan heparin and will discuss with admitting team- dopplers and/or vq scan? 2- aki-patient on losartan.  No reports of decreased p.o. intake.  Will gently hydrate. 1:02 PM Patient's blood pressure 79/60.  IV fluid bolus 500 cc ordered 3-rash- consider steroids Discussed with Dr. Sharon Seller who will see for admission Final Clinical Impression(s) / ED Diagnoses Final diagnoses:  AKI (acute kidney injury) ([place])  Hypoxia  Rash    Rx / DC Orders ED Discharge Orders    None       Pattricia Boss, MD 01/27/20 1338

## 2020-01-27 NOTE — H&P (Signed)
Date: 01/27/2020               Patient Name:  Alisha [name] MRN: [id number]  DOB: [birth year] Age / Sex: 69 y.o., female   PCP: Sonia Side., FNP         Medical Service: Internal Medicine Teaching Service         Attending Physicin: Dr. Aldine Contes, MD    First Contact: Marianna Payment, DO, [facility] Pager: [facility] [phone])  Second Contact: Sharon Seller, DO, Jaimie PagerJari Pigg [phone])       After Hours (After 5p/  First Contact Pager: [phone]  weekends /holiday): Second Contact Pager: [phone]   Chief Complaint: Acute hypoxic respiratory failure  History of Present Illness: Alisha [name] is a 69 y.o. female with pertinent PMH of Asthma, CAD s/p stent), HTN, OSA, CVA, subjective history of DVT who presents from SNF with hypoxemia with SpO2 in the 70%.   From a skilled nursing facility.  On evaluation she is confused and has a difficult time providing the events leading up to her hospitalization.  She admits to having progressive shortness of breath, but otherwise is unable to provide any other pertinent symptoms.  I called the patient's daughter to give more information.  She states that the patient was recently discharged from [facility] roughly a month ago for similar reasons.  She states that the patient gets confused periodically and will have to come into the hospital for worsening kidney problems.  She states that she has had decreased appetite and periodically has problems with stress incontinence. Otherwise she is unsure of any other events leading up to this hospitalization.   ED Course:  The ED ordered a D-dimer due to concerns over history of blood clots with abrupt shortness of breath.  The D-dimer is elevated and subsequent VQ scan was unremarkable.   Lab Orders     SARS Coronavirus 2 by RT PCR (hospital order, performed in [facility] hospital lab) Nasopharyngeal Nasopharyngeal Swab     CBC     Comprehensive metabolic panel     D-dimer, quantitative  (not at [facility])     Heparin level (unfractionated)     Heparin level (unfractionated)     CBC     CBC WITH DIFFERENTIAL     Comprehensive metabolic panel     Urinalysis, Complete w Microscopic     Sodium, urine, random     Creatinine, urine, random     I-Stat arterial blood gas, ED     I-STAT 7, (LYTES, BLD GAS, ICA, H+H)   Meds:  No outpatient medications have been marked as taking for the 01/27/20 encounter [facility] Encounter).    Social:  Social History   Socioeconomic History  . Marital status: Divorced    Spouse name: Not on file  . Number of children: Not on file  . Years of education: Not on file  . Highest education level: Not on file  Occupational History  . Not on file  Tobacco Use  . Smoking status: Never Smoker  . Smokeless tobacco: Never Used  Substance and Sexual Activity  . Alcohol use: Yes    Comment: Socially. [name].  . Drug use: No  . Sexual activity: Not Currently  Other Topics Concern  . Not on file  Social History Narrative   Current Social History        Who lives at home: Lives with youngest daughter, "Olivia Mackie" 09/25/2016    Transportation: Public [id number]   Important  Relationships & Pets: 2 daughters (52, 14), 1 son (56), 10 grandchildren, 4 great grandchildren 09/25/2016    Current Stressors: Recently moved from [place], [place] due to threat of NH placement 09/25/2016   Religious / Personal Beliefs: Christian 09/25/2016   Interests / Fun: Dancing 09/25/2016   Other: Participates in [employer] 09/25/2016   Social Determinants of Health   Financial Resource Strain:   . Difficulty of Paying Living Expenses:   Food Insecurity:   . Worried About Charity fundraiser in the Last Year:   . Arboriculturist in the Last Year:   Transportation Needs:   . Film/video editor (Medical):   Marland Kitchen Lack of Transportation (Non-Medical):   Physical Activity:   . Days of Exercise per Week:   . Minutes of Exercise per Session:   Stress:   . Feeling of  Stress :   Social Connections:   . Frequency of Communication with Friends and Family:   . Frequency of Social Gatherings with Friends and Family:   . Attends Religious Services:   . Active Member of Clubs or Organizations:   . Attends Archivist Meetings:   Marland Kitchen Marital Status:   Intimate Partner Violence:   . Fear of Current or Ex-Partner:   . Emotionally Abused:   Marland Kitchen Physically Abused:   . Sexually Abused:      Family History:  Family History  Problem Relation Age of Onset  . Heart disease Mother 59       CABG, pacemaker, valve  . Cancer Father   . Heart disease Sister        Fluid around the heart  . Breast cancer Neg Hx      Allergies: Allergies as of 01/27/2020 - Review Complete 01/27/2020  Allergen Reaction Noted  . Ibuprofen  08/15/2012  . Ampicillin Nausea Only 09/25/2016  . Asa [aspirin] Diarrhea 04/12/2004  . Darvon [propoxyphene] Nausea And Vomiting 04/12/2004  . Demeclocycline Other (See Comments) 09/25/2016  . Eggs or egg-derived products Diarrhea 02/19/2017  . Lactalbumin Diarrhea 02/19/2017  . Lisinopril Cough 05/04/2017  . Milk-related compounds Diarrhea 02/19/2017  . Salicylates Other (See Comments) 09/25/2016  . Tetracycline hcl Hives and Nausea Only 04/12/2004  . Tetracyclines & related Other (See Comments) 09/25/2016   Past Medical History:  Diagnosis Date  . AKI (acute kidney injury) ([place])    12/31/2019  . Arthritis   . Asthma   . Bipolar disorder ([place])   . CAD (coronary artery disease) 07/07/2016   a. s/p stenting x 2 in [place] to the LAD, RCA is chronically occluded, LVEDP was 34  . Cancer (HCC)    cervical  . Depression   . Fibromyalgia   . GERD (gastroesophageal reflux disease)   . History of benign pituitary tumor   . History of kidney stones    1972  . Hyperlipidemia   . Hypertension   . Migraine   . Morbid obesity ([place])   . Peripheral vascular disease ([place])   . Pneumonia   . Seizures ([place])    during surgery for  pituitary tumor  . Sleep apnea    with cpap  . Vertigo      Review of Systems: A complete ROS was negative except as per HPI.   Physical Exam: Blood pressure (!) 144/73, pulse 82, temperature 97.7 F (36.5 C), temperature source Oral, resp. rate 19, height 5\' 6"  (1.676 m), weight 118.8 kg, SpO2 100 %. Physical Exam  Constitutional: No distress.  HENT:  Head: Normocephalic and atraumatic.  Eyes: EOM are normal.  Cardiovascular: Normal rate and intact distal pulses.  Pulmonary/Chest: Effort normal and breath sounds normal. Tachypnea noted. No respiratory distress.  Abdominal: Soft. She exhibits no distension. There is no abdominal tenderness.  Umbilical hernia  Musculoskeletal:        General: No edema. Normal range of motion.     Cervical back: Normal range of motion.  Neurological: She is alert. She has normal sensation. She displays no tremor. No cranial nerve deficit.  Confused  Skin: Skin is warm and dry. She is not diaphoretic.    Labs: CBC    Component Value Date/Time   WBC 8.0 01/27/2020 1125   RBC 3.85 (L) 01/27/2020 1125   HGB 11.3 (L) 01/27/2020 1125   HGB 13.5 12/14/2017 0849   HCT 35.4 (L) 01/27/2020 1125   HCT 40.4 12/14/2017 0849   PLT 386 01/27/2020 1125   PLT 217 12/14/2017 0849   MCV 91.9 01/27/2020 1125   MCV 87 12/14/2017 0849   MCH 29.4 01/27/2020 1125   MCHC 31.9 01/27/2020 1125   RDW 15.9 (H) 01/27/2020 1125   RDW 14.4 12/14/2017 0849   LYMPHSABS 2.0 11/03/2019 1400   MONOABS 0.7 11/03/2019 1400   EOSABS 0.2 11/03/2019 1400   BASOSABS 0.1 11/03/2019 1400     CMP     Component Value Date/Time   NA 137 01/27/2020 1125   NA 142 09/19/2018 1454   K 4.4 01/27/2020 1125   CL 105 01/27/2020 1125   CO2 16 (L) 01/27/2020 1125   GLUCOSE 94 01/27/2020 1125   BUN 33 (H) 01/27/2020 1125   BUN 17 09/19/2018 1454   CREATININE 2.64 (H) 01/27/2020 1125   CREATININE 0.95 11/15/2016 1013   CALCIUM 9.3 01/27/2020 1125   PROT 7.7 01/27/2020 1125    PROT 6.8 12/13/2017 1142   ALBUMIN 2.7 (L) 01/27/2020 1125   ALBUMIN 4.0 12/13/2017 1142   AST 34 01/27/2020 1125   ALT 24 01/27/2020 1125   ALKPHOS 94 01/27/2020 1125   BILITOT 1.1 01/27/2020 1125   BILITOT 0.5 12/13/2017 1142   GFRNONAA 18 (L) 01/27/2020 1125   GFRAA 20 (L) 01/27/2020 1125    Imaging: NM Pulmonary Perfusion  Result Date: 01/27/2020 CLINICAL DATA:  Shortness of breath with elevated D-dimer EXAM: NUCLEAR MEDICINE PERFUSION LUNG SCAN TECHNIQUE: Perfusion images were obtained in multiple projections after intravenous injection of radiopharmaceutical. Ventilation scans intentionally deferred if perfusion scan and chest x-ray adequate for interpretation during COVID 19 epidemic. RADIOPHARMACEUTICALS:  1.7 mCi Tc-57m MAA IV COMPARISON:  Chest x-ray 01/27/2020 FINDINGS: Perfusion only imaging of the lungs demonstrates homogeneous uptake without segmental defect. IMPRESSION: Low probability for pulmonary embolism. Electronically Signed   By: Davina Poke D.O.   On: 01/27/2020 16:22   DG Chest Port 1 View  Result Date: 01/27/2020 CLINICAL DATA:  Dyspnea. EXAM: PORTABLE CHEST 1 VIEW COMPARISON:  12/30/2019 FINDINGS: The heart size and mediastinal contours are within normal limits. Both lungs are clear. The visualized skeletal structures are unremarkable. IMPRESSION: Negative one-view chest x-ray Electronically Signed   By: San Morelle M.D.   On: 01/27/2020 10:57    EKG: personally reviewed [name] interpretation is sinus rhythm.   Assessment & Plan by Problem: Active Problems:   Acute hypoxemic respiratory failure ([place])   Alisha [name] is a 69 y.o. with pertinent PMH of Asthma, CAD s/p stent), HTN, OSA, CVA, subjective history of DVT who presented with hypoxia and AMS and admit for further  evaluation and management of hypoxia and acute encephalopathy  on hospital day 0  #Acute hypoxic respiratory failure Patient presented with acute onset hypoxia.  Chest  x-ray was unremarkable.  D-dimer was elevated in the ED and subsequent VQ scan was unremarkable.  CTA was unable to be performed at this time due to acute kidney injury.  Patient was started on heparin for presumed diagnosis of pulmonary embolism.  Patient is tachypneic in the room but lung auscultation is unremarkable and patient is saturating at 100% on 2 L. -Continue supplemental oxygen as needed.  -We will order lower extremity Doppler ultrasounds to look for DVTs. -Patient's creatinine improves we will likely try to perform CTA to rule out pulmonary embolism.  #Acute Kidney Injury Patient has acute kidney injury.  She presents with a creatinine of 2.64 from a baseline of roughly 1.0. Patient has presented to the hospital with acute kidney injury related to multifactorial causes.  She was found to have prerenal azotemia secondary to NSAID use and decreased p.o. intake combined with urine retention in the setting of Myrbetriq and oxybutynin use. -We will get fractional excretion of sodium -Renal ultrasound -Give a 500 cc bolus of fluids -Bladder ultrasound if greater than 300 cc we will put in Foley catheter.  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  It is possible that the patient has a medication cause for her lichen planus.  Differential could include autoimmune, infection, etc.  -Patient received 60 mg of prednisone in the ED -We will monitor for now.  Diabetes mellitus: -Sliding scale insulin -We will likely need to closely monitor her glucose as it will invariably be elevated in the setting of prednisone use.  Diet: Carb-Modified VTE: Enoxaparin IVF: LR,Bolus Code: Full  Prior to Admission Living Arrangement: SNF, . Anticipated Discharge Location: SNF Barriers to Discharge: Valuation management  Dispo: Admit patient to Inpatient with expected length of stay greater than 2 midnights.  Signed: Marianna Payment, MD 01/27/2020, 6:38 PM  Pager: [phone]

## 2020-01-27 NOTE — ED Triage Notes (Signed)
Pt arrives via [facility] from [place] with reports of O2 74% on baseline 2L. Facility titrated O2 to 4L. Pt has rash on both arms and legs. Pt states she fell yesterday getting out of bed.

## 2020-01-28 ENCOUNTER — Encounter (HOSPITAL_COMMUNITY): Payer: Medicare Other

## 2020-01-28 DIAGNOSIS — E873 Alkalosis: Secondary | ICD-10-CM | POA: Diagnosis present

## 2020-01-28 DIAGNOSIS — R21 Rash and other nonspecific skin eruption: Secondary | ICD-10-CM | POA: Diagnosis present

## 2020-01-28 DIAGNOSIS — Z20822 Contact with and (suspected) exposure to covid-19: Secondary | ICD-10-CM | POA: Diagnosis present

## 2020-01-28 DIAGNOSIS — Z955 Presence of coronary angioplasty implant and graft: Secondary | ICD-10-CM | POA: Diagnosis not present

## 2020-01-28 DIAGNOSIS — G9349 Other encephalopathy: Secondary | ICD-10-CM | POA: Diagnosis present

## 2020-01-28 DIAGNOSIS — N179 Acute kidney failure, unspecified: Principal | ICD-10-CM

## 2020-01-28 DIAGNOSIS — L89152 Pressure ulcer of sacral region, stage 2: Secondary | ICD-10-CM | POA: Diagnosis present

## 2020-01-28 DIAGNOSIS — Z6841 Body Mass Index (BMI) 40.0 and over, adult: Secondary | ICD-10-CM | POA: Diagnosis not present

## 2020-01-28 DIAGNOSIS — Z96653 Presence of artificial knee joint, bilateral: Secondary | ICD-10-CM | POA: Diagnosis present

## 2020-01-28 DIAGNOSIS — E1151 Type 2 diabetes mellitus with diabetic peripheral angiopathy without gangrene: Secondary | ICD-10-CM | POA: Diagnosis present

## 2020-01-28 DIAGNOSIS — N390 Urinary tract infection, site not specified: Secondary | ICD-10-CM | POA: Diagnosis present

## 2020-01-28 DIAGNOSIS — T39395A Adverse effect of other nonsteroidal anti-inflammatory drugs [NSAID], initial encounter: Secondary | ICD-10-CM | POA: Diagnosis present

## 2020-01-28 DIAGNOSIS — Z888 Allergy status to other drugs, medicaments and biological substances status: Secondary | ICD-10-CM | POA: Diagnosis not present

## 2020-01-28 DIAGNOSIS — I2510 Atherosclerotic heart disease of native coronary artery without angina pectoris: Secondary | ICD-10-CM | POA: Diagnosis present

## 2020-01-28 DIAGNOSIS — Z880 Allergy status to penicillin: Secondary | ICD-10-CM | POA: Diagnosis not present

## 2020-01-28 DIAGNOSIS — I10 Essential (primary) hypertension: Secondary | ICD-10-CM | POA: Diagnosis present

## 2020-01-28 DIAGNOSIS — L432 Lichenoid drug reaction: Secondary | ICD-10-CM | POA: Diagnosis present

## 2020-01-28 DIAGNOSIS — Z8249 Family history of ischemic heart disease and other diseases of the circulatory system: Secondary | ICD-10-CM | POA: Diagnosis not present

## 2020-01-28 DIAGNOSIS — G4733 Obstructive sleep apnea (adult) (pediatric): Secondary | ICD-10-CM | POA: Diagnosis present

## 2020-01-28 DIAGNOSIS — E785 Hyperlipidemia, unspecified: Secondary | ICD-10-CM | POA: Diagnosis present

## 2020-01-28 DIAGNOSIS — J9601 Acute respiratory failure with hypoxia: Secondary | ICD-10-CM | POA: Diagnosis present

## 2020-01-28 DIAGNOSIS — Z886 Allergy status to analgesic agent status: Secondary | ICD-10-CM | POA: Diagnosis not present

## 2020-01-28 DIAGNOSIS — E11649 Type 2 diabetes mellitus with hypoglycemia without coma: Secondary | ICD-10-CM | POA: Diagnosis not present

## 2020-01-28 DIAGNOSIS — L8990 Pressure ulcer of unspecified site, unspecified stage: Secondary | ICD-10-CM | POA: Insufficient documentation

## 2020-01-28 DIAGNOSIS — Z881 Allergy status to other antibiotic agents status: Secondary | ICD-10-CM | POA: Diagnosis not present

## 2020-01-28 LAB — COMPREHENSIVE METABOLIC PANEL
ALT: 23 U/L (ref 0–44)
AST: 26 U/L (ref 15–41)
Albumin: 2.6 g/dL — ABNORMAL LOW (ref 3.5–5.0)
Alkaline Phosphatase: 88 U/L (ref 38–126)
Anion gap: 12 (ref 5–15)
BUN: 34 mg/dL — ABNORMAL HIGH (ref 8–23)
CO2: 16 mmol/L — ABNORMAL LOW (ref 22–32)
Calcium: 9.1 mg/dL (ref 8.9–10.3)
Chloride: 110 mmol/L (ref 98–111)
Creatinine, Ser: 2.21 mg/dL — ABNORMAL HIGH (ref 0.44–1.00)
GFR calc Af Amer: 25 mL/min — ABNORMAL LOW (ref >60)
GFR calc non Af Amer: 22 mL/min — ABNORMAL LOW (ref >60)
Glucose, Bld: 87 mg/dL (ref 70–99)
Potassium: 4.8 mmol/L (ref 3.5–5.1)
Sodium: 138 mmol/L (ref 135–145)
Total Bilirubin: 0.7 mg/dL (ref 0.3–1.2)
Total Protein: 7.5 g/dL (ref 6.5–8.1)

## 2020-01-28 LAB — URINALYSIS, COMPLETE (UACMP) WITH MICROSCOPIC
Bilirubin Urine: NEGATIVE
Glucose, UA: NEGATIVE mg/dL
Ketones, ur: NEGATIVE mg/dL
Nitrite: NEGATIVE
Protein, ur: 30 mg/dL — AB
Specific Gravity, Urine: 1.014 (ref 1.005–1.030)
WBC, UA: >50 WBC/hpf — ABNORMAL HIGH (ref 0–5)
pH: 5 (ref 5.0–8.0)

## 2020-01-28 LAB — CBC WITH DIFFERENTIAL/PLATELET
Abs Immature Granulocytes: 0.05 10*3/uL (ref 0.00–0.07)
Basophils Absolute: 0 10*3/uL (ref 0.0–0.1)
Basophils Relative: 0 %
Eosinophils Absolute: 0 10*3/uL (ref 0.0–0.5)
Eosinophils Relative: 0 %
HCT: 32.3 % — ABNORMAL LOW (ref 36.0–46.0)
Hemoglobin: 10.5 g/dL — ABNORMAL LOW (ref 12.0–15.0)
Immature Granulocytes: 1 %
Lymphocytes Relative: 20 %
Lymphs Abs: 1.6 10*3/uL (ref 0.7–4.0)
MCH: 28.9 pg (ref 26.0–34.0)
MCHC: 32.5 g/dL (ref 30.0–36.0)
MCV: 89 fL (ref 80.0–100.0)
Monocytes Absolute: 0.5 10*3/uL (ref 0.1–1.0)
Monocytes Relative: 6 %
Neutro Abs: 6 10*3/uL (ref 1.7–7.7)
Neutrophils Relative %: 73 %
Platelets: 321 10*3/uL (ref 150–400)
RBC: 3.63 MIL/uL — ABNORMAL LOW (ref 3.87–5.11)
RDW: 15.7 % — ABNORMAL HIGH (ref 11.5–15.5)
WBC: 8.2 10*3/uL (ref 4.0–10.5)
nRBC: 0 % (ref 0.0–0.2)

## 2020-01-28 LAB — BLOOD GAS, ARTERIAL
Acid-base deficit: 7 mmol/L — ABNORMAL HIGH (ref 0.0–2.0)
Bicarbonate: 16.4 mmol/L — ABNORMAL LOW (ref 20.0–28.0)
Drawn by: 28099
FIO2: 36
O2 Saturation: 98.1 %
Patient temperature: 37
pCO2 arterial: 24.4 mmHg — ABNORMAL LOW (ref 32.0–48.0)
pH, Arterial: 7.441 (ref 7.350–7.450)
pO2, Arterial: 107 mmHg (ref 83.0–108.0)

## 2020-01-28 LAB — GLUCOSE, CAPILLARY
Glucose-Capillary: 86 mg/dL (ref 70–99)
Glucose-Capillary: 54 mg/dL — ABNORMAL LOW (ref 70–99)
Glucose-Capillary: 30 mg/dL — CL (ref 70–99)
Glucose-Capillary: 51 mg/dL — ABNORMAL LOW (ref 70–99)
Glucose-Capillary: 56 mg/dL — ABNORMAL LOW (ref 70–99)
Glucose-Capillary: 42 mg/dL — CL (ref 70–99)
Glucose-Capillary: 45 mg/dL — ABNORMAL LOW (ref 70–99)
Glucose-Capillary: 85 mg/dL (ref 70–99)
Glucose-Capillary: 43 mg/dL — CL (ref 70–99)
Glucose-Capillary: 83 mg/dL (ref 70–99)

## 2020-01-28 LAB — SODIUM, URINE, RANDOM: Sodium, Ur: 42 mmol/L

## 2020-01-28 LAB — MRSA PCR SCREENING: MRSA by PCR: POSITIVE — AB

## 2020-01-28 LAB — CREATININE, URINE, RANDOM: Creatinine, Urine: 175.09 mg/dL

## 2020-01-28 LAB — PHOSPHORUS: Phosphorus: 3.9 mg/dL (ref 2.5–4.6)

## 2020-01-28 MED ORDER — SODIUM CHLORIDE 0.9 % IV SOLN
1.0000 g | INTRAVENOUS | Status: DC
  Administered 2020-01-28 – 2020-01-29 (×2): 1 g via INTRAVENOUS
  Filled 2020-01-28 (×2): qty 10

## 2020-01-28 MED ORDER — LACTATED RINGERS IV BOLUS: 500.0000 mL | Freq: Once | INTRAVENOUS | Status: DC

## 2020-01-28 MED ORDER — CHLORHEXIDINE GLUCONATE CLOTH 2 % EX PADS
6.0000 | MEDICATED_PAD | Freq: Every day | CUTANEOUS | Status: DC
  Administered 2020-01-28 – 2020-01-29 (×2): 6 via TOPICAL

## 2020-01-28 MED ORDER — MUPIROCIN 2 % EX OINT
1.0000 "application " | TOPICAL_OINTMENT | Freq: Two times a day (BID) | CUTANEOUS | Status: DC
  Administered 2020-01-28 – 2020-01-29 (×3): 1 via NASAL
  Filled 2020-01-28: qty 22

## 2020-01-28 MED ORDER — DEXTROSE 5 % IV SOLN
INTRAVENOUS | Status: AC
  Administered 2020-01-28: 1000 mL via INTRAVENOUS

## 2020-01-28 MED ORDER — LACTATED RINGERS IV BOLUS
1000.0000 mL | Freq: Once | INTRAVENOUS | Status: AC
  Administered 2020-01-28: 1000 mL via INTRAVENOUS

## 2020-01-28 MED ORDER — DEXTROSE 50 % IV SOLN
25.0000 g | Freq: Once | INTRAVENOUS | Status: AC
  Administered 2020-01-28: 25 g via INTRAVENOUS
  Filled 2020-01-28: qty 50

## 2020-01-28 MED ORDER — DEXTROSE 50 % IV SOLN
INTRAVENOUS | Status: AC
  Filled 2020-01-28: qty 50

## 2020-01-28 MED ORDER — LACTATED RINGERS IV SOLN
INTRAVENOUS | Status: DC
  Administered 2020-01-28: 500 mL via INTRAVENOUS
  Administered 2020-01-28: 1000 mL via INTRAVENOUS

## 2020-01-28 MED ORDER — DEXTROSE 50 % IV SOLN
25.0000 g | Freq: Once | INTRAVENOUS | Status: AC
  Administered 2020-01-28: 25 g via INTRAVENOUS

## 2020-01-28 NOTE — Progress Notes (Signed)
PT Cancellation Note  Patient Details Name: Alisha [name] MRN: [id number] DOB: [birth year]   Cancelled Treatment:    Reason Eval/Treat Not Completed: Medical issues which prohibited therapy Pt with low blood sugar. Will hold until medically appropriate.   Reuel Derby, PT, DPT  Acute Rehabilitation Services  Pager: [phone] Office: [phone]    [name] 01/28/2020, 1:00 PM

## 2020-01-28 NOTE — Progress Notes (Signed)
CBG 42 before dinner, patient asymptomatic. Patient received Dextrose 50% - 25 ml per SO and CBG came up to 85. At this time patient is eating her dinner. MD informed. Will continue to monitor.

## 2020-01-28 NOTE — Progress Notes (Addendum)
CBG at noon 54. Patient asymptomatic, no complaints, no acute distress noted. Patient received Dextrose 50% - 25 ml per SO and MD was informed. At this time patient is eating lunch.  Will recheck CBG.

## 2020-01-28 NOTE — Progress Notes (Signed)
Patient stated she does not want to wear the CPAP at this facility. Patient will wear nasal cannula tonight. RT will continue to monitor.

## 2020-01-28 NOTE — Progress Notes (Signed)
HD#0 Subjective:  Overnight Events: No events overnight.  Stacia Genavieve Colavita was resting in bed. She continues to have [facility] that is most noticeable when she is talking or moving. She seems more oriented but does perseverate on certain questions and occasionally answers questions with incorrect/inappropriate answers. Otherwise, she denies news symptoms today.  Objective:  Vital signs in last 24 hours: Vitals:   01/27/20 2319 01/28/20 0000 01/28/20 0024 01/28/20 0400  BP:  140/81  (!) 142/68  Pulse:  75  79  Resp: 16 20 17  (!) 25  Temp:  98.6 F (37 C)  98.9 F (37.2 C)  TempSrc:  Axillary  Axillary  SpO2:  100%  100%  Weight:      Height:       Supplemental O2: Nasal Cannula SpO2: 100 % O2 Flow Rate (L/min): 2-4 L/min   Physical Exam:  Physical Exam  Constitutional: She is oriented to person, place, and time and well-developed, well-nourished, and in no distress.  HENT:  Head: Normocephalic and atraumatic.  Eyes: EOM are normal.  Cardiovascular: Normal rate and intact distal pulses. Exam reveals no gallop and no friction rub.  No murmur heard. Pulmonary/Chest: Effort normal and breath sounds normal. Tachypnea noted. No respiratory distress. She has no wheezes. She has no rales.  Abdominal: Soft. Bowel sounds are normal. She exhibits no distension. There is no abdominal tenderness.  Musculoskeletal:        General: Edema present. No tenderness. Normal range of motion.     Cervical back: Normal range of motion.  Neurological: She is alert and oriented to person, place, and time.  Skin: Skin is warm and dry. Rash (erythematous, nonblanching papules/macules) noted.    Filed Weights   01/27/20 1300  Weight: 118.8 kg     Intake/Output Summary (Last 24 hours) at 01/28/2020 0711 Last data filed at 01/28/2020 0000 Gross per 24 hour  Intake 594.73 ml  Output --  Net 594.73 ml   Net IO Since Admission: 594.73 mL [01/28/20 0711]  Pertinent Labs: CBC Latest Ref  Rng & Units 01/27/2020 01/27/2020 01/05/2020  WBC 4.0 - 10.5 K/uL 8.0 - 11.5(H)  Hemoglobin 12.0 - 15.0 g/dL 11.3(L) 10.2(L) 10.6(L)  Hematocrit 36.0 - 46.0 % 35.4(L) 30.0(L) 31.9(L)  Platelets 150 - 400 K/uL 386 - 183    CMP Latest Ref Rng & Units 01/27/2020 01/27/2020 01/05/2020  Glucose 70 - 99 mg/dL 94 - 94  BUN 8 - 23 mg/dL 33(H) - 8  Creatinine 0.44 - 1.00 mg/dL 2.64(H) - 0.84  Sodium 135 - 145 mmol/L 137 138 143  Potassium 3.5 - 5.1 mmol/L 4.4 4.2 3.2(L)  Chloride 98 - 111 mmol/L 105 - 114(H)  CO2 22 - 32 mmol/L 16(L) - 18(L)  Calcium 8.9 - 10.3 mg/dL 9.3 - 8.2(L)  Total Protein 6.5 - 8.1 g/dL 7.7 - -  Total Bilirubin 0.3 - 1.2 mg/dL 1.1 - -  Alkaline Phos 38 - 126 U/L 94 - -  AST 15 - 41 U/L 34 - -  ALT 0 - 44 U/L 24 - -     Pending Labs: none  Imaging: V/Q Scan: Low probability for pulmonary embolism.  US Renal: 1. No right hydronephrosis. 2. Left kidney not visualized due to body habitus.   Assessment/Plan:   Active Problems:   AKI (acute kidney injury) ([place])   Acute hypoxemic respiratory failure (HCC)    has a past medical history of AKI (acute kidney injury) ([place]), Arthritis, Asthma, Bipolar disorder ([place]),  CAD (coronary artery disease) (07/07/2016), Cancer ([place]), Depression, Fibromyalgia, GERD (gastroesophageal reflux disease), History of benign pituitary tumor, History of kidney stones, Hyperlipidemia, Hypertension, Migraine, Morbid obesity ([place]), Peripheral vascular disease ([place]), Pneumonia, Seizures ([place]), Sleep apnea, and Vertigo.  Patient Summary: Alisha [name] is a 69 y.o. with a pertinent PMH of Asthma, CAD (s/p stent), HTN, OSA, CVA, subjective history of DVT , who presented with hypoxia and AMS and admitted for further evaluation and management of hypoxia and acute encephalopathy  . He/she is on hospital day 0 and is stable.  #Acute hypoxic respiratory failure -Patient is saturating 100% on 2 L nasal cannula -VQ scan shows low probability of  pulmonary embolism -We will continue to monitor patient's respiratory status. - Past ABG showed respiratory alkalosis, concern for underlying infection with respiratory compensation.  - Repeat ABG  #Acute Kidney Injury: #Urinary tract infection:  - Patients renal function improving with fluids. She has good urinary output - UA showed evidence of UTI, which is consistent with her alter mental status and rigors. - Pending urine culture. - Start ceftriaxone 1 g IV daily.  - Continue IV fluids. LR 125 cc/hr - Continue to monitor for signs of infection.  #Rash, lichen planus - Patient has improvement of her rash with prednisone - We will like d/c steroids in the setting of infection. - Continue benadryl as needed.  lichen planus   #Diabetes mellitus: - continue SSI  Diet: Normal IVF: LR,125cc/hr VTE: Heparin Code: Full PT/OT recs: SNF for Subacute PT TOC recs: pending   Dispo: Anticipated discharge 1-2 days.    Marianna Payment, D.O. [facility], PGY-1 Date 01/28/2020 Time 7:11 AM Pager: [phone]

## 2020-01-28 NOTE — Consult Note (Addendum)
[facility] Nurse Consult Note: Reason for Consult: Consult requested for sacrum.  Pt has a stage 2 pressure injury to upper sacrum; 1.4X.5X.1cm, red and moist; present on admission. Lower gluteal fold has a partial thickness fissure 2X.1X.1cm, red and moist, related to moisture, not pressure.  Dressing procedure/placement/frequency: Topical treatment orders provided for bedside nurses to perform as follows to protect and promote healing: Foam dressing to sacrum, change Q 3 days or PRN soiling. Please re-consult if further assistance is needed.  Thank-you,  Julien Girt MSN, [place], [place], [place], [place]

## 2020-01-28 NOTE — Progress Notes (Signed)
  Date: 01/28/2020  Patient name: Alisha [name]  Medical record number: [id number]  Date of birth: 1951-05-01   I have seen and evaluated Alisha [name] and discussed their care with the Residency Team.  In brief, patient is 69 year old female with a past medical history of asthma, CAD status post stent, hypertension, OSA, CVA, possible DVT who presented to the ED with shortness of breath from her SNF.  History obtained from chart as patient is unable to provide a good history at this time.  Per chart, patient complains of progressive shortness of breath at her SNF facility and EMS was called.  EMS noted patient to be hypoxic with O2 saturation in the 70s.  Resident called daughter who states that patient had a similar episode approximately 1 month ago and was admitted for this.  Daughter also stated the patient had decreased appetite and has problems with stress incontinence.  In the ED here, patient was noted to have AKI with creatinine up to 2.6 as well as acute encephalopathy and was admitted for further evaluation.  Today, patient is able to answer orientation questions but perseverates and appears confused when asked more detailed questions.  PMHx, Fam Hx, and/or Soc Hx : As per resident note  Vitals:   01/28/20 0916 01/28/20 1400  BP:    Pulse:  80  Resp:  15  Temp:    SpO2: 94% 100%   General: Awake, alert, oriented x3 but confused with more detailed questions CVS: Regular rhythm, normal heart sounds Lungs: CTA bilaterally, intermittently tachypneic Abdomen: Soft, nontender, nondistended, normal bowel sounds Extremities: No edema noted, nontender to palpation HEENT: Normocephalic, atraumatic Neuro: Able to answer orientation questions but confused with more detailed questions and perseverates on certain answers (kept repeating 21), patient also noted to have tremors of moving her upper extremities Skin: Patient noted to have an erythematous, nonblanching  maculopapular rash on her upper and lower extremities  Assessment and Plan: I have seen and evaluated the patient as outlined above. I agree with the formulated Assessment and Plan as detailed in the residents' note, with the following changes:   1.  Acute encephalopathy likely secondary to underlying UTI: -I suspect that the patient confusion is secondary to an underlying infection.  Her UA showed evidence of UTI and she has had persistent episodes of hypoglycemia here consistent with an underlying infection. -We will follow-up urine culture -Continue ceftriaxone for now -We will start the patient on D5W gtt. for her persistent hypoglycemic episodes -We will continue to monitor closely  2.  Acute hypoxic respiratory failure: -Patient presented to ED with progressive shortness of breath from her SNF.  However, patient's O2 sats here have been 100% on 2 L nasal cannula -It is possible that the patient's shortness of breath is subjective in nature as she appears comfortable while resting and is able to speak in full sentences.  Patient's O2 sats here have been 100% on room air and her ABG showed a PO2 of 107 -Chest x-ray with no acute pathology and VQ scan showed low probability of PE -No further work-up at this time  Aldine Contes, MD 6/2/20213:18 PM

## 2020-01-28 NOTE — Progress Notes (Signed)
CBG continue to be low. After lunch and Dextrose 50 % CBG was 56. Patient asymptomatic; no complaints. MD made aware. Will continue to monitor.

## 2020-01-28 NOTE — Progress Notes (Signed)
Notified by Lab, Patient is MRSA positive.

## 2020-01-29 ENCOUNTER — Inpatient Hospital Stay (HOSPITAL_COMMUNITY): Payer: Medicare Other

## 2020-01-29 DIAGNOSIS — R21 Rash and other nonspecific skin eruption: Secondary | ICD-10-CM | POA: Insufficient documentation

## 2020-01-29 DIAGNOSIS — J9601 Acute respiratory failure with hypoxia: Secondary | ICD-10-CM

## 2020-01-29 LAB — COMPREHENSIVE METABOLIC PANEL
ALT: 24 U/L (ref 0–44)
AST: 35 U/L (ref 15–41)
Albumin: 2.4 g/dL — ABNORMAL LOW (ref 3.5–5.0)
Alkaline Phosphatase: 76 U/L (ref 38–126)
Anion gap: 9 (ref 5–15)
BUN: 30 mg/dL — ABNORMAL HIGH (ref 8–23)
CO2: 21 mmol/L — ABNORMAL LOW (ref 22–32)
Calcium: 9.2 mg/dL (ref 8.9–10.3)
Chloride: 111 mmol/L (ref 98–111)
Creatinine, Ser: 1.62 mg/dL — ABNORMAL HIGH (ref 0.44–1.00)
GFR calc Af Amer: 37 mL/min — ABNORMAL LOW (ref >60)
GFR calc non Af Amer: 32 mL/min — ABNORMAL LOW (ref >60)
Glucose, Bld: 99 mg/dL (ref 70–99)
Potassium: 4.5 mmol/L (ref 3.5–5.1)
Sodium: 141 mmol/L (ref 135–145)
Total Bilirubin: 0.7 mg/dL (ref 0.3–1.2)
Total Protein: 6.8 g/dL (ref 6.5–8.1)

## 2020-01-29 LAB — CBC
HCT: 30.8 % — ABNORMAL LOW (ref 36.0–46.0)
Hemoglobin: 9.6 g/dL — ABNORMAL LOW (ref 12.0–15.0)
MCH: 28.7 pg (ref 26.0–34.0)
MCHC: 31.2 g/dL (ref 30.0–36.0)
MCV: 91.9 fL (ref 80.0–100.0)
Platelets: 299 10*3/uL (ref 150–400)
RBC: 3.35 MIL/uL — ABNORMAL LOW (ref 3.87–5.11)
RDW: 15.8 % — ABNORMAL HIGH (ref 11.5–15.5)
WBC: 8 10*3/uL (ref 4.0–10.5)
nRBC: 0 % (ref 0.0–0.2)

## 2020-01-29 LAB — GLUCOSE, CAPILLARY
Glucose-Capillary: 126 mg/dL — ABNORMAL HIGH (ref 70–99)
Glucose-Capillary: 18 mg/dL — CL (ref 70–99)
Glucose-Capillary: 42 mg/dL — CL (ref 70–99)
Glucose-Capillary: 64 mg/dL — ABNORMAL LOW (ref 70–99)
Glucose-Capillary: 84 mg/dL (ref 70–99)
Glucose-Capillary: 84 mg/dL (ref 70–99)
Glucose-Capillary: 99 mg/dL (ref 70–99)

## 2020-01-29 MED ORDER — DEXTROSE 50 % IV SOLN
INTRAVENOUS | Status: AC
  Filled 2020-01-29: qty 50

## 2020-01-29 MED ORDER — DEXTROSE 50 % IV SOLN
25.0000 g | Freq: Once | INTRAVENOUS | Status: AC
  Administered 2020-01-29: 25 g via INTRAVENOUS

## 2020-01-29 MED ORDER — DIPHENHYDRAMINE HCL 50 MG PO TABS
25.0000 mg | ORAL_TABLET | Freq: Four times a day (QID) | ORAL | 0 refills | Status: AC | PRN
Start: 2020-01-29 — End: ?

## 2020-01-29 MED ORDER — CEFDINIR 300 MG PO CAPS
300.0000 mg | ORAL_CAPSULE | Freq: Two times a day (BID) | ORAL | 0 refills | Status: AC
Start: 2020-01-29 — End: 2020-02-01

## 2020-01-29 NOTE — TOC Transition Note (Signed)
Transition of Care [facility]) - CM/SW Discharge Note   Patient Details  Name: Alisha [name] MRN: [id number] Date of Birth: 08/17/1951  [facility] of [facility]) CM/SW Contact:  Benard Halsted, [place] Phone Number: 01/29/2020, 3:06 PM   Clinical Narrative:    Patient will DC to: [facility] Anticipated DC date: 01/29/20 Family notified: Daughter, Chief Executive Officer by: Corey Harold   Per MD patient ready for DC to [place]. RN, patient, patient's family, and facility notified of DC. Discharge Summary and FL2 sent to facility. RN to call report prior to discharge [id number] Room 137B). DC packet on chart. Ambulance transport requested for patient.   CSW will sign off for now as social work intervention is no longer needed. Please consult [place] again if new needs arise.      Final next level of care: Skilled Nursing Facility Barriers to Discharge: No Barriers Identified   Patient Goals and CMS Choice Patient states their goals for this hospitalization and ongoing recovery are:: Return to SNF CMS Medicare.gov Compare Post Acute Care list provided to:: Patient(and daughter) Choice offered to / list presented to : Patient, Adult Children  Discharge Placement   Existing PASRR number confirmed : 01/29/20          Patient chooses bed at: [facility] Patient to be transferred to facility by: [place] Name of family member notified: [place] Patient and family notified of of transfer: 01/29/20  Discharge Plan and Services In-house Referral: Clinical Social Work   Post Acute Care Choice: [facility]                               Social Determinants of Health (SDOH) Interventions     Readmission Risk Interventions Readmission Risk Prevention Plan 01/29/2020 12/08/2019  Transportation Screening Complete Complete  PCP or Specialist Appt within 5-7 Days - Not Complete  Not Complete comments - plan for SNF  PCP or Specialist Appt within  3-5 Days Complete -  Home Care Screening - Complete  Medication Review (RN CM) - Referral to Pharmacy  [place] or Home Care Consult Complete -  Social Work Consult for [place] Planning/Counseling Complete -  [place] Screening Not Applicable -  Medication Review Press photographer) Complete -  Some recent data might be hidden

## 2020-01-29 NOTE — Discharge Summary (Addendum)
Name: Alisha [name] MRN: [id number] DOB: Feb 04, 1951 69 y.o. PCP: Sonia Side., FNP  Date of Admission: 01/27/2020 10:22 AM Date of Discharge:  01/29/20 Attending Physician: Aldine Contes, MD  Discharge Diagnosis: 1. Acute hypoxic respiratory failure 2. Acute Kidney Injury 3. Urinary tract infection 4. Rash, lichen planus 5. Diabetes mellitus  Discharge Medications: Allergies as of 01/29/2020      Reactions   Ibuprofen Other (See Comments)   Upset Stomach   Ampicillin Nausea Only   Asa [aspirin] Diarrhea, Other (See Comments)   Upset Stomach   Darvon [propoxyphene] Nausea And Vomiting   Demeclocycline Other (See Comments)   Nerves feeling   Eggs Or Egg-derived Products Diarrhea   Lactalbumin Diarrhea   Lisinopril Cough   Milk-related Compounds Diarrhea   Salicylates Other (See Comments)   Unknown   Tetracycline Hcl Hives, Nausea Only   Tetracyclines & Related Other (See Comments)   Nerves feeling      Medication List    STOP taking these medications   oxybutynin 10 MG 24 hr tablet Commonly known as: DITROPAN-XL     TAKE these medications   Ventolin HFA 108 (90 Base) MCG/ACT inhaler Generic drug: albuterol Inhale 1 puff into the lungs every 6 (six) hours as needed for wheezing or shortness of breath.   albuterol (2.5 MG/3ML) 0.083% nebulizer solution Commonly known as: PROVENTIL Take 3 mLs (2.5 mg total) by nebulization every 6 (six) hours as needed for wheezing or shortness of breath.   aspirin EC 81 MG tablet Take 81 mg by mouth daily.   atorvastatin 80 MG tablet Commonly known as: LIPITOR Take 80 mg by mouth at bedtime.   budesonide-formoterol 80-4.5 MCG/ACT inhaler Commonly known as: Symbicort Inhale 2 puffs into the lungs 2 (two) times daily.   cefdinir 300 MG capsule Commonly known as: OMNICEF Take 1 capsule (300 mg total) by mouth 2 (two) times daily for 3 days.   clopidogrel 75 MG tablet Commonly known as: PLAVIX Take 1  tablet (75 mg total) by mouth daily.   DULoxetine 60 MG capsule Commonly known as: Cymbalta Take 1 capsule (60 mg total) by mouth daily.   Fluticasone-Salmeterol 250-50 MCG/DOSE Aepb Commonly known as: ADVAIR Inhale 1 puff into the lungs 2 (two) times daily.   gabapentin 300 MG capsule Commonly known as: NEURONTIN Take 300 mg by mouth 3 (three) times daily.   loratadine 10 MG tablet Commonly known as: CLARITIN Take 10 mg by mouth daily.   losartan 100 MG tablet Commonly known as: COZAAR Take 1 tablet (100 mg total) daily by mouth.   metFORMIN 500 MG tablet Commonly known as: GLUCOPHAGE Take 500 mg by mouth 2 (two) times daily with a meal.   ondansetron 4 MG tablet Commonly known as: ZOFRAN Take 4 mg by mouth every 6 (six) hours as needed for nausea or vomiting.   pantoprazole 40 MG tablet Commonly known as: PROTONIX Take 40 mg by mouth daily.   predniSONE 10 MG tablet Commonly known as: DELTASONE Take 10 mg by mouth daily.   Spiriva Respimat 1.25 MCG/ACT Aers Generic drug: Tiotropium Bromide Monohydrate Inhale 2 puffs into the lungs daily.   topiramate 50 MG tablet Commonly known as: TOPAMAX Take 50 mg by mouth at bedtime.       Disposition and follow-up:   [name] was discharged from [facility] in Stable condition.  At the hospital follow up visit please address:  1.  Follow-up:  A. UTI -  She has three more days of cefdinir to complete five days of antibiotics.   B. AKI - I stopped her oxybutinin as she has had a history of urinary retention. Make sure AKI has resolved. Her losartan may need to be stopped if her renal dysfunction persists.  C. Rash - may need dermatology follow up vs steroids for rash after UTI resolves. Please use benadryl 25 mg q6h as needed. Please hold if there is any confusion.   2.  Labs / imaging needed at time of follow-up: BMP  3.  Pending labs/ test needing follow-up: Urine  Culture  Follow-up Appointments: Follow-up Information    Sonia Side., FNP. Schedule an appointment as soon as possible for a visit in 1 week(s).   Specialty: Family Medicine Contact information: [address] [id number] [place] Hospital Course by problem list:  1. Acute hypoxic respiratory failure Alisha [name] is a 69 y.o. with a pertinent PMH of Asthma, CAD (s/p stent), HTN, OSA, CVA, subjective history of DVT, who presented with hypoxia in the 73s and AMS. She was tachypneic on presentation but satting well on supplemental oxygen. Chest x-ray was unremarkable.  D-dimer was elevated in the ED and subsequent VQ scan was unremarkable. CTA was unable to be performed at this time due to acute kidney injury. Patient was initially started on heparin for possible pulmonary embolism.  Given ultrasounds negative for DVT and oxygenation was improving, heparin stopped. Unclear etiology for her hypoxia. She has OSA and used CPAP here but you would expect hypercarbia rather than hypoxia. Throughout her stay she was weaned off oxygen and satting 100% on RA prior to discharge.   2. Acute Kidney Injury 3. Urinary tract infection Patient presented with a creatinine of 2.64 from a baseline of roughly 1.0 originally thought to be due to prerenal azotemia secondary to NSAID use and decreased p.o. intake combined with urine retention in the setting of Myrbetriq and oxybutynin use. Renal function improved with fluids which also improved urinary output. Creatinine 1.62 on day of discharge. Once UA collected it demonstrated evidence of UTI, which would explain the AMS when she presented. She was started on ceftriaxone 1 g IV daily and switched to cefdinir prior to discharge.  4. Rash, lichen planus Patient incidentally found to have new rash, which appears consistent with lichen planus, possibly due to a medication side effect vs autoimmune process or infection.  Received 60 mg of prednisone in the ED with improvement in her rash. Steroids not continued in setting of her infection. Continued on benadryl as needed. May need dermatology follow up.  5. Diabetes mellitus -Treated with SSI while admitted -closely monitored in setting of prednisone use   Discharge Vitals:   BP 135/75   Pulse 74   Temp 98.7 F (37.1 C) (Oral)   Resp (!) 21   Ht 5\' 6"  (1.676 m)   Wt 118.8 kg   SpO2 100%   BMI 42.29 kg/m   Pertinent Labs, Studies, and Procedures:  Results for orders placed or performed during the hospital encounter of 01/27/20 (from the past 168 hour(s))  I-STAT 7, (LYTES, BLD GAS, ICA, H+H)   Collection Time: 01/27/20 11:09 AM  Result Value Ref Range   pH, Arterial 7.492 (H) 7.350 - 7.450   pCO2 arterial 22.1 (L) 32.0 - 48.0 mmHg   pO2, Arterial 175 (H) 83.0 - 108.0 mmHg   Bicarbonate 16.9 (L) 20.0 -  28.0 mmol/L   TCO2 18 (L) 22 - 32 mmol/L   O2 Saturation 100.0 %   Acid-base deficit 5.0 (H) 0.0 - 2.0 mmol/L   Sodium 138 135 - 145 mmol/L   Potassium 4.2 3.5 - 5.1 mmol/L   Calcium, Ion 1.22 1.15 - 1.40 mmol/L   HCT 30.0 (L) 36.0 - 46.0 %   Hemoglobin 10.2 (L) 12.0 - 15.0 g/dL   Collection site Radial    Drawn by RT    Sample type ARTERIAL   CBC   Collection Time: 01/27/20 11:25 AM  Result Value Ref Range   WBC 8.0 4.0 - 10.5 K/uL   RBC 3.85 (L) 3.87 - 5.11 MIL/uL   Hemoglobin 11.3 (L) 12.0 - 15.0 g/dL   HCT 35.4 (L) 36.0 - 46.0 %   MCV 91.9 80.0 - 100.0 fL   MCH 29.4 26.0 - 34.0 pg   MCHC 31.9 30.0 - 36.0 g/dL   RDW 15.9 (H) 11.5 - 15.5 %   Platelets 386 150 - 400 K/uL   nRBC 0.0 0.0 - 0.2 %  Comprehensive metabolic panel   Collection Time: 01/27/20 11:25 AM  Result Value Ref Range   Sodium 137 135 - 145 mmol/L   Potassium 4.4 3.5 - 5.1 mmol/L   Chloride 105 98 - 111 mmol/L   CO2 16 (L) 22 - 32 mmol/L   Glucose, Bld 94 70 - 99 mg/dL   BUN 33 (H) 8 - 23 mg/dL   Creatinine, Ser 2.64 (H) 0.44 - 1.00 mg/dL   Calcium 9.3 8.9 -  10.3 mg/dL   Total Protein 7.7 6.5 - 8.1 g/dL   Albumin 2.7 (L) 3.5 - 5.0 g/dL   AST 34 15 - 41 U/L   ALT 24 0 - 44 U/L   Alkaline Phosphatase 94 38 - 126 U/L   Total Bilirubin 1.1 0.3 - 1.2 mg/dL   GFR calc non Af Amer 18 (L) >60 mL/min   GFR calc Af Amer 20 (L) >60 mL/min   Anion gap 16 (H) 5 - 15  D-dimer, quantitative (not at [facility])   Collection Time: 01/27/20 11:25 AM  Result Value Ref Range   D-Dimer, Quant 12.46 (H) 0.00 - 0.50 ug/mL-FEU  SARS Coronavirus 2 by RT PCR (hospital order, performed in [place] hospital lab) Nasopharyngeal Nasopharyngeal Swab   Collection Time: 01/27/20  4:01 PM   Specimen: Nasopharyngeal Swab  Result Value Ref Range   SARS Coronavirus 2 NEGATIVE NEGATIVE  Heparin level (unfractionated)   Collection Time: 01/27/20  9:37 PM  Result Value Ref Range   Heparin Unfractionated 1.06 (H) 0.30 - 0.70 IU/mL  MRSA PCR Screening   Collection Time: 01/27/20 11:30 PM   Specimen: Nasal Mucosa; Nasopharyngeal  Result Value Ref Range   MRSA by PCR POSITIVE (A) NEGATIVE  Glucose, capillary   Collection Time: 01/28/20  7:43 AM  Result Value Ref Range   Glucose-Capillary 86 70 - 99 mg/dL  CBC WITH DIFFERENTIAL   Collection Time: 01/28/20  9:29 AM  Result Value Ref Range   WBC 8.2 4.0 - 10.5 K/uL   RBC 3.63 (L) 3.87 - 5.11 MIL/uL   Hemoglobin 10.5 (L) 12.0 - 15.0 g/dL   HCT 32.3 (L) 36.0 - 46.0 %   MCV 89.0 80.0 - 100.0 fL   MCH 28.9 26.0 - 34.0 pg   MCHC 32.5 30.0 - 36.0 g/dL   RDW 15.7 (H) 11.5 - 15.5 %   Platelets 321 150 - 400 K/uL  nRBC 0.0 0.0 - 0.2 %   Neutrophils Relative % 73 %   Neutro Abs 6.0 1.7 - 7.7 K/uL   Lymphocytes Relative 20 %   Lymphs Abs 1.6 0.7 - 4.0 K/uL   Monocytes Relative 6 %   Monocytes Absolute 0.5 0.1 - 1.0 K/uL   Eosinophils Relative 0 %   Eosinophils Absolute 0.0 0.0 - 0.5 K/uL   Basophils Relative 0 %   Basophils Absolute 0.0 0.0 - 0.1 K/uL   Immature Granulocytes 1 %   Abs Immature Granulocytes 0.05 0.00 - 0.07  K/uL  Comprehensive metabolic panel   Collection Time: 01/28/20  9:29 AM  Result Value Ref Range   Sodium 138 135 - 145 mmol/L   Potassium 4.8 3.5 - 5.1 mmol/L   Chloride 110 98 - 111 mmol/L   CO2 16 (L) 22 - 32 mmol/L   Glucose, Bld 87 70 - 99 mg/dL   BUN 34 (H) 8 - 23 mg/dL   Creatinine, Ser 2.21 (H) 0.44 - 1.00 mg/dL   Calcium 9.1 8.9 - 10.3 mg/dL   Total Protein 7.5 6.5 - 8.1 g/dL   Albumin 2.6 (L) 3.5 - 5.0 g/dL   AST 26 15 - 41 U/L   ALT 23 0 - 44 U/L   Alkaline Phosphatase 88 38 - 126 U/L   Total Bilirubin 0.7 0.3 - 1.2 mg/dL   GFR calc non Af Amer 22 (L) >60 mL/min   GFR calc Af Amer 25 (L) >60 mL/min   Anion gap 12 5 - 15  Phosphorus   Collection Time: 01/28/20  9:29 AM  Result Value Ref Range   Phosphorus 3.9 2.5 - 4.6 mg/dL  Urinalysis, Complete w Microscopic   Collection Time: 01/28/20 10:43 AM  Result Value Ref Range   Color, Urine AMBER (A) YELLOW   APPearance CLOUDY (A) CLEAR   Specific Gravity, Urine 1.014 1.005 - 1.030   pH 5.0 5.0 - 8.0   Glucose, UA NEGATIVE NEGATIVE mg/dL   Hgb urine dipstick SMALL (A) NEGATIVE   Bilirubin Urine NEGATIVE NEGATIVE   Ketones, ur NEGATIVE NEGATIVE mg/dL   Protein, ur 30 (A) NEGATIVE mg/dL   Nitrite NEGATIVE NEGATIVE   Leukocytes,Ua LARGE (A) NEGATIVE   RBC / HPF 0-5 0 - 5 RBC/hpf   WBC, UA >50 (H) 0 - 5 WBC/hpf   Bacteria, UA MANY (A) NONE SEEN   Squamous Epithelial / LPF 0-5 0 - 5  Sodium, urine, random   Collection Time: 01/28/20 10:43 AM  Result Value Ref Range   Sodium, Ur 42 mmol/L  Creatinine, urine, random   Collection Time: 01/28/20 10:43 AM  Result Value Ref Range   Creatinine, Urine 175.09 mg/dL  Blood gas, arterial   Collection Time: 01/28/20 10:45 AM  Result Value Ref Range   FIO2 36.00    pH, Arterial 7.441 7.350 - 7.450   pCO2 arterial 24.4 (L) 32.0 - 48.0 mmHg   pO2, Arterial 107 83.0 - 108.0 mmHg   Bicarbonate 16.4 (L) 20.0 - 28.0 mmol/L   Acid-base deficit 7.0 (H) 0.0 - 2.0 mmol/L   O2  Saturation 98.1 %   Patient temperature 37.0    Collection site REVIEWED BY    Drawn by [id number]    Sample type ARTERIAL DRAW    Allens test (pass/fail) BRACHIAL ARTERY (A) PASS  Glucose, capillary   Collection Time: 01/28/20 11:28 AM  Result Value Ref Range   Glucose-Capillary 54 (L) 70 - 99 mg/dL  Culture, Urine  Collection Time: 01/28/20 12:48 PM   Specimen: Urine, Random  Result Value Ref Range   Specimen Description URINE, RANDOM    Special Requests NONE    Culture (A)     >=100,000 COLONIES/mL KLEBSIELLA PNEUMONIAE SUSCEPTIBILITIES TO FOLLOW Performed at [facility], [place] [address], [place], [place] [address]    Report Status PENDING   Glucose, capillary   Collection Time: 01/28/20  1:09 PM  Result Value Ref Range   Glucose-Capillary 30 (LL) 70 - 99 mg/dL   Comment 1 Notify RN   Glucose, capillary   Collection Time: 01/28/20  2:16 PM  Result Value Ref Range   Glucose-Capillary 56 (L) 70 - 99 mg/dL  Glucose, capillary   Collection Time: 01/28/20  2:20 PM  Result Value Ref Range   Glucose-Capillary 51 (L) 70 - 99 mg/dL  Glucose, capillary   Collection Time: 01/28/20  4:28 PM  Result Value Ref Range   Glucose-Capillary 18 (LL) 70 - 99 mg/dL  Glucose, capillary   Collection Time: 01/28/20  4:31 PM  Result Value Ref Range   Glucose-Capillary 42 (LL) 70 - 99 mg/dL   Comment 1 Notify RN   Glucose, capillary   Collection Time: 01/28/20  5:19 PM  Result Value Ref Range   Glucose-Capillary 42 (LL) 70 - 99 mg/dL   Comment 1 Notify RN   Glucose, capillary   Collection Time: 01/28/20  5:23 PM  Result Value Ref Range   Glucose-Capillary 45 (L) 70 - 99 mg/dL  Glucose, capillary   Collection Time: 01/28/20  6:08 PM  Result Value Ref Range   Glucose-Capillary 85 70 - 99 mg/dL  Glucose, capillary   Collection Time: 01/28/20  8:11 PM  Result Value Ref Range   Glucose-Capillary 43 (LL) 70 - 99 mg/dL   Comment 1 Notify RN   Glucose, capillary   Collection Time:  01/28/20  8:52 PM  Result Value Ref Range   Glucose-Capillary 83 70 - 99 mg/dL  Glucose, capillary   Collection Time: 01/29/20 12:31 AM  Result Value Ref Range   Glucose-Capillary 99 70 - 99 mg/dL  Glucose, capillary   Collection Time: 01/29/20  4:48 AM  Result Value Ref Range   Glucose-Capillary 84 70 - 99 mg/dL  CBC   Collection Time: 01/29/20  6:13 AM  Result Value Ref Range   WBC 8.0 4.0 - 10.5 K/uL   RBC 3.35 (L) 3.87 - 5.11 MIL/uL   Hemoglobin 9.6 (L) 12.0 - 15.0 g/dL   HCT 30.8 (L) 36.0 - 46.0 %   MCV 91.9 80.0 - 100.0 fL   MCH 28.7 26.0 - 34.0 pg   MCHC 31.2 30.0 - 36.0 g/dL   RDW 15.8 (H) 11.5 - 15.5 %   Platelets 299 150 - 400 K/uL   nRBC 0.0 0.0 - 0.2 %  Comprehensive metabolic panel   Collection Time: 01/29/20  6:13 AM  Result Value Ref Range   Sodium 141 135 - 145 mmol/L   Potassium 4.5 3.5 - 5.1 mmol/L   Chloride 111 98 - 111 mmol/L   CO2 21 (L) 22 - 32 mmol/L   Glucose, Bld 99 70 - 99 mg/dL   BUN 30 (H) 8 - 23 mg/dL   Creatinine, Ser 1.62 (H) 0.44 - 1.00 mg/dL   Calcium 9.2 8.9 - 10.3 mg/dL   Total Protein 6.8 6.5 - 8.1 g/dL   Albumin 2.4 (L) 3.5 - 5.0 g/dL   AST 35 15 - 41 U/L  ALT 24 0 - 44 U/L   Alkaline Phosphatase 76 38 - 126 U/L   Total Bilirubin 0.7 0.3 - 1.2 mg/dL   GFR calc non Af Amer 32 (L) >60 mL/min   GFR calc Af Amer 37 (L) >60 mL/min   Anion gap 9 5 - 15  Glucose, capillary   Collection Time: 01/29/20  7:43 AM  Result Value Ref Range   Glucose-Capillary 64 (L) 70 - 99 mg/dL  Glucose, capillary   Collection Time: 01/29/20  8:42 AM  Result Value Ref Range   Glucose-Capillary 126 (H) 70 - 99 mg/dL  Glucose, capillary   Collection Time: 01/29/20 12:16 PM  Result Value Ref Range   Glucose-Capillary 84 70 - 99 mg/dL   NM Pulmonary Perfusion  Result Date: 01/27/2020 CLINICAL DATA:  Shortness of breath with elevated D-dimer EXAM: NUCLEAR MEDICINE PERFUSION LUNG SCAN TECHNIQUE: Perfusion images were obtained in multiple projections after  intravenous injection of radiopharmaceutical. Ventilation scans intentionally deferred if perfusion scan and chest x-ray adequate for interpretation during COVID 19 epidemic. RADIOPHARMACEUTICALS:  1.7 mCi Tc-43m MAA IV COMPARISON:  Chest x-ray 01/27/2020 FINDINGS: Perfusion only imaging of the lungs demonstrates homogeneous uptake without segmental defect. IMPRESSION: Low probability for pulmonary embolism. Electronically Signed   By: Davina Poke D.O.   On: 01/27/2020 16:22   US RENAL  Result Date: 01/27/2020 CLINICAL DATA:  acute kidney injury EXAM: RENAL / URINARY TRACT ULTRASOUND COMPLETE COMPARISON:  None. FINDINGS: Right Kidney: Renal measurements: 10.6 x 6.6 x 3.3 cm = volume: 80 mL. There is a cyst measuring 6.3 x 5.7 x 6.2 cm. Left Kidney: Not visualized due to body habitus. Patient was also unable to move. Bladder: Appears normal for degree of bladder distention. Other: None. IMPRESSION: 1. No right hydronephrosis. 2. Left kidney not visualized due to body habitus. Electronically Signed   By: Ulyses Jarred M.D.   On: 01/27/2020 20:49   MR ANKLE LEFT WO CONTRAST  Result Date: 12/31/2019 CLINICAL DATA:  Left ankle pain, remote history of midfoot and hindfoot arthrodesis EXAM: MRI OF THE LEFT ANKLE WITHOUT CONTRAST TECHNIQUE: Multiplanar, multisequence MR imaging of the ankle was performed. No intravenous contrast was administered. COMPARISON:  X-ray 12/30/2018 FINDINGS: TENDONS Peroneal: Intact peroneus longus and peroneus brevis tendons. Posteromedial: Intact tibialis posterior, flexor hallucis longus and flexor digitorum longus tendons. Anterior: Intact tibialis anterior, extensor hallucis longus and extensor digitorum longus tendons. Achilles: Intact. Plantar Fascia: Intact. LIGAMENTS Lateral: Anterior talofibular ligament is not visualized, likely chronically torn. Posterior talofibular and calcaneofibular ligaments appear intact. Anterior and posterior tibiofibular ligaments grossly intact.  Medial: Regional artifact and degenerative changes slightly degrade evaluation. No evidence of acute medial ligamentous injury. CARTILAGE Ankle Joint: High-grade chondral loss at the anterior tibiotalar joint with subchondral marrow signal changes and osteophytic spurring. No talar dome osteochondral lesion. Trace joint effusion and synovitis. Subtalar Joints/Sinus Tarsi: Prior subtalar arthrodesis with solid fusion. Bones: Postsurgical changes from midfoot arthrodesis with solid fusion at the subtalar joint, calcaneocuboid joint, and talonavicular joint. Prior second and third TMT joint arthrodesis with solid fusion at the second TMT joint. No definite third TMT joint bony fusion. No acute fracture. 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 Other: Diffuse subcutaneous edema of the visualized lower leg without a well-defined fluid collection. IMPRESSION: 1. Diffuse subcutaneous edema of the visualized lower leg without a well-defined fluid collection. 2. 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 4. Postsurgical  changes from midfoot arthrodesis with solid fusion of the subtalar joint, calcaneocuboid joint, and talonavicular joint. Electronically Signed   By: Davina Poke D.O.   On: 12/31/2019 15:04   DG Chest Port 1 View  Result Date: 01/27/2020 CLINICAL DATA:  Dyspnea. EXAM: PORTABLE CHEST 1 VIEW COMPARISON:  12/30/2019 FINDINGS: The heart size and mediastinal contours are within normal limits. Both lungs are clear. The visualized skeletal structures are unremarkable. IMPRESSION: Negative one-view chest x-ray Electronically Signed   By: San Morelle M.D.   On: 01/27/2020 10:57   VAS [place] LOWER EXTREMITY VENOUS (DVT)  Result Date: 01/29/2020  Lower Venous DVTStudy Indications: SOB.  Risk Factors: None identified. Limitations: Body habitus, poor ultrasound/tissue interface and patient  positioning, patient pain tolerance. Comparison Study: No prior studies. Performing Technologist: Oliver Hum RVT  Examination Guidelines: A complete evaluation includes B-mode imaging, spectral Doppler, color Doppler, and power Doppler as needed of all accessible portions of each vessel. Bilateral testing is considered an integral part of a complete examination. Limited examinations for reoccurring indications may be performed as noted. The reflux portion of the exam is performed with the patient in reverse Trendelenburg.  +---------+---------------+---------+-----------+----------+--------------+ RIGHT    CompressibilityPhasicitySpontaneityPropertiesThrombus Aging +---------+---------------+---------+-----------+----------+--------------+ CFV      Full           Yes      Yes                                 +---------+---------------+---------+-----------+----------+--------------+ SFJ      Full                                                        +---------+---------------+---------+-----------+----------+--------------+ FV Prox  Full                                                        +---------+---------------+---------+-----------+----------+--------------+ FV Mid                  Yes      Yes                                 +---------+---------------+---------+-----------+----------+--------------+ FV Distal               Yes      Yes                                 +---------+---------------+---------+-----------+----------+--------------+ PFV      Full                                                        +---------+---------------+---------+-----------+----------+--------------+ POP      Full           Yes      Yes                                 +---------+---------------+---------+-----------+----------+--------------+  PTV      Full                                                         +---------+---------------+---------+-----------+----------+--------------+ PERO                                                  Not visualized +---------+---------------+---------+-----------+----------+--------------+   +---------+---------------+---------+-----------+----------+--------------+ LEFT     CompressibilityPhasicitySpontaneityPropertiesThrombus Aging +---------+---------------+---------+-----------+----------+--------------+ CFV      Full           Yes      Yes                                 +---------+---------------+---------+-----------+----------+--------------+ SFJ      Full                                                        +---------+---------------+---------+-----------+----------+--------------+ FV Prox  Full                                                        +---------+---------------+---------+-----------+----------+--------------+ FV Mid                  Yes      Yes                                 +---------+---------------+---------+-----------+----------+--------------+ FV Distal               Yes      Yes                                 +---------+---------------+---------+-----------+----------+--------------+ PFV      Full                                                        +---------+---------------+---------+-----------+----------+--------------+ POP      Full           Yes      Yes                                 +---------+---------------+---------+-----------+----------+--------------+ PTV      Full                                                        +---------+---------------+---------+-----------+----------+--------------+  PERO                                                  Not visualized +---------+---------------+---------+-----------+----------+--------------+     Summary: RIGHT: - There is no evidence of deep vein thrombosis in the lower extremity. However, portions of this  examination were limited- see technologist comments above.  - No cystic structure found in the popliteal fossa.  LEFT: - There is no evidence of deep vein thrombosis in the lower extremity. However, portions of this examination were limited- see technologist comments above.  - No cystic structure found in the popliteal fossa.  *See table(s) above for measurements and observations.    Preliminary    Discharge Instructions: Discharge Instructions    Diet - low sodium heart healthy   Complete by: As directed    Discharge instructions   Complete by: As directed    You were hospitalized for UTI. Thank you for allowing [place] to be part of your care.   Please arrange follow up at your primary care physician within 1 week.   Please note these changes made to your medications:   Please START taking:  Cefdinir 300 mg twice daily for 3 days.  Please STOP taking:  Oxybutynin  Please make sure to follow up with your doctor  Please call our clinic if you have any questions or concerns, we may be able to help and keep you from a long and expensive emergency room wait. Our clinic and after hours phone number is [phone], the best time to call is Monday through Friday 9 am to 4 pm but there is always someone available 24/7 if you have an emergency. If you need medication refills please notify your pharmacy one week in advance and they will send [place] a request.   Increase activity slowly   Complete by: As directed       Signed: Al Decant, MD 01/29/2020, 2:52 PM   Pager: [phone]

## 2020-01-29 NOTE — Progress Notes (Signed)
CBG this AM 64 - patient asymptomatic; received Dextrose 50 % 25 ml per SO. Will continue to monitor.

## 2020-01-29 NOTE — Progress Notes (Signed)
HD#1 Subjective:  Overnight Events: no events overnight.   Alisha [name] is feeling much better today. She is alert and oriented and hoping to go back to her SNF today. She denies dysuria. Her SOB has resolved. She concerned about her rash.   She does endorse radiating electric leg pain. This has been chronic and seems to be random. She did notice swelling in her right ankle.    Objective:  Vital signs in last 24 hours: Vitals:   01/28/20 2050 01/28/20 2052 01/28/20 2215 01/29/20 0522  BP:  (!) 120/59  138/75  Pulse: 76  75 80  Resp: 17 20 17 17   Temp:  (!) 97.5 F (36.4 C)  97.8 F (36.6 C)  TempSrc:  Oral  Oral  SpO2: 100% 91% 98%   Weight:      Height:       Supplemental O2: Nasal Cannula SpO2: 98 % O2 Flow Rate (L/min): 1 L/min   Physical Exam:  Physical Exam  Constitutional: She is oriented to person, place, and time and well-developed, well-nourished, and in no distress.  HENT:  Head: Normocephalic and atraumatic.  Eyes: EOM are normal.  Neck: No tracheal deviation present.  Cardiovascular: Normal rate and intact distal pulses. Exam reveals no gallop and no friction rub.  No murmur heard. Pulmonary/Chest: Effort normal and breath sounds normal. No respiratory distress.  Abdominal: Soft. Bowel sounds are normal. She exhibits no distension. There is no abdominal tenderness.  Musculoskeletal:        General: Tenderness (knee pain) present. No edema. Normal range of motion.  Neurological: She is alert and oriented to person, place, and time.  Skin: Skin is warm and dry. Rash (arms and legs) noted.    Filed Weights   01/27/20 1300  Weight: 118.8 kg     Intake/Output Summary (Last 24 hours) at 01/29/2020 [id number] Last data filed at 01/29/2020 0300 Gross per 24 hour  Intake 3342.45 ml  Output 600 ml  Net 2742.45 ml   Net IO Since Admission: 3,337.18 mL [01/29/20 0621]  Pertinent Labs: CBC Latest Ref Rng & Units 01/28/2020 01/27/2020 01/27/2020  WBC  4.0 - 10.5 K/uL 8.2 8.0 -  Hemoglobin 12.0 - 15.0 g/dL 10.5(L) 11.3(L) 10.2(L)  Hematocrit 36.0 - 46.0 % 32.3(L) 35.4(L) 30.0(L)  Platelets 150 - 400 K/uL 321 386 -    CMP Latest Ref Rng & Units 01/28/2020 01/27/2020 01/27/2020  Glucose 70 - 99 mg/dL 87 94 -  BUN 8 - 23 mg/dL 34(H) 33(H) -  Creatinine 0.44 - 1.00 mg/dL 2.21(H) 2.64(H) -  Sodium 135 - 145 mmol/L 138 137 138  Potassium 3.5 - 5.1 mmol/L 4.8 4.4 4.2  Chloride 98 - 111 mmol/L 110 105 -  CO2 22 - 32 mmol/L 16(L) 16(L) -  Calcium 8.9 - 10.3 mg/dL 9.1 9.3 -  Total Protein 6.5 - 8.1 g/dL 7.5 7.7 -  Total Bilirubin 0.3 - 1.2 mg/dL 0.7 1.1 -  Alkaline Phos 38 - 126 U/L 88 94 -  AST 15 - 41 U/L 26 34 -  ALT 0 - 44 U/L 23 24 -    Pending Labs: none  Imaging: No results found.  Assessment/Plan:   Active Problems:   AKI (acute kidney injury) ([place])   Acute hypoxemic respiratory failure (HCC)   Pressure injury of skin    has a past medical history of AKI (acute kidney injury) ([place]), Arthritis, Asthma, Bipolar disorder ([place]), CAD (coronary artery disease) (07/07/2016), Cancer ([place]), Depression, Fibromyalgia,  GERD (gastroesophageal reflux disease), History of benign pituitary tumor, History of kidney stones, Hyperlipidemia, Hypertension, Migraine, Morbid obesity ([place]), Peripheral vascular disease ([place]), Pneumonia, Seizures ([place]), Sleep apnea, and Vertigo.  Patient Summary: Alisha [name] is a 69 y.o. with a pertinent PMH of Asthma, CAD (s/p stent), HTN, OSA, CVA, subjective history of DVT, who presented with hypoxia and AMS and admitted for further evaluation and management of hypoxia and acute encephalopathy. He/she is on hospital day 1 and is stable.  #Acute Kidney Injury: #Urinary tract infection:  - On day 2 of ceftriaxone for UTI and will switch to Cefdinir 300 mg bid for the remainder of the 5 day course.  - Patient is much improved and appears to be at baseline.  #Rash - Patients rash has slightly  improved. She will likely need follow up with dermatology for biopsy of the rash.  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  - Rash improved with prednisone - Patient had D-dimmer elevated concern for rheumatologic/inflammatory process - Continue benadryl as needed.  #Diabetes mellitus: - continue SSI  Diet: Normal IVF: LR,125cc/hr VTE: Heparin Code: Full PT/OT recs: SNF for Subacute PT TOC recs: pending  Dispo: Anticipated discharge today.    Marianna Payment, D.O. [facility], PGY-1 Date 01/29/2020 Time 6:21 AM Pager: [phone]

## 2020-01-29 NOTE — Progress Notes (Addendum)
Patient was discharged to nursing home ([facility]) by MD order; discharged instructions review and sent to facility with care notes; IV DIC; skin - generalized rash; stg 2 on left buttock; Facility was called for report multiple times with no results; no staff available to take report. Patient will be transported to facility via [place]. Patient's daughter is aware about patient's D/C.

## 2020-01-29 NOTE — TOC Initial Note (Signed)
Transition of Care [facility]) - Initial/Assessment Note    Patient Details  Name: Alisha [name] MRN: [id number] Date of Birth: Jan 01, 1951  Transition of Care [facility]) CM/SW Contact:    Benard Halsted, LCSW Phone Number: 01/29/2020, 3:04 PM  Clinical Narrative:                 CSW spoke with patient's daughter to make her ware of patient's discharge back to [facility] today. She is accepting of plan and requests a call from the MD for a medical update. MD aware. CSW will arrange PTAR.   Expected Discharge Plan: Skilled Nursing Facility Barriers to Discharge: No Barriers Identified   Patient Goals and CMS Choice Patient states their goals for this hospitalization and ongoing recovery are:: Return to SNF CMS Medicare.gov Compare Post Acute Care list provided to:: Patient(and daughter) Choice offered to / list presented to : Patient, Adult Children  Expected Discharge Plan and Services Expected Discharge Plan: [place] In-house Referral: Clinical Social Work   Post Acute Care Choice: [place] Living arrangements for the past 2 months: [place] Expected Discharge Date: 01/29/20                                    Prior Living Arrangements/Services Living arrangements for the past 2 months: [place] Lives with:: Facility Resident Patient language and need for interpreter reviewed:: Yes Do you feel safe going back to the place where you live?: Yes      Need for Family Participation in Patient Care: Yes (Comment) Care giver support system in place?: Yes (comment)   Criminal Activity/Legal Involvement Pertinent to Current Situation/Hospitalization: No - Comment as needed  Activities of Daily Living Home Assistive Devices/Equipment: None ADL Screening (condition at time of admission) Patient's cognitive ability adequate to safely complete daily activities?: No Is the patient deaf or have  difficulty hearing?: No Does the patient have difficulty seeing, even when wearing glasses/contacts?: No Does the patient have difficulty concentrating, remembering, or making decisions?: Yes Patient able to express need for assistance with ADLs?: Yes Does the patient have difficulty dressing or bathing?: Yes Independently performs ADLs?: Yes (appropriate for developmental age) Does the patient have difficulty walking or climbing stairs?: No Weakness of Legs: Both Weakness of Arms/Hands: Both  Permission Sought/Granted Permission sought to share information with : Facility Sport and exercise psychologist, Family Supports Permission granted to share information with : Yes, Verbal Permission Granted  Share Information with NAME: [name]  Permission granted to share info w AGENCY: [place] granted to share info w Relationship: Daughter  Permission granted to share info w Contact Information: [phone]  Emotional Assessment Appearance:: Appears stated age Attitude/Demeanor/Rapport: Engaged Affect (typically observed): Accepting, Appropriate Orientation: : Oriented to Self, Oriented to Place, Oriented to Situation Alcohol / Substance Use: Not Applicable Psych Involvement: No (comment)  Admission diagnosis:  Rash [R21] Hypoxia [R09.02] AKI (acute kidney injury) ([place]) [N17.9] Acute hypoxemic respiratory failure ([place]) [J96.01] Patient Active Problem List   Diagnosis Date Noted  . Pressure injury of skin 01/28/2020  . Acute hypoxemic respiratory failure ([place]) 01/27/2020  . Submandibular gland infection   . Urinary retention   . Cerebrovascular accident (CVA) ([place])   . Altered mental status   . Fall   . AKI (acute kidney injury) ([place]) 11/03/2019  . Recurrent falls 09/22/2018  . Chronic use of opiate drug for therapeutic purpose  04/11/2018  . Bradycardia 04/11/2018  . Chronic fatigue 04/11/2018  . Prediabetes 12/17/2017  . Mild cognitive impairment 12/13/2017  .  Cough variant asthma vs UACS 10/26/2017  . Upper airway cough syndrome 10/25/2017  . Medial epicondylitis, right 09/16/2017  . Right shoulder pain 09/07/2017  . Lateral epicondylitis of right elbow 09/07/2017  . Vitamin D deficiency 07/13/2017  . History of foot fracture 07/12/2017  . History of colon polyps 07/12/2017  . Cataract of both eyes 07/11/2017  . Hypertensive retinopathy of both eyes 07/11/2017  . Fibromyalgia 05/17/2017  . Tenosynovitis, wrist 05/14/2017  . Chronic cough 05/02/2017  . Foot pain, left 05/02/2017  . Dysphagia 03/23/2017  . Health care maintenance 03/23/2017  . Coronary artery disease involving native coronary artery of native heart without angina pectoris 01/11/2017  . Hyperlipidemia 01/11/2017  . Polypharmacy 11/25/2016  . Venous stasis of both lower extremities 10/04/2016  . Poor dentition 10/04/2016  . OSA (obstructive sleep apnea) 10/04/2016  . History of nephrolithiasis 10/04/2016  . Hx of pilonidal cyst 10/04/2016  . Bipolar depression ([place]) 10/04/2016  . Personal history of DVT (deep vein thrombosis) 10/04/2016  . Morbid obesity due to excess calories ([place]) 09/25/2016  . History of pituitary adenoma s/p resection 09/25/2016  . Essential hypertension 09/25/2016  . Psoriatic arthritis ([place]) 09/25/2016  . Vision impairment 09/25/2016  . Chronic seasonal allergic rhinitis due to pollen 09/25/2016  . Moderate persistent asthma without complication [id number]  . Gastroesophageal reflux disease without esophagitis 09/25/2016  . Urge incontinence 09/25/2016  . Chronic pain syndrome 09/25/2016  . Candidal intertrigo 05/10/2010   PCP:  Sonia Side., FNP Pharmacy:   [id number] - [address] [place] [address] Phone: [phone] Fax: [phone]     Social Determinants of Health (SDOH) Interventions    Readmission Risk Interventions Readmission Risk Prevention Plan 01/29/2020  12/08/2019  Transportation Screening Complete Complete  PCP or Specialist Appt within 5-7 Days - Not Complete  Not Complete comments - plan for SNF  PCP or Specialist Appt within 3-5 Days Complete -  Home Care Screening - Complete  Medication Review (RN CM) - Referral to Pharmacy  [place] or Home Care Consult Complete -  Social Work Consult for [place] Planning/Counseling Complete -  [place] Screening Not Applicable -  Medication Review (RN Care Manager) Complete -  Some recent data might be hidden

## 2020-01-29 NOTE — Progress Notes (Signed)
CBG 126  

## 2020-01-29 NOTE — Progress Notes (Signed)
SATURATION QUALIFICATIONS: (This note is used to comply with regulatory documentation for home oxygen)  Patient Saturations on Room Air at Rest = 100%  Patient Saturations on Room Air while Ambulating = 94%  Patient Saturations on 2 Liters of oxygen while Ambulating = 96%  Please briefly explain why patient needs home oxygen:

## 2020-01-29 NOTE — NC FL2 (Signed)
[place] MEDICAID FL2 LEVEL OF CARE SCREENING TOOL     IDENTIFICATION  Patient Name: Alisha [name] Birthdate: [birth year] Sex: female Admission Date (Current Location): 01/27/2020  [facility] and [place] Number:  Herbalist and Address:  [facility], [place] [address], [place],  [address]      Provider Number: [id number]  Attending Physician Name and Address:  Aldine Contes, MD  Relative Name and Phone Number:  [name], daughter, [phone]    Current Level of Care: Hospital Recommended Level of Care: [place] Prior Approval Number:    Date Approved/Denied:   PASRR Number: [id number]  Discharge Plan: SNF    Current Diagnoses: Patient Active Problem List   Diagnosis Date Noted  . Pressure injury of skin 01/28/2020  . Acute hypoxemic respiratory failure ([place]) 01/27/2020  . Submandibular gland infection   . Urinary retention   . Cerebrovascular accident (CVA) ([place])   . Altered mental status   . Fall   . AKI (acute kidney injury) ([place]) 11/03/2019  . Recurrent falls 09/22/2018  . Chronic use of opiate drug for therapeutic purpose 04/11/2018  . Bradycardia 04/11/2018  . Chronic fatigue 04/11/2018  . Prediabetes 12/17/2017  . Mild cognitive impairment 12/13/2017  . Cough variant asthma vs UACS 10/26/2017  . Upper airway cough syndrome 10/25/2017  . Medial epicondylitis, right 09/16/2017  . Right shoulder pain 09/07/2017  . Lateral epicondylitis of right elbow 09/07/2017  . Vitamin D deficiency 07/13/2017  . History of foot fracture 07/12/2017  . History of colon polyps 07/12/2017  . Cataract of both eyes 07/11/2017  . Hypertensive retinopathy of both eyes 07/11/2017  . Fibromyalgia 05/17/2017  . Tenosynovitis, wrist 05/14/2017  . Chronic cough 05/02/2017  . Foot pain, left 05/02/2017  . Dysphagia 03/23/2017  . Health care maintenance 03/23/2017  . Coronary artery disease involving native  coronary artery of native heart without angina pectoris 01/11/2017  . Hyperlipidemia 01/11/2017  . Polypharmacy 11/25/2016  . Venous stasis of both lower extremities 10/04/2016  . Poor dentition 10/04/2016  . OSA (obstructive sleep apnea) 10/04/2016  . History of nephrolithiasis 10/04/2016  . Hx of pilonidal cyst 10/04/2016  . Bipolar depression ([place]) 10/04/2016  . Personal history of DVT (deep vein thrombosis) 10/04/2016  . Morbid obesity due to excess calories ([place]) 09/25/2016  . History of pituitary adenoma s/p resection 09/25/2016  . Essential hypertension 09/25/2016  . Psoriatic arthritis ([place]) 09/25/2016  . Vision impairment 09/25/2016  . Chronic seasonal allergic rhinitis due to pollen 09/25/2016  . Moderate persistent asthma without complication 123XX123  . Gastroesophageal reflux disease without esophagitis 09/25/2016  . Urge incontinence 09/25/2016  . Chronic pain syndrome 09/25/2016  . Candidal intertrigo 05/10/2010    Orientation RESPIRATION BLADDER Height & Weight     Self, Place  O2(4L nasal cannula; CPAP nightly) Incontinent, External catheter Weight: 262 lb (118.8 kg) Height:  5\' 6"  (167.6 cm)  BEHAVIORAL SYMPTOMS/MOOD NEUROLOGICAL BOWEL NUTRITION STATUS      Continent Diet(Please see DC Summary)  AMBULATORY STATUS COMMUNICATION OF NEEDS Skin   Extensive Assist Verbally PU Stage and Appropriate Care(Stage II on sacrum)                       Personal Care Assistance Level of Assistance  Bathing, Feeding, Dressing Bathing Assistance: Maximum assistance Feeding assistance: Limited assistance Dressing Assistance: Maximum assistance     Functional Limitations Info  Sight, Hearing, Speech Sight Info: Adequate Hearing Info: Adequate  Speech Info: Adequate    SPECIAL CARE FACTORS FREQUENCY                       Contractures Contractures Info: Not present    Additional Factors Info  Code Status, Allergies, Insulin Sliding Scale Code Status  Info: Full Allergies Info: Ibuprofen, Ampicillin, Asa (Aspirin), Darvon (Propoxyphene), Demeclocycline, Eggs Or Egg-derived Products, Lactalbumin, Lisinopril, Milk-related Compounds, Salicylates, Tetracycline Hcl, Tetracyclines & Related   Insulin Sliding Scale Info: See DC Summary for dose       Current Medications (01/29/2020):  This is the current hospital active medication list Current Facility-Administered Medications  Medication Dose Route Frequency Provider Last Rate Last Admin  . acetaminophen (TYLENOL) tablet 650 mg  650 mg Oral Q6H PRN Seawell, Jaimie A, DO   650 mg at 01/29/20 0544   Or  . acetaminophen (TYLENOL) suppository 650 mg  650 mg Rectal Q6H PRN Seawell, Jaimie A, DO      . albuterol (PROVENTIL) (2.5 MG/3ML) 0.083% nebulizer solution 2.5 mg  2.5 mg Inhalation Q6H PRN Seawell, Jaimie A, DO      . cefTRIAXone (ROCEPHIN) 1 g in sodium chloride 0.9 % 100 mL IVPB  1 g Intravenous Q24H Marianna Payment, MD 200 mL/hr at 01/29/20 1120 1 g at 01/29/20 1120  . Chlorhexidine Gluconate Cloth 2 % PADS 6 each  6 each Topical Q0600 Aldine Contes, MD   6 each at 01/29/20 0541  . dextrose 50 % solution           . diphenhydrAMINE (BENADRYL) capsule 25 mg  25 mg Oral Q4H PRN Marianna Payment, MD   25 mg at 01/27/20 2225  . gabapentin (NEURONTIN) capsule 200 mg  200 mg Oral TID Seawell, Jaimie A, DO   200 mg at 01/29/20 0855  . heparin injection 5,000 Units  5,000 Units Subcutaneous Q8H Seawell, Jaimie A, DO   5,000 Units at 01/29/20 0536  . mometasone-formoterol (DULERA) 100-5 MCG/ACT inhaler 2 puff  2 puff Inhalation BID Seawell, Jaimie A, DO   2 puff at 01/29/20 0842  . mupirocin ointment (BACTROBAN) 2 % 1 application  1 application Nasal BID Aldine Contes, MD   1 application at [id number]  . ondansetron (ZOFRAN) tablet 4 mg  4 mg Oral Q6H PRN Seawell, Jaimie A, DO       Or  . ondansetron (ZOFRAN) injection 4 mg  4 mg Intravenous Q6H PRN Seawell, Jaimie A, DO      . pantoprazole  (PROTONIX) EC tablet 40 mg  40 mg Oral Daily Seawell, Jaimie A, DO   40 mg at 01/29/20 0855  . senna-docusate (Senokot-S) tablet 1 tablet  1 tablet Oral QHS PRN Seawell, Jaimie A, DO      . topiramate (TOPAMAX) tablet 50 mg  50 mg Oral QHS Seawell, Jaimie A, DO   50 mg at 01/28/20 2106  . umeclidinium bromide (INCRUSE ELLIPTA) 62.5 MCG/INH 1 puff  1 puff Inhalation Daily Seawell, Jaimie A, DO         Discharge Medications: Please see discharge summary for a list of discharge medications.  Relevant Imaging Results:  Relevant Lab Results:   Additional Information SSN: [id number]              COVID negative on 01/27/20  Benard Halsted, LCSW

## 2020-01-29 NOTE — Progress Notes (Signed)
Patient's daughter was called and update on patient's condition.

## 2020-01-29 NOTE — Discharge Instructions (Signed)
You were admitted to the hospital with low oxygen levels and confusion. You were found to have a kidney injury and infection. You were treated with antibiotics for your infection. Your kidney injury improved by holding some of your medications that can affect your kidneys. Your oxygen level improved while you were here with supplemental oxygen. Your confusion improved as well and may have been due to your infection. You were found to have a new rash that improved somewhat with steroids and benadryl. You may need to follow up with a dermatologist about this. Please make an appointment to follow up with your primary care doctor within one week. It was a pleasure taking care of you.

## 2020-01-29 NOTE — Evaluation (Addendum)
Physical Therapy Evaluation Patient Details Name: Maylynn Orzechowski MRN: [id number] DOB: 06-Sep-1950 Today's Date: 01/29/2020   History of Present Illness  Pt adm from SNF with hypoxia and UTI.  PMH - vertigo, sz, PNA, PVD, chronic venous insufficiency, morbid obesity, TKR, Bil ankle fusions, HTN,  fibromyalgia, deprssion, cancer, CAD, bipolar disorder, asthma, arthritis, CAD  Clinical Impression  Pt admitted with above diagnosis and presents to PT with functional limitations due to deficits listed below (See PT problem list). Recommend return to SNF for further rehab.    Follow Up Recommendations SNF    Equipment Recommendations  None recommended by PT    Recommendations for Other Services       Precautions / Restrictions Precautions Precautions: Fall      Mobility  Bed Mobility Overal bed mobility: Needs Assistance Bed Mobility: Supine to Sit     Supine to sit: Min guard;HOB elevated     General bed mobility comments: Inc time and effort  Transfers Overall transfer level: Needs assistance Equipment used: Rolling walker (2 wheeled) Transfers: Sit to/from [facility] Sit to Stand: +2 physical assistance;Min assist Stand pivot transfers: +2 physical assistance;Min assist       General transfer comment: Assist to bring hips up and for balance. Pt initially flexed with forearms resting on walker handles. Small pivotal steps from bed to bsc.   Ambulation/Gait             General Gait Details: Limited to pivotal steps from bed to bsc  Stairs            Wheelchair Mobility    Modified Rankin (Stroke Patients Only)       Balance Overall balance assessment: Needs assistance Sitting-balance support: No upper extremity supported;Feet supported Sitting balance-Leahy Scale: Fair     Standing balance support: Bilateral upper extremity supported Standing balance-Leahy Scale: Poor Standing balance comment: walker and min assist for  static standing                             Pertinent Vitals/Pain Pain Assessment: No/denies pain    Home Living Family/patient expects to be discharged to:: Skilled nursing facility                      Prior Function Level of [place]: Needs assistance   Gait / Transfers Assistance Needed: Several weeks ago during hospitalization pt taking a few steps with walker and assist           Hand Dominance   Dominant Hand: Right    Extremity/Trunk Assessment   Upper Extremity Assessment Upper Extremity Assessment: Generalized weakness    Lower Extremity Assessment Lower Extremity Assessment: Generalized weakness       Communication   Communication: No difficulties  Cognition Arousal/Alertness: Awake/alert Behavior During Therapy: WFL for tasks assessed/performed Overall Cognitive Status: No family/caregiver present to determine baseline cognitive functioning Area of Impairment: Safety/judgement;Memory;Following commands;Problem solving                     Memory: Decreased short-term memory Following Commands: Follows one step commands with increased time Safety/Judgement: Decreased awareness of deficits   Problem Solving: Slow processing;Requires verbal cues        General Comments General comments (skin integrity, edema, etc.): SpO2 100% on RA throughout session    Exercises     Assessment/Plan    PT Assessment All further PT needs can be met in  the next venue of care  PT Problem List Decreased strength;Decreased activity tolerance;Decreased balance;Decreased mobility;Obesity       PT Treatment Interventions      PT Goals (Current goals can be found in the Care Plan section)  Acute Rehab PT Goals Patient Stated Goal: Pt to return to SNF today    Frequency     Barriers to discharge        Co-evaluation               AM-PAC PT "6 Clicks" Mobility  Outcome Measure Help needed turning from your back to your  side while in a flat bed without using bedrails?: A Little Help needed moving from lying on your back to sitting on the side of a flat bed without using bedrails?: A Little Help needed moving to and from a bed to a chair (including a wheelchair)?: A Little Help needed standing up from a chair using your arms (e.g., wheelchair or bedside chair)?: A Little Help needed to walk in hospital room?: Total Help needed climbing 3-5 steps with a railing? : Total 6 Click Score: 14    End of Session Equipment Utilized During Treatment: Gait belt Activity Tolerance: Patient tolerated treatment well Patient left: in chair;with call bell/phone within reach;with chair alarm set Nurse Communication: Mobility status PT Visit Diagnosis: Other abnormalities of gait and mobility (R26.89);Muscle weakness (generalized) (M62.81)    Time: 8270-7867 PT Time Calculation (min) (ACUTE ONLY): 26 min   Charges:   PT Evaluation $PT Eval Moderate Complexity: 1 Mod PT Treatments $Therapeutic Activity: 8-22 mins        [place] Pager [phone] Office [place] 01/29/2020, 2:19 PM

## 2020-01-29 NOTE — Progress Notes (Signed)
Bilateral lower extremity venous duplex has been completed. Preliminary results can be found in CV Proc through chart review.   01/29/20 1:39 PM Carlos Levering RVT

## 2020-01-30 LAB — URINE CULTURE: Culture: >=100000 — AB

## 2020-03-10 IMAGING — CT CT ANGIOGRAPHY CHEST
3 of 7 series · 19 of 36 positions shown · IV contrast (ISOVUE)
Comparison: Radiograph of same day.

CLINICAL DATA: Cough, shortness of breath, fever.

EXAM:
CT ANGIOGRAPHY CHEST WITH CONTRAST
TECHNIQUE: Multidetector CT imaging of the chest was performed using the
standard protocol during bolus administration of intravenous
contrast. Multiplanar CT image reconstructions and MIPs were
obtained to evaluate the vascular anatomy.
CONTRAST:  100mL OMNIPAQUE IOHEXOL 350 MG/ML SOLN

[Series 5: thins · axial · 0.96mm/px · z∈[+1508,+1738]mm · 14 of 265 slices shown]
[im 18/265  lung]
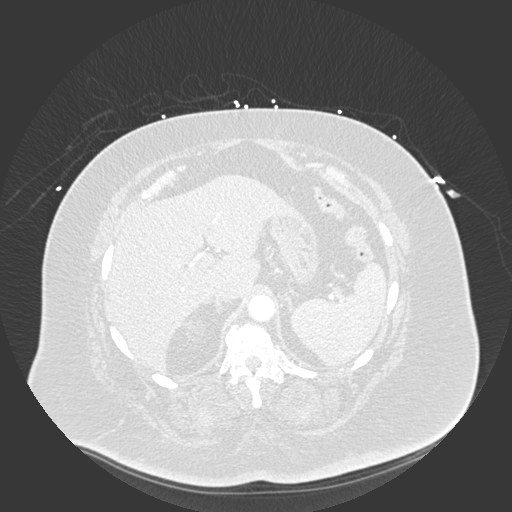
[im 36/265  mediastinal]
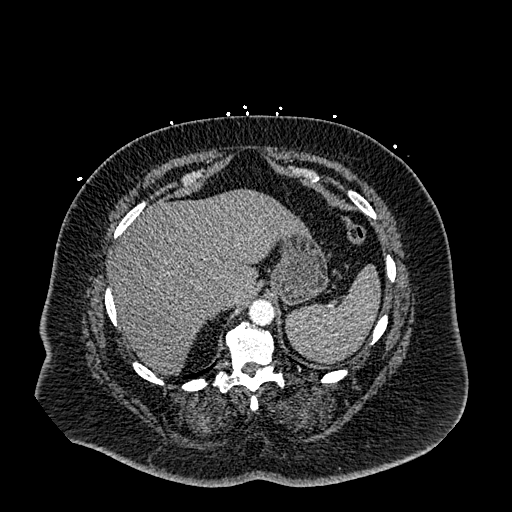
[im 53/265  lung]
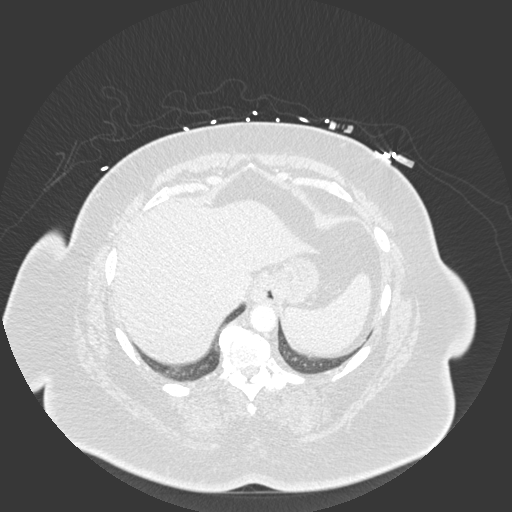
[im 71/265  mediastinal]
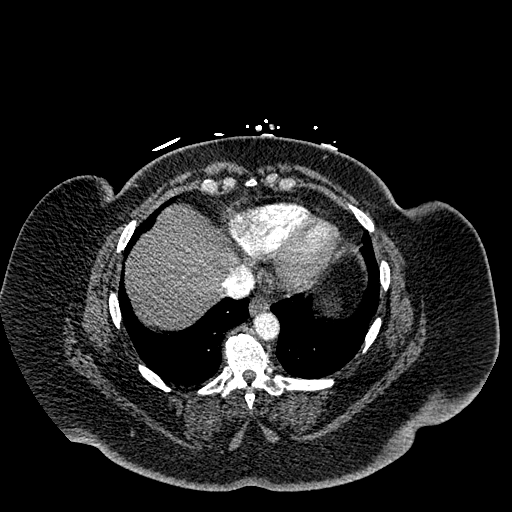
[im 89/265  lung]
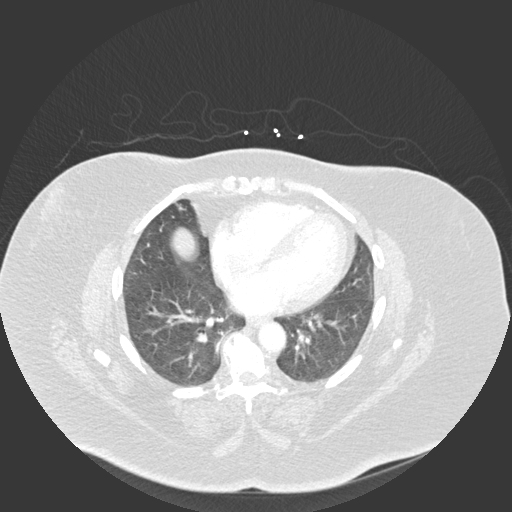
[im 106/265  mediastinal]
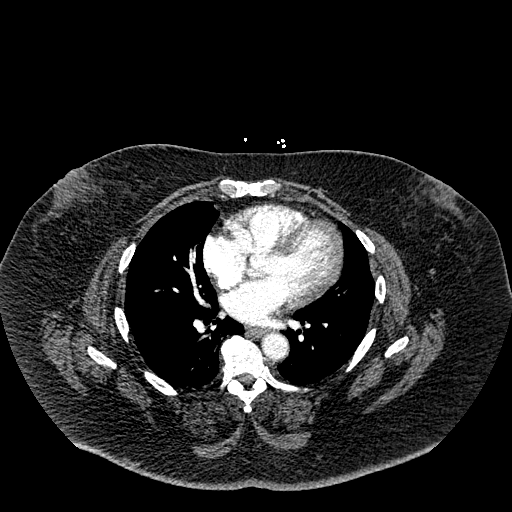
[im 124/265  lung]
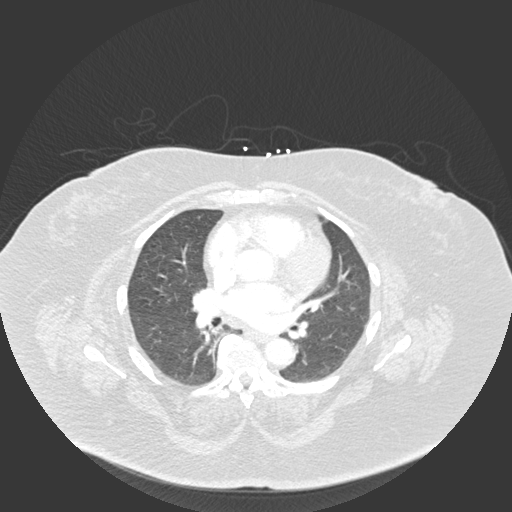
[im 141/265  mediastinal]
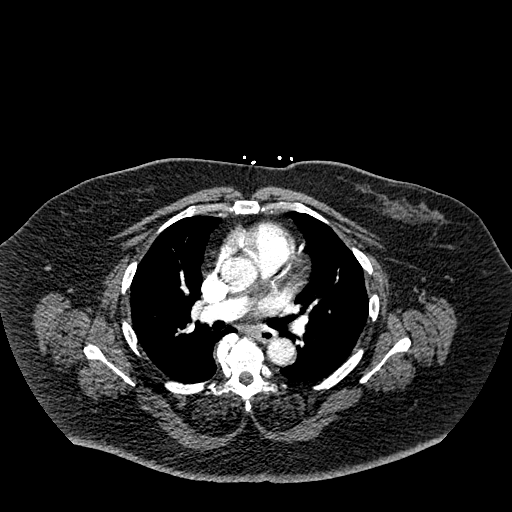
[im 159/265  lung]
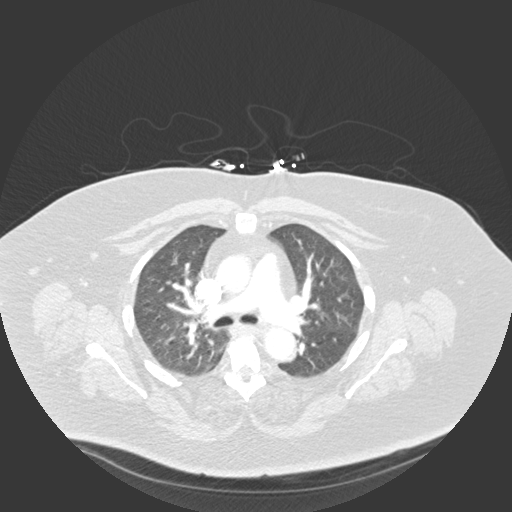
[im 177/265  mediastinal]
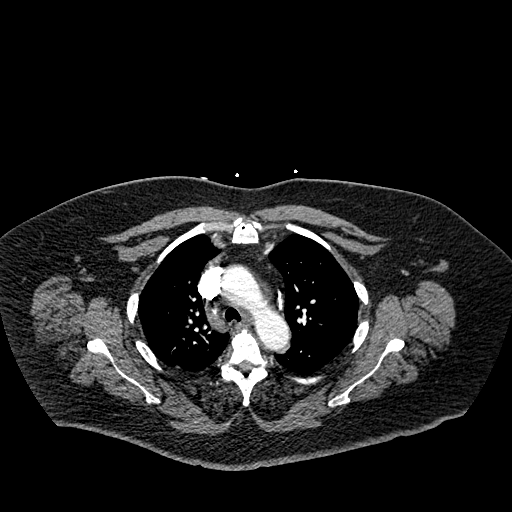
[im 194/265  lung]
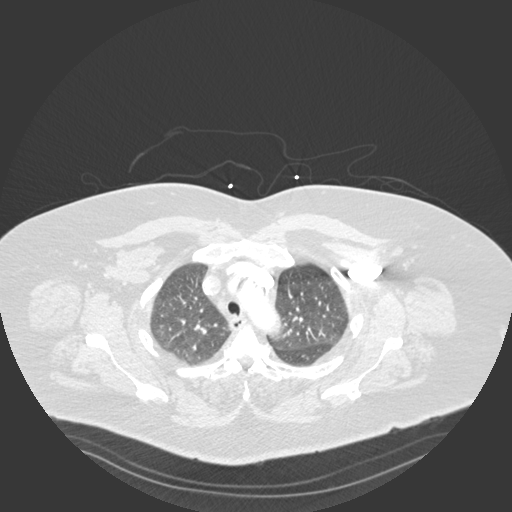
[im 212/265  mediastinal]
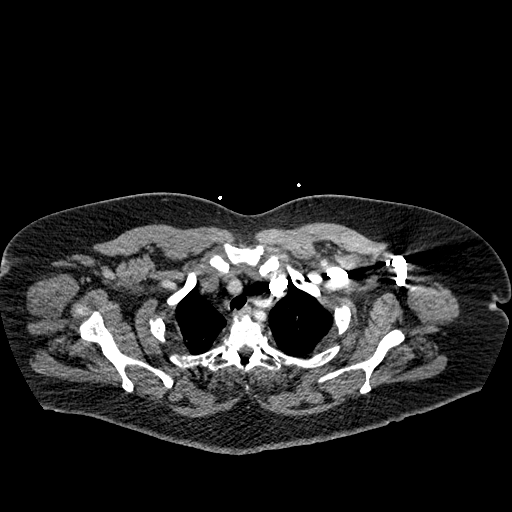
[im 229/265  lung]
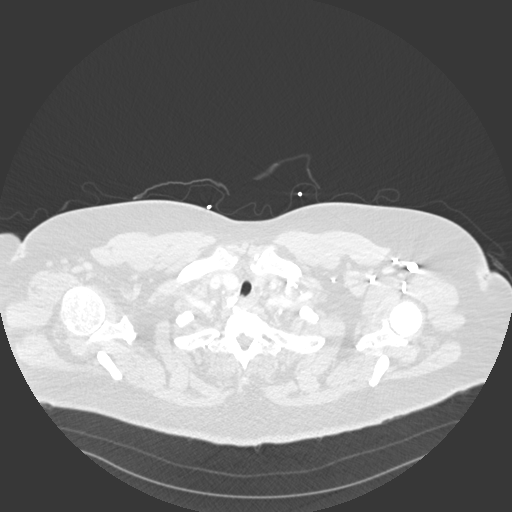
[im 247/265  mediastinal]
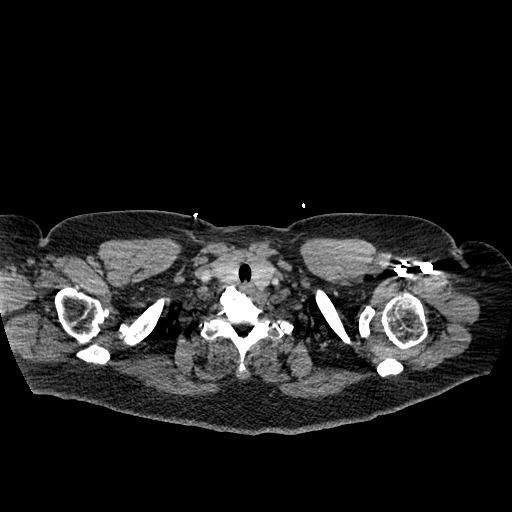

[Series 6: coronal mpr · coronal · 0.52mm/px · 1 of 184 slices shown]
[im 92/184  mediastinal]
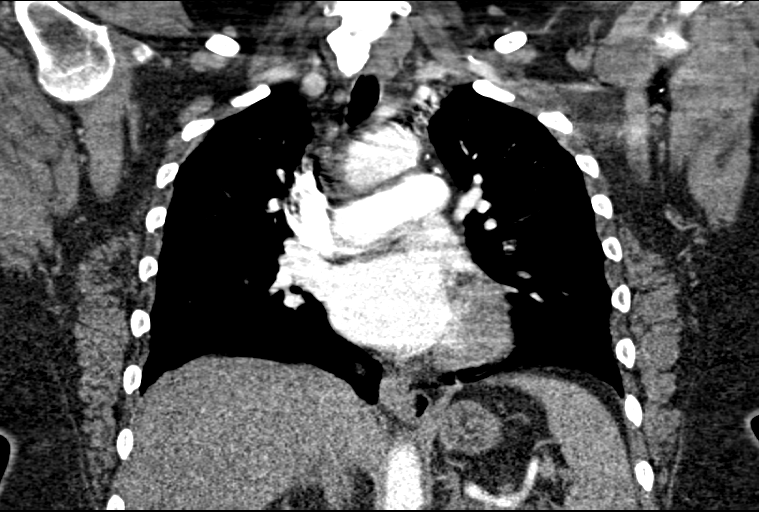

[Series 10: lung · axial · 0.96mm/px · z∈[+1556,+1676]mm · 4 of 121 slices shown]
[im 21/121  mediastinal]
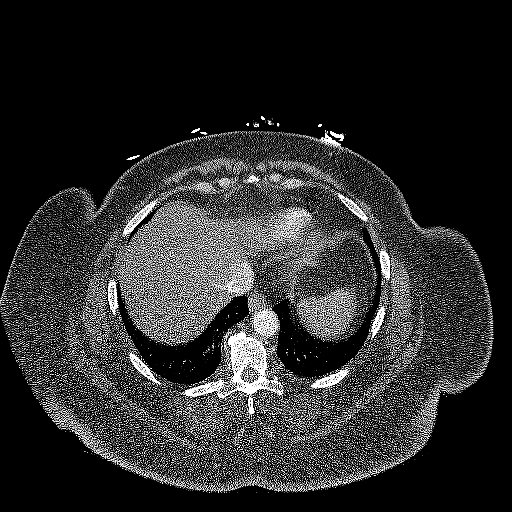
[im 41/121  mediastinal]
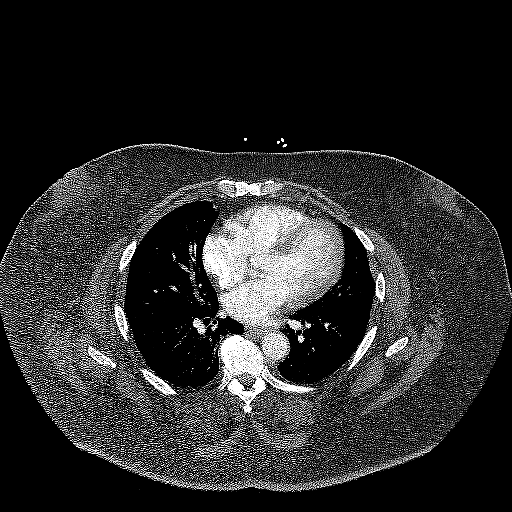
[im 61/121  mediastinal]
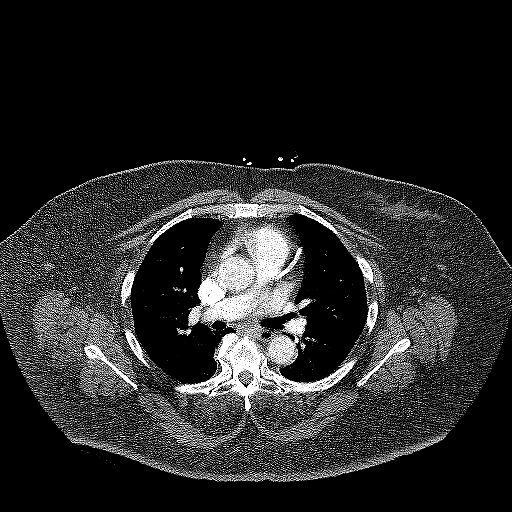
[im 81/121  mediastinal]
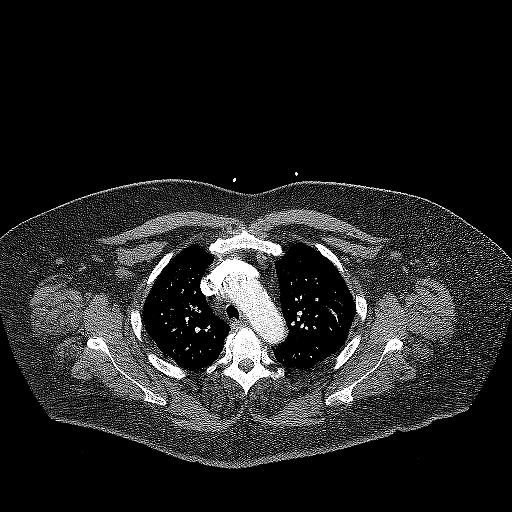

[19 of 36 positions shown; findings below may reference images not displayed]

FINDINGS: Cardiovascular: Satisfactory opacification of the pulmonary arteries
to the segmental level. No evidence of pulmonary embolism. Normal
heart size. No pericardial effusion. Coronary artery stent is noted.

Mediastinum/Nodes: No enlarged mediastinal, hilar, or axillary lymph
nodes. Thyroid gland, trachea, and esophagus demonstrate no
significant findings.

Lungs/Pleura: Lungs are clear. No pleural effusion or pneumothorax.

Upper Abdomen: No acute abnormality.

Musculoskeletal: No chest wall abnormality. No acute or significant
osseous findings.

Review of the MIP images confirms the above findings.
IMPRESSION: No definite evidence of pulmonary embolus. No acute abnormality seen
in the chest.
